# Patient Record
Sex: Male | Born: 1955 | Race: Black or African American | Hispanic: No | Marital: Married | Smoking: Former smoker
Health system: Southern US, Community
[De-identification: ages and names within clinical notes are randomized; demographics above are authoritative.]

## PROBLEM LIST (undated history)

## (undated) DIAGNOSIS — K2270 Barrett's esophagus without dysplasia: Secondary | ICD-10-CM

## (undated) DIAGNOSIS — E559 Vitamin D deficiency, unspecified: Secondary | ICD-10-CM

## (undated) DIAGNOSIS — H269 Unspecified cataract: Secondary | ICD-10-CM

## (undated) DIAGNOSIS — F419 Anxiety disorder, unspecified: Secondary | ICD-10-CM

## (undated) DIAGNOSIS — R7303 Prediabetes: Secondary | ICD-10-CM

## (undated) DIAGNOSIS — T7840XA Allergy, unspecified, initial encounter: Secondary | ICD-10-CM

## (undated) DIAGNOSIS — I10 Essential (primary) hypertension: Secondary | ICD-10-CM

## (undated) DIAGNOSIS — Z8719 Personal history of other diseases of the digestive system: Secondary | ICD-10-CM

## (undated) DIAGNOSIS — J45909 Unspecified asthma, uncomplicated: Secondary | ICD-10-CM

## (undated) DIAGNOSIS — M109 Gout, unspecified: Secondary | ICD-10-CM

## (undated) DIAGNOSIS — E785 Hyperlipidemia, unspecified: Secondary | ICD-10-CM

## (undated) DIAGNOSIS — K219 Gastro-esophageal reflux disease without esophagitis: Secondary | ICD-10-CM

## (undated) DIAGNOSIS — G4730 Sleep apnea, unspecified: Secondary | ICD-10-CM

## (undated) HISTORY — DX: Unspecified asthma, uncomplicated: J45.909

## (undated) HISTORY — DX: Allergy, unspecified, initial encounter: T78.40XA

## (undated) HISTORY — PX: OTHER SURGICAL HISTORY: SHX169

## (undated) HISTORY — DX: Gastro-esophageal reflux disease without esophagitis: K21.9

## (undated) HISTORY — DX: Prediabetes: R73.03

## (undated) HISTORY — DX: Vitamin D deficiency, unspecified: E55.9

## (undated) HISTORY — DX: Unspecified cataract: H26.9

## (undated) HISTORY — PX: UPPER GASTROINTESTINAL ENDOSCOPY: SHX188

## (undated) HISTORY — DX: Hyperlipidemia, unspecified: E78.5

## (undated) HISTORY — DX: Gout, unspecified: M10.9

## (undated) HISTORY — DX: Essential (primary) hypertension: I10

## (undated) HISTORY — DX: Barrett's esophagus without dysplasia: K22.70

## (undated) HISTORY — DX: Sleep apnea, unspecified: G47.30

## (undated) HISTORY — PX: COLONOSCOPY: SHX174

---

## 1978-09-01 HISTORY — PX: HEMORRHOID SURGERY: SHX153

## 1998-02-22 ENCOUNTER — Ambulatory Visit (HOSPITAL_COMMUNITY): Admission: RE | Admit: 1998-02-22 | Discharge: 1998-02-22 | Payer: Self-pay | Admitting: Internal Medicine

## 1999-04-02 ENCOUNTER — Ambulatory Visit (HOSPITAL_COMMUNITY): Admission: RE | Admit: 1999-04-02 | Discharge: 1999-04-02 | Payer: Self-pay | Admitting: Internal Medicine

## 1999-04-02 ENCOUNTER — Encounter: Payer: Self-pay | Admitting: Internal Medicine

## 1999-06-06 ENCOUNTER — Ambulatory Visit (HOSPITAL_COMMUNITY): Admission: RE | Admit: 1999-06-06 | Discharge: 1999-06-06 | Payer: Self-pay | Admitting: Gastroenterology

## 2000-04-07 ENCOUNTER — Encounter: Payer: Self-pay | Admitting: Internal Medicine

## 2000-04-07 ENCOUNTER — Ambulatory Visit (HOSPITAL_COMMUNITY): Admission: RE | Admit: 2000-04-07 | Discharge: 2000-04-07 | Payer: Self-pay | Admitting: Internal Medicine

## 2002-04-18 ENCOUNTER — Ambulatory Visit (HOSPITAL_COMMUNITY): Admission: RE | Admit: 2002-04-18 | Discharge: 2002-04-18 | Payer: Self-pay | Admitting: Internal Medicine

## 2002-04-18 ENCOUNTER — Encounter: Payer: Self-pay | Admitting: Internal Medicine

## 2004-04-19 ENCOUNTER — Ambulatory Visit: Admission: RE | Admit: 2004-04-19 | Discharge: 2004-04-19 | Payer: Self-pay | Admitting: Internal Medicine

## 2005-04-25 ENCOUNTER — Ambulatory Visit (HOSPITAL_COMMUNITY): Admission: RE | Admit: 2005-04-25 | Discharge: 2005-04-25 | Payer: Self-pay | Admitting: Internal Medicine

## 2005-11-28 ENCOUNTER — Ambulatory Visit (HOSPITAL_BASED_OUTPATIENT_CLINIC_OR_DEPARTMENT_OTHER): Admission: RE | Admit: 2005-11-28 | Discharge: 2005-11-28 | Payer: Self-pay | Admitting: Urology

## 2005-11-28 ENCOUNTER — Encounter (INDEPENDENT_AMBULATORY_CARE_PROVIDER_SITE_OTHER): Payer: Self-pay | Admitting: Specialist

## 2005-12-16 ENCOUNTER — Ambulatory Visit: Payer: Self-pay | Admitting: Gastroenterology

## 2005-12-30 ENCOUNTER — Ambulatory Visit: Payer: Self-pay | Admitting: Gastroenterology

## 2006-01-09 ENCOUNTER — Encounter (INDEPENDENT_AMBULATORY_CARE_PROVIDER_SITE_OTHER): Payer: Self-pay | Admitting: Specialist

## 2006-01-09 ENCOUNTER — Ambulatory Visit: Payer: Self-pay | Admitting: Gastroenterology

## 2006-05-20 ENCOUNTER — Ambulatory Visit (HOSPITAL_COMMUNITY): Admission: RE | Admit: 2006-05-20 | Discharge: 2006-05-20 | Payer: Self-pay | Admitting: Internal Medicine

## 2007-09-01 ENCOUNTER — Ambulatory Visit (HOSPITAL_COMMUNITY): Admission: RE | Admit: 2007-09-01 | Discharge: 2007-09-01 | Payer: Self-pay | Admitting: Internal Medicine

## 2008-05-31 ENCOUNTER — Ambulatory Visit (HOSPITAL_COMMUNITY): Admission: RE | Admit: 2008-05-31 | Discharge: 2008-05-31 | Payer: Self-pay | Admitting: Internal Medicine

## 2009-01-16 ENCOUNTER — Encounter (INDEPENDENT_AMBULATORY_CARE_PROVIDER_SITE_OTHER): Payer: Self-pay | Admitting: *Deleted

## 2009-06-06 ENCOUNTER — Ambulatory Visit (HOSPITAL_COMMUNITY): Admission: RE | Admit: 2009-06-06 | Discharge: 2009-06-06 | Payer: Self-pay | Admitting: Internal Medicine

## 2009-06-18 ENCOUNTER — Ambulatory Visit: Payer: Self-pay | Admitting: Gastroenterology

## 2009-07-03 ENCOUNTER — Ambulatory Visit: Payer: Self-pay | Admitting: Gastroenterology

## 2009-07-03 ENCOUNTER — Encounter: Payer: Self-pay | Admitting: Gastroenterology

## 2009-07-04 ENCOUNTER — Encounter: Payer: Self-pay | Admitting: Gastroenterology

## 2009-07-06 ENCOUNTER — Ambulatory Visit: Payer: Self-pay | Admitting: Gastroenterology

## 2009-07-10 ENCOUNTER — Ambulatory Visit: Payer: Self-pay | Admitting: Gastroenterology

## 2009-07-10 ENCOUNTER — Encounter: Payer: Self-pay | Admitting: Gastroenterology

## 2009-07-11 ENCOUNTER — Encounter: Payer: Self-pay | Admitting: Gastroenterology

## 2010-06-20 ENCOUNTER — Ambulatory Visit (HOSPITAL_COMMUNITY): Admission: RE | Admit: 2010-06-20 | Discharge: 2010-06-20 | Payer: Self-pay | Admitting: Internal Medicine

## 2010-09-06 ENCOUNTER — Ambulatory Visit (HOSPITAL_COMMUNITY)
Admission: RE | Admit: 2010-09-06 | Discharge: 2010-09-06 | Payer: Self-pay | Source: Home / Self Care | Attending: Internal Medicine | Admitting: Internal Medicine

## 2011-01-17 NOTE — Op Note (Signed)
NAME:  Christopher [name], Christopher [name]               ACCOUNT NO.:  [id number]   MEDICAL RECORD NO.:  [id number]          PATIENT TYPE:  AMB   LOCATION:  [facility]                         FACILITY:  [facility]   PHYSICIAN:  Sigmund I. Patsi Sears, M.D.DATE OF BIRTH:  1956/08/10   DATE OF PROCEDURE:  11/28/2005  DATE OF DISCHARGE:                                 OPERATIVE REPORT   PREOPERATIVE DIAGNOSIS:  Elective sterilization.   POSTOPERATIVE DIAGNOSIS:  Elective sterilization.   OPERATION:  Vasectomy.   SURGEON:  Sigmund I. Patsi Sears, M.D.   ANESTHESIA:  General LMA.   PREPARATION:  After appropriate preanesthesia, the patient was brought to  the operating room, placed on the operating room in dorsal supine position  where general LMA anesthesia was introduced.  He remained in this position  where pubis was prepped with Betadine solutio and draped in usual fashion.   PROCEDURE:  Identification of the vas was accomplished bilaterally.  The  patient had a thickened scrotum with bilateral hydroceles, obscuring the  vas.  However, the vas was isolated high in the scrotum, and a 1/2-cm  incision was made bilaterally, subcutaneous tissue dissected, the vas  isolated bilaterally, doubly clamped and a portion removed using the  electrosurgical unit.  Each end was then ligated with 3-0 Vicryl suture.  The wound was closed in two layers with 3-0 Vicryl suture and 4-0 Vicryl  suture.  Dermabond was placed on the skin.  The patient tolerated procedure  well.  He was given pain medication. He was awakened in good condition,  taken recovery room and given pain medication.      Sigmund I. Patsi Sears, M.D.  Electronically Signed     SIT/MEDQ  D:  11/28/2005  T:  12/01/2005  Job:  [id number]

## 2011-07-23 ENCOUNTER — Ambulatory Visit (AMBULATORY_SURGERY_CENTER): Payer: 59 | Admitting: *Deleted

## 2011-07-23 VITALS — Ht 70.0 in | Wt 310.0 lb

## 2011-07-23 DIAGNOSIS — K2270 Barrett's esophagus without dysplasia: Secondary | ICD-10-CM

## 2011-07-28 ENCOUNTER — Encounter: Payer: Self-pay | Admitting: Gastroenterology

## 2011-08-08 ENCOUNTER — Ambulatory Visit (AMBULATORY_SURGERY_CENTER): Payer: 59 | Admitting: Gastroenterology

## 2011-08-08 ENCOUNTER — Encounter: Payer: Self-pay | Admitting: Gastroenterology

## 2011-08-08 VITALS — BP 120/81 | HR 78 | Temp 97.9°F | Resp 18 | Ht 70.0 in | Wt 310.0 lb

## 2011-08-08 DIAGNOSIS — K2270 Barrett's esophagus without dysplasia: Secondary | ICD-10-CM

## 2011-08-08 MED ORDER — SODIUM CHLORIDE 0.9 % IV SOLN: 500.0000 mL | INTRAVENOUS | Status: DC

## 2011-08-08 NOTE — Progress Notes (Signed)
Patient did not experience any of the following events: a burn prior to discharge; a fall within the facility; wrong site/side/patient/procedure/implant event; or a hospital transfer or hospital admission upon discharge from the facility. (G8907) Patient did not have preoperative order for IV antibiotic SSI prophylaxis. (G8918)  

## 2011-08-08 NOTE — Op Note (Signed)
[facility] [address] [place], [place]  [address]  ENDOSCOPY PROCEDURE REPORT  PATIENT:  Rachid, Parham  MR#:  [id number] BIRTHDATE:  12/11/1955, 55 yrs. old  GENDER:  male  ENDOSCOPIST:  Barbette Hair. Arlyce Dice, MD Referred by:  Lucky Cowboy, M.D.  PROCEDURE DATE:  08/08/2011 PROCEDURE:  EGD with biopsy, 43239 ASA CLASS:  Class I INDICATIONS:  h/o Barrett's Esophagus  MEDICATIONS:   These medications were titrated to patient response per physician's verbal order, Fentanyl 100 mcg IV, Versed 10 mg IV, glycopyrrolate (Robinal) 0.2 mg IV, 0.6cc simethancone 0.6 cc PO TOPICAL ANESTHETIC:  Cetacaine Spray  DESCRIPTION OF PROCEDURE:   After the risks and benefits of the procedure were explained, informed consent was obtained.  The LB GIF-H180 [id number] endoscope was introduced through the mouth and advanced to the third portion of the duodenum.  The instrument was slowly withdrawn as the mucosa was fully examined. <<PROCEDUREIMAGES>>  Barrett's esophagus was found at the gastroesophageal junction. Established Barrett's extending 2cm proximal to GE junction. Multiple biopsies were taken (see image7, image6, and image8). Duodenitis was found in the bulb of the duodenum. Bxs taken (see image4).  Mild gastritis was found in the antrum (see image3). There was a 3cm sliding hiatal herniaOtherwise the examination was normal (see image1 and image5).    Retroflexed views revealed no abnormalities.    The scope was then withdrawn from the patient and the procedure completed.  COMPLICATIONS:  None  ENDOSCOPIC IMPRESSION: 1) Barrett's esophagus at the gastroesophageal junction 2) Duodenitis in the bulb of duodenum 3) Mild gastritis in the antrum 4) Otherwise normal examination RECOMMENDATIONS: 1) continue PPI 2) Dr. Arlyce Dice will contact you after he has reviewed the biopsy report with followup recommendations  ______________________________ Barbette Hair. Arlyce Dice,  MD  CC:  n. eSIGNED:   Barbette Hair. Kaplan at 08/08/2011 04:21 PM  Jeralene Huff, [id number]

## 2011-08-08 NOTE — Patient Instructions (Signed)
Discharge instructions given with verbal understanding. Handouts on gastritis and a soft diet given. Resume previous medications.

## 2011-08-11 ENCOUNTER — Telehealth: Payer: Self-pay

## 2011-08-11 NOTE — Telephone Encounter (Signed)
Telephone number has been disconnected

## 2012-06-29 ENCOUNTER — Other Ambulatory Visit (HOSPITAL_COMMUNITY): Payer: Self-pay | Admitting: Internal Medicine

## 2012-06-29 ENCOUNTER — Ambulatory Visit (HOSPITAL_COMMUNITY)
Admission: RE | Admit: 2012-06-29 | Discharge: 2012-06-29 | Disposition: A | Discharge status: Home / Self Care | Payer: 59 | Source: Ambulatory Visit | Attending: Internal Medicine | Admitting: Internal Medicine

## 2012-06-29 DIAGNOSIS — F17200 Nicotine dependence, unspecified, uncomplicated: Secondary | ICD-10-CM | POA: Insufficient documentation

## 2012-06-29 DIAGNOSIS — I10 Essential (primary) hypertension: Secondary | ICD-10-CM

## 2012-08-16 ENCOUNTER — Encounter (HOSPITAL_COMMUNITY): Payer: Self-pay | Admitting: *Deleted

## 2012-08-16 ENCOUNTER — Emergency Department (HOSPITAL_COMMUNITY): Payer: 59

## 2012-08-16 ENCOUNTER — Observation Stay (HOSPITAL_COMMUNITY)
Admission: EM | Admit: 2012-08-16 | Discharge: 2012-08-17 | Disposition: A | Discharge status: Home / Self Care | Payer: 59 | Attending: Cardiovascular Disease | Admitting: Cardiovascular Disease

## 2012-08-16 DIAGNOSIS — Z87891 Personal history of nicotine dependence: Secondary | ICD-10-CM

## 2012-08-16 DIAGNOSIS — E785 Hyperlipidemia, unspecified: Secondary | ICD-10-CM | POA: Insufficient documentation

## 2012-08-16 DIAGNOSIS — I10 Essential (primary) hypertension: Secondary | ICD-10-CM | POA: Insufficient documentation

## 2012-08-16 DIAGNOSIS — R079 Chest pain, unspecified: Secondary | ICD-10-CM

## 2012-08-16 DIAGNOSIS — M7989 Other specified soft tissue disorders: Secondary | ICD-10-CM | POA: Insufficient documentation

## 2012-08-16 DIAGNOSIS — R072 Precordial pain: Secondary | ICD-10-CM

## 2012-08-16 DIAGNOSIS — E782 Mixed hyperlipidemia: Secondary | ICD-10-CM

## 2012-08-16 DIAGNOSIS — K219 Gastro-esophageal reflux disease without esophagitis: Secondary | ICD-10-CM

## 2012-08-16 DIAGNOSIS — R0789 Other chest pain: Principal | ICD-10-CM | POA: Insufficient documentation

## 2012-08-16 DIAGNOSIS — F17200 Nicotine dependence, unspecified, uncomplicated: Secondary | ICD-10-CM | POA: Insufficient documentation

## 2012-08-16 HISTORY — DX: Personal history of other diseases of the digestive system: Z87.19

## 2012-08-16 LAB — TROPONIN I: Troponin I: <0.3 ng/mL (ref <0.30)

## 2012-08-16 LAB — COMPREHENSIVE METABOLIC PANEL
ALT: 30 U/L (ref 0–53)
AST: 24 U/L (ref 0–37)
Albumin: 4.4 g/dL (ref 3.5–5.2)
Calcium: 9.9 mg/dL (ref 8.4–10.5)
GFR calc Af Amer: 89 mL/min — ABNORMAL LOW (ref >90)
Sodium: 138 mEq/L (ref 135–145)
Total Protein: 7.7 g/dL (ref 6.0–8.3)

## 2012-08-16 LAB — CBC WITH DIFFERENTIAL/PLATELET
Basophils Absolute: 0 10*3/uL (ref 0.0–0.1)
Basophils Relative: 0 % (ref 0–1)
Eosinophils Absolute: 0.1 10*3/uL (ref 0.0–0.7)
Eosinophils Relative: 2 % (ref 0–5)
MCH: 31.8 pg (ref 26.0–34.0)
MCV: 89.6 fL (ref 78.0–100.0)
Neutrophils Relative %: 51 % (ref 43–77)
Platelets: 133 10*3/uL — ABNORMAL LOW (ref 150–400)
RDW: 12.8 % (ref 11.5–15.5)

## 2012-08-16 MED ORDER — HEPARIN (PORCINE) IN NACL 100-0.45 UNIT/ML-% IJ SOLN
1800.0000 [IU]/h | INTRAMUSCULAR | Status: DC
  Administered 2012-08-16: 1000 [IU]/h via INTRAVENOUS
  Filled 2012-08-16 (×3): qty 250

## 2012-08-16 MED ORDER — IRBESARTAN 150 MG PO TABS
150.0000 mg | ORAL_TABLET | Freq: Every day | ORAL | Status: DC
  Filled 2012-08-16: qty 1

## 2012-08-16 MED ORDER — VITAMIN D (ERGOCALCIFEROL) 1.25 MG (50000 UNIT) PO CAPS
50000.0000 [IU] | ORAL_CAPSULE | Freq: Every day | ORAL | Status: DC
  Filled 2012-08-16: qty 1

## 2012-08-16 MED ORDER — ASPIRIN 81 MG PO CHEW: 81.0000 mg | CHEWABLE_TABLET | Freq: Every day | ORAL | Status: DC

## 2012-08-16 MED ORDER — HYDROCHLOROTHIAZIDE 25 MG PO TABS
25.0000 mg | ORAL_TABLET | Freq: Every day | ORAL | Status: DC
  Filled 2012-08-16: qty 1

## 2012-08-16 MED ORDER — VALSARTAN-HYDROCHLOROTHIAZIDE 160-25 MG PO TABS: 1.0000 | ORAL_TABLET | Freq: Every day | ORAL | Status: DC

## 2012-08-16 MED ORDER — ONDANSETRON HCL 4 MG/2ML IJ SOLN: 4.0000 mg | Freq: Four times a day (QID) | INTRAMUSCULAR | Status: DC | PRN

## 2012-08-16 MED ORDER — VITAMIN C 500 MG PO TABS
500.0000 mg | ORAL_TABLET | Freq: Three times a day (TID) | ORAL | Status: DC
  Filled 2012-08-16 (×4): qty 1

## 2012-08-16 MED ORDER — ASPIRIN EC 81 MG PO TBEC
81.0000 mg | DELAYED_RELEASE_TABLET | Freq: Every day | ORAL | Status: DC
  Filled 2012-08-16: qty 1

## 2012-08-16 MED ORDER — FUROSEMIDE 20 MG PO TABS
10.0000 mg | ORAL_TABLET | Freq: Two times a day (BID) | ORAL | Status: DC | PRN
  Filled 2012-08-16: qty 0.5

## 2012-08-16 MED ORDER — METOPROLOL TARTRATE 25 MG PO TABS
25.0000 mg | ORAL_TABLET | Freq: Two times a day (BID) | ORAL | Status: DC
  Administered 2012-08-16: 25 mg via ORAL
  Filled 2012-08-16 (×3): qty 1

## 2012-08-16 MED ORDER — ASPIRIN 300 MG RE SUPP
300.0000 mg | RECTAL | Status: DC
  Filled 2012-08-16: qty 1

## 2012-08-16 MED ORDER — ASPIRIN 81 MG PO CHEW
324.0000 mg | CHEWABLE_TABLET | Freq: Once | ORAL | Status: AC
  Administered 2012-08-16: 324 mg via ORAL
  Filled 2012-08-16: qty 4

## 2012-08-16 MED ORDER — ATORVASTATIN CALCIUM 80 MG PO TABS
80.0000 mg | ORAL_TABLET | Freq: Every day | ORAL | Status: DC
  Filled 2012-08-16: qty 1

## 2012-08-16 MED ORDER — ACETAMINOPHEN 325 MG PO TABS: 650.0000 mg | ORAL_TABLET | ORAL | Status: DC | PRN

## 2012-08-16 MED ORDER — ALLOPURINOL 100 MG PO TABS
100.0000 mg | ORAL_TABLET | Freq: Every day | ORAL | Status: DC
  Filled 2012-08-16 (×2): qty 1

## 2012-08-16 MED ORDER — PANTOPRAZOLE SODIUM 40 MG PO TBEC: 40.0000 mg | DELAYED_RELEASE_TABLET | Freq: Every day | ORAL | Status: DC

## 2012-08-16 MED ORDER — MAGNESIUM OXIDE 400 (241.3 MG) MG PO TABS
400.0000 mg | ORAL_TABLET | Freq: Every day | ORAL | Status: DC
Start: 2012-08-17 — End: 2012-08-17
  Filled 2012-08-16: qty 1

## 2012-08-16 MED ORDER — NITROGLYCERIN 2 % TD OINT
0.5000 [in_us] | TOPICAL_OINTMENT | Freq: Once | TRANSDERMAL | Status: AC
  Administered 2012-08-16: 0.5 [in_us] via TOPICAL
  Filled 2012-08-16: qty 1

## 2012-08-16 MED ORDER — NITROGLYCERIN 0.4 MG SL SUBL: 0.4000 mg | SUBLINGUAL_TABLET | SUBLINGUAL | Status: DC | PRN

## 2012-08-16 MED ORDER — ASPIRIN 81 MG PO TABS: 81.0000 mg | ORAL_TABLET | Freq: Every day | ORAL | Status: DC

## 2012-08-16 MED ORDER — HEPARIN BOLUS VIA INFUSION
4000.0000 [IU] | Freq: Once | INTRAVENOUS | Status: AC
  Administered 2012-08-16: 4000 [IU] via INTRAVENOUS
  Filled 2012-08-16: qty 4000

## 2012-08-16 MED ORDER — ASPIRIN 81 MG PO CHEW: 324.0000 mg | CHEWABLE_TABLET | ORAL | Status: DC

## 2012-08-16 MED ORDER — SUCRALFATE 1 G PO TABS
1.0000 g | ORAL_TABLET | Freq: Four times a day (QID) | ORAL | Status: DC
  Filled 2012-08-16 (×6): qty 1

## 2012-08-16 MED ORDER — BUPROPION HCL ER (XL) 150 MG PO TB24
150.0000 mg | ORAL_TABLET | Freq: Every day | ORAL | Status: DC
  Filled 2012-08-16: qty 1

## 2012-08-16 NOTE — H&P (Signed)
ADMISSION HISTORY AND PHYSICAL   Date: 08/16/2012               Patient Name:  Christopher [name] MRN: [id number]  DOB: Jun 28, 1956 Age / Sex: 56 y.o., male        PCP: MCKEOWN,WILLIAM DAVID Primary Cardiologist: New to [place]         History of Present Illness: Patient is a 56 y.o. male with a PMHx of HTN, GERD, HTN, who was admitted to [facility] on 08/16/2012 for evaluation of  Chest pressure / heaviness  Pain / heaviness / pressure started last night at home.   Lasted several hours.  Awoke at 3 AM still present, then the pain waxed and waned.  He went to work, climbed some stairs, and the pain worsened.  No changed with eating or drinking.  Possibly worse with deep breath.    Works in Patent examiner ( Development worker, community at [employer])   He's had intermittent chest pain off and on through the day. He had chest tightness and pressure approximately 45 minutes before I arrived. His EKG remains normal.   Medications: Outpatient medications: No current facility-administered medications for this encounter.   Current Outpatient Prescriptions  Medication Sig Dispense Refill  . Ascorbic Acid (VITAMIN C PO) Take 1 tablet by mouth 3 (three) times daily.        Marland Kitchen aspirin 81 MG tablet Take 81 mg by mouth daily.        . BuPROPion HCl (WELLBUTRIN PO) Take 1 tablet by mouth daily.        . Cholecalciferol (VITAMIN D PO) Take by mouth. Takes 1 po daily, six days a week        . Cyanocobalamin (VITAMIN B 12 PO) Take 1 tablet by mouth daily.        Marland Kitchen esomeprazole (NEXIUM) 40 MG capsule Take 40 mg by mouth 2 (two) times daily.        . fish oil-omega-3 fatty acids 1000 MG capsule Take 2 g by mouth daily.        . FUROSEMIDE PO Take 1 tablet by mouth as needed.       Marland Kitchen MAGNESIUM PO Take 1 tablet by mouth daily.      . Multiple Vitamin (MULTIVITAMIN PO) Take 1 tablet by mouth daily.        . rosuvastatin (CRESTOR) 20 MG tablet Take 20 mg by mouth daily.        . sucralfate (CARAFATE) 1 G  tablet Take 1 g by mouth 4 (four) times daily.        . Valsartan (DIOVAN PO) Take 1 tablet by mouth daily.         Diovan / HCTZ 160 / 25 mg a day.   No Known Allergies   Past Medical History  Diagnosis Date  . Allergy   . GERD (gastroesophageal reflux disease)   . Hyperlipidemia   . Hypertension     Past Surgical History  Procedure Date  . Upper gastrointestinal endoscopy   . Colonoscopy     Family History  Problem Relation Age of Onset  . Esophageal cancer Neg Hx   . Rectal cancer Neg Hx   . Stomach cancer Neg Hx   . Colon cancer Maternal Uncle     Social History: Smokes 3 cigarettes a day. Drinks alcohol rarely    Review of Systems: Constitutional:  denies fever, chills, diaphoresis, appetite change and fatigue.  HEENT: denies photophobia, eye pain,  redness, hearing loss, ear pain, congestion, sore throat, rhinorrhea, sneezing, neck pain, neck stiffness and tinnitus.  Respiratory: reports occasional wheezing.  Cardiovascular: admits to chest pain,   Gastrointestinal: denies nausea, vomiting, abdominal pain, diarrhea, constipation, blood in stool.  Genitourinary: denies dysuria, urgency, frequency, hematuria, flank pain and difficulty urinating.  Musculoskeletal: denies  myalgias, back pain, joint swelling, arthralgias and gait problem.   Skin: denies pallor, rash and wound.  Neurological: denies dizziness, seizures, syncope, weakness, light-headedness, numbness and headaches.   Hematological: denies adenopathy, easy bruising, personal or family bleeding history.  Psychiatric/ Behavioral: denies suicidal ideation, mood changes, confusion, nervousness, sleep disturbance and agitation.    Physical Exam: BP 111/77  Pulse 95  Temp 98.5 F (36.9 C) (Oral)  Resp 16  SpO2 99%  General: Vital signs reviewed and noted. Well-developed, well-nourished, in no acute distress; alert, appropriate and cooperative throughout examination.  Head: Normocephalic, atraumatic,  sclera anicteric, mucus membranes are moist  Neck: Supple. Negative for carotid bruits. JVD not elevated.  Lungs:  Clear bilaterally to auscultation without wheezes, rales, or rhonchi. Breathing is normal  Heart: RRR with S1 S2. No murmurs, rubs, or gallops   Abdomen:  Soft, non-tender, non-distended with normoactive bowel sounds. No hepatomegaly. No rebound/guarding. No obvious abdominal masses  MSK: Strength and the appear normal for age.  Extremities: No clubbing or cyanosis. 1+  edema.  Distal pedal pulses are 2+ and equal bilaterally.  Neurologic: Alert and oriented X 3. Moves all extremities spontaneously  Psych:  Responds to questions appropriately with a normal affect.    Lab results: Basic Metabolic Panel:  Lab 08/16/12 1478  NA 138  K 3.6  CL 99  CO2 26  GLUCOSE 102*  BUN 17  CREATININE 1.07  CALCIUM 9.9  MG --  PHOS --    Liver Function Tests:  Lab 08/16/12 1046  AST 24  ALT 30  ALKPHOS 58  BILITOT 0.6  PROT 7.7  ALBUMIN 4.4   No results found for this basename: LIPASE:3,AMYLASE:3 in the last 168 hours  CBC:  Lab 08/16/12 1046  WBC 7.8  NEUTROABS 4.0  HGB 14.1  HCT 39.7  MCV 89.6  PLT 133*    Cardiac Enzymes: No results found for this basename: CKTOTAL:5,CKMB:5,CKMBINDEX:5,TROPONINI:5 in the last 168 hours  BNP: No components found with this basename: POCBNP:3  CBG: No results found for this basename: GLUCAP:5 in the last 168 hours  Coagulation Studies: No results found for this basename: LABPROT:3,INR:3 in the last 72 hours   Other results:  EKG  :  NSR, no ST or T wave changes.   Imaging: Dg Chest Port 1 View  08/16/2012  *RADIOLOGY REPORT*  Clinical Data: Chest pressure since earlier today.  PORTABLE CHEST - 1 VIEW  Comparison: 06/29/2012  Findings: The lungs are clear and fully expanded.  No effusions or pneumothoraces.  The heart and mediastinal structures are normal. The bony thorax is intact.  IMPRESSION: No active disease.    Original Report Authenticated By: Sander Radon, M.D.       Assessment & Plan:  1. Chest tightness / Unstable angina:  The patient presents with intermittent episodes of chest tightness.  His EKG remains normal his troponin levels are normal.  Our best course of action at this point would be to admit him to the hospital and collect cardiac enzymes. We'll anticipate doing a stress Myoview study tomorrow.  He has a long history of cigarette smoking. He has  slowed down his smoking some  but still smokes a few cigarettes a day.  Has a history of hypertension and hyperlipidemia. Will start IV heparin  2. Hypertension:  his blood pressure is fairly well controlled. We'll continue with his same blood pressure medications. We may need to add a low-dose metoprolol.  3. Hyperlipidemia:    we will check fasting lipids tomorrow.  DVT PPX -    Alvia Grove., MD, [facility] 08/16/2012, 4:38 PM

## 2012-08-16 NOTE — ED Notes (Signed)
To ED for eval of crushing CP. States this pain started last pm. Pain is described as intermittent. Nothing worse or better. No vomiting, only midsternal pressure.

## 2012-08-16 NOTE — Progress Notes (Signed)
ANTICOAGULATION CONSULT NOTE - Initial Consult  Pharmacy Consult for Heparin Indication: chest pain/ACS  No Known Allergies  Patient Measurements: Height: 6' (182.9 cm) IBW/kg (Calculated) : 77.6  TBW ~ 140.6 KG Heparin Dosing Weight: = 97 KG  Vital Signs: Temp: 98.4 F (36.9 C) (12/16 1841) Temp src: Oral (12/16 1841) BP: 115/82 mmHg (12/16 1841) Pulse Rate: 85  (12/16 1841)  Labs:  Basename 08/16/12 1046  HGB 14.1  HCT 39.7  PLT 133*  APTT --  LABPROT --  INR --  HEPARINUNFRC --  CREATININE 1.07  CKTOTAL --  CKMB --  TROPONINI --   Medical History: Past Medical History  Diagnosis Date  . Allergy   . GERD (gastroesophageal reflux disease)   . Hyperlipidemia   . Hypertension    Medications:  Scheduled:    . allopurinol  100 mg Oral Daily  . [COMPLETED] aspirin  324 mg Oral Once  . aspirin  324 mg Oral NOW   Or  . aspirin  300 mg Rectal NOW  . aspirin  81 mg Oral Daily  . aspirin EC  81 mg Oral Daily  . atorvastatin  80 mg Oral q1800  . buPROPion  150 mg Oral Daily  . hydrochlorothiazide  25 mg Oral Daily  . irbesartan  150 mg Oral Daily  . magnesium oxide  400 mg Oral Daily  . metoprolol tartrate  25 mg Oral BID  . pantoprazole  40 mg Oral Daily  . sucralfate  1 g Oral QID  . vitamin C  500 mg Oral TID  . Vitamin D (Ergocalciferol)  50,000 Units Oral Daily    Assessment: 56yo male admitted with complaints of chest pressure/heaviness.  We have been asked to initiate IV Heparin for unstable angina/chest tightness.  His labs are unremarkable except the his platelets are just below desired goal range of 150 or higher at 133.  He is presently without any noted bleeding complications nor does he have a history significant for bleeding either.  Goal of Therapy:  Heparin level 0.3-0.7 units/ml Monitor platelets by anticoagulation protocol: Yes   Plan:   Will begin IV heparin at 4000 units IV bolus x 1 and start a drip at 1000 units/hr.  Will obtain  a 6 hour level and adjust dosing as needed.    Monitor for bleeding complications.  Nadara Mustard, PharmD., MS Clinical Pharmacist Pager:  [phone] Thank you for allowing pharmacy to be part of this patients care team.  08/16/2012,7:05 PM

## 2012-08-16 NOTE — ED Provider Notes (Signed)
History     CSN: [id number]  Arrival date & time 08/16/12  1002   First MD Initiated Contact with Patient 08/16/12 1006      Chief Complaint  Patient presents with  . Chest Pain    (Consider location/radiation/quality/duration/timing/severity/associated sxs/prior treatment) HPI Pt with central chest pressure that woke him form sleep at 0300 and eased off after several min. Pt went to work and was climbing stairs when he began to have central chest pressure that does not radiate. No SOB, N/V. Since pt has been lying on bed, pressure has eased. No prev cardiac history. No Fam Hx. +smoking and hx of HTN. No recent travel or surgeries.  Past Medical History  Diagnosis Date  . Allergy   . GERD (gastroesophageal reflux disease)   . Hyperlipidemia   . Hypertension     Past Surgical History  Procedure Date  . Upper gastrointestinal endoscopy   . Colonoscopy     Family History  Problem Relation Age of Onset  . Esophageal cancer Neg Hx   . Rectal cancer Neg Hx   . Stomach cancer Neg Hx   . Colon cancer Maternal Uncle     History  Substance Use Topics  . Smoking status: Former Games developer  . Smokeless tobacco: Not on file  . Alcohol Use: No      Review of Systems  Constitutional: Negative for fever and chills.  Respiratory: Negative for shortness of breath and wheezing.   Cardiovascular: Positive for chest pain and leg swelling. Negative for palpitations.  Gastrointestinal: Negative for nausea, vomiting and abdominal pain.  Musculoskeletal: Negative for back pain.  Skin: Negative for rash and wound.  Neurological: Negative for dizziness, weakness, light-headedness and numbness.    Allergies  Review of patient's allergies indicates no known allergies.  Home Medications   No current outpatient prescriptions on file.  BP 118/81  Pulse 71  Temp 98.1 F (36.7 C) (Oral)  Resp 18  Ht 6' (1.829 m)  Wt 301 lb 11.2 oz (136.85 kg)  BMI 40.92 kg/m2  SpO2 92%  Physical  Exam  Nursing note and vitals reviewed. Constitutional: He is oriented to person, place, and time. He appears well-developed and well-nourished. No distress.  HENT:  Head: Normocephalic and atraumatic.  Mouth/Throat: Oropharynx is clear and moist.  Eyes: EOM are normal. Pupils are equal, round, and reactive to light.  Neck: Normal range of motion. Neck supple.  Cardiovascular: Normal rate and regular rhythm.   Pulmonary/Chest: Effort normal and breath sounds normal. No respiratory distress. He has no wheezes. He has no rales. He exhibits no tenderness.  Abdominal: Soft. Bowel sounds are normal. He exhibits no distension and no mass. There is no tenderness. There is no rebound and no guarding.  Musculoskeletal: Normal range of motion. He exhibits edema (Mild bl lower ext swelling). He exhibits no tenderness.  Neurological: He is alert and oriented to person, place, and time.       Moves all ext without deficit  Skin: Skin is warm and dry. No rash noted. No erythema.  Psychiatric: He has a normal mood and affect. His behavior is normal.    ED Course  Procedures (including critical care time)  Labs Reviewed  CBC WITH DIFFERENTIAL - Abnormal; Notable for the following:    Platelets 133 (*)     All other components within normal limits  COMPREHENSIVE METABOLIC PANEL - Abnormal; Notable for the following:    Glucose, Bld 102 (*)     GFR calc  non Af Amer 77 (*)     GFR calc Af Amer 89 (*)     All other components within normal limits  HEPARIN LEVEL (UNFRACTIONATED) - Abnormal; Notable for the following:    Heparin Unfractionated <0.10 (*)     All other components within normal limits  POCT I-STAT TROPONIN I  TROPONIN I  TROPONIN I  TSH  HEPARIN LEVEL (UNFRACTIONATED)  CBC  TROPONIN I  LIPID PANEL   Dg Chest Port 1 View  08/16/2012  *RADIOLOGY REPORT*  Clinical Data: Chest pressure since earlier today.  PORTABLE CHEST - 1 VIEW  Comparison: 06/29/2012  Findings: The lungs are clear  and fully expanded.  No effusions or pneumothoraces.  The heart and mediastinal structures are normal. The bony thorax is intact.  IMPRESSION: No active disease.   Original Report Authenticated By: Sander Radon, M.D.      1. Chest heaviness      Date: 08/16/2012  Rate: 80  Rhythm: normal sinus rhythm  QRS Axis: normal  Intervals: normal  ST/T Wave abnormalities: normal  Conduction Disutrbances:none  Narrative Interpretation:   Old EKG Reviewed: none available    MDM   Cardiology will see and disposition       Loren Racer, MD 08/17/12 [phone]

## 2012-08-17 ENCOUNTER — Encounter (HOSPITAL_COMMUNITY): Payer: Self-pay | Admitting: General Practice

## 2012-08-17 DIAGNOSIS — E782 Mixed hyperlipidemia: Secondary | ICD-10-CM

## 2012-08-17 DIAGNOSIS — I10 Essential (primary) hypertension: Secondary | ICD-10-CM

## 2012-08-17 DIAGNOSIS — K219 Gastro-esophageal reflux disease without esophagitis: Secondary | ICD-10-CM

## 2012-08-17 DIAGNOSIS — Z87891 Personal history of nicotine dependence: Secondary | ICD-10-CM

## 2012-08-17 LAB — CBC
MCV: 89.2 fL (ref 78.0–100.0)
Platelets: 122 10*3/uL — ABNORMAL LOW (ref 150–400)
RBC: 4.44 MIL/uL (ref 4.22–5.81)
WBC: 8.6 10*3/uL (ref 4.0–10.5)

## 2012-08-17 LAB — TROPONIN I
Troponin I: <0.3 ng/mL (ref <0.30)
Troponin I: <0.3 ng/mL (ref <0.30)

## 2012-08-17 LAB — LIPID PANEL
HDL: 40 mg/dL (ref >39)
LDL Cholesterol: 132 mg/dL — ABNORMAL HIGH (ref 0–99)
Triglycerides: 90 mg/dL (ref <150)

## 2012-08-17 LAB — TSH: TSH: 2.415 u[IU]/mL (ref 0.350–4.500)

## 2012-08-17 LAB — HEPARIN LEVEL (UNFRACTIONATED)
Heparin Unfractionated: <0.1 IU/mL — ABNORMAL LOW (ref 0.30–0.70)
Heparin Unfractionated: <0.1 IU/mL — ABNORMAL LOW (ref 0.30–0.70)

## 2012-08-17 MED ORDER — HEPARIN BOLUS VIA INFUSION
3000.0000 [IU] | Freq: Once | INTRAVENOUS | Status: AC
  Administered 2012-08-17: 3000 [IU] via INTRAVENOUS
  Filled 2012-08-17: qty 3000

## 2012-08-17 MED ORDER — METOPROLOL TARTRATE 25 MG PO TABS: 25.0000 mg | ORAL_TABLET | Freq: Two times a day (BID) | ORAL | Status: DC

## 2012-08-17 MED ORDER — NITROGLYCERIN 0.4 MG SL SUBL: 0.4000 mg | SUBLINGUAL_TABLET | SUBLINGUAL | Status: DC | PRN

## 2012-08-17 NOTE — Progress Notes (Signed)
Patient Name: Christopher [name] Date of Encounter: 08/17/2012     Principal Problem:  *Substernal chest pain Active Problems:  Essential hypertension  Hyperlipidemia  Tobacco abuse  GERD (gastroesophageal reflux disease)    SUBJECTIVE: Feels well. No chest pain, SOB, n/v, diaphoresis, lightheadedness or palpitations.   OBJECTIVE  Filed Vitals:   08/16/12 1755 08/16/12 1841 08/16/12 2014 08/17/12 0500  BP: 121/81 115/82 115/74 118/81  Pulse: 81 85 88 71  Temp: 97.9 F (36.6 C) 98.4 F (36.9 C) 98 F (36.7 C) 98.1 F (36.7 C)  TempSrc: Oral Oral Oral   Resp: 15 20  18   Height:  6' (1.829 m)    Weight:    136.85 kg (301 lb 11.2 oz)  SpO2: 97% 93% 94% 92%    Intake/Output Summary (Last 24 hours) at 08/17/12 0933 Last data filed at 08/16/12 1254  Gross per 24 hour  Intake      0 ml  Output   1550 ml  Net  -1550 ml   Weight change:   PHYSICAL EXAM  General: Obese, well developed, in no acute distress. Head: Normocephalic, atraumatic, sclera non-icteric, no xanthomas, nares are without discharge.  Neck: Supple without bruits or JVD. Lungs:  Resp regular and unlabored, CTA. Heart: RRR no s3, s4, or murmurs. Abdomen: Soft, non-tender, non-distended, BS + x 4.  Msk:  Strength and tone appears normal for age. Extremities: No clubbing, cyanosis or edema. DP/PT/Radials 2+ and equal bilaterally. Neuro: Alert and oriented X 3. Moves all extremities spontaneously. Psych: Normal affect.  LABS:  Recent Labs  [facility] 08/16/12 1046   WBC 7.8   HGB 14.1   HCT 39.7   MCV 89.6   PLT 133*    Lab 08/16/12 1046  NA 138  K 3.6  CL 99  CO2 26  BUN 17  CREATININE 1.07  CALCIUM 9.9  PROT 7.7  BILITOT 0.6  ALKPHOS 58  ALT 30  AST 24  AMYLASE --  LIPASE --  GLUCOSE 102*   Recent Labs  Basename 08/17/12 0052 08/16/12 1906   CKTOTAL -- --   CKMB -- --   CKMBINDEX -- --   TROPONINI <0.30 <0.30    Basename 08/16/12 1905  TSH 2.415  T4TOTAL --  T3FREE --    THYROIDAB --    Radiology/Studies:  Dg Chest Port 1 View  08/16/2012  *RADIOLOGY REPORT*  Clinical Data: Chest pressure since earlier today.  PORTABLE CHEST - 1 VIEW  Comparison: 06/29/2012  Findings: The lungs are clear and fully expanded.  No effusions or pneumothoraces.  The heart and mediastinal structures are normal. The bony thorax is intact.  IMPRESSION: No active disease.   Original Report Authenticated By: Sander Radon, M.D.     Current Medications:     . allopurinol  100 mg Oral Daily  . aspirin  324 mg Oral NOW   Or  . aspirin  300 mg Rectal NOW  . aspirin  81 mg Oral Daily  . aspirin EC  81 mg Oral Daily  . atorvastatin  80 mg Oral q1800  . buPROPion  150 mg Oral Daily  . hydrochlorothiazide  25 mg Oral Daily  . irbesartan  150 mg Oral Daily  . magnesium oxide  400 mg Oral Daily  . metoprolol tartrate  25 mg Oral BID  . pantoprazole  40 mg Oral Daily  . sucralfate  1 g Oral QID  . vitamin C  500 mg Oral TID  . Vitamin  D (Ergocalciferol)  50,000 Units Oral Daily    ASSESSMENT AND PLAN:  56yo AA male with HTN, HLD, tobacco abuse and obesity observed overnight for intermittent, exertional chest heaviness the day of admission. No prior episodes of chest pain. No cardiac history.   1. Substernal chest pain 2. Hypertension 3. Hyperlipidemia 4. Obesity 5. Ongoing tobacco abuse 6. GERD  He has effectively ruled out overnight and is asymptomatic. Will discontinue heparin. Plan was for Myoview today, however given obesity, would have to be two-day study. He refuses to stay for two days. Will plan two-day outpatient Myoview. He understands to call the office or promptly return to the ED should his chest discomfort return. Continue low-dose ASA, ARB, BB, statin, NTG SL PRN. Continue other outpatient antihypertensives and PPI. Stressed smoking cessation. Continue buproprion. Follow-up with PCP for this and GERD. Will schedule Myoview and follow-up in the office.  Discharge today.   Signed, R. Hurman Horn, PA-C 08/17/2012, 9:33 AM  Attending Note:   The patient was seen and examined.  Agree with assessment and plan as noted above.  I have made arrangements for him to go to our office tomorrow to have a 2 day study.  We will give him NTG and ASA.  Instructed him about when to use NTG.   He is to call if he has any further CP.   Home today after walking several laps around the floor.  Will see him in several weeks.  He may see Lawson Fiscal in the meanwhile.  Dx.  Chest tightness - enzymes and ECG are normal.  Could be GERD, may be CAD  .  HTN GERD   Alvia Grove., MD, [facility] 08/17/2012, 10:45 AM

## 2012-08-17 NOTE — Progress Notes (Signed)
ANTICOAGULATION CONSULT NOTE   Pharmacy Consult for Heparin Indication: chest pain/ACS  No Known Allergies  Patient Measurements: Height: 6' (182.9 cm) IBW/kg (Calculated) : 77.6  TBW ~ 140.6 KG Heparin Dosing Weight: = 110 KG  Vital Signs: Temp: 98 F (36.7 C) (12/16 2014) Temp src: Oral (12/16 2014) BP: 115/74 mmHg (12/16 2014) Pulse Rate: 88  (12/16 2014)  Labs:  Basename 08/17/12 0118 08/17/12 0052 08/16/12 1906 08/16/12 1046  HGB -- -- -- 14.1  HCT -- -- -- 39.7  PLT -- -- -- 133*  APTT -- -- -- --  LABPROT -- -- -- --  INR -- -- -- --  HEPARINUNFRC <0.10* -- -- --  CREATININE -- -- -- 1.07  CKTOTAL -- -- -- --  CKMB -- -- -- --  TROPONINI -- <0.30 <0.30 --   Assessment: 56 yo male with chest pain for Heparin  Goal of Therapy:  Heparin level 0.3-0.7 units/ml Monitor platelets by anticoagulation protocol: Yes   Plan:  Heparin 3000 units IV bolus, then increase heparin 1800 units/hr Check heparin level in 6 hours.  Geannie Risen, PharmD, BCPS  08/17/2012,3:12 AM

## 2012-08-17 NOTE — Care Management Note (Signed)
    Page 1 of 1   08/17/2012     10:37:28 AM   CARE MANAGEMENT NOTE 08/17/2012  Patient:  Christopher [name], Christopher [name]   Account Number:  [id number]  Date Initiated:  08/17/2012  Documentation initiated by:  Donn Pierini  Subjective/Objective Assessment:   Pt admitted with chest pain     Action/Plan:   PTA pt lived at home- independent- plan to d/c today with outpt f/u-   Anticipated DC Date:  08/17/2012   Anticipated DC Plan:  HOME/SELF CARE      DC Planning Services  CM consult      Choice offered to / List presented to:             Status of service:  Completed, signed off Medicare Important Message given?   (If response is "NO", the following Medicare IM given date fields will be blank) Date Medicare IM given:   Date Additional Medicare IM given:    Discharge Disposition:  HOME/SELF CARE  Per UR Regulation:  Reviewed for med. necessity/level of care/duration of stay  If discussed at Long Length of Stay Meetings, dates discussed:    Comments:

## 2012-08-17 NOTE — Discharge Summary (Signed)
Discharge Summary   Patient ID: Christopher [name],  MRN: [id number], DOB/AGE: [birth year] 56 y.o.  Admit date: 08/16/2012 Discharge date: 08/17/2012  Primary Physician: Nadean Corwin, MD Primary Cardiologist: New to cardiology - seen in consultation by Delane Ginger, MD  Discharge Diagnoses Principal Problem:  *Substernal chest pain  - typical and atypical features, multiple cardiac risk factors  - ruled out overnight (TnI within normal limits x 3), EKG without ischemic changes  - plan outpatient Lexiscan Myoview on 12/18 and 12/19 (two-day study) for further ischemic evaluation  - discharged on NTG SL PRN and BB, to continue low-dose ASA Active Problems:  Essential hypertension  Hyperlipidemia  Tobacco abuse  GERD (gastroesophageal reflux disease)  Allergies No Known Allergies  Diagnostic Studies/Procedures  PORTABLE CHEST - 1 VIEW  Comparison: 06/29/2012  Findings: The lungs are clear and fully expanded. No effusions or  pneumothoraces. The heart and mediastinal structures are normal.  The bony thorax is intact.  IMPRESSION:  No active disease.  History of Present Illness/Hospital Course   Christopher [name] is a 56yo male who was observed overnight and [place] [facility] on 08/16/12 with the above problem list. He has no prior cardiac history, history of HTN, GERD, HLD and ongoing tobacco abuse. He was in his USOH until the night prior to presenting to the [facility] ED when he experiencing constant chest pressure lasting for several hours. He was able to fall asleep, but the pain recurred and woke him up around 3:00 AM. The pain was intermittent throughout the morning, and was aggravated by climbing stairs. No relation to meals. Some worsening on deep inspiration. He thus presented to the ED where an EKG and POC TnI revealed no evidence of ischemia. CXR as above revealed no acute cardiopulmonary process. The description of his chest pain contained both typical and atypical  features, however given his multiple cardiac risk factors, he was observed overnight for formal rule-out and further evaluation with nuclear stress testing the following morning. He remained asymptomatic overnight. Two subsequent troponins returned WNL. Given his size and to produce an adequate study, a two-day Lexiscan Myoview was required. The patient refused to stay for this length of time despite medical advice. This was discussed with Dr. Elease Hashimoto. The decision was made to ambulate in the halls to assess for chest pain development. He performed this well and without incident, and he will be discharged. Outpatient stress testing has been arranged as noted below. He will continue a low-dose ASA and BB, NTG SL PRN will be prescribed. He was strongly advised to call the office or present to the ED immediately if his chest pain worsened. He understood and agreed. Tobacco cessation was stressed. This information, including stress testing instructions, has been clearly outlined in the discharge AVS.   Discharge Vitals:  Blood pressure 118/81, pulse 71, temperature 98.1 F (36.7 C), temperature source Oral, resp. rate 18, height 6' (1.829 m), weight 136.85 kg (301 lb 11.2 oz), SpO2 92.00%.   Labs: Recent Labs  Basename 08/17/12 0946 08/16/12 1046   WBC 8.6 7.8   HGB 14.0 14.1   HCT 39.6 39.7   MCV 89.2 89.6   PLT 122* 133*    Lab 08/16/12 1046  NA 138  K 3.6  CL 99  CO2 26  BUN 17  CREATININE 1.07  CALCIUM 9.9  PROT 7.7  BILITOT 0.6  ALKPHOS 58  ALT 30  AST 24  AMYLASE --  LIPASE --  GLUCOSE 102*   Recent  Labs  [facility] 08/17/12 0052 08/16/12 1906   CKTOTAL -- --   CKMB -- --   CKMBINDEX -- --   TROPONINI <0.30 <0.30    Basename 08/16/12 1905  TSH 2.415  T4TOTAL --  T3FREE --  THYROIDAB --    Disposition:  Discharge Orders    Future Appointments: Provider: Department: Dept Phone: Center:   08/18/2012 12:30 PM [facility]  SITE 3 NUCLEAR MED [phone] None   09/13/2012 8:45 AM Vesta Mixer, MD [facility] Main Office [place]) [phone] [id number]     Future Orders Please Complete By Expires   Diet - low sodium heart healthy      Increase activity slowly      Discharge instructions      Comments:   PLEASE TAKE ALL NEW MEDICATIONS AS PRESCRIBED.   PLEASE ATTEND ALL SCHEDULED FOLLOW-UP PROCEDURES AND APPOINTMENTS.   YOU HAVE AN OUTPATIENT STRESS TEST ON 08/18/12 AND 08/19/12 TO EVALUATE FOR CHEST PAIN CAUSED BY LACK OF BLOOD FLOW TO YOUR HEART. SEE INSTRUCTIONS BELOW:  * No food or drink after midnight in preparation for your stress test on 08/18/12 and 08/19/12.  * No caffeine on 08/18/12 or 08/19/12 in preparation for your stress test.  * Don't take your metoprolol or nitroglycerin the morning of the stress test (08/18/12 and 08/19/12).     Follow-up Information    Follow up with [facility]. On 08/18/2012. (At 12:30 PM for stress testing.)    Contact information:   [address] [place] [address]       Follow up with Elyn Aquas., MD. On 09/13/2012. (At 8:45 AM for follow-up. )    Contact information:   [address] [place] [address] [phone]        Discharge Medications:    Medication List     As of 08/17/2012 11:18 AM    START taking these medications         metoprolol tartrate 25 MG tablet   Commonly known as: LOPRESSOR   Take 1 tablet (25 mg total) by mouth 2 (two) times daily.      nitroGLYCERIN 0.4 MG SL tablet   Commonly known as: NITROSTAT   Place 1 tablet (0.4 mg total) under the tongue every 5 (five) minutes x 3 doses as needed for chest pain.      CONTINUE taking these medications         allopurinol 100 MG tablet   Commonly known as: ZYLOPRIM      aspirin 81 MG tablet      buPROPion 150 MG 24 hr tablet   Commonly known as: WELLBUTRIN XL      esomeprazole 40 MG capsule   Commonly known as: NEXIUM        fish oil-omega-3 fatty acids 1000 MG capsule      furosemide 20 MG tablet   Commonly known as: LASIX      magnesium oxide 400 MG tablet   Commonly known as: MAG-OX      MULTIVITAMIN PO      rosuvastatin 20 MG tablet   Commonly known as: CRESTOR      sucralfate 1 G tablet   Commonly known as: CARAFATE      valsartan-hydrochlorothiazide 160-25 MG per tablet   Commonly known as: DIOVAN-HCT      VITAMIN B 12 PO      vitamin C 500 MG tablet   Commonly known  as: ASCORBIC ACID      Vitamin D (Ergocalciferol) 50000 UNITS Caps   Commonly known as: DRISDOL          Where to get your medications    These are the prescriptions that you need to pick up.   You may get these medications from any pharmacy.         metoprolol tartrate 25 MG tablet   nitroGLYCERIN 0.4 MG SL tablet           Outstanding Labs/Studies: Lexiscan Myoview 12/18 & 12/19 Duration of Discharge Encounter: Greater than 30 minutes including physician time.  Signed, R. Hurman Horn, PA-C 08/17/2012, 11:18 AM   Attending note:  See my note from earlier today.  Kristeen Miss, MD

## 2012-08-17 NOTE — Progress Notes (Signed)
Utilization review completed.  

## 2012-08-17 NOTE — Progress Notes (Signed)
No CP with ambulation. Pt asymptomatic. Emelda Brothers RN

## 2012-08-18 ENCOUNTER — Ambulatory Visit (HOSPITAL_COMMUNITY): Payer: 59 | Attending: Cardiology | Admitting: Radiology

## 2012-08-18 VITALS — BP 109/82 | Ht 72.0 in | Wt 300.0 lb

## 2012-08-18 DIAGNOSIS — R0789 Other chest pain: Secondary | ICD-10-CM | POA: Insufficient documentation

## 2012-08-18 DIAGNOSIS — I10 Essential (primary) hypertension: Secondary | ICD-10-CM | POA: Insufficient documentation

## 2012-08-18 DIAGNOSIS — R079 Chest pain, unspecified: Secondary | ICD-10-CM

## 2012-08-18 DIAGNOSIS — E785 Hyperlipidemia, unspecified: Secondary | ICD-10-CM

## 2012-08-18 DIAGNOSIS — F17200 Nicotine dependence, unspecified, uncomplicated: Secondary | ICD-10-CM | POA: Insufficient documentation

## 2012-08-18 MED ORDER — TECHNETIUM TC 99M SESTAMIBI GENERIC - CARDIOLITE
30.0000 | Freq: Once | INTRAVENOUS | Status: AC | PRN
  Administered 2012-08-18: 30 via INTRAVENOUS

## 2012-08-18 NOTE — Progress Notes (Signed)
[facility] SITE 3 NUCLEAR MED [address] [place], [place] [address] [phone]    Cardiology Nuclear Med Study  [name] is a 56 y.o. male     MRN : [id number]     DOB: 1956-07-18  Procedure Date: 08/18/2012  Nuclear Med Background Indication for Stress Test:  Evaluation for Ischemia, Post Hospital and ER with chest pressure (-) enzymes History:  No Hx of CAD Cardiac Risk Factors: Hypertension, Lipids and Smoker  Symptoms:  Chest Pressure   Nuclear Pre-Procedure Caffeine/Decaff Intake:  None NPO After: 7:00pm   Lungs:  clear O2 Sat: 94% on room air. IV 0.9% NS with Angio Cath:  20g  IV Site: R Antecubital  IV Started by:  Bonnita Levan, RN  Chest Size (in):  52/54 Cup Size: n/a  Height: 6' (1.829 m)  Weight:  300 lb (136.079 kg)  BMI:  Body mass index is 40.69 kg/(m^2). Tech Comments:  No Lasix today, no taking the Lopressor    Nuclear Med Study 1 or 2 day study: 2 day  Stress Test Type:  Stress  Reading MD: Olga Millers, MD  Order Authorizing Provider:  P.Nahser MD  Resting Radionuclide: Technetium 33m Sestamibi  Resting Radionuclide Dose: 33.0 mCi on 08/19/12   Stress Radionuclide:  Technetium 60m Sestamibi  Stress Radionuclide Dose: 33.0 mCi on 08/18/12           Stress Protocol Rest HR: 73 Stress HR: 148  Rest BP: 109/82 Stress BP: 171/87  Exercise Time (min): 8:45 METS: 10.40   Predicted Max HR: 166 bpm % Max HR: 89.16 bpm Rate Pressure Product: 46962    Dose of Adenosine (mg):  n/a Dose of Lexiscan: n/a mg  Dose of Atropine (mg): n/a Dose of Dobutamine: n/a mcg/kg/min (at max HR)  Stress Test Technologist: Milana Na, EMT-P  Nuclear Technologist:  Domenic Polite, CNMT     Rest Procedure:  Myocardial perfusion imaging was performed at rest 45 minutes following the intravenous administration of Technetium 37m Sestamibi. Rest ECG: NSR - Normal EKG  Stress Procedure:  The patient exercised on the treadmill utilizing the Bruce  Protocol for 8:45 minutes. The patient stopped due to fatigue and denied any chest pain and had rare pvcs.  Technetium 14m Sestamibi was injected at peak exercise and myocardial perfusion imaging was performed after a brief delay. Stress ECG: No significant change from baseline ECG  QPS Raw Data Images:  Normal; no motion artifact; normal heart/lung ratio. Stress Images:  Normal homogeneous uptake in all areas of the myocardium. Rest Images:  Normal homogeneous uptake in all areas of the myocardium. Subtraction (SDS):  No evidence of ischemia. Transient Ischemic Dilatation (Normal <1.22):  0.90 Lung/Heart Ratio (Normal <0.45):  0.22  Quantitative Gated Spect Images QGS EDV:  131 ml QGS ESV:  53 ml  Impression Exercise Capacity:  Good exercise capacity. BP Response:  Normal blood pressure response. Clinical Symptoms:  No significant symptoms noted. ECG Impression:  No significant ST segment change suggestive of ischemia. Comparison with Prior Nuclear Study: No images to compare  Overall Impression:  Normal stress nuclear study.  LV Ejection Fraction: 60%.  LV Wall Motion:  NL LV Function; NL Wall Motion   .Christopher [name]

## 2012-08-19 ENCOUNTER — Ambulatory Visit (HOSPITAL_COMMUNITY): Payer: 59 | Attending: Cardiology | Admitting: Radiology

## 2012-08-19 DIAGNOSIS — R0989 Other specified symptoms and signs involving the circulatory and respiratory systems: Secondary | ICD-10-CM

## 2012-08-19 MED ORDER — TECHNETIUM TC 99M SESTAMIBI GENERIC - CARDIOLITE
33.0000 | Freq: Once | INTRAVENOUS | Status: AC | PRN
  Administered 2012-08-19: 33 via INTRAVENOUS

## 2012-08-23 NOTE — Addendum Note (Signed)
Addended by: Milinda Antis on: 08/23/2012 10:06 AM   Modules accepted: Orders

## 2012-08-23 NOTE — Addendum Note (Signed)
Addended by: Milinda Antis on: 08/23/2012 10:07 AM   Modules accepted: Orders

## 2012-09-13 ENCOUNTER — Encounter: Payer: Self-pay | Admitting: Cardiovascular Disease

## 2012-09-13 ENCOUNTER — Ambulatory Visit (INDEPENDENT_AMBULATORY_CARE_PROVIDER_SITE_OTHER): Payer: 59 | Admitting: Cardiovascular Disease

## 2012-09-13 VITALS — BP 124/100 | HR 79 | Ht 72.0 in | Wt 314.0 lb

## 2012-09-13 DIAGNOSIS — I10 Essential (primary) hypertension: Secondary | ICD-10-CM

## 2012-09-13 DIAGNOSIS — R072 Precordial pain: Secondary | ICD-10-CM

## 2012-09-13 NOTE — Assessment & Plan Note (Signed)
Mr. Christopher [name] is doing well. We'll continue with his same medications. He is Myoview study was normal and revealed no evidence of ischemia. He has normal left ventricular systolic function. I don't think that his chest pains are due to ischemic heart disease.  We will not need to make a followup appointment with him that I would be happy to see him in the future if needed. He'll followup with his general medical Dr.

## 2012-09-13 NOTE — Patient Instructions (Addendum)
Your physician recommends that you continue on your current medications as directed. Please refer to the Current Medication list given to you today.  Your physician recommends that you schedule a follow-up appointment in: as needed basis 

## 2012-09-13 NOTE — Progress Notes (Signed)
Herma Carson Date of Birth  Oct 28, 1955       [facility]    [place] [address]  [address] [place], [place]  [address]   [place], [place]  [address] [phone]     [phone]   Fax  [phone]    Fax [phone]  Problem List: 1. Chest pain- normal myoview 12/13 2. Hypertension 3.  Hyperlipidemia  History of Present Illness:  Mr. Korff is a 57 year old gentleman who we met in the hospital last month. He was admitted with chest pain. He ruled out for myocardial infarction. He had a stress Myoview study which was normal.  Overall Impression: Normal stress nuclear study.  LV Ejection Fraction: 60%. LV Wall Motion: NL LV Function; NL Wall Motion  He's not had any further episodes of chest pain. He does not have any problems doing his normal activities. He has not started his exercise program yet.  His blood pressure is a little elevated today. He ate a little bit of extra salt as we can and he has not taken his blood pressure medicines this morning.  Current Outpatient Prescriptions on File Prior to Visit  Medication Sig Dispense Refill  . allopurinol (ZYLOPRIM) 100 MG tablet Take 100 mg by mouth daily.      Marland Kitchen aspirin 81 MG tablet Take 81 mg by mouth daily.        Marland Kitchen buPROPion (WELLBUTRIN XL) 150 MG 24 hr tablet Take 150 mg by mouth daily.      . Cyanocobalamin (VITAMIN B 12 PO) Take 1 tablet by mouth daily.        Marland Kitchen esomeprazole (NEXIUM) 40 MG capsule Take 40 mg by mouth 2 (two) times daily.        . FENOFIBRATE PO Take by mouth daily.      . fish oil-omega-3 fatty acids 1000 MG capsule Take 2 g by mouth daily.        . furosemide (LASIX) 20 MG tablet Take 10 mg by mouth 2 (two) times daily as needed. For water retension      . magnesium oxide (MAG-OX) 400 MG tablet Take 400 mg by mouth daily.      . Multiple Vitamin (MULTIVITAMIN PO) Take 1 tablet by mouth daily.        . nitroGLYCERIN (NITROSTAT) 0.4 MG SL tablet Place  1 tablet (0.4 mg total) under the tongue every 5 (five) minutes x 3 doses as needed for chest pain.  25 tablet  3  . rosuvastatin (CRESTOR) 20 MG tablet Take 20 mg by mouth daily.        . sucralfate (CARAFATE) 1 G tablet Take 1 g by mouth 4 (four) times daily.        . valsartan-hydrochlorothiazide (DIOVAN-HCT) 160-25 MG per tablet Take 1 tablet by mouth daily.      . vitamin C (ASCORBIC ACID) 500 MG tablet Take 500 mg by mouth 3 (three) times daily.      . Vitamin D, Ergocalciferol, (DRISDOL) 50000 UNITS CAPS Take 50,000 Units by mouth daily. Only takes Monday-saturdays      . metoprolol tartrate (LOPRESSOR) 25 MG tablet Take 1 tablet (25 mg total) by mouth 2 (two) times daily.  60 tablet  3    No Known Allergies  Past Medical History  Diagnosis Date  . Allergy   . GERD (gastroesophageal reflux disease)   . Hyperlipidemia   . Hypertension   . H/O hiatal  hernia     Past Surgical History  Procedure Date  . Upper gastrointestinal endoscopy   . Colonoscopy     History  Smoking status  . Current Some Day Smoker -- 6 years  . Types: Cigarettes  Smokeless tobacco  . Never Used    History  Alcohol Use  . Yes    Comment: occassional    Family History  Problem Relation Age of Onset  . Esophageal cancer Neg Hx   . Rectal cancer Neg Hx   . Stomach cancer Neg Hx   . Colon cancer Maternal Uncle     Reviw of Systems:  Reviewed in the HPI.  All other systems are negative.  Physical Exam: Blood pressure 124/100, pulse 79, height 6' (1.829 m), weight 314 lb (142.429 kg), SpO2 95.00%. General: Well developed, well nourished, in no acute distress.  Head: Normocephalic, atraumatic, sclera non-icteric, mucus membranes are moist,   Neck: Supple. Carotids are 2 + without bruits. No JVD   Lungs: Clear   Heart: RR, normal S1, S2  Abdomen: Soft, non-tender, non-distended with normal bowel sounds.  Msk:  Strength and tone are normal   Extremities: No clubbing or cyanosis. No  edema.  Distal pedal pulses are 2+ and equal    Neuro: CN II - XII intact.  Alert and oriented X 3.   Psych:  Normal   ECG:   Assessment / Plan:

## 2012-09-13 NOTE — Assessment & Plan Note (Signed)
His blood pressure is a little elevated today. He ate some extra salty stuff this weekend and he has not taken his blood pressure medicines yet today. I've encouraged him to work on a good diet and exercise program.

## 2013-07-05 ENCOUNTER — Telehealth: Payer: Self-pay | Admitting: *Deleted

## 2013-07-05 NOTE — Telephone Encounter (Signed)
Lm dr Oneta Rack can not rf phentermine early  Controlled rx

## 2013-08-01 ENCOUNTER — Encounter: Payer: Self-pay | Admitting: Physician Assistant

## 2013-08-01 ENCOUNTER — Ambulatory Visit (INDEPENDENT_AMBULATORY_CARE_PROVIDER_SITE_OTHER): Payer: 59 | Admitting: Physician Assistant

## 2013-08-01 VITALS — BP 128/72 | HR 80 | Temp 98.1°F | Resp 16 | Ht 71.0 in | Wt 304.0 lb

## 2013-08-01 DIAGNOSIS — N453 Epididymo-orchitis: Secondary | ICD-10-CM

## 2013-08-01 DIAGNOSIS — N508 Other specified disorders of male genital organs: Secondary | ICD-10-CM

## 2013-08-01 DIAGNOSIS — T148XXA Other injury of unspecified body region, initial encounter: Secondary | ICD-10-CM

## 2013-08-01 DIAGNOSIS — J209 Acute bronchitis, unspecified: Secondary | ICD-10-CM

## 2013-08-01 DIAGNOSIS — N5089 Other specified disorders of the male genital organs: Secondary | ICD-10-CM

## 2013-08-01 DIAGNOSIS — N451 Epididymitis: Secondary | ICD-10-CM

## 2013-08-01 DIAGNOSIS — Z23 Encounter for immunization: Secondary | ICD-10-CM

## 2013-08-01 MED ORDER — LEVOFLOXACIN 500 MG PO TABS: 500.0000 mg | ORAL_TABLET | Freq: Every day | ORAL | Status: AC

## 2013-08-01 MED ORDER — HYDROCODONE-ACETAMINOPHEN 5-325 MG PO TABS: 1.0000 | ORAL_TABLET | Freq: Four times a day (QID) | ORAL | Status: DC | PRN

## 2013-08-01 NOTE — Progress Notes (Signed)
Subjective:    Patient ID: Christopher [name], male    DOB: 07-Oct-1955, 57 y.o.   MRN: [id number]  HPI Congestion for one week not getting any better. Has tried Alk plus and mucinex with mild relief. Complains sinus congestion, chills, productive cough green sputum, denies SOB, fever, CP. 02 96-97%  Patient stepped on nail through his shoe outside yesterday. Patient states it bleed some, denies pain, red, warmth. Had last tetanus 2007.   Patient states that for last week, last Sunday night, when he woke up on Monday morning bilateral testicles were sore. Tuesday AM, left testicle was swollen. When the swelling went down he states he feels a knot the size of a marble on back lower left testicles. Denies fever, chills, trouble urination, ab pain.   Current Outpatient Prescriptions on File Prior to Visit  Medication Sig Dispense Refill  . allopurinol (ZYLOPRIM) 100 MG tablet Take 100 mg by mouth daily.      Marland Kitchen aspirin 81 MG tablet Take 81 mg by mouth daily.        Marland Kitchen buPROPion (WELLBUTRIN XL) 150 MG 24 hr tablet Take 150 mg by mouth daily.      . Cyanocobalamin (VITAMIN B 12 PO) Take 1 tablet by mouth daily.        Marland Kitchen esomeprazole (NEXIUM) 40 MG capsule Take 40 mg by mouth 2 (two) times daily.        . FENOFIBRATE PO Take by mouth daily.      . fish oil-omega-3 fatty acids 1000 MG capsule Take 2 g by mouth daily.        . furosemide (LASIX) 20 MG tablet Take 10 mg by mouth 2 (two) times daily as needed. For water retension      . magnesium oxide (MAG-OX) 400 MG tablet Take 400 mg by mouth daily.      . metoprolol tartrate (LOPRESSOR) 25 MG tablet Take 1 tablet (25 mg total) by mouth 2 (two) times daily.  60 tablet  3  . Multiple Vitamin (MULTIVITAMIN PO) Take 1 tablet by mouth daily.        . nitroGLYCERIN (NITROSTAT) 0.4 MG SL tablet Place 1 tablet (0.4 mg total) under the tongue every 5 (five) minutes x 3 doses as needed for chest pain.  25 tablet  3  . rosuvastatin (CRESTOR) 20 MG tablet Take 20  mg by mouth daily.        . sucralfate (CARAFATE) 1 G tablet Take 1 g by mouth 4 (four) times daily.        . valsartan-hydrochlorothiazide (DIOVAN-HCT) 160-25 MG per tablet Take 1 tablet by mouth daily.      . vitamin C (ASCORBIC ACID) 500 MG tablet Take 500 mg by mouth 3 (three) times daily.      . Vitamin D, Ergocalciferol, (DRISDOL) 50000 UNITS CAPS Take 50,000 Units by mouth daily. Only takes Monday-saturdays       No current facility-administered medications on file prior to visit.   Past Medical History  Diagnosis Date  . Allergy   . GERD (gastroesophageal reflux disease)   . Hyperlipidemia   . Hypertension   . H/O hiatal hernia     Review of Systems  Constitutional: Positive for chills. Negative for fever and fatigue.  HENT: Positive for congestion, rhinorrhea, sinus pressure and sneezing. Negative for facial swelling and sore throat.   Eyes: Negative.   Respiratory: Positive for cough and wheezing. Negative for chest tightness and shortness of breath.  Cardiovascular: Negative.   Gastrointestinal: Negative.   Genitourinary: Positive for testicular pain. Negative for dysuria, urgency, frequency, hematuria, flank pain, decreased urine volume and penile swelling.  Musculoskeletal: Negative.   Neurological: Negative.        Objective:   Physical Exam  Constitutional: He appears well-developed and well-nourished.  HENT:  Head: Normocephalic and atraumatic.  Nose: Right sinus exhibits maxillary sinus tenderness. Right sinus exhibits no frontal sinus tenderness. Left sinus exhibits maxillary sinus tenderness. Left sinus exhibits no frontal sinus tenderness.  Mouth/Throat: Uvula is midline, oropharynx is clear and moist and mucous membranes are normal.  Eyes: Conjunctivae are normal. Pupils are equal, round, and reactive to light.  Neck: Normal range of motion. Neck supple.  Cardiovascular: Normal rate, regular rhythm, normal heart sounds and intact distal pulses.    Pulmonary/Chest: Effort normal. No respiratory distress. He has wheezes (expiratory). He has no rales. He exhibits no tenderness.  Abdominal: Soft. Bowel sounds are normal.  Genitourinary: Penis normal. Left testis shows mass (lower 4x65mm nodule), swelling and tenderness. Left testis is descended.  Lymphadenopathy:    He has no cervical adenopathy.      Assessment & Plan:  1. Acute bronchitis - levofloxacin (LEVAQUIN) 500 MG tablet; Take 1 tablet (500 mg total) by mouth daily.  Dispense: 10 tablet; Refill: 0 - HYDROcodone-acetaminophen (NORCO) 5-325 MG per tablet; Take 1 tablet by mouth every 6 (six) hours as needed (cough).  Dispense: 30 tablet; Refill: 0 If not better get CXR  2. Epididymitis - levofloxacin (LEVAQUIN) 500 MG tablet; Take 1 tablet (500 mg total) by mouth daily.  Dispense: 10 tablet; Refill: 0  3. Mass of testicle - US Scrotum; Future  4. Puncture wound - Tdap vaccine greater than or equal to 7yo IM - levofloxacin (LEVAQUIN) 500 MG tablet; Take 1 tablet (500 mg total) by mouth daily.  Dispense: 10 tablet; Refill: 0

## 2013-08-01 NOTE — Patient Instructions (Signed)
Epididymitis  Epididymitis is a swelling (inflammation) of the epididymis. The epididymis is a cord-like structure along the back part of the testicle. Epididymitis is usually, but not always, caused by infection. This is usually a sudden problem beginning with chills, fever and pain behind the scrotum and in the testicle. There may be swelling and redness of the testicle.  DIAGNOSIS   Physical examination will reveal a tender, swollen epididymis. Sometimes, cultures are obtained from the urine or from prostate secretions to help find out if there is an infection or if the cause is a different problem. Sometimes, blood work is performed to see if your white blood cell count is elevated and if a germ (bacterial) or viral infection is present. Using this knowledge, an appropriate medicine which kills germs (antibiotic) can be chosen by your caregiver. A viral infection causing epididymitis will most often go away (resolve) without treatment.  HOME CARE INSTRUCTIONS   · Hot sitz baths for 20 minutes, 4 times per day, may help relieve pain.  · Only take over-the-counter or prescription medicines for pain, discomfort or fever as directed by your caregiver.  · Take all medicines, including antibiotics, as directed. Take the antibiotics for the full prescribed length of time even if you are feeling better.  · It is very important to keep all follow-up appointments.  SEEK IMMEDIATE MEDICAL CARE IF:   · You have a fever.  · You have pain not relieved with medicines.  · You have any worsening of your problems.  · Your pain seems to come and go.  · You develop pain, redness, and swelling in the scrotum and surrounding areas.  MAKE SURE YOU:   · Understand these instructions.  · Will watch your condition.  · Will get help right away if you are not doing well or get worse.  Document Released: 08/15/2000 Document Revised: 11/10/2011 Document Reviewed: 07/05/2009  [employer]® Patient Information ©2014 [employer].

## 2013-08-02 ENCOUNTER — Other Ambulatory Visit: Payer: 59

## 2013-08-09 ENCOUNTER — Other Ambulatory Visit: Payer: Self-pay | Admitting: Internal Medicine

## 2013-08-10 ENCOUNTER — Other Ambulatory Visit: Payer: Self-pay | Admitting: Internal Medicine

## 2013-08-10 DIAGNOSIS — F341 Dysthymic disorder: Secondary | ICD-10-CM

## 2013-08-10 DIAGNOSIS — J301 Allergic rhinitis due to pollen: Secondary | ICD-10-CM

## 2013-08-10 DIAGNOSIS — I10 Essential (primary) hypertension: Secondary | ICD-10-CM

## 2013-08-10 DIAGNOSIS — K2100 Gastro-esophageal reflux disease with esophagitis, without bleeding: Secondary | ICD-10-CM

## 2013-08-10 DIAGNOSIS — M109 Gout, unspecified: Secondary | ICD-10-CM

## 2013-08-10 MED ORDER — VALSARTAN-HYDROCHLOROTHIAZIDE 160-25 MG PO TABS: 1.0000 | ORAL_TABLET | Freq: Every day | ORAL | Status: DC

## 2013-08-10 MED ORDER — ESOMEPRAZOLE MAGNESIUM 40 MG PO CPDR: 40.0000 mg | DELAYED_RELEASE_CAPSULE | Freq: Two times a day (BID) | ORAL | Status: DC

## 2013-08-10 MED ORDER — MONTELUKAST SODIUM 10 MG PO TABS: 10.0000 mg | ORAL_TABLET | Freq: Every day | ORAL | Status: DC

## 2013-08-10 MED ORDER — BUPROPION HCL ER (XL) 300 MG PO TB24: 300.0000 mg | ORAL_TABLET | ORAL | Status: DC

## 2013-08-10 MED ORDER — ALLOPURINOL 100 MG PO TABS: 100.0000 mg | ORAL_TABLET | Freq: Every day | ORAL | Status: DC

## 2013-08-31 ENCOUNTER — Other Ambulatory Visit: Payer: Self-pay | Admitting: Internal Medicine

## 2013-08-31 DIAGNOSIS — N529 Male erectile dysfunction, unspecified: Secondary | ICD-10-CM

## 2013-08-31 MED ORDER — TESTOSTERONE 50 MG/5GM (1%) TD GEL: TRANSDERMAL | Status: DC

## 2013-09-27 ENCOUNTER — Ambulatory Visit: Payer: Self-pay | Admitting: Physician Assistant

## 2013-10-01 DIAGNOSIS — M1000 Idiopathic gout, unspecified site: Secondary | ICD-10-CM | POA: Insufficient documentation

## 2013-10-01 DIAGNOSIS — R7303 Prediabetes: Secondary | ICD-10-CM | POA: Insufficient documentation

## 2013-10-01 DIAGNOSIS — E559 Vitamin D deficiency, unspecified: Secondary | ICD-10-CM | POA: Insufficient documentation

## 2013-10-01 DIAGNOSIS — M109 Gout, unspecified: Secondary | ICD-10-CM | POA: Insufficient documentation

## 2013-10-10 ENCOUNTER — Encounter: Payer: Self-pay | Admitting: Physician Assistant

## 2013-10-10 ENCOUNTER — Ambulatory Visit (INDEPENDENT_AMBULATORY_CARE_PROVIDER_SITE_OTHER): Payer: 59 | Admitting: Physician Assistant

## 2013-10-10 VITALS — BP 120/70 | HR 72 | Temp 98.6°F | Resp 16 | Ht 71.0 in | Wt 305.0 lb

## 2013-10-10 DIAGNOSIS — Z79899 Other long term (current) drug therapy: Secondary | ICD-10-CM

## 2013-10-10 DIAGNOSIS — R7303 Prediabetes: Secondary | ICD-10-CM

## 2013-10-10 DIAGNOSIS — E785 Hyperlipidemia, unspecified: Secondary | ICD-10-CM

## 2013-10-10 DIAGNOSIS — E559 Vitamin D deficiency, unspecified: Secondary | ICD-10-CM

## 2013-10-10 DIAGNOSIS — R7309 Other abnormal glucose: Secondary | ICD-10-CM

## 2013-10-10 DIAGNOSIS — I10 Essential (primary) hypertension: Secondary | ICD-10-CM

## 2013-10-10 LAB — CBC WITH DIFFERENTIAL/PLATELET
Basophils Absolute: 0 10*3/uL (ref 0.0–0.1)
Basophils Relative: 0 % (ref 0–1)
EOS ABS: 0.2 10*3/uL (ref 0.0–0.7)
Eosinophils Relative: 2 % (ref 0–5)
HCT: 43.9 % (ref 39.0–52.0)
HEMOGLOBIN: 15.3 g/dL (ref 13.0–17.0)
LYMPHS ABS: 3.4 10*3/uL (ref 0.7–4.0)
LYMPHS PCT: 40 % (ref 12–46)
MCH: 31 pg (ref 26.0–34.0)
MCHC: 34.9 g/dL (ref 30.0–36.0)
MCV: 89 fL (ref 78.0–100.0)
MONOS PCT: 15 % — AB (ref 3–12)
Monocytes Absolute: 1.3 10*3/uL — ABNORMAL HIGH (ref 0.1–1.0)
NEUTROS ABS: 3.7 10*3/uL (ref 1.7–7.7)
NEUTROS PCT: 43 % (ref 43–77)
PLATELETS: 160 10*3/uL (ref 150–400)
RBC: 4.93 MIL/uL (ref 4.22–5.81)
RDW: 13.6 % (ref 11.5–15.5)
WBC: 8.5 10*3/uL (ref 4.0–10.5)

## 2013-10-10 MED ORDER — PHENTERMINE HCL 37.5 MG PO TABS: 37.5000 mg | ORAL_TABLET | Freq: Every day | ORAL | Status: DC

## 2013-10-10 NOTE — Progress Notes (Signed)
HPI 58 y.o. male  presents for 3 month follow up with hypertension, hyperlipidemia, prediabetes and vitamin D. His blood pressure has been controlled at home, today their BP is BP: 120/70 mmHg He denies chest pain, shortness of breath, dizziness.  His cholesterol is diet controlled. In addition they are on Crestor and denies myalgias. His cholesterol is controlled. The cholesterol last visit was: LDL 81, HDL 38.  He has been working on diet and exercise for prediabetes, doing treadmill and weight lifting 3 days and denies blurry vision, polydipsia, polyphagia and polyuria. Last A1C in the office was: 6.1 (5.4)  Patient is on Vitamin D supplement.  He has been on phentermine since Oct and no AE's- he has lost 30 lbs.  Current Medications:  Current Outpatient Prescriptions on File Prior to Visit  Medication Sig Dispense Refill  . 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 allopurinol (ZYLOPRIM) 100 MG tablet Take 1 tablet (100 mg total) by mouth daily. Prevent Gout  90 tablet  99  . aspirin 81 MG tablet Take 81 mg by mouth daily.        Marland Kitchen buPROPion (WELLBUTRIN XL) 300 MG 24 hr tablet Take 1 tablet (300 mg total) by mouth every morning.  90 tablet  99  . Cyanocobalamin (VITAMIN B 12 PO) Take 1 tablet by mouth daily.        Marland Kitchen esomeprazole (NEXIUM) 40 MG capsule Take 1 capsule (40 mg total) by mouth 2 (two) times daily.  180 capsule  99  . FENOFIBRATE PO Take by mouth daily.      . fish oil-omega-3 fatty acids 1000 MG capsule Take 2 g by mouth daily.        . furosemide (LASIX) 20 MG tablet Take 10 mg by mouth 2 (two) times daily as needed. For water retension      . HYDROcodone-acetaminophen (NORCO) 5-325 MG per tablet Take 1 tablet by mouth every 6 (six) hours as needed (cough).  30 tablet  0  . magnesium oxide (MAG-OX) 400 MG tablet Take 400 mg by mouth daily.      . montelukast (SINGULAIR) 10 MG tablet Take 1 tablet (10 mg total) by mouth at bedtime.   90 tablet  99  . Multiple Vitamin (MULTIVITAMIN PO) Take 1 tablet by mouth daily.        . rosuvastatin (CRESTOR) 20 MG tablet Take 20 mg by mouth daily.        . sucralfate (CARAFATE) 1 G tablet Take 1 g by mouth 4 (four) times daily.        Marland Kitchen testosterone (ANDROGEL) 50 MG/5GM GEL Apply 2 tubes daily  180 Tube  3  . valsartan-hydrochlorothiazide (DIOVAN-HCT) 160-25 MG per tablet Take 1 tablet by mouth daily. For BP  90 tablet  99  . vitamin C (ASCORBIC ACID) 500 MG tablet Take 500 mg by mouth 3 (three) times daily.      . Vitamin D, Ergocalciferol, (DRISDOL) 50000 UNITS CAPS Take 50,000 Units by mouth daily. Only takes Monday-saturdays       No current facility-administered medications on file prior to visit.   Medical History:  Past Medical History  Diagnosis Date  . Allergy   . GERD (gastroesophageal reflux disease)   . Hyperlipidemia   . Hypertension   . H/O hiatal hernia   . Prediabetes   . Gout   . Vitamin D deficiency  Allergies:  Allergies  Allergen Reactions  . Hyzaar [Losartan Potassium-Hctz]     ROS Constitutional: Denies fever, chills, headaches, insomnia, fatigue, night sweats Eyes: Denies redness, blurred vision, diplopia, discharge, itchy, watery eyes.  ENT: Denies congestion, post nasal drip, sore throat, earache, dental pain, Tinnitus, Vertigo, Sinus pain, snoring.  Cardio: Denies chest pain, palpitations, irregular heartbeat, dyspnea, diaphoresis, orthopnea, PND, claudication, edema Respiratory: denies cough, shortness of breath, wheezing.  Gastrointestinal: Denies dysphagia, heartburn, AB pain/ cramps, N/V, diarrhea, constipation, hematemesis, melena, hematochezia,  hemorrhoids Genitourinary: Denies dysuria, frequency, urgency, nocturia, hesitancy, discharge, hematuria, flank pain Musculoskeletal: Denies myalgia, stiffness, pain, swelling and strain/sprain. Skin: Denies pruritis, rash, changing in skin lesion Neuro: Denies Weakness, tremor, incoordination,  spasms, pain Psychiatric: Denies confusion, memory loss, sensory loss Endocrine: Denies change in weight, skin, hair change, nocturia Diabetic Polys, Denies visual blurring, hyper /hypo glycemic episodes, and paresthesia, Heme/Lymph: Denies Excessive bleeding, bruising, enlarged lymph nodes  Family history- Review and unchanged Social history- Review and unchanged Physical Exam: Filed Vitals:   10/10/13 1658  BP: 120/70  Pulse: 72  Temp: 98.6 F (37 C)  Resp: 16   Filed Weights   10/10/13 1658  Weight: 305 lb (138.347 kg)   General Appearance: Well nourished, in no apparent distress. Eyes: PERRLA, EOMs, conjunctiva no swelling or erythema Sinuses: No Frontal/maxillary tenderness ENT/Mouth: Ext aud canals clear, TMs without erythema, bulging. No erythema, swelling, or exudate on post pharynx.  Tonsils not swollen or erythematous. Hearing normal.  Neck: Supple, thyroid normal.  Respiratory: Respiratory effort normal, BS equal bilaterally without rales, rhonchi, wheezing or stridor.  Cardio: RRR with no MRGs. Brisk peripheral pulses with 2+ edema bilateral Abdomen: Soft, + BS.  Non tender, no guarding, rebound, hernias, masses. Lymphatics: Non tender without lymphadenopathy.  Musculoskeletal: Full ROM, 5/5 strength, normal gait.  Skin: Warm, dry without rashes, lesions, ecchymosis.  Neuro: Cranial nerves intact. Normal muscle tone, no cerebellar symptoms. Sensation intact.  Psych: Awake and oriented X 3, normal affect, Insight and Judgment appropriate.   Assessment and Plan:  Hypertension: Continue medication, monitor blood pressure at home.  Continue DASH diet. Cholesterol: Continue diet and exercise. Check cholesterol.  Pre-diabetes-Continue diet and exercise. Check A1C Vitamin D Def- check level and continue medications.  Obesity- doing well on phentermine, no AE's.   Continue diet and meds as discussed. Further disposition pending results of labs.  Vicie Mutters 5:04  PM

## 2013-10-10 NOTE — Patient Instructions (Addendum)
Bad carbs also include fruit juice, alcohol, and sweet tea. These are empty calories that do not signal to your brain that you are full.   Please remember the good carbs are still carbs which convert into sugar. So please measure them out no more than 1/2-1 cup of rice, oatmeal, pasta, and beans.  Veggies are however free foods! Pile them on.   I like lean protein at every meal such as chicken, [place], pork chops, cottage cheese, etc. Just do not fry these meats and please center your meal around vegetable, the meats should be a side dish.   No all fruit is created equal. Please see the list below, the fruit at the bottom is higher in sugars than the fruit at the top   Phentermine  While taking the medication we will ask that you come into the office once a month to monitor your weight, blood pressure, and heart rate. In addition we can help answer your questions about diet, exercise, and help you every step of the way with your weight loss journey. Sometime it is helpful if you bring in a food diary or use an app on your phone such as myfitnesspal to record your calorie intake, especially in the beginning.   You can start out on 1/3 to 1/2 a pill in the morning and if you are tolerating it well you can increase to one pill daily.   What is this medicine? PHENTERMINE (FEN ter meen) decreases your appetite. This medicine is intended to be used in addition to a healthy reduced calorie diet and exercise. The best results are achieved this way. This medicine is only indicated for short-term use. Eventually your weight loss may level out and the medication will no longer be needed.   How should I use this medicine? Take this medicine by mouth. Follow the directions on the prescription label. The tablets should stay in the bottle until immediately before you take your dose. Take your doses at regular intervals. Do not take your medicine more often than directed.  Overdosage: If you think you have  taken too much of this medicine contact a poison control center or emergency room at once. NOTE: This medicine is only for you. Do not share this medicine with others.  What if I miss a dose? If you miss a dose, take it as soon as you can. If it is almost time for your next dose, take only that dose. Do not take double or extra doses. Do not increase or in any way change your dose without consulting your doctor.  What should I watch for while using this medicine? Notify your physician immediately if you become short of breath while doing your normal activities. Do not take this medicine within 6 hours of bedtime. It can keep you from getting to sleep. Avoid drinks that contain caffeine and try to stick to a regular bedtime every night. Do not stand or sit up quickly, especially if you are an older patient. This reduces the risk of dizzy or fainting spells. Avoid alcoholic drinks.  What side effects may I notice from receiving this medicine? Side effects that you should report to your doctor or health care professional as soon as possible: -chest pain, palpitations -depression or severe changes in mood -increased blood pressure -irritability -nervousness or restlessness -severe dizziness -shortness of breath -problems urinating -unusual swelling of the legs -vomiting  Side effects that usually do not require medical attention (report to your doctor or health  care professional if they continue or are bothersome): -blurred vision or other eye problems -changes in sexual ability or desire -constipation or diarrhea -difficulty sleeping -dry mouth or unpleasant taste -headache -nausea This list may not describe all possible side effects. Call your doctor for medical advice about side effects. You may report side effects to FDA at 1-800-FDA-1088.  [employer]- great for steaming veggies frozen or fresh 64 oz water a day

## 2013-10-11 LAB — LIPID PANEL
CHOL/HDL RATIO: 4 ratio
CHOLESTEROL: 137 mg/dL (ref 0–200)
HDL: 34 mg/dL — ABNORMAL LOW (ref >39)
LDL CALC: 85 mg/dL (ref 0–99)
Triglycerides: 89 mg/dL (ref <150)
VLDL: 18 mg/dL (ref 0–40)

## 2013-10-11 LAB — BASIC METABOLIC PANEL WITH GFR
BUN: 14 mg/dL (ref 6–23)
CALCIUM: 9.6 mg/dL (ref 8.4–10.5)
CHLORIDE: 101 meq/L (ref 96–112)
CO2: 29 meq/L (ref 19–32)
Creat: 1.41 mg/dL — ABNORMAL HIGH (ref 0.50–1.35)
GFR, Est African American: 64 mL/min
GFR, Est Non African American: 56 mL/min — ABNORMAL LOW
GLUCOSE: 92 mg/dL (ref 70–99)
POTASSIUM: 4.4 meq/L (ref 3.5–5.3)
SODIUM: 140 meq/L (ref 135–145)

## 2013-10-11 LAB — HEMOGLOBIN A1C
HEMOGLOBIN A1C: 5.7 % — AB (ref <5.7)
Mean Plasma Glucose: 117 mg/dL — ABNORMAL HIGH (ref <117)

## 2013-10-11 LAB — HEPATIC FUNCTION PANEL
ALT: 22 U/L (ref 0–53)
AST: 20 U/L (ref 0–37)
Albumin: 4.3 g/dL (ref 3.5–5.2)
Alkaline Phosphatase: 49 U/L (ref 39–117)
BILIRUBIN DIRECT: 0.2 mg/dL (ref 0.0–0.3)
BILIRUBIN INDIRECT: 0.4 mg/dL (ref 0.2–1.2)
BILIRUBIN TOTAL: 0.6 mg/dL (ref 0.2–1.2)
Total Protein: 6.8 g/dL (ref 6.0–8.3)

## 2013-10-11 LAB — VITAMIN D 25 HYDROXY (VIT D DEFICIENCY, FRACTURES): Vit D, 25-Hydroxy: 88 ng/mL (ref 30–89)

## 2013-10-11 LAB — INSULIN, FASTING: Insulin fasting, serum: 54 u[IU]/mL — ABNORMAL HIGH (ref 3–28)

## 2013-10-11 LAB — MAGNESIUM: MAGNESIUM: 1.7 mg/dL (ref 1.5–2.5)

## 2013-10-11 LAB — TSH: TSH: 1.597 u[IU]/mL (ref 0.350–4.500)

## 2013-10-28 ENCOUNTER — Other Ambulatory Visit: Payer: Self-pay | Admitting: *Deleted

## 2013-10-28 ENCOUNTER — Other Ambulatory Visit: Payer: Self-pay | Admitting: Physician Assistant

## 2013-10-28 MED ORDER — POTASSIUM CHLORIDE ER 10 MEQ PO TBCR: 10.0000 meq | EXTENDED_RELEASE_TABLET | Freq: Three times a day (TID) | ORAL | Status: DC

## 2013-11-08 ENCOUNTER — Ambulatory Visit: Payer: Self-pay | Admitting: Physician Assistant

## 2013-11-16 ENCOUNTER — Ambulatory Visit (INDEPENDENT_AMBULATORY_CARE_PROVIDER_SITE_OTHER): Payer: 59 | Admitting: Physician Assistant

## 2013-11-16 ENCOUNTER — Encounter: Payer: Self-pay | Admitting: Physician Assistant

## 2013-11-16 VITALS — BP 128/80 | HR 88 | Temp 97.9°F | Resp 16 | Wt 292.0 lb

## 2013-11-16 DIAGNOSIS — E785 Hyperlipidemia, unspecified: Secondary | ICD-10-CM

## 2013-11-16 DIAGNOSIS — R7303 Prediabetes: Secondary | ICD-10-CM

## 2013-11-16 DIAGNOSIS — R7309 Other abnormal glucose: Secondary | ICD-10-CM

## 2013-11-16 DIAGNOSIS — I10 Essential (primary) hypertension: Secondary | ICD-10-CM

## 2013-11-16 DIAGNOSIS — M109 Gout, unspecified: Secondary | ICD-10-CM

## 2013-11-16 DIAGNOSIS — E559 Vitamin D deficiency, unspecified: Secondary | ICD-10-CM

## 2013-11-16 MED ORDER — PHENTERMINE HCL 37.5 MG PO TABS: 37.5000 mg | ORAL_TABLET | Freq: Every day | ORAL | Status: DC

## 2013-11-16 NOTE — Patient Instructions (Signed)
Phentermine  While taking the medication we will ask that you come into the office once a month to monitor your weight, blood pressure, and heart rate. In addition we can help answer your questions about diet, exercise, and help you every step of the way with your weight loss journey. Sometime it is helpful if you bring in a food diary or use an app on your phone such as myfitnesspal to record your calorie intake, especially in the beginning.   You can start out on 1/3 to 1/2 a pill in the morning and if you are tolerating it well you can increase to one pill daily.   What is this medicine? PHENTERMINE (FEN ter meen) decreases your appetite. This medicine is intended to be used in addition to a healthy reduced calorie diet and exercise. The best results are achieved this way. This medicine is only indicated for short-term use. Eventually your weight loss may level out and the medication will no longer be needed.   How should I use this medicine? Take this medicine by mouth. Follow the directions on the prescription label. The tablets should stay in the bottle until immediately before you take your dose. Take your doses at regular intervals. Do not take your medicine more often than directed.  Overdosage: If you think you have taken too much of this medicine contact a poison control center or emergency room at once. NOTE: This medicine is only for you. Do not share this medicine with others.  What if I miss a dose? If you miss a dose, take it as soon as you can. If it is almost time for your next dose, take only that dose. Do not take double or extra doses. Do not increase or in any way change your dose without consulting your doctor.  What should I watch for while using this medicine? Notify your physician immediately if you become short of breath while doing your normal activities. Do not take this medicine within 6 hours of bedtime. It can keep you from getting to sleep. Avoid drinks that contain  caffeine and try to stick to a regular bedtime every night. Do not stand or sit up quickly, especially if you are an older patient. This reduces the risk of dizzy or fainting spells. Avoid alcoholic drinks.  What side effects may I notice from receiving this medicine? Side effects that you should report to your doctor or health care professional as soon as possible: -chest pain, palpitations -depression or severe changes in mood -increased blood pressure -irritability -nervousness or restlessness -severe dizziness -shortness of breath -problems urinating -unusual swelling of the legs -vomiting  Side effects that usually do not require medical attention (report to your doctor or health care professional if they continue or are bothersome): -blurred vision or other eye problems -changes in sexual ability or desire -constipation or diarrhea -difficulty sleeping -dry mouth or unpleasant taste -headache -nausea This list may not describe all possible side effects. Call your doctor for medical advice about side effects. You may report side effects to FDA at 1-800-FDA-1088.   

## 2013-11-16 NOTE — Progress Notes (Signed)
58 y.o.male presents for a follow up after being on phentermine for weight loss for 3 months. Patient states they have better variety, improved meal pattern, less frequent dining out, fewer sweetened foods & beverages, increased physical activity and better food choices. While on the phentermine they have lost 13  lbs since last visit. They deny palpitations, anxiety, trouble sleeping, elevated BP.   Wt Readings from Last 3 Encounters:  11/16/13 292 lb (132.45 kg)  10/10/13 305 lb (138.347 kg)  08/01/13 304 lb (137.893 kg)    Typical breakfast:Oatmeal and egg in the morning Typical lunch: Salad with chicken Typical dinner:  Water 8-9 oz of 16+ 150 oz Medications: Current Outpatient Prescriptions on File Prior to Visit  Medication Sig Dispense Refill  . 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 allopurinol (ZYLOPRIM) 100 MG tablet Take 1 tablet (100 mg total) by mouth daily. Prevent Gout  90 tablet  99  . aspirin 81 MG tablet Take 81 mg by mouth daily.        . Cyanocobalamin (VITAMIN B 12 PO) Take 1 tablet by mouth daily.        Marland Kitchen esomeprazole (NEXIUM) 40 MG capsule Take 1 capsule (40 mg total) by mouth 2 (two) times daily.  180 capsule  99  . FENOFIBRATE PO Take by mouth daily.      . fish oil-omega-3 fatty acids 1000 MG capsule Take 2 g by mouth daily.        . furosemide (LASIX) 20 MG tablet Take 10 mg by mouth 2 (two) times daily as needed. For water retension      . HYDROcodone-acetaminophen (NORCO) 5-325 MG per tablet Take 1 tablet by mouth every 6 (six) hours as needed (cough).  30 tablet  0  . magnesium oxide (MAG-OX) 400 MG tablet Take 400 mg by mouth daily.      . montelukast (SINGULAIR) 10 MG tablet Take 1 tablet (10 mg total) by mouth at bedtime.  90 tablet  99  . Multiple Vitamin (MULTIVITAMIN PO) Take 1 tablet by mouth daily.        . phentermine (ADIPEX-P) 37.5 MG tablet Take 1 tablet (37.5 mg total) by mouth daily before  breakfast.  30 tablet  0  . potassium chloride (K-DUR) 10 MEQ tablet Take 1 tablet (10 mEq total) by mouth 3 (three) times daily.  270 tablet  3  . rosuvastatin (CRESTOR) 20 MG tablet Take 20 mg by mouth daily.        . sucralfate (CARAFATE) 1 G tablet Take 1 g by mouth 4 (four) times daily.        Marland Kitchen testosterone (ANDROGEL) 50 MG/5GM GEL Apply 2 tubes daily  180 Tube  3  . valsartan-hydrochlorothiazide (DIOVAN-HCT) 160-25 MG per tablet Take 1 tablet by mouth daily. For BP  90 tablet  99  . vitamin C (ASCORBIC ACID) 500 MG tablet Take 500 mg by mouth 3 (three) times daily.      . Vitamin D, Ergocalciferol, (DRISDOL) 50000 UNITS CAPS Take 50,000 Units by mouth daily. Only takes Monday-saturdays       No current facility-administered medications on file prior to visit.    ROS: All negative except for above  Physical exam: Filed Vitals:   11/16/13 1638  BP: 128/80  Pulse: 88  Temp: 97.9 F (36.6 C)  Resp: 16   General appearance: alert and moderately obese Lungs: clear to  auscultation bilaterally Chest wall: no tenderness Heart: regular rate and rhythm, S1, S2 normal, no murmur, click, rub or gallop Abdomen: soft, non-tender; bowel sounds normal; no masses,  no organomegaly Extremities: extremities normal, atraumatic, no cyanosis or edema Skin: Skin color, texture, turgor normal. No rashes or lesions Neurologic: Alert and oriented X 3, normal strength and tone. Normal symmetric reflexes. Normal coordination and gait   Assessment: Obesity with co morbid conditions.   Plan: General weight loss/lifestyle modification strategies discussed (elicit support from others; identify saboteurs; non-food rewards, etc). Behavioral treatment: O[employer] Anonymous and stress management. Diet interventions: diet diary indefinitely and qualitative changes (increase low-fat,  high-fiber foods). Informal exercise measures discussed, e.g. taking stairs instead of elevator. Regular aerobic exercise  program discussed. Medication: phentermine. Follow up in: 1 month  and as needed.

## 2013-12-06 ENCOUNTER — Other Ambulatory Visit: Payer: Self-pay | Admitting: Physician Assistant

## 2013-12-06 MED ORDER — PHENTERMINE HCL 37.5 MG PO TABS: 37.5000 mg | ORAL_TABLET | Freq: Every day | ORAL | Status: DC

## 2013-12-22 ENCOUNTER — Ambulatory Visit: Payer: Self-pay | Admitting: Internal Medicine

## 2014-01-12 ENCOUNTER — Encounter: Payer: Self-pay | Admitting: Internal Medicine

## 2014-01-12 ENCOUNTER — Ambulatory Visit (INDEPENDENT_AMBULATORY_CARE_PROVIDER_SITE_OTHER): Payer: 59 | Admitting: Internal Medicine

## 2014-01-12 VITALS — BP 118/84 | HR 108 | Temp 98.4°F | Resp 18 | Ht 71.75 in | Wt 305.0 lb

## 2014-01-12 DIAGNOSIS — N183 Chronic kidney disease, stage 3 (moderate): Secondary | ICD-10-CM

## 2014-01-12 DIAGNOSIS — N1831 Chronic kidney disease, stage 3a: Secondary | ICD-10-CM

## 2014-01-12 DIAGNOSIS — R7309 Other abnormal glucose: Secondary | ICD-10-CM

## 2014-01-12 DIAGNOSIS — R7303 Prediabetes: Secondary | ICD-10-CM

## 2014-01-12 DIAGNOSIS — I10 Essential (primary) hypertension: Secondary | ICD-10-CM

## 2014-01-12 DIAGNOSIS — Z79899 Other long term (current) drug therapy: Secondary | ICD-10-CM | POA: Insufficient documentation

## 2014-01-12 DIAGNOSIS — E785 Hyperlipidemia, unspecified: Secondary | ICD-10-CM

## 2014-01-12 DIAGNOSIS — E291 Testicular hypofunction: Secondary | ICD-10-CM

## 2014-01-12 DIAGNOSIS — Z6841 Body Mass Index (BMI) 40.0 and over, adult: Secondary | ICD-10-CM | POA: Insufficient documentation

## 2014-01-12 DIAGNOSIS — E559 Vitamin D deficiency, unspecified: Secondary | ICD-10-CM

## 2014-01-12 LAB — CBC WITH DIFFERENTIAL/PLATELET
Basophils Absolute: 0 10*3/uL (ref 0.0–0.1)
Basophils Relative: 0 % (ref 0–1)
Eosinophils Absolute: 0.2 10*3/uL (ref 0.0–0.7)
Eosinophils Relative: 3 % (ref 0–5)
HEMATOCRIT: 43.2 % (ref 39.0–52.0)
HEMOGLOBIN: 15.4 g/dL (ref 13.0–17.0)
LYMPHS ABS: 2.7 10*3/uL (ref 0.7–4.0)
Lymphocytes Relative: 38 % (ref 12–46)
MCH: 31.2 pg (ref 26.0–34.0)
MCHC: 35.6 g/dL (ref 30.0–36.0)
MCV: 87.4 fL (ref 78.0–100.0)
MONOS PCT: 15 % — AB (ref 3–12)
Monocytes Absolute: 1.1 10*3/uL — ABNORMAL HIGH (ref 0.1–1.0)
NEUTROS ABS: 3.2 10*3/uL (ref 1.7–7.7)
NEUTROS PCT: 44 % (ref 43–77)
Platelets: 155 10*3/uL (ref 150–400)
RBC: 4.94 MIL/uL (ref 4.22–5.81)
RDW: 13.8 % (ref 11.5–15.5)
WBC: 7.2 10*3/uL (ref 4.0–10.5)

## 2014-01-12 NOTE — Patient Instructions (Signed)

## 2014-01-12 NOTE — Progress Notes (Signed)
Patient ID: Christopher [name], male   DOB: 1955/12/23, 58 y.o.   MRN: [id number]    This very nice 58 y.o. DBM presents for 3 month follow up with Hypertension, Hyperlipidemia, Pre-Diabetes and Vitamin D Deficiency.    HTN predates since 2005. BP has been controlled at home. Today's BP: 118/84 mmHg. Patient denies any cardiac type chest pain, palpitations, dyspnea/orthopnea/PND, dizziness, claudication, or dependent edema.   Hyperlipidemia is controlled with diet & meds. Last lipids at goal in Feb 2015 as below. Patient denies myalgias or other med SE's.  Lab Results  Component Value Date   CHOL 137 10/10/2013   HDL 34* 10/10/2013   LDLCALC 85 10/10/2013   TRIG 89 10/10/2013   CHOLHDL 4.0 10/10/2013    Also, the patient has Morbid Obesity (BMI 42.7)and consequent  PreDiabetes/insulin resistance A1c 5.8% in 2011 and 6.1% in 2012  and last  A1c was  5.7% in Feb 2015. Patient denies any symptoms of reactive hypoglycemia, diabetic polys, paresthesias or visual blurring.   Further, Patient has history of Vitamin D Deficiency and last vitamin D was 40 in Feb 2015. Patient supplements vitamin D without any suspected side-effects.    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 Prevent Gout     aspirin 81 MG tablet  Take 81 mg by mouth daily.     esomeprazole 40 MG capsule  Commonly known as:  NEXIUM  Take 1 capsule (40 mg total) by mouth 2 (two) times daily.     fenofibrate micronized 134 MG capsule  Commonly known as:  LOFIBRA  Take 134 mg by mouth daily before breakfast.     fish oil-omega-3 fatty acids 1000 MG capsule  Take 2 g by mouth daily.     furosemide 20 MG tablet  Commonly known as:  LASIX  Take 10 mg by mouth 2 (two) times daily as needed. For water retension     magnesium oxide 400 MG tablet  Commonly  known as:  MAG-OX  Take 400 mg by mouth daily.     montelukast 10 MG tablet  Commonly known as:  SINGULAIR  Take 1 tablet (10 mg total) by mouth at bedtime.     MULTIVITAMIN PO  Take 1 tablet by mouth daily.     phentermine 37.5 MG tablet  Commonly known as:  ADIPEX-P  Take 1 tablet (37.5 mg total) by mouth daily before breakfast.     potassium chloride 10 MEQ tablet  Commonly known as:  K-DUR  Take 1 tablet (10 mEq total) by mouth 3 (three) times daily.     rosuvastatin 20 MG tablet  Commonly known as:  CRESTOR  Take 20 mg by mouth daily.     sucralfate 1 G tablet  Commonly known as:  CARAFATE  Take 1 g by mouth 4 (four) times daily.     testosterone 50 MG/5GM (1%) Gel  Commonly known as:  ANDROGEL  Apply 2 tubes daily     valsartan-hydrochlorothiazide 160-25 MG per tablet  Commonly known as:  DIOVAN-HCT  Take 1 tablet by mouth daily. For BP     VITAMIN B 12 PO  Take 1 tablet by mouth daily.     vitamin C 500 MG tablet  Commonly known as:  ASCORBIC ACID  Take 500 mg by mouth 3 (three) times daily.     Vitamin D (Ergocalciferol) 50000 UNITS Caps capsule  Commonly known as:  DRISDOL  Take 50,000 Units by mouth daily. Only takes Monday-saturdays        Allergies  Allergen Reactions  . Hyzaar [Losartan Potassium-Hctz]    PMHx:   Past Medical History  Diagnosis Date  . Allergy   . GERD (gastroesophageal reflux disease)   . Hyperlipidemia   . Hypertension   . H/O hiatal hernia   . Prediabetes   . Gout   . Vitamin D deficiency    FHx:    Reviewed / unchanged  SHx:    Reviewed / unchanged   Systems Review: Constitutional: Denies fever, chills, wt changes, headaches, insomnia, fatigue, night sweats, change in appetite. Eyes: Denies redness, blurred vision, diplopia, discharge, itchy, watery eyes.  ENT: Denies discharge, congestion, post nasal drip, epistaxis, sore throat, earache, hearing loss, dental pain, tinnitus, vertigo, sinus pain, snoring.  CV:  Denies chest pain, palpitations, irregular heartbeat, syncope, dyspnea, diaphoresis, orthopnea, PND, claudication or edema. Respiratory: denies cough, dyspnea, DOE, pleurisy, hoarseness, laryngitis, wheezing.  Gastrointestinal: Denies dysphagia, odynophagia, heartburn, reflux, water brash, abdominal pain or cramps, nausea, vomiting, bloating, diarrhea, constipation, hematemesis, melena, hematochezia  or hemorrhoids. Genitourinary: Denies dysuria, frequency, urgency, nocturia, hesitancy, discharge, hematuria or flank pain. Musculoskeletal: Denies arthralgias, myalgias, stiffness, jt. swelling, pain, limping or strain/sprain.  Skin: Denies pruritus, rash, hives, warts, acne, eczema or change in skin lesion(s). Neuro: No weakness, tremor, incoordination, spasms, paresthesia or pain. Psychiatric: Denies confusion, memory loss or sensory loss. Endo: Denies change in weight, skin or hair change.  Heme/Lymph: No excessive bleeding, bruising or enlarged lymph nodes.  Exam:  BP 118/84  Pulse 108  Temp 98.4 F   Resp 18  Ht 5' 11.75"   Wt 305 lb   BMI 41.67 kg/m2  Appears well nourished - in no distress. Eyes: PERRLA, EOMs, conjunctiva no swelling or erythema. Sinuses: No frontal/maxillary tenderness ENT/Mouth: EAC's clear, TM's nl w/o erythema, bulging. Nares clear w/o erythema, swelling, exudates. Oropharynx clear without erythema or exudates. Oral hygiene is good. Tongue normal, non obstructing. Hearing intact.  Neck: Supple. Thyroid nl. Car 2+/2+ without bruits, nodes or JVD. Chest: Respirations nl with BS clear & equal w/o rales, rhonchi, wheezing or stridor.  Cor: Heart sounds normal w/ regular rate and rhythm without sig. murmurs, gallops, clicks, or rubs. Peripheral pulses normal and equal  without edema.  Abdomen: Soft & bowel sounds normal. Non-tender w/o guarding, rebound, hernias, masses, or organomegaly.  Lymphatics: Unremarkable.  Musculoskeletal: Full ROM all peripheral  extremities, joint stability, 5/5 strength, and normal gait.  Skin: Warm, dry without exposed rashes, lesions or ecchymosis apparent.  Neuro: Cranial nerves intact, reflexes equal bilaterally. Sensory-motor testing grossly intact. Tendon reflexes grossly intact.  Pysch: Alert & oriented x 3. Insight and judgement nl & appropriate. No ideations.  Assessment and Plan:  1. Hypertension - Continue monitor blood pressure at home. Continue diet/meds same.  2. Hyperlipidemia - Continue diet/meds, exercise,& lifestyle modifications. Continue monitor periodic cholesterol/liver & renal functions   3. Pre-diabetes/Insulin Resistance - Continue diet, exercise, lifestyle modifications. Monitor appropriate labs.  4. Vitamin D Deficiency - Continue supplementation.  5. Morbid Obesity (BMI 41.7)   Recommended regular exercise, BP monitoring, weight control, and discussed med and SE's. Recommended labs to assess and monitor clinical status. Further disposition pending results of labs.  Long discussion today re: the  importance of aggressive weight loss and how to accomplish that with  Dr Dimas Chyle book "The End Of Dieting".

## 2014-01-13 ENCOUNTER — Other Ambulatory Visit: Payer: Self-pay | Admitting: Physician Assistant

## 2014-01-13 LAB — BASIC METABOLIC PANEL WITH GFR
BUN: 14 mg/dL (ref 6–23)
CO2: 24 meq/L (ref 19–32)
Calcium: 9.8 mg/dL (ref 8.4–10.5)
Chloride: 101 mEq/L (ref 96–112)
Creat: 1.34 mg/dL (ref 0.50–1.35)
GFR, Est African American: 68 mL/min
GFR, Est Non African American: 59 mL/min — ABNORMAL LOW
GLUCOSE: 86 mg/dL (ref 70–99)
POTASSIUM: 3.9 meq/L (ref 3.5–5.3)
SODIUM: 138 meq/L (ref 135–145)

## 2014-01-13 LAB — VITAMIN D 25 HYDROXY (VIT D DEFICIENCY, FRACTURES): VIT D 25 HYDROXY: 71 ng/mL (ref 30–89)

## 2014-01-13 LAB — HEPATIC FUNCTION PANEL
ALBUMIN: 4.5 g/dL (ref 3.5–5.2)
ALT: 28 U/L (ref 0–53)
AST: 23 U/L (ref 0–37)
Alkaline Phosphatase: 53 U/L (ref 39–117)
Bilirubin, Direct: 0.2 mg/dL (ref 0.0–0.3)
Indirect Bilirubin: 0.3 mg/dL (ref 0.2–1.2)
TOTAL PROTEIN: 6.9 g/dL (ref 6.0–8.3)
Total Bilirubin: 0.5 mg/dL (ref 0.2–1.2)

## 2014-01-13 LAB — TSH: TSH: 1.893 u[IU]/mL (ref 0.350–4.500)

## 2014-01-13 LAB — INSULIN, FASTING: Insulin fasting, serum: 249 u[IU]/mL — ABNORMAL HIGH (ref 3–28)

## 2014-01-13 LAB — LIPID PANEL
CHOLESTEROL: 137 mg/dL (ref 0–200)
HDL: 33 mg/dL — AB (ref >39)
LDL Cholesterol: 74 mg/dL (ref 0–99)
Total CHOL/HDL Ratio: 4.2 Ratio
Triglycerides: 151 mg/dL — ABNORMAL HIGH (ref <150)
VLDL: 30 mg/dL (ref 0–40)

## 2014-01-13 LAB — TESTOSTERONE: TESTOSTERONE: 1197 ng/dL — AB (ref 300–890)

## 2014-01-13 LAB — MAGNESIUM: Magnesium: 1.8 mg/dL (ref 1.5–2.5)

## 2014-01-13 LAB — HEMOGLOBIN A1C
Hgb A1c MFr Bld: 5.8 % — ABNORMAL HIGH (ref <5.7)
MEAN PLASMA GLUCOSE: 120 mg/dL — AB (ref <117)

## 2014-01-27 ENCOUNTER — Telehealth: Payer: Self-pay

## 2014-01-27 NOTE — Telephone Encounter (Signed)
Received paper note from front office staff, patient states having very bad leg cramps at night, states from knees to hips, it was bad enough to keep him out of work, per Vicie Mutters, [place] , I returned his call at [phone] lmom at this number and advised him per Vicie Mutters, [place] to stop the Crestor and to schedule a office visit, he can take Tylenol or Aleve for pain.

## 2014-02-21 ENCOUNTER — Encounter: Payer: Self-pay | Admitting: Physician Assistant

## 2014-02-21 ENCOUNTER — Ambulatory Visit (INDEPENDENT_AMBULATORY_CARE_PROVIDER_SITE_OTHER): Payer: 59 | Admitting: Physician Assistant

## 2014-02-21 VITALS — BP 122/88 | HR 80 | Temp 97.9°F | Resp 16 | Ht 71.0 in | Wt 316.0 lb

## 2014-02-21 DIAGNOSIS — Z79899 Other long term (current) drug therapy: Secondary | ICD-10-CM

## 2014-02-21 DIAGNOSIS — N3000 Acute cystitis without hematuria: Secondary | ICD-10-CM

## 2014-02-21 DIAGNOSIS — IMO0001 Reserved for inherently not codable concepts without codable children: Secondary | ICD-10-CM

## 2014-02-21 DIAGNOSIS — D509 Iron deficiency anemia, unspecified: Secondary | ICD-10-CM

## 2014-02-21 LAB — CBC WITH DIFFERENTIAL/PLATELET
Basophils Absolute: 0 10*3/uL (ref 0.0–0.1)
Basophils Relative: 0 % (ref 0–1)
EOS ABS: 0.2 10*3/uL (ref 0.0–0.7)
Eosinophils Relative: 2 % (ref 0–5)
HCT: 43.7 % (ref 39.0–52.0)
Hemoglobin: 15.5 g/dL (ref 13.0–17.0)
Lymphocytes Relative: 37 % (ref 12–46)
Lymphs Abs: 3.1 10*3/uL (ref 0.7–4.0)
MCH: 30.5 pg (ref 26.0–34.0)
MCHC: 35.5 g/dL (ref 30.0–36.0)
MCV: 85.9 fL (ref 78.0–100.0)
Monocytes Absolute: 1 10*3/uL (ref 0.1–1.0)
Monocytes Relative: 12 % (ref 3–12)
NEUTROS ABS: 4.2 10*3/uL (ref 1.7–7.7)
NEUTROS PCT: 49 % (ref 43–77)
PLATELETS: 144 10*3/uL — AB (ref 150–400)
RBC: 5.09 MIL/uL (ref 4.22–5.81)
RDW: 14.4 % (ref 11.5–15.5)
WBC: 8.5 10*3/uL (ref 4.0–10.5)

## 2014-02-21 LAB — URIC ACID: Uric Acid, Serum: 5.1 mg/dL (ref 4.0–7.8)

## 2014-02-21 LAB — HEPATIC FUNCTION PANEL
ALT: 27 U/L (ref 0–53)
AST: 29 U/L (ref 0–37)
Albumin: 4.4 g/dL (ref 3.5–5.2)
Alkaline Phosphatase: 51 U/L (ref 39–117)
BILIRUBIN DIRECT: 0.2 mg/dL (ref 0.0–0.3)
BILIRUBIN TOTAL: 0.8 mg/dL (ref 0.2–1.2)
Indirect Bilirubin: 0.6 mg/dL (ref 0.2–1.2)
Total Protein: 7 g/dL (ref 6.0–8.3)

## 2014-02-21 LAB — BASIC METABOLIC PANEL WITH GFR
BUN: 13 mg/dL (ref 6–23)
CALCIUM: 9.5 mg/dL (ref 8.4–10.5)
CO2: 28 meq/L (ref 19–32)
Chloride: 100 mEq/L (ref 96–112)
Creat: 1.24 mg/dL (ref 0.50–1.35)
GFR, Est African American: 75 mL/min
GFR, Est Non African American: 65 mL/min
Glucose, Bld: 90 mg/dL (ref 70–99)
POTASSIUM: 3.9 meq/L (ref 3.5–5.3)
SODIUM: 137 meq/L (ref 135–145)

## 2014-02-21 LAB — MAGNESIUM: MAGNESIUM: 1.8 mg/dL (ref 1.5–2.5)

## 2014-02-21 LAB — SEDIMENTATION RATE: Sed Rate: 1 mm/hr (ref 0–16)

## 2014-02-21 LAB — IRON AND TIBC
%SAT: 30 % (ref 20–55)
Iron: 108 ug/dL (ref 42–165)
TIBC: 360 ug/dL (ref 215–435)
UIBC: 252 ug/dL (ref 125–400)

## 2014-02-21 LAB — CK: CK TOTAL: 518 U/L — AB (ref 7–232)

## 2014-02-21 NOTE — Patient Instructions (Signed)

## 2014-02-21 NOTE — Progress Notes (Signed)
Subjective:    Patient ID: Christopher [name], male    DOB: 06-Oct-1955, 58 y.o.   MRN: [id number]  HPI 58 y.o. AA male with HTN, CKD, chol presents with intermittent leg and arm cramping. He has been having night time leg cramps for years but they have increased over the last month and in addition he is now having arm cramping for the past week. He is on HCTZ 25 with his valsartan and states that occ he will take a lasix for leg swelling but avoids it because it makes the cramping worse. He was taken off his crestor 2 weeks ago but continues on Fenofibrate. He denies weakness, headache, joint pain. He has had diarrhea intermittently, no blood.   Lab Results  Component Value Date   CREATININE 1.34 01/12/2014   BUN 14 01/12/2014   NA 138 01/12/2014   K 3.9 01/12/2014   CL 101 01/12/2014   CO2 24 01/12/2014     Current Outpatient Prescriptions on File Prior to Visit  Medication Sig Dispense Refill  . 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 allopurinol (ZYLOPRIM) 100 MG tablet Take 1 tablet (100 mg total) by mouth daily. Prevent Gout  90 tablet  99  . aspirin 81 MG tablet Take 81 mg by mouth daily.        . Cyanocobalamin (VITAMIN B 12 PO) Take 1 tablet by mouth daily.        Marland Kitchen esomeprazole (NEXIUM) 40 MG capsule Take 1 capsule (40 mg total) by mouth 2 (two) times daily.  180 capsule  99  . fenofibrate micronized (LOFIBRA) 134 MG capsule Take 134 mg by mouth daily before breakfast.      . fish oil-omega-3 fatty acids 1000 MG capsule Take 2 g by mouth daily.        . furosemide (LASIX) 20 MG tablet Take 10 mg by mouth 2 (two) times daily as needed. For water retension      . magnesium oxide (MAG-OX) 400 MG tablet Take 400 mg by mouth daily.      . montelukast (SINGULAIR) 10 MG tablet Take 1 tablet (10 mg total) by mouth at bedtime.  90 tablet  99  . Multiple Vitamin (MULTIVITAMIN PO) Take 1 tablet by mouth daily.        . phentermine (ADIPEX-P) 37.5  MG tablet TAKE 1 TABLET BY MOUTH DAILY BEFORE BREAKFAST  30 tablet  1  . potassium chloride (K-DUR) 10 MEQ tablet Take 1 tablet (10 mEq total) by mouth 3 (three) times daily.  270 tablet  3  . sucralfate (CARAFATE) 1 G tablet Take 1 g by mouth 4 (four) times daily.        Marland Kitchen testosterone (ANDROGEL) 50 MG/5GM GEL Apply 2 tubes daily  180 Tube  3  . valsartan-hydrochlorothiazide (DIOVAN-HCT) 160-25 MG per tablet Take 1 tablet by mouth daily. For BP  90 tablet  99  . vitamin C (ASCORBIC ACID) 500 MG tablet Take 500 mg by mouth 3 (three) times daily.      . Vitamin D, Ergocalciferol, (DRISDOL) 50000 UNITS CAPS Take 50,000 Units by mouth daily. Only takes Monday-saturdays       No current facility-administered medications on file prior to visit.   Past Medical History  Diagnosis Date  . Allergy   . GERD (gastroesophageal reflux disease)   . Hyperlipidemia   . Hypertension   . H/O hiatal  hernia   . Prediabetes   . Gout   . Vitamin D deficiency      Review of Systems  Constitutional: Positive for diaphoresis (from working outside). Negative for fever, chills, activity change, appetite change and fatigue.  HENT: Negative.   Respiratory: Negative.   Cardiovascular: Positive for leg swelling. Negative for chest pain and palpitations.  Gastrointestinal: Positive for diarrhea. Negative for nausea, vomiting, abdominal pain, constipation, blood in stool, abdominal distention and rectal pain.  Genitourinary: Negative.   Musculoskeletal: Positive for myalgias. Negative for arthralgias, back pain, gait problem, joint swelling, neck pain and neck stiffness.  Skin: Negative.   Neurological: Negative.        Objective:   Physical Exam  Constitutional: He is oriented to person, place, and time. He appears well-developed and well-nourished.  HENT:  Head: Normocephalic and atraumatic.  Eyes: Conjunctivae and EOM are normal. Pupils are equal, round, and reactive to light.  Neck: Normal range of  motion. Neck supple.  Cardiovascular: Normal rate, regular rhythm and normal heart sounds.   Pulmonary/Chest: Effort normal and breath sounds normal. He has no wheezes.  Abdominal: Soft. Bowel sounds are normal. There is no tenderness.  Musculoskeletal: Normal range of motion. He exhibits edema (bilateral legs, 1-2 +).  Lymphadenopathy:    He has no cervical adenopathy.  Neurological: He is alert and oriented to person, place, and time. He has normal reflexes. Abnormal reflex: difficult to get lower extremity DTRs.  Skin: Skin is warm and dry.      Assessment & Plan:  Myalgias- likely from HCTZ, sweating, and intermittent diarrhea, will get CBC, BMP, Mag, CPK, Sed rate, uric acid. He will cut the valsartan HCTZ in half, and continue weight loss.

## 2014-02-22 LAB — URINALYSIS, ROUTINE W REFLEX MICROSCOPIC
Bilirubin Urine: NEGATIVE
GLUCOSE, UA: NEGATIVE mg/dL
Hgb urine dipstick: NEGATIVE
Ketones, ur: NEGATIVE mg/dL
LEUKOCYTES UA: NEGATIVE
Nitrite: NEGATIVE
Protein, ur: NEGATIVE mg/dL
Specific Gravity, Urine: 1.013 (ref 1.005–1.030)
Urobilinogen, UA: 0.2 mg/dL (ref 0.0–1.0)
pH: 7 (ref 5.0–8.0)

## 2014-02-22 LAB — URINE CULTURE
Colony Count: NO GROWTH
Organism ID, Bacteria: NO GROWTH

## 2014-02-27 ENCOUNTER — Ambulatory Visit (INDEPENDENT_AMBULATORY_CARE_PROVIDER_SITE_OTHER): Payer: 59 | Admitting: Internal Medicine

## 2014-02-27 ENCOUNTER — Encounter: Payer: Self-pay | Admitting: Internal Medicine

## 2014-02-27 VITALS — BP 134/88 | HR 76 | Temp 97.9°F | Resp 16 | Ht 71.75 in | Wt 315.0 lb

## 2014-02-27 DIAGNOSIS — I10 Essential (primary) hypertension: Secondary | ICD-10-CM

## 2014-02-27 DIAGNOSIS — Q828 Other specified congenital malformations of skin: Secondary | ICD-10-CM

## 2014-02-27 NOTE — Progress Notes (Signed)
Subjective:    Patient ID: Christopher [name], male    DOB: [birth year], 58 y.o.   MRN: [id number]  HPI  Patient presents today for F/U of HTN and also for evaluation of multiple irritated Bilateral Skin tags of each axillae . He states the tags are very pruritic and especially become more irritated in the summertime with perspiration. Regarding his BP, he denies any recent CV Sx's. Medication Sig  . 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 allopurinol (ZYLOPRIM) 100 MG tablet Take 1 tablet (100 mg total) by mouth daily. Prevent Gout  . aspirin 81 MG tablet Take 81 mg by mouth daily.    . Cyanocobalamin (VITAMIN B 12 PO) Take 1 tablet by mouth daily.    Marland Kitchen esomeprazole (NEXIUM) 40 MG capsule Take 1 capsule (40 mg total) by mouth 2 (two) times daily.  . fenofibrate micronized (LOFIBRA) 134 MG capsule Take 134 mg by mouth daily before breakfast.  . fish oil-omega-3 fatty acids 1000 MG capsule Take 2 g by mouth daily.    . furosemide (LASIX) 20 MG tablet Take 10 mg by mouth 2 (two) times daily as needed. For water retension  . magnesium oxide (MAG-OX) 400 MG tablet Take 400 mg by mouth daily.  . montelukast (SINGULAIR) 10 MG tablet Take 1 tablet (10 mg total) by mouth at bedtime.  . Multiple Vitamin (MULTIVITAMIN PO) Take 1 tablet by mouth daily.    . phentermine (ADIPEX-P) 37.5 MG tablet TAKE 1 TABLET BY MOUTH DAILY BEFORE BREAKFAST  . potassium chloride (K-DUR) 10 MEQ tablet Take 1 tablet (10 mEq total) by mouth 3 (three) times daily.  . sucralfate (CARAFATE) 1 G tablet Take 1 g by mouth 4 (four) times daily.    Marland Kitchen testosterone (ANDROGEL) 50 MG/5GM GEL Apply 2 tubes daily  . valsartan-hydrochlorothiazide (DIOVAN-HCT) 160-25 MG per tablet Take 1 tablet by mouth daily. For BP  . vitamin C (ASCORBIC ACID) 500 MG tablet Take 500 mg by mouth 3 (three) times daily.  . Vitamin D, Ergocalciferol, (DRISDOL) 50000 UNITS CAPS Take 50,000 Units by mouth daily. Only takes  Monday-saturdays   Allergies  Allergen Reactions  . Hyzaar [Losartan Potassium-Hctz]    Past Medical History  Diagnosis Date  . Allergy   . GERD (gastroesophageal reflux disease)   . Hyperlipidemia   . Hypertension   . H/O hiatal hernia   . Prediabetes   . Gout   . Vitamin D deficiency    Review of Systems In addition to the HPI above,  No Fever-chills,  No Headache, No changes with Vision or hearing,  No problems swallowing food or Liquids,  No Chest pain or productive Cough or Shortness of Breath,  No Abdominal pain, No Nausea or Vomitting, Bowel movements are regular,  No Blood in stool or Urine,  No dysuria,  No new skin rashes or bruises,  No new joints pains-aches,  No new weakness, tingling, numbness in any extremity,  No recent weight loss,  No polyuria, polydypsia or polyphagia,  No significant Mental Stressors.  A full 10 point Review of Systems was done, except as stated above, all other Review of Systems were negative  Objective:   Physical Exam BP 134/88  P 76  T 97.9 F   Resp 16  Ht 5' 11.75"   Wt 315 lb   BMI 43.04 kg/m2  HEENT - Eac's patent. TM's Nl. EOM's full. PERRLA. NasoOroPharynx clear. Neck - supple.  Nl Thyroid. Carotids 2+ & No bruits, nodes, JVD Chest - Clear equal BS w/o Rales, rhonchi, wheezes. Cor - Nl HS. RRR w/o sig MGR. PP 1(+). No edema. Abd - No palpable organomegaly, masses or tenderness. BS nl. MS- FROM w/o deformities. Muscle power, tone and bulk Nl. Gait Nl. Neuro - No obvious Cr N abnormalities. Sensory, motor and Cerebellar functions appear Nl w/o focal abnormalities. Psyche - Mental status normal & appropriate.  No delusions, ideations or obvious mood abnormalities. Skin - multiple irritated skin tags that range in size from 3-4 to 8-10 mm in long axis.  Assessment & Plan:   1. Hypertension- labile  - encouraged frequent BP monitoring. - Long discussion today re: importance of weight loss.  2. Skin tags, Multiple -  irritated - with informed consent and after aseptic prep w/Isopropyl alcohol &  local anesthesia w/Marcaine 0.5% w/epi - the skin tags were sharply excised and wounds hyfrecated for hemostasis. #13 tags were excised from the Lt axilla & #1 from the Lt nipple & #11 from the Rt axillae.  - Patient was instructed in wound care.

## 2014-03-07 ENCOUNTER — Other Ambulatory Visit: Payer: Self-pay | Admitting: Physician Assistant

## 2014-03-07 ENCOUNTER — Other Ambulatory Visit: Payer: Self-pay | Admitting: Internal Medicine

## 2014-03-07 DIAGNOSIS — N529 Male erectile dysfunction, unspecified: Secondary | ICD-10-CM

## 2014-03-07 DIAGNOSIS — E291 Testicular hypofunction: Secondary | ICD-10-CM

## 2014-03-07 MED ORDER — TESTOSTERONE 50 MG/5GM (1%) TD GEL: TRANSDERMAL | Status: DC

## 2014-04-07 ENCOUNTER — Other Ambulatory Visit: Payer: Self-pay | Admitting: Physician Assistant

## 2014-04-14 ENCOUNTER — Ambulatory Visit: Payer: Self-pay | Admitting: Emergency Medicine

## 2014-04-14 ENCOUNTER — Encounter: Payer: Self-pay | Admitting: Internal Medicine

## 2014-04-14 NOTE — Progress Notes (Signed)
Patient ID: Christopher [name], male   DOB: 05-Sep-1955, 58 y.o.   MRN: [id number]  [facility]

## 2014-04-18 ENCOUNTER — Ambulatory Visit (INDEPENDENT_AMBULATORY_CARE_PROVIDER_SITE_OTHER): Payer: 59 | Admitting: Internal Medicine

## 2014-04-18 ENCOUNTER — Encounter: Payer: Self-pay | Admitting: Internal Medicine

## 2014-04-18 VITALS — BP 116/82 | HR 88 | Temp 97.7°F | Resp 18 | Ht 71.75 in | Wt 304.4 lb

## 2014-04-18 DIAGNOSIS — E785 Hyperlipidemia, unspecified: Secondary | ICD-10-CM

## 2014-04-18 DIAGNOSIS — E559 Vitamin D deficiency, unspecified: Secondary | ICD-10-CM

## 2014-04-18 DIAGNOSIS — R7309 Other abnormal glucose: Secondary | ICD-10-CM

## 2014-04-18 DIAGNOSIS — Z79899 Other long term (current) drug therapy: Secondary | ICD-10-CM

## 2014-04-18 DIAGNOSIS — E349 Endocrine disorder, unspecified: Secondary | ICD-10-CM | POA: Insufficient documentation

## 2014-04-18 DIAGNOSIS — I10 Essential (primary) hypertension: Secondary | ICD-10-CM

## 2014-04-18 DIAGNOSIS — E291 Testicular hypofunction: Secondary | ICD-10-CM

## 2014-04-18 DIAGNOSIS — R7303 Prediabetes: Secondary | ICD-10-CM

## 2014-04-18 LAB — CBC WITH DIFFERENTIAL/PLATELET
BASOS ABS: 0 10*3/uL (ref 0.0–0.1)
BASOS PCT: 0 % (ref 0–1)
Eosinophils Absolute: 0.1 10*3/uL (ref 0.0–0.7)
Eosinophils Relative: 2 % (ref 0–5)
HCT: 39.3 % (ref 39.0–52.0)
Hemoglobin: 14.5 g/dL (ref 13.0–17.0)
LYMPHS PCT: 39 % (ref 12–46)
Lymphs Abs: 2.9 10*3/uL (ref 0.7–4.0)
MCH: 32.4 pg (ref 26.0–34.0)
MCHC: 36.9 g/dL — ABNORMAL HIGH (ref 30.0–36.0)
MCV: 87.9 fL (ref 78.0–100.0)
Monocytes Absolute: 0.9 10*3/uL (ref 0.1–1.0)
Monocytes Relative: 12 % (ref 3–12)
NEUTROS ABS: 3.5 10*3/uL (ref 1.7–7.7)
Neutrophils Relative %: 47 % (ref 43–77)
PLATELETS: 144 10*3/uL — AB (ref 150–400)
RBC: 4.47 MIL/uL (ref 4.22–5.81)
RDW: 14.6 % (ref 11.5–15.5)
WBC: 7.4 10*3/uL (ref 4.0–10.5)

## 2014-04-18 LAB — HEMOGLOBIN A1C
Hgb A1c MFr Bld: 6 % — ABNORMAL HIGH (ref <5.7)
Mean Plasma Glucose: 126 mg/dL — ABNORMAL HIGH (ref <117)

## 2014-04-18 MED ORDER — CYCLOBENZAPRINE HCL 10 MG PO TABS: ORAL_TABLET | ORAL | Status: DC

## 2014-04-18 MED ORDER — TESTOSTERONE CYPIONATE 200 MG/ML IM SOLN
INTRAMUSCULAR | Status: DC
Start: 2014-04-18 — End: 2014-10-30

## 2014-04-18 MED ORDER — SILDENAFIL CITRATE 20 MG PO TABS: ORAL_TABLET | ORAL | Status: DC

## 2014-04-18 NOTE — Patient Instructions (Signed)

## 2014-04-18 NOTE — Progress Notes (Signed)
Patient ID: Christopher [name], male   DOB: May 27, 1956, 58 y.o.   MRN: [id number]   This very nice 58 y.o.DBM presents for 3 month follow up with Hypertension, Hyperlipidemia, Morbid Obesity,  Pre-Diabetes, Testosterone  and Vitamin D Deficiency.    Patient is treated for HTN & BP has been controlled at home. Today's BP: 116/82 mmHg. Patient denies any cardiac type chest pain, palpitations, dyspnea/orthopnea/PND, dizziness, claudication, or dependent edema.   Hyperlipidemia is controlled with diet & meds. Patient denies myalgias or other med SE's. Last Lipids were : Cholesterol 137; HDL  33*; LDL  74; Triglycerides 151 on  01/12/2014.   Also, the patient has history of Morbid Obesity (BMI 41.6) and consequent  PreDiabetes and patient denies any symptoms of reactive hypoglycemia, diabetic polys, paresthesias or visual blurring.  Last A1c was 5.8% on  01/12/2014.    Further, Patient has history of Vitamin D Deficiency and patient supplements vitamin D without any suspected side-effects. 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 Prevent Gout     ALPRAZolam 1 MG tablet  Commonly known as:  XANAX     aspirin 81 MG tablet  Take 81 mg by mouth daily.     cyclobenzaprine 10 MG tablet  Commonly known as:  FLEXERIL  Take 1/2 to 1 tab 3 x day for muscle spasms.     esomeprazole 40 MG capsule  Commonly known as:  NEXIUM  Take 1 capsule (40 mg total) by mouth 2 (two) times daily.     fenofibrate micronized 134 MG capsule  Commonly known as:  LOFIBRA  Take 134 mg by mouth daily before breakfast.     fish oil-omega-3 fatty acids 1000 MG capsule  Take 2 g by mouth daily.     furosemide 20 MG tablet  Commonly known as:  LASIX  Take 10 mg by mouth 2 (two) times daily as needed. For water  retension     magnesium oxide 400 MG tablet  Commonly known as:  MAG-OX  Take 400 mg by mouth daily.     montelukast 10 MG tablet  Commonly known as:  SINGULAIR  Take 1 tablet (10 mg total) by mouth at bedtime.     MULTIVITAMIN PO  Take 1 tablet by mouth daily.     phentermine 37.5 MG tablet  Commonly known as:  ADIPEX-P  TAKE 1 TABLET BY MOUTH EVERY DAY BEFORE BREAKFAST     potassium chloride 10 MEQ tablet  Commonly known as:  K-DUR  Take 1 tablet (10 mEq total) by mouth 3 (three) times daily.     sildenafil 20 MG tablet  Commonly known as:  REVATIO  Take 1 to 5 tablets daily as needed for XXXX     sucralfate 1 G tablet  Commonly known as:  CARAFATE  Take 1 g by mouth 4 (four) times daily.     testosterone cypionate 200 MG/ML injection  Commonly known as:  DEPO-TESTOSTERONE  1 & 1/2 cc intra muscular every 2 to 3 weeks as directed     valsartan-hydrochlorothiazide 160-25 MG per tablet  Commonly known as:  DIOVAN-HCT  Take 1 tablet by mouth daily. For BP     VITAMIN B 12  PO  Take 1 tablet by mouth daily.     vitamin C 500 MG tablet  Commonly known as:  ASCORBIC ACID  Take 500 mg by mouth 3 (three) times daily.     Vitamin D (Ergocalciferol) 50000 UNITS Caps capsule  Commonly known as:  DRISDOL  Take 50,000 Units by mouth daily. Only takes Monday-saturdays     Allergies  Allergen Reactions  . Hyzaar [Losartan Potassium-Hctz]    PMHx:   Past Medical History  Diagnosis Date  . Allergy   . GERD (gastroesophageal reflux disease)   . Hyperlipidemia   . Hypertension   . H/O hiatal hernia   . Prediabetes   . Gout   . Vitamin D deficiency    FHx:    Reviewed / unchanged SHx:    Reviewed / unchanged  Systems Review:  Constitutional: Denies fever, chills, wt changes, headaches, insomnia, fatigue, night sweats, change in appetite. Eyes: Denies redness, blurred vision, diplopia, discharge, itchy, watery eyes.  ENT: Denies discharge, congestion, post nasal  drip, epistaxis, sore throat, earache, hearing loss, dental pain, tinnitus, vertigo, sinus pain, snoring.  CV: Denies chest pain, palpitations, irregular heartbeat, syncope, dyspnea, diaphoresis, orthopnea, PND, claudication or edema. Respiratory: denies cough, dyspnea, DOE, pleurisy, hoarseness, laryngitis, wheezing.  Gastrointestinal: Denies dysphagia, odynophagia, heartburn, reflux, water brash, abdominal pain or cramps, nausea, vomiting, bloating, diarrhea, constipation, hematemesis, melena, hematochezia  or hemorrhoids. Genitourinary: Denies dysuria, frequency, urgency, nocturia, hesitancy, discharge, hematuria or flank pain. Musculoskeletal: Denies arthralgias, myalgias, stiffness, jt. swelling, pain, limping or strain/sprain.  Skin: Denies pruritus, rash, hives, warts, acne, eczema or change in skin lesion(s). Neuro: No weakness, tremor, incoordination, spasms, paresthesia or pain. Psychiatric: Denies confusion, memory loss or sensory loss. Endo: Denies change in weight, skin or hair change.  Heme/Lymph: No excessive bleeding, bruising or enlarged lymph nodes.  Exam:  BP 116/82  Pulse 88  Temp(Src) 97.7 F (36.5 C) (Temporal)  Resp 18  Ht 5' 11.75" (1.822 m)  Wt 304 lb 6.4 oz (138.075 kg)  BMI 41.59 kg/m2  Appears well nourished and in no distress. Eyes: PERRLA, EOMs, conjunctiva no swelling or erythema. Sinuses: No frontal/maxillary tenderness ENT/Mouth: EAC's clear, TM's nl w/o erythema, bulging. Nares clear w/o erythema, swelling, exudates. Oropharynx clear without erythema or exudates. Oral hygiene is good. Tongue normal, non obstructing. Hearing intact.  Neck: Supple. Thyroid nl. Car 2+/2+ without bruits, nodes or JVD. Chest: Respirations nl with BS clear & equal w/o rales, rhonchi, wheezing or stridor.  Cor: Heart sounds normal w/ regular rate and rhythm without sig. murmurs, gallops, clicks, or rubs. Peripheral pulses normal and equal  without edema.  Abdomen: Soft & bowel  sounds normal. Non-tender w/o guarding, rebound, hernias, masses, or organomegaly.  Lymphatics: Unremarkable.  Musculoskeletal: Full ROM all peripheral extremities, joint stability, 5/5 strength, and normal gait.  Skin: Warm, dry without exposed rashes, lesions or ecchymosis apparent.  Neuro: Cranial nerves intact, reflexes equal bilaterally. Sensory-motor testing grossly intact. Tendon reflexes grossly intact.  Pysch: Alert & oriented x 3. Insight and judgement nl & appropriate. No ideations.  Assessment and Plan:  1. Hypertension - Continue monitor blood pressure at home. Continue diet/meds same.  2. Hyperlipidemia - Continue diet/meds, exercise,& lifestyle modifications. Continue monitor periodic cholesterol/liver & renal functions   3. Pre-Diabetes - Continue diet, exercise, lifestyle modifications. Monitor appropriate labs.  4. Vitamin D Deficiency - Continue supplementation.  5. Testosterone Deficiency  6. Morbid Obesity  Recommended regular exercise, BP monitoring, weight control, and discussed med and SE's.  Recommended labs to assess and monitor clinical status. Further disposition pending results of labs.

## 2014-04-19 LAB — HEPATIC FUNCTION PANEL
ALK PHOS: 48 U/L (ref 39–117)
ALT: 24 U/L (ref 0–53)
AST: 21 U/L (ref 0–37)
Albumin: 4.5 g/dL (ref 3.5–5.2)
BILIRUBIN INDIRECT: 0.6 mg/dL (ref 0.2–1.2)
BILIRUBIN TOTAL: 0.8 mg/dL (ref 0.2–1.2)
Bilirubin, Direct: 0.2 mg/dL (ref 0.0–0.3)
Total Protein: 7.3 g/dL (ref 6.0–8.3)

## 2014-04-19 LAB — BASIC METABOLIC PANEL WITH GFR
BUN: 14 mg/dL (ref 6–23)
CHLORIDE: 102 meq/L (ref 96–112)
CO2: 26 mEq/L (ref 19–32)
Calcium: 9.9 mg/dL (ref 8.4–10.5)
Creat: 1.25 mg/dL (ref 0.50–1.35)
GFR, EST AFRICAN AMERICAN: 74 mL/min
GFR, Est Non African American: 64 mL/min
Glucose, Bld: 102 mg/dL — ABNORMAL HIGH (ref 70–99)
POTASSIUM: 4.2 meq/L (ref 3.5–5.3)
Sodium: 138 mEq/L (ref 135–145)

## 2014-04-19 LAB — LIPID PANEL
CHOLESTEROL: 148 mg/dL (ref 0–200)
HDL: 40 mg/dL (ref >39)
LDL CALC: 94 mg/dL (ref 0–99)
Total CHOL/HDL Ratio: 3.7 Ratio
Triglycerides: 72 mg/dL (ref <150)
VLDL: 14 mg/dL (ref 0–40)

## 2014-04-19 LAB — INSULIN, FASTING: Insulin fasting, serum: 18 u[IU]/mL (ref 3–28)

## 2014-04-19 LAB — VITAMIN D 25 HYDROXY (VIT D DEFICIENCY, FRACTURES): Vit D, 25-Hydroxy: 86 ng/mL (ref 30–89)

## 2014-04-19 LAB — TSH: TSH: 2.37 u[IU]/mL (ref 0.350–4.500)

## 2014-04-19 LAB — TESTOSTERONE: Testosterone: 947 ng/dL — ABNORMAL HIGH (ref 300–890)

## 2014-04-19 LAB — MAGNESIUM: Magnesium: 1.9 mg/dL (ref 1.5–2.5)

## 2014-06-02 ENCOUNTER — Encounter: Payer: Self-pay | Admitting: Gastroenterology

## 2014-06-28 ENCOUNTER — Encounter: Payer: Self-pay | Admitting: Internal Medicine

## 2014-06-28 ENCOUNTER — Ambulatory Visit (HOSPITAL_COMMUNITY)
Admission: RE | Admit: 2014-06-28 | Discharge: 2014-06-28 | Disposition: A | Discharge status: Home / Self Care | Payer: 59 | Source: Ambulatory Visit | Attending: Internal Medicine | Admitting: Internal Medicine

## 2014-06-28 ENCOUNTER — Ambulatory Visit (INDEPENDENT_AMBULATORY_CARE_PROVIDER_SITE_OTHER): Payer: 59 | Admitting: Internal Medicine

## 2014-06-28 ENCOUNTER — Encounter: Payer: Self-pay | Admitting: Gastroenterology

## 2014-06-28 VITALS — BP 136/86 | HR 88 | Temp 97.9°F | Resp 16 | Ht 72.25 in | Wt 312.6 lb

## 2014-06-28 DIAGNOSIS — I10 Essential (primary) hypertension: Secondary | ICD-10-CM | POA: Insufficient documentation

## 2014-06-28 DIAGNOSIS — R7303 Prediabetes: Secondary | ICD-10-CM

## 2014-06-28 DIAGNOSIS — Z1212 Encounter for screening for malignant neoplasm of rectum: Secondary | ICD-10-CM

## 2014-06-28 DIAGNOSIS — R945 Abnormal results of liver function studies: Secondary | ICD-10-CM

## 2014-06-28 DIAGNOSIS — E559 Vitamin D deficiency, unspecified: Secondary | ICD-10-CM

## 2014-06-28 DIAGNOSIS — R6889 Other general symptoms and signs: Secondary | ICD-10-CM

## 2014-06-28 DIAGNOSIS — Z87891 Personal history of nicotine dependence: Secondary | ICD-10-CM | POA: Insufficient documentation

## 2014-06-28 DIAGNOSIS — M1000 Idiopathic gout, unspecified site: Secondary | ICD-10-CM

## 2014-06-28 DIAGNOSIS — R059 Cough, unspecified: Secondary | ICD-10-CM

## 2014-06-28 DIAGNOSIS — R05 Cough: Secondary | ICD-10-CM

## 2014-06-28 DIAGNOSIS — E349 Endocrine disorder, unspecified: Secondary | ICD-10-CM

## 2014-06-28 DIAGNOSIS — Z111 Encounter for screening for respiratory tuberculosis: Secondary | ICD-10-CM

## 2014-06-28 DIAGNOSIS — E785 Hyperlipidemia, unspecified: Secondary | ICD-10-CM

## 2014-06-28 DIAGNOSIS — Z23 Encounter for immunization: Secondary | ICD-10-CM

## 2014-06-28 DIAGNOSIS — Z125 Encounter for screening for malignant neoplasm of prostate: Secondary | ICD-10-CM

## 2014-06-28 DIAGNOSIS — Z0001 Encounter for general adult medical examination with abnormal findings: Secondary | ICD-10-CM

## 2014-06-28 DIAGNOSIS — Z113 Encounter for screening for infections with a predominantly sexual mode of transmission: Secondary | ICD-10-CM

## 2014-06-28 DIAGNOSIS — R7989 Other specified abnormal findings of blood chemistry: Secondary | ICD-10-CM

## 2014-06-28 LAB — CBC WITH DIFFERENTIAL/PLATELET
Basophils Absolute: 0 10*3/uL (ref 0.0–0.1)
Basophils Relative: 0 % (ref 0–1)
EOS ABS: 0.1 10*3/uL (ref 0.0–0.7)
EOS PCT: 2 % (ref 0–5)
HEMATOCRIT: 41.2 % (ref 39.0–52.0)
HEMOGLOBIN: 14.7 g/dL (ref 13.0–17.0)
LYMPHS ABS: 2.8 10*3/uL (ref 0.7–4.0)
LYMPHS PCT: 42 % (ref 12–46)
MCH: 31.5 pg (ref 26.0–34.0)
MCHC: 35.7 g/dL (ref 30.0–36.0)
MCV: 88.2 fL (ref 78.0–100.0)
MONOS PCT: 14 % — AB (ref 3–12)
Monocytes Absolute: 0.9 10*3/uL (ref 0.1–1.0)
Neutro Abs: 2.8 10*3/uL (ref 1.7–7.7)
Neutrophils Relative %: 42 % — ABNORMAL LOW (ref 43–77)
PLATELETS: 136 10*3/uL — AB (ref 150–400)
RBC: 4.67 MIL/uL (ref 4.22–5.81)
RDW: 13.5 % (ref 11.5–15.5)
WBC: 6.7 10*3/uL (ref 4.0–10.5)

## 2014-06-28 NOTE — Progress Notes (Signed)
Patient ID: Christopher [name], male   DOB: 12-Jun-1956, 58 y.o.   MRN: [id number]  Annual Screening Comprehensive Examination  This very nice 58 y.o.DBM presents for complete physical.  Patient has been followed for HTN, Morbid Obesity,  Prediabetes, Hyperlipidemia, and Vitamin D Deficiency.   HTN predates since 2005. Patient's BP has been controlled at home.Today's BP: 136/86 mmHg. Patient denies any cardiac symptoms as chest pain, palpitations, shortness of breath, dizziness or ankle swelling.   Patient's hyperlipidemia is controlled with diet and medications. Patient denies myalgias or other medication SE's. Last lipids were at goal - Total Chol 148; HDL 40; LDL 94; Trig 72 on  04/18/2014.    Patient has Morbid Obesity (BMI 42.11)  And consequent  prediabetes since July 2012 w/A1c6.1% and patient denies reactive hypoglycemic symptoms, visual blurring, diabetic polys or paresthesias. Last A1c was 6.0% on 04/18/2014.    Finally, patient has history of Vitamin D Deficiency of 17 in 2008 and last vitamin D was 86 on 04/18/2014.    Medication Sig  . 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 allopurinol (ZYLOPRIM) 100 MG tablet Take 1 tablet (100 mg total) by mouth daily. Prevent Gout  . ALPRAZolam (XANAX) 1 MG tablet   . aspirin 81 MG tablet Take 81 mg by mouth daily.    . Cyanocobalamin (VITAMIN B 12 PO) Take 1 tablet by mouth daily.    . cyclobenzaprine (FLEXERIL) 10 MG tablet Take 1/2 to 1 tab 3 x day for muscle spasms.  Marland Kitchen esomeprazole (NEXIUM) 40 MG capsule Take 1 capsule (40 mg total) by mouth 2 (two) times daily.  . fenofibrate micronized (LOFIBRA) 134 MG capsule Take 134 mg by mouth daily before breakfast.  . fish oil-omega-3 fatty acids 1000 MG capsule Take 2 g by mouth daily.    . furosemide (LASIX) 20 MG tablet Take 10 mg by mouth 2 (two) times daily as needed. For water retension  . magnesium oxide (MAG-OX) 400 MG tablet Take 400 mg by mouth daily.  .  montelukast (SINGULAIR) 10 MG tablet Take 1 tablet (10 mg total) by mouth at bedtime.  . Multiple Vitamin (MULTIVITAMIN PO) Take 1 tablet by mouth daily.    . phentermine (ADIPEX-P) 37.5 MG tablet TAKE 1 TABLET BY MOUTH EVERY DAY BEFORE BREAKFAST  . potassium chloride (K-DUR) 10 MEQ tablet Take 1 tablet (10 mEq total) by mouth 3 (three) times daily.  . sildenafil (REVATIO) 20 MG tablet Take 1 to 5 tablets daily as needed for XXXX  . sucralfate (CARAFATE) 1 G tablet Take 1 g by mouth 4 (four) times daily.    Marland Kitchen testosterone cypionate (DEPO-TESTOSTERONE) 200 MG/ML injection 1 & 1/2 cc intra muscular every 2 to 3 weeks as directed  . valsartan-hydrochlorothiazide (DIOVAN-HCT) 160-25 MG per tablet Take 1 tablet by mouth daily. For BP  . vitamin C (ASCORBIC ACID) 500 MG tablet Take 500 mg by mouth 3 (three) times daily.  . Vitamin D, Ergocalciferol, (DRISDOL) 50000 UNITS CAPS Take 50,000 Units by mouth daily. Only takes Monday-saturdays   Allergies  Allergen Reactions  . Hyzaar [Losartan Potassium-Hctz]    Past Medical History  Diagnosis Date  . Allergy   . GERD (gastroesophageal reflux disease)   . Hyperlipidemia   . Hypertension   . H/O hiatal hernia   . Prediabetes   . Gout   . Vitamin D deficiency    Health Maintenance  Topic Date Due  . Colonoscopy  11/08/2007  . Influenza Vaccine  04/01/2014  . Tetanus/tdap  08/02/2023   Immunization History  Administered Date(s) Administered  . Influenza Split 06/28/2014  . Influenza-Unspecified 09/01/2012  . PPD Test 06/28/2014  . Pneumococcal-Unspecified 09/01/1998  . Tdap 08/01/2013    Past Surgical History  Procedure Laterality Date  . Upper gastrointestinal endoscopy    . Colonoscopy     Family History  Problem Relation Age of Onset  . Esophageal cancer Neg Hx   . Rectal cancer Neg Hx   . Stomach cancer Neg Hx   . Colon cancer Maternal Uncle   . Arthritis Mother   . Cancer Mother     lung  . Heart disease Mother   .  Stroke Brother    History   Social History  . Marital Status: Single    Spouse Name: N/A    Number of Children: N/A  . Years of Education: N/A   Occupational History  . 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   Social History Main Topics  . Smoking status: Former Smoker -- 6 years    Types: Cigarettes    Quit date: 02/07/2013  . Smokeless tobacco: Never Used  . Alcohol Use: Yes     Comment: occassional  . Drug Use: No  . Sexual Activity: Active    ROS Constitutional: Denies fever, chills, weight loss/gain, headaches, insomnia, fatigue, night sweats or change in appetite. Eyes: Denies redness, blurred vision, diplopia, discharge, itchy or watery eyes.  ENT: Denies discharge, congestion, post nasal drip, epistaxis, sore throat, earache, hearing loss, dental pain, Tinnitus, Vertigo, Sinus pain or snoring.  Cardio: Denies chest pain, palpitations, irregular heartbeat, syncope, dyspnea, diaphoresis, orthopnea, PND, claudication or edema Respiratory: denies cough, dyspnea, DOE, pleurisy, hoarseness, laryngitis or wheezing.  Gastrointestinal: Denies dysphagia, heartburn, reflux, water brash, pain, cramps, nausea, vomiting, bloating, diarrhea, constipation, hematemesis, melena, hematochezia, jaundice or hemorrhoids Genitourinary: Denies dysuria, frequency, urgency, nocturia, hesitancy, discharge, hematuria or flank pain Musculoskeletal: Denies arthralgia, myalgia, stiffness, Jt. Swelling, pain, limp or strain/sprain. Denies Falls. Skin: Denies puritis, rash, hives, warts, acne, eczema or change in skin lesion Neuro: No weakness, tremor, incoordination, spasms, paresthesia or pain Psychiatric: Denies confusion, memory loss or sensory loss. Denies Depression. Endocrine: Denies change in weight, skin, hair change, nocturia, and paresthesia, diabetic polys, visual blurring or hyper / hypo glycemic episodes.  Heme/Lymph: No excessive bleeding, bruising or enlarged lymph  nodes.  Physical Exam  BP 136/86  Pulse 88  Temp(Src) 97.9 F (36.6 C) (Temporal)  Resp 16  Ht 6' 0.25" (1.835 m)  Wt 312 lb 9.6 oz (141.794 kg)  BMI 42.11 kg/m2  General Appearance: Over nourished, in no apparent distress. Eyes: PERRLA, EOMs, conjunctiva no swelling or erythema, normal fundi and vessels. Sinuses: No frontal/maxillary tenderness ENT/Mouth: EACs patent / TMs  nl. Nares clear without erythema, swelling, mucoid exudates. Oral hygiene is good. No erythema, swelling, or exudate. Tongue normal, non-obstructing. Tonsils not swollen or erythematous. Hearing normal.  Neck: Supple, thyroid normal. No bruits, nodes or JVD. Respiratory: Respiratory effort normal.  BS equal and clear bilateral without rales, rhonci, wheezing or stridor. Cardio: Heart sounds are normal with regular rate and rhythm and no murmurs, rubs or gallops. Peripheral pulses are normal and equal bilaterally without edema. No aortic or femoral bruits. Chest: symmetric with normal excursions and percussion.  Abdomen: Flat, soft, with bowl sounds. Nontender, no guarding, rebound, hernias, masses, or organomegaly.  Lymphatics: Non tender without lymphadenopathy.  Genitourinary: No hernias.Testes nl. DRE -  prostate nl for age - smooth & firm w/o nodules. Musculoskeletal: Full ROM all peripheral extremities, joint stability, 5/5 strength, and normal gait. Skin: Warm and dry without rashes, lesions, cyanosis, clubbing or  ecchymosis.  Neuro: Cranial nerves intact, reflexes equal bilaterally. Normal muscle tone, no cerebellar symptoms. Sensation intact.  Pysch: Awake and oriented X 3with normal affect, insight and judgment appropriate.   Assessment and Plan  1. Annual Screening Examination 2. Hypertension  3. Hyperlipidemia 4. Pre Diabetes 5. Vitamin D Deficiency 6. Testosterone Deficiency 7. Gout 8. Morbid Obesity (BMI 42.11)   Continue prudent diet as discussed, weight control, BP monitoring, regular  exercise, and medications as discussed.  Discussed med effects and SE's. Routine screening labs and tests as requested with regular follow-up as recommended.

## 2014-06-28 NOTE — Patient Instructions (Signed)
Recommend the book "The END of DIETING" by Dr Baker Janus   and the book "The END of DIABETES " by Dr Excell Seltzer  At [email or web address] - get book & Audio CD's      Being diabetic has a  300% increased risk for heart attack, stroke, cancer, and alzheimer- type vascular dementia. It is very important that you work harder with diet by avoiding all foods that are white except chicken & fish. Avoid white rice (brown & wild rice is OK), white potatoes (sweetpotatoes in moderation is OK), White bread or wheat bread or anything made out of white flour like bagels, donuts, rolls, buns, biscuits, cakes, pastries, cookies, pizza crust, and pasta (made from white flour & egg whites) - vegetarian pasta or spinach or wheat pasta is OK. Multigrain breads like Arnold's or Pepperidge Farm, or multigrain sandwich thins or flatbreads.  Diet, exercise and weight loss can reverse and cure diabetes in the early stages.  Diet, exercise and weight loss is very important in the control and prevention of complications of diabetes which affects every system in your body, ie. Brain - dementia/stroke, eyes - glaucoma/blindness, heart - heart attack/heart failure, kidneys - dialysis, stomach - gastric paralysis, intestines - malabsorption, nerves - severe painful neuritis, circulation - gangrene & loss of a leg(s), and finally cancer and Alzheimers.    I recommend avoid fried & greasy foods,  sweets/candy, white rice (brown or wild rice or Quinoa is OK), white potatoes (sweet potatoes are OK) - anything made from white flour - bagels, doughnuts, rolls, buns, biscuits,white and wheat breads, pizza crust and traditional pasta made of white flour & egg white(vegetarian pasta or spinach or wheat pasta is OK).  Multi-grain bread is OK - like multi-grain flat bread or sandwich thins. Avoid alcohol in excess. Exercise is also important.    Eat all the vegetables you want - avoid meat, especially red meat and dairy - especially cheese.  Cheese  is the most concentrated form of trans-fats which is the worst thing to clog up our arteries. Veggie cheese is OK which can be found in the fresh produce section at [employer] or [employer] or [employer]   Preventive Care for Adults  A healthy lifestyle and preventive care can promote health and wellness. Preventive health guidelines for men include the following key practices:  A routine yearly physical is a good way to check with your health care provider about your health and preventative screening. It is a chance to share any concerns and updates on your health and to receive a thorough exam.  Visit your dentist for a routine exam and preventative care every 6 months. Brush your teeth twice a day and floss once a day. Good oral hygiene prevents tooth decay and gum disease.  The frequency of eye exams is based on your age, health, family medical history, use of contact lenses, and other factors. Follow your health care provider's recommendations for frequency of eye exams.  Eat a healthy diet. Foods such as vegetables, fruits, whole grains, low-fat dairy products, and lean protein foods contain the nutrients you need without too many calories. Decrease your intake of foods high in solid fats, added sugars, and salt. Eat the right amount of calories for you.Get information about a proper diet from your health care provider, if necessary.  Regular physical exercise is one of the most important things you can do for your health. Most adults should get at least 150 minutes of moderate-intensity exercise (any  increases your heart rate and causes you to sweat) each week. In addition, most adults need muscle-strengthening exercises on 2 or more days a week.  Maintain a healthy weight. The body mass index (BMI) is a screening tool to identify possible weight problems. It provides an estimate of body fat based on height and weight. Your health care provider can find your BMI and can help you  achieve or maintain a healthy weight.For adults 20 years and older:  A BMI below 18.5 is considered underweight.  A BMI of 18.5 to 24.9 is normal.  A BMI of 25 to 29.9 is considered overweight.  A BMI of 30 and above is considered obese.  Maintain normal blood lipids and cholesterol levels by exercising and minimizing your intake of saturated fat. Eat a balanced diet with plenty of fruit and vegetables. Blood tests for lipids and cholesterol should begin at age 20 and be repeated every 5 years. If your lipid or cholesterol levels are high, you are over 50, or you are at high risk for heart disease, you may need your cholesterol levels checked more frequently.Ongoing high lipid and cholesterol levels should be treated with medicines if diet and exercise are not working.  If you smoke, find out from your health care provider how to quit. If you do not use tobacco, do not start.  Lung cancer screening is recommended for adults aged 55-80 years who are at high risk for developing lung cancer because of a history of smoking. A yearly low-dose CT scan of the lungs is recommended for people who have at least a 30-pack-year history of smoking and are a current smoker or have quit within the past 15 years. A pack year of smoking is smoking an average of 1 pack of cigarettes a day for 1 year (for example: 1 pack a day for 30 years or 2 packs a day for 15 years). Yearly screening should continue until the smoker has stopped smoking for at least 15 years. Yearly screening should be stopped for people who develop a health problem that would prevent them from having lung cancer treatment.  If you choose to drink alcohol, do not have more than 2 drinks per day. One drink is considered to be 12 ounces (355 mL) of beer, 5 ounces (148 mL) of wine, or 1.5 ounces (44 mL) of liquor.  Avoid use of street drugs. Do not share needles with anyone. Ask for help if you need support or instructions about stopping the use of  drugs.  High blood pressure causes heart disease and increases the risk of stroke. Your blood pressure should be checked at least every 1-2 years. Ongoing high blood pressure should be treated with medicines, if weight loss and exercise are not effective.  If you are 45-79 years old, ask your health care provider if you should take aspirin to prevent heart disease.  Diabetes screening involves taking a blood sample to check your fasting blood sugar level. This should be done once every 3 years, after age 45, if you are within normal weight and without risk factors for diabetes. Testing should be considered at a younger age or be carried out more frequently if you are overweight and have at least 1 risk factor for diabetes.  Colorectal cancer can be detected and often prevented. Most routine colorectal cancer screening begins at the age of 50 and continues through age 75. However, your health care provider may recommend screening at an earlier age if you have risk   have risk factors for colon cancer. On a yearly basis, your health care provider may provide home test kits to check for hidden blood in the stool. Use of a small camera at the end of a tube to directly examine the colon (sigmoidoscopy or colonoscopy) can detect the earliest forms of colorectal cancer. Talk to your health care provider about this at age 48, when routine screening begins. Direct exam of the colon should be repeated every 5-10 years through age 42, unless early forms of precancerous polyps or small growths are found.   Talk with your health care provider about prostate cancer screening.  Testicular cancer screening isrecommended for adult males. Screening includes self-exam, a health care provider exam, and other screening tests. Consult with your health care provider about any symptoms you have or any concerns you have about testicular cancer.  Use sunscreen. Apply sunscreen liberally and repeatedly throughout the day. You should  seek shade when your shadow is shorter than you. Protect yourself by wearing long sleeves, pants, a wide-brimmed hat, and sunglasses year round, whenever you are outdoors.  Once a month, do a whole-body skin exam, using a mirror to look at the skin on your back. Tell your health care provider about new moles, moles that have irregular borders, moles that are larger than a pencil eraser, or moles that have changed in shape or color.  Stay current with required vaccines (immunizations).  Influenza vaccine. All adults should be immunized every year.  Tetanus, diphtheria, and acellular pertussis (Td, Tdap) vaccine. An adult who has not previously received Tdap or who does not know his vaccine status should receive 1 dose of Tdap. This initial dose should be followed by tetanus and diphtheria toxoids (Td) booster doses every 10 years. Adults with an unknown or incomplete history of completing a 3-dose immunization series with Td-containing vaccines should begin or complete a primary immunization series including a Tdap dose. Adults should receive a Td booster every 10 years.    Zoster vaccine. One dose is recommended for adults aged 85 years or older unless certain conditions are present.    PREVNAR  - Pneumococcal 13-valent conjugate (PCV13) vaccine. When indicated, a person who is uncertain of his immunization history and has no record of immunization should receive the PCV13 vaccine. An adult aged 15 years or older who has certain medical conditions and has not been previously immunized should receive 1 dose of PCV13 vaccine. This PCV13 should be followed with a dose of pneumococcal polysaccharide (PPSV23) vaccine. The PPSV23 vaccine dose should be obtained at least 8 weeks after the dose of PCV13 vaccine. An adult aged 42 years or older who has certain medical conditions and previously received 1 or more doses of PPSV23 vaccine should receive 1 dose of PCV13. The PCV13 vaccine dose should be obtained  1 or more years after the last PPSV23 vaccine dose.    PNEUMOVAX - Pneumococcal polysaccharide (PPSV23) vaccine. When PCV13 is also indicated, PCV13 should be obtained first. All adults aged 61 years and older should be immunized. An adult younger than age 45 years who has certain medical conditions should be immunized. Any person who resides in a nursing home or long-term care facility should be immunized. An adult smoker should be immunized. People with an immunocompromised condition and certain other conditions should receive both PCV13 and PPSV23 vaccines. People with human immunodeficiency virus (HIV) infection should be immunized as soon as possible after diagnosis. Immunization during chemotherapy or radiation therapy should be avoided. Routine  use of PPSV23 vaccine is not recommended for American Indians, [place] Natives, or people younger than 65 years unless there are medical conditions that require PPSV23 vaccine. When indicated, people who have unknown immunization and have no record of immunization should receive PPSV23 vaccine. One-time revaccination 5 years after the first dose of PPSV23 is recommended for people aged 19-64 years who have chronic kidney failure, nephrotic syndrome, asplenia, or immunocompromised conditions. People who received 1-2 doses of PPSV23 before age 75 years should receive another dose of PPSV23 vaccine at age 81 years or later if at least 5 years have passed since the previous dose. Doses of PPSV23 are not needed for people immunized with PPSV23 at or after age 40 years.    Hepatitis A vaccine. Adults who wish to be protected from this disease, have certain high-risk conditions, work with hepatitis A-infected animals, work in hepatitis A research labs, or travel to or work in countries with a high rate of hepatitis A should be immunized. Adults who were previously unvaccinated and who anticipate close contact with an international adoptee during the first 60 days after  arrival in the [place] from a country with a high rate of hepatitis A should be immunized.    Hepatitis B vaccine. Adults should be immunized if they wish to be protected from this disease, have certain high-risk conditions, may be exposed to blood or other infectious body fluids, are household contacts or sex partners of hepatitis B positive people, are clients or workers in certain care facilities, or travel to or work in countries with a high rate of hepatitis B.   Preventive Service / Frequency   Ages 19 to 39  Blood pressure check.  Lipid and cholesterol check.  Lung cancer screening. / Every year if you are aged 14-80 years and have a 30-pack-year history of smoking and currently smoke or have quit within the past 15 years. Yearly screening is stopped once you have quit smoking for at least 15 years or develop a health problem that would prevent you from having lung cancer treatment.  Fecal occult blood test (FOBT) of stool. / Every year beginning at age 106 and continuing until age 38. You may not have to do this test if you get a colonoscopy every 10 years.  Flexible sigmoidoscopy** or colonoscopy.** / Every 5 years for a flexible sigmoidoscopy or every 10 years for a colonoscopy beginning at age 74 and continuing until age 60.

## 2014-06-29 LAB — PSA: PSA: 0.73 ng/mL (ref <=4.00)

## 2014-06-29 LAB — TSH: TSH: 2.146 u[IU]/mL (ref 0.350–4.500)

## 2014-06-29 LAB — MAGNESIUM: Magnesium: 1.8 mg/dL (ref 1.5–2.5)

## 2014-06-29 LAB — BASIC METABOLIC PANEL WITH GFR
BUN: 18 mg/dL (ref 6–23)
CO2: 26 meq/L (ref 19–32)
CREATININE: 1.33 mg/dL (ref 0.50–1.35)
Calcium: 9.5 mg/dL (ref 8.4–10.5)
Chloride: 101 mEq/L (ref 96–112)
GFR, Est African American: 69 mL/min
GFR, Est Non African American: 59 mL/min — ABNORMAL LOW
GLUCOSE: 110 mg/dL — AB (ref 70–99)
Potassium: 4.3 mEq/L (ref 3.5–5.3)
Sodium: 137 mEq/L (ref 135–145)

## 2014-06-29 LAB — HEMOGLOBIN A1C
HEMOGLOBIN A1C: 5.7 % — AB (ref <5.7)
Mean Plasma Glucose: 117 mg/dL — ABNORMAL HIGH (ref <117)

## 2014-06-29 LAB — URINALYSIS, MICROSCOPIC ONLY
Bacteria, UA: NONE SEEN
Casts: NONE SEEN
Crystals: NONE SEEN
Squamous Epithelial / LPF: NONE SEEN

## 2014-06-29 LAB — HEPATIC FUNCTION PANEL
ALT: 25 U/L (ref 0–53)
AST: 24 U/L (ref 0–37)
Albumin: 4.5 g/dL (ref 3.5–5.2)
Alkaline Phosphatase: 53 U/L (ref 39–117)
BILIRUBIN DIRECT: 0.1 mg/dL (ref 0.0–0.3)
Indirect Bilirubin: 0.4 mg/dL (ref 0.2–1.2)
Total Bilirubin: 0.5 mg/dL (ref 0.2–1.2)
Total Protein: 7 g/dL (ref 6.0–8.3)

## 2014-06-29 LAB — LIPID PANEL
CHOL/HDL RATIO: 4.4 ratio
Cholesterol: 158 mg/dL (ref 0–200)
HDL: 36 mg/dL — AB (ref >39)
LDL CALC: 88 mg/dL (ref 0–99)
Triglycerides: 169 mg/dL — ABNORMAL HIGH (ref <150)
VLDL: 34 mg/dL (ref 0–40)

## 2014-06-29 LAB — VITAMIN B12: Vitamin B-12: 528 pg/mL (ref 211–911)

## 2014-06-29 LAB — VITAMIN D 25 HYDROXY (VIT D DEFICIENCY, FRACTURES): Vit D, 25-Hydroxy: 71 ng/mL (ref 30–89)

## 2014-06-29 LAB — IRON AND TIBC
%SAT: 20 % (ref 20–55)
Iron: 69 ug/dL (ref 42–165)
TIBC: 338 ug/dL (ref 215–435)
UIBC: 269 ug/dL (ref 125–400)

## 2014-06-29 LAB — URIC ACID: Uric Acid, Serum: 6.5 mg/dL (ref 4.0–7.8)

## 2014-06-29 LAB — MICROALBUMIN / CREATININE URINE RATIO
CREATININE, URINE: 203.2 mg/dL
MICROALB UR: 0.4 mg/dL (ref <2.0)
MICROALB/CREAT RATIO: 2 mg/g (ref 0.0–30.0)

## 2014-06-29 LAB — TESTOSTERONE: Testosterone: 782 ng/dL (ref 300–890)

## 2014-06-29 LAB — INSULIN, FASTING: Insulin fasting, serum: 36.4 u[IU]/mL — ABNORMAL HIGH (ref 2.0–19.6)

## 2014-07-23 ENCOUNTER — Other Ambulatory Visit: Payer: Self-pay | Admitting: Physician Assistant

## 2014-08-07 ENCOUNTER — Ambulatory Visit (AMBULATORY_SURGERY_CENTER): Payer: Self-pay | Admitting: *Deleted

## 2014-08-07 VITALS — Ht 72.0 in | Wt 311.6 lb

## 2014-08-07 DIAGNOSIS — K2270 Barrett's esophagus without dysplasia: Secondary | ICD-10-CM

## 2014-08-07 NOTE — Progress Notes (Signed)
Pt has no egg or soy allergy. ewm No home 02 or cpap use. ewm Pt does take phentermine. Pt's last dose was 08-05-14 per pt. He is aware he must remain off this for his egd due to sedation. ewm No blood thinners. ewm Pt declined emmi video. ewm

## 2014-08-16 ENCOUNTER — Encounter: Payer: Self-pay | Admitting: Gastroenterology

## 2014-08-16 ENCOUNTER — Ambulatory Visit (AMBULATORY_SURGERY_CENTER): Payer: 59 | Admitting: Gastroenterology

## 2014-08-16 VITALS — BP 119/91 | HR 92 | Temp 98.6°F | Resp 19 | Ht 72.0 in | Wt 311.0 lb

## 2014-08-16 DIAGNOSIS — K2270 Barrett's esophagus without dysplasia: Secondary | ICD-10-CM

## 2014-08-16 MED ORDER — SODIUM CHLORIDE 0.9 % IV SOLN: 500.0000 mL | INTRAVENOUS | Status: DC

## 2014-08-16 NOTE — Progress Notes (Signed)
Called to room to assist during endoscopic procedure.  Patient ID and intended procedure confirmed with present staff. Received instructions for my participation in the procedure from the performing physician.  

## 2014-08-16 NOTE — Progress Notes (Signed)
A/ox3 pleased with MAC, report to Celia RN 

## 2014-08-16 NOTE — Patient Instructions (Signed)
Discharge instructions given. Biopsies taken. Resume previous medications. YOU HAD AN ENDOSCOPIC PROCEDURE TODAY AT THE [facility]: Refer to the procedure report that was given to you for any specific questions about what was found during the examination.  If the procedure report does not answer your questions, please call your gastroenterologist to clarify.  If you requested that your care partner not be given the details of your procedure findings, then the procedure report has been included in a sealed envelope for you to review at your convenience later.  YOU SHOULD EXPECT: Some feelings of bloating in the abdomen. Passage of more gas than usual.  Walking can help get rid of the air that was put into your GI tract during the procedure and reduce the bloating. If you had a lower endoscopy (such as a colonoscopy or flexible sigmoidoscopy) you may notice spotting of blood in your stool or on the toilet paper. If you underwent a bowel prep for your procedure, then you may not have a normal bowel movement for a few days.  DIET: Your first meal following the procedure should be a light meal and then it is ok to progress to your normal diet.  A half-sandwich or bowl of soup is an example of a good first meal.  Heavy or fried foods are harder to digest and may make you feel nauseous or bloated.  Likewise meals heavy in dairy and vegetables can cause extra gas to form and this can also increase the bloating.  Drink plenty of fluids but you should avoid alcoholic beverages for 24 hours.  ACTIVITY: Your care partner should take you home directly after the procedure.  You should plan to take it easy, moving slowly for the rest of the day.  You can resume normal activity the day after the procedure however you should NOT DRIVE or use heavy machinery for 24 hours (because of the sedation medicines used during the test).    SYMPTOMS TO REPORT IMMEDIATELY: A gastroenterologist can be reached at any  hour.  During normal business hours, 8:30 AM to 5:00 PM Monday through Friday, call [phone].  After hours and on weekends, please call the GI answering service at [phone] who will take a message and have the physician on call contact you.   Following upper endoscopy (EGD)  Vomiting of blood or coffee ground material  New chest pain or pain under the shoulder blades  Painful or persistently difficult swallowing  New shortness of breath  Fever of 100F or higher  Black, tarry-looking stools  FOLLOW UP: If any biopsies were taken you will be contacted by phone or by letter within the next 1-3 weeks.  Call your gastroenterologist if you have not heard about the biopsies in 3 weeks.  Our staff will call the home number listed on your records the next business day following your procedure to check on you and address any questions or concerns that you may have at that time regarding the information given to you following your procedure. This is a courtesy call and so if there is no answer at the home number and we have not heard from you through the emergency physician on call, we will assume that you have returned to your regular daily activities without incident.  SIGNATURES/CONFIDENTIALITY: You and/or your care partner have signed paperwork which will be entered into your electronic medical record.  These signatures attest to the fact that that the information above on your After Visit Summary has   been reviewed and is understood.  Full responsibility of the confidentiality of this discharge information lies with you and/or your care-partner.

## 2014-08-16 NOTE — Op Note (Signed)
[place]  [address] [place], [address]   ENDOSCOPY PROCEDURE REPORT  PATIENT: Christopher [name], Christopher [name]  MR#: [id number] BIRTHDATE: 07-11-1956 , 58  yrs. old GENDER: male ENDOSCOPIST: Inda Castle, MD REFERRED BY:  Unk Pinto, M.D. PROCEDURE DATE:  08/16/2014 PROCEDURE:  EGD w/ biopsy ASA CLASS:     Class II INDICATIONS:  history of Barrett's esophagus. MEDICATIONS: Monitored anesthesia care and Propofol 200 mg IV TOPICAL ANESTHETIC:  DESCRIPTION OF PROCEDURE: After the risks benefits and alternatives of the procedure were thoroughly explained, informed consent was obtained.  The LB [id number] endoscope was introduced through the mouth and advanced to the second portion of the duodenum , Without limitations.  The instrument was slowly withdrawn as the mucosa was fully examined.    ESOPHAGUS: There was evidence of Barrett's esophagus without dysplasia found in the distal esophagus.  The length of circumferential Barrett's was 2cm ([place] C3). C-line was at 36 cm. There is several islands of gastric appearing mucosa proximal to the GE junction. There was no nodular mucosa noted in the Barrett's segment.  Multiple biopsies were performed.   Except for the findings listed the EGD was otherwise normal.  Retroflexed views revealed no abnormalities.     The scope was then withdrawn from the patient and the procedure completed.  COMPLICATIONS: There were no immediate complications.  ENDOSCOPIC IMPRESSION: 1.   There was Barrett's esophagus w/o dysplasia found in the distal esophagus; multiple biopsies were performed 2.   EGD was otherwise normal  RECOMMENDATIONS: Await pathology results  REPEAT EXAM:  eSigned:  Inda Castle, MD 08/16/2014 2:54 PM    CC:

## 2014-08-17 ENCOUNTER — Telehealth: Payer: Self-pay | Admitting: *Deleted

## 2014-08-17 NOTE — Telephone Encounter (Signed)
  Follow up Call-  Call back number 08/16/2014  Post procedure Call Back phone  # [phone]  Permission to leave phone message Yes    No Answer and answering machine did not pick up

## 2014-08-27 ENCOUNTER — Encounter: Payer: Self-pay | Admitting: Gastroenterology

## 2014-08-29 ENCOUNTER — Ambulatory Visit: Payer: Self-pay | Admitting: Physician Assistant

## 2014-09-13 ENCOUNTER — Telehealth: Payer: Self-pay | Admitting: Gastroenterology

## 2014-09-13 NOTE — Telephone Encounter (Signed)
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 Please advise.

## 2014-09-14 NOTE — Telephone Encounter (Signed)
Yes but this is of no particular significance

## 2014-09-14 NOTE — Telephone Encounter (Signed)
Patient advised. He asks about surgical repair. He states he has what he considers to be significant reflux. Offered an appointment to discuss these concerns with Dr Deatra Ina. Declines at this time. Discussed briefly the recommendations for supportive treatment with GERD and taking of the medications.

## 2014-09-28 ENCOUNTER — Encounter: Payer: Self-pay | Admitting: Physician Assistant

## 2014-09-28 ENCOUNTER — Ambulatory Visit (INDEPENDENT_AMBULATORY_CARE_PROVIDER_SITE_OTHER): Payer: 59 | Admitting: Physician Assistant

## 2014-09-28 VITALS — BP 138/88 | HR 96 | Temp 97.7°F | Resp 16 | Ht 72.25 in | Wt 311.0 lb

## 2014-09-28 DIAGNOSIS — I10 Essential (primary) hypertension: Secondary | ICD-10-CM

## 2014-09-28 DIAGNOSIS — N183 Chronic kidney disease, stage 3 (moderate): Secondary | ICD-10-CM

## 2014-09-28 DIAGNOSIS — Z79899 Other long term (current) drug therapy: Secondary | ICD-10-CM

## 2014-09-28 DIAGNOSIS — R7303 Prediabetes: Secondary | ICD-10-CM

## 2014-09-28 DIAGNOSIS — E559 Vitamin D deficiency, unspecified: Secondary | ICD-10-CM

## 2014-09-28 DIAGNOSIS — R7309 Other abnormal glucose: Secondary | ICD-10-CM

## 2014-09-28 DIAGNOSIS — Z87891 Personal history of nicotine dependence: Secondary | ICD-10-CM

## 2014-09-28 DIAGNOSIS — N1831 Chronic kidney disease, stage 3a: Secondary | ICD-10-CM

## 2014-09-28 DIAGNOSIS — E785 Hyperlipidemia, unspecified: Secondary | ICD-10-CM

## 2014-09-28 DIAGNOSIS — E349 Endocrine disorder, unspecified: Secondary | ICD-10-CM

## 2014-09-28 MED ORDER — FENOFIBRATE 160 MG PO TABS: 160.0000 mg | ORAL_TABLET | Freq: Every day | ORAL | Status: DC

## 2014-09-28 MED ORDER — PHENTERMINE HCL 37.5 MG PO TABS: 37.5000 mg | ORAL_TABLET | Freq: Every day | ORAL | Status: DC

## 2014-09-28 NOTE — Patient Instructions (Signed)
Before you even begin to attack a weight-loss plan, it pays to remember this: You are not fat. You have fat. Losing weight isn't about blame or shame; it's simply another achievement to accomplish. Dieting is like any other skill-you have to buckle down and work at it. As long as you act in a smart, reasonable way, you'll ultimately get where you want to be. Here are some weight loss pearls for you.  1. It's Not a Diet. It's a Lifestyle Thinking of a diet as something you're on and suffering through only for the short term doesn't work. To shed weight and keep it off, you need to make permanent changes to the way you eat. It's OK to indulge occasionally, of course, but if you cut calories temporarily and then revert to your old way of eating, you'll gain back the weight quicker than you can say yo-yo. Use it to lose it. Research shows that one of the best predictors of long-term weight loss is how many pounds you drop in the first month. For that reason, nutritionists often suggest being stricter for the first two weeks of your new eating strategy to build momentum. Cut out added sugar and alcohol and avoid unrefined carbs. After that, figure out how you can reincorporate them in a way that's healthy and maintainable.  2. There's a Right Way to Exercise Working out burns calories and fat and boosts your metabolism by building muscle. But those trying to lose weight are notorious for overestimating the number of calories they burn and underestimating the amount they take in. Unfortunately, your system is biologically programmed to hold on to extra pounds and that means when you start exercising, your body senses the deficit and ramps up its hunger signals. If you're not diligent, you'll eat everything you burn and then some. Use it to lose it. Cardio gets all the exercise glory, but strength and interval training are the real heroes. They help you build lean muscle, which in turn increases your metabolism and  calorie-burning ability 3. Don't Overreact to Mild Hunger Some people have a hard time losing weight because of hunger anxiety. To them, being hungry is bad-something to be avoided at all costs-so they carry snacks with them and eat when they don't need to. Others eat because they're stressed out or bored. While you never want to get to the point of being ravenous (that's when bingeing is likely to happen), a hunger pang, a craving, or the fact that it's 3:00 p.m. should not send you racing for the vending machine or obsessing about the energy bar in your purse. Ideally, you should put off eating until your stomach is growling and it's difficult to concentrate.  Use it to lose it. When you feel the urge to eat, use the HALT method. Ask yourself, Am I really hungry? Or am I angry or anxious, lonely or bored, or tired? If you're still not certain, try the apple test. If you're truly hungry, an apple should seem delicious; if it doesn't, something else is going on. Or you can try drinking water and making yourself busy, if you are still hungry try a healthy snack.  4. Not All Calories Are Created Equal The mechanics of weight loss are pretty simple: Take in fewer calories than you use for energy. But the kind of food you eat makes all the difference. Processed food that's high in saturated fat and refined starch or sugar can cause inflammation that disrupts the hormone signals that tell  your brain you're full. The result: You eat a lot more.  Use it to lose it. Clean up your diet. Swap in whole, unprocessed foods, including vegetables, lean protein, and healthy fats that will fill you up and give you the biggest nutritional bang for your calorie buck. In a few weeks, as your brain starts receiving regular hunger and fullness signals once again, you'll notice that you feel less hungry overall and naturally start cutting back on the amount you eat.  5. Protein, Produce, and Plant-Based Fats Are Your Weight-Loss  [place] Here's why eating the three Ps regularly will help you drop pounds. Protein fills you up. You need it to build lean muscle, which keeps your metabolism humming so that you can torch more fat. People in a weight-loss program who ate double the recommended daily allowance for protein (about 110 grams for a 150-pound woman) lost 70 percent of their weight from fat, while people who ate the RDA lost only about 40 percent, one study found. Produce is packed with filling fiber. "It's very difficult to consume too many calories if you're eating a lot of vegetables. Example: Three cups of broccoli is a lot of food, yet only 93 calories. (Fruit is another story. It can be easy to overeat and can contain a lot of calories from sugar, so be sure to monitor your intake.) Plant-based fats like olive oil and those in avocados and nuts are healthy and extra satiating.  Use it to lose it. Aim to incorporate each of the three Ps into every meal and snack. People who eat protein throughout the day are able to keep weight off, according to a study in the American Journal of Clinical Nutrition. In addition to meat, poultry and seafood, good sources are beans, lentils, eggs, tofu, and yogurt. As for fat, keep portion sizes in check by measuring out salad dressing, oil, and nut butters (shoot for one to two tablespoons). Finally, eat veggies or a little fruit at every meal. People who did that consumed 308 fewer calories but didn't feel any hungrier than when they didn't eat more produce.  7. How You Eat Is As Important As What You Eat In order for your brain to register that you're full, you need to focus on what you're eating. Sit down whenever you eat, preferably at a table. Turn off the TV or computer, put down your phone, and look at your food. Smell it. Chew slowly, and don't put another bite on your fork until you swallow. When women ate lunch this attentively, they consumed 30 percent less when snacking later than  those who listened to an audiobook at lunchtime, according to a study in the British Journal of Nutrition. 8. Weighing Yourself Really Works The scale provides the best evidence about whether your efforts are paying off. Seeing the numbers tick up or down or stagnate is motivation to keep going-or to rethink your approach. A 2015 study at [employer] found that daily weigh-ins helped people lose more weight, keep it off, and maintain that loss, even after two years. Use it to lose it. Step on the scale at the same time every day for the best results. If your weight shoots up several pounds from one weigh-in to the next, don't freak out. Eating a lot of salt the night before or having your period is the likely culprit. The number should return to normal in a day or two. It's a steady climb that you need to do something about.   9. Too Much Stress and Too Little Sleep Are Your Enemies When you're tired and frazzled, your body cranks up the production of cortisol, the stress hormone that can cause carb cravings. Not getting enough sleep also boosts your levels of ghrelin, a hormone associated with hunger, while suppressing leptin, a hormone that signals fullness and satiety. People on a diet who slept only five and a half hours a night for two weeks lost 55 percent less fat and were hungrier than those who slept eight and a half hours, according to a study in the [place]. Use it to lose it. Prioritize sleep, aiming for seven hours or more a night, which research shows helps lower stress. And make sure you're getting quality zzz's. If a snoring spouse or a fidgety cat wakes you up frequently throughout the night, you may end up getting the equivalent of just four hours of sleep, according to a study from [facility]. Keep pets out of the bedroom, and use a white-noise app to drown out snoring. 10. You Will Hit a plateau-And You Can Bust Through It As you slim down, your  body releases much less leptin, the fullness hormone.  If you're not strength training, start right now. Building muscle can raise your metabolism to help you overcome a plateau. To keep your body challenged and burning calories, incorporate new moves and more intense intervals into your workouts or add another sweat session to your weekly routine. Alternatively, cut an extra 100 calories or so a day from your diet. Now that you've lost weight, your body simply doesn't need as much fuel.   We want weight loss that will last so you should lose 1-2 pounds a week.  THAT IS IT! Please pick THREE things a month to change. Once it is a habit check off the item. Then pick another three items off the list to become habits.  If you are already doing a habit on the list GREAT!  Cross that item off! o Don't drink your calories. Ie, alcohol, soda, fruit juice, and sweet tea.  o Drink more water. Drink a glass when you feel hungry or before each meal.  o Eat breakfast - Complex carb and protein (likeDannon light and fit yogurt, oatmeal, fruit, eggs, [place] bacon). o Measure your cereal.  Eat no more than one cup a day. (ie [place]) o Eat an apple a day. o Add a vegetable a day. o Try a new vegetable a month. o Use Pam! Stop using oil or butter to cook. o Don't finish your plate or use smaller plates. o Share your dessert. o Eat sugar free Jello for dessert or frozen grapes. o Don't eat 2-3 hours before bed. o Switch to whole wheat bread, pasta, and brown rice. o Make healthier choices when you eat out. No fries! o Pick baked chicken, NOT fried. o Don't forget to SLOW DOWN when you eat. It is not going anywhere.  o Take the stairs. o Park far away in the parking lot o [employer] (or weights) for 10 minutes while watching TV. o Walk at work for 10 minutes during break. o Walk outside 1 time a week with your friend, kids, dog, or significant other. o Start a walking group at [place] the mall as  much as you can tolerate.  o Keep a food diary. o Weigh yourself daily. o Walk for 15 minutes 3 days per week. o Cook at home more often and  eat out less.  If life happens and you go back to old habits, it is okay.  Just start over. You can do it!   If you experience chest pain, get short of breath, or tired during the exercise, please stop immediately and inform your doctor.      Bad carbs also include fruit juice, alcohol, and sweet tea. These are empty calories that do not signal to your brain that you are full.   Please remember the good carbs are still carbs which convert into sugar. So please measure them out no more than 1/2-1 cup of rice, oatmeal, pasta, and beans  Veggies are however free foods! Pile them on.   Not all fruit is created equal. Please see the list below, the fruit at the bottom is higher in sugars than the fruit at the top. Please avoid all dried fruits.

## 2014-09-28 NOTE — Progress Notes (Signed)
Assessment and Plan:  Hypertension: Continue medication, monitor blood pressure at home. Continue DASH diet.  Reminder to go to the ER if any CP, SOB, nausea, dizziness, severe HA, changes vision/speech, left arm numbness and tingling, and jaw pain. Cholesterol: Continue diet and exercise. Check cholesterol.  Pre-diabetes-Continue diet and exercise. Check A1C Vitamin D Def- check level and continue medications.  Obesity with co morbidities- long discussion about weight loss, diet, and exercise, will start the patient on phentermine- hand out given and AE's discussed, will do close follow up.  Hypogonadism- continue replacement therapy, check testosterone levels as needed.    Continue diet and meds as discussed. Further disposition pending results of labs.  HPI 59 y.o. male  presents for 3 month follow up with hypertension, hyperlipidemia, prediabetes and vitamin D.  His blood pressure has been controlled at home, today their BP is BP: 138/88 mmHg  He does workout at gym. He denies chest pain, shortness of breath, dizziness.   He is on cholesterol medication, fenofibrate but his insurance stopped paying for it. His cholesterol is at goal. The cholesterol last visit was:   Lab Results  Component Value Date   CHOL 158 06/28/2014   HDL 36* 06/28/2014   LDLCALC 88 06/28/2014   TRIG 169* 06/28/2014   CHOLHDL 4.4 06/28/2014   He has been working on diet and exercise for prediabetes, and denies paresthesia of the feet, polydipsia, polyuria and visual disturbances. Last A1C in the office was:  Lab Results  Component Value Date   HGBA1C 5.7* 06/28/2014  Patient is on Vitamin D supplement.   Lab Results  Component Value Date   VD25OH 47 06/28/2014  He is has GERD, barrett's and is on nexium, follows with Dr. Deatra Ina.  He has a history of testosterone deficiency and is on testosterone replacement, he is on the gels riight now until he runs out but then will try the shots. He states that the  testosterone helps with his energy, libido, muscle mass. Lab Results  Component Value Date   TESTOSTERONE 782 06/28/2014  BMI is Body mass index is 41.89 kg/(m^2)., he is working on diet and exercise, he has been on phentermine but stopped when he had the EGD and has not restarted. He would like to restart it.  Wt Readings from Last 3 Encounters:  09/28/14 311 lb (141.069 kg)  08/16/14 311 lb (141.069 kg)  08/07/14 311 lb 9.6 oz (141.341 kg)    Current Medications:  Current Outpatient Prescriptions on File Prior to Visit  Medication Sig Dispense Refill  . 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 allopurinol (ZYLOPRIM) 100 MG tablet Take 1 tablet (100 mg total) by mouth daily. Prevent Gout 90 tablet 99  . ALPRAZolam (XANAX) 1 MG tablet Take by mouth 3 (three) times daily as needed. Take 1/2 -1 tab tid    . aspirin 81 MG tablet Take 81 mg by mouth daily.      Marland Kitchen buPROPion (WELLBUTRIN XL) 300 MG 24 hr tablet     . Cyanocobalamin (VITAMIN B 12 PO) Take 1 tablet by mouth daily.      . cyclobenzaprine (FLEXERIL) 10 MG tablet Take 1/2 to 1 tab 3 x day for muscle spasms. 90 tablet 99  . esomeprazole (NEXIUM) 40 MG capsule Take 1 capsule (40 mg total) by mouth 2 (two) times daily. 180 capsule 99  . fenofibrate micronized (LOFIBRA) 134 MG capsule Take 134 mg  by mouth daily before breakfast.    . fish oil-omega-3 fatty acids 1000 MG capsule Take 2 g by mouth daily.      . furosemide (LASIX) 20 MG tablet Take 10 mg by mouth 2 (two) times daily as needed. For water retension    . magnesium oxide (MAG-OX) 400 MG tablet Take 400 mg by mouth 2 (two) times daily.     . montelukast (SINGULAIR) 10 MG tablet Take 1 tablet (10 mg total) by mouth at bedtime. 90 tablet 99  . Multiple Vitamin (MULTIVITAMIN PO) Take 1 tablet by mouth daily.      . phentermine (ADIPEX-P) 37.5 MG tablet TAKE 1 TABLET BY MOUTH EVERY DAY 30 tablet 5  . potassium chloride (K-DUR) 10 MEQ  tablet Take 1 tablet (10 mEq total) by mouth 3 (three) times daily. 270 tablet 3  . sucralfate (CARAFATE) 1 G tablet Take 1 g by mouth 4 (four) times daily.      Marland Kitchen testosterone cypionate (DEPO-TESTOSTERONE) 200 MG/ML injection 1 & 1/2 cc intra muscular every 2 to 3 weeks as directed 10 mL 5  . valsartan-hydrochlorothiazide (DIOVAN-HCT) 160-25 MG per tablet Take 1 tablet by mouth daily. For BP 90 tablet 99  . vitamin C (ASCORBIC ACID) 500 MG tablet Take 500 mg by mouth 3 (three) times daily.    . Vitamin D, Ergocalciferol, (DRISDOL) 50000 UNITS CAPS Take 50,000 Units by mouth daily. Only takes Monday-saturdays     No current facility-administered medications on file prior to visit.   Medical History:  Past Medical History  Diagnosis Date  . Allergy   . GERD (gastroesophageal reflux disease)   . Hyperlipidemia   . Hypertension   . H/O hiatal hernia   . Prediabetes   . Gout   . Vitamin D deficiency   . Barrett's esophagus    Allergies:  Allergies  Allergen Reactions  . Hyzaar [Losartan Potassium-Hctz]     Pt doesn't like this medicine for his Bp- it didn't work- no allergy to this med    Review of Systems:  Review of Systems  Constitutional: Negative.   HENT: Negative.   Eyes: Negative.   Respiratory: Negative.   Cardiovascular: Positive for leg swelling (wearing compression stockings). Negative for chest pain, palpitations, orthopnea, claudication and PND.  Gastrointestinal: Negative.   Genitourinary: Negative.   Musculoskeletal: Negative.   Skin: Negative.   Neurological: Negative.   Endo/Heme/Allergies: Negative.   Psychiatric/Behavioral: Negative.    Family history- Review and unchanged Social history- Review and unchanged Physical Exam: BP 138/88 mmHg  Pulse 96  Temp(Src) 97.7 F (36.5 C)  Resp 16  Ht 6' 0.25" (1.835 m)  Wt 311 lb (141.069 kg)  BMI 41.89 kg/m2 Wt Readings from Last 3 Encounters:  09/28/14 311 lb (141.069 kg)  08/16/14 311 lb (141.069 kg)   08/07/14 311 lb 9.6 oz (141.341 kg)   General Appearance: Well nourished, in no apparent distress. Eyes: PERRLA, EOMs, conjunctiva no swelling or erythema Sinuses: No Frontal/maxillary tenderness ENT/Mouth: Ext aud canals clear, TMs without erythema, bulging. No erythema, swelling, or exudate on post pharynx.  Tonsils not swollen or erythematous. Hearing normal.  Neck: Supple, thyroid normal.  Respiratory: Respiratory effort normal, BS equal bilaterally without rales, rhonchi, wheezing or stridor.  Cardio: RRR with no MRGs. Brisk peripheral pulses with 1-2+  edema.  Abdomen: Soft, + BS, obese  Non tender, no guarding, rebound, hernias, masses. Lymphatics: Non tender without lymphadenopathy.  Musculoskeletal: Full ROM, 5/5 strength, normal gait.  Skin:  Warm, dry without rashes, lesions, ecchymosis.  Neuro: Cranial nerves intact. Normal muscle tone, no cerebellar symptoms. Sensation intact.  Psych: Awake and oriented X 3, normal affect, Insight and Judgment appropriate.    Vicie Mutters, PA-C 5:01 PM [facility]

## 2014-09-29 LAB — CBC WITH DIFFERENTIAL/PLATELET
Basophils Absolute: 0 10*3/uL (ref 0.0–0.1)
Basophils Relative: 0 % (ref 0–1)
EOS PCT: 3 % (ref 0–5)
Eosinophils Absolute: 0.3 10*3/uL (ref 0.0–0.7)
HEMATOCRIT: 43.8 % (ref 39.0–52.0)
Hemoglobin: 15.1 g/dL (ref 13.0–17.0)
Lymphocytes Relative: 39 % (ref 12–46)
Lymphs Abs: 3.3 10*3/uL (ref 0.7–4.0)
MCH: 30.9 pg (ref 26.0–34.0)
MCHC: 34.5 g/dL (ref 30.0–36.0)
MCV: 89.6 fL (ref 78.0–100.0)
MONO ABS: 1.1 10*3/uL — AB (ref 0.1–1.0)
MPV: 10.7 fL (ref 8.6–12.4)
Monocytes Relative: 13 % — ABNORMAL HIGH (ref 3–12)
NEUTROS ABS: 3.8 10*3/uL (ref 1.7–7.7)
Neutrophils Relative %: 45 % (ref 43–77)
PLATELETS: 147 10*3/uL — AB (ref 150–400)
RBC: 4.89 MIL/uL (ref 4.22–5.81)
RDW: 14.4 % (ref 11.5–15.5)
WBC: 8.4 10*3/uL (ref 4.0–10.5)

## 2014-09-29 LAB — HEPATIC FUNCTION PANEL
ALK PHOS: 59 U/L (ref 39–117)
ALT: 33 U/L (ref 0–53)
AST: 24 U/L (ref 0–37)
Albumin: 4.4 g/dL (ref 3.5–5.2)
Bilirubin, Direct: 0.2 mg/dL (ref 0.0–0.3)
Indirect Bilirubin: 0.6 mg/dL (ref 0.2–1.2)
Total Bilirubin: 0.8 mg/dL (ref 0.2–1.2)
Total Protein: 6.9 g/dL (ref 6.0–8.3)

## 2014-09-29 LAB — MAGNESIUM: Magnesium: 1.5 mg/dL (ref 1.5–2.5)

## 2014-09-29 LAB — BASIC METABOLIC PANEL WITH GFR
BUN: 9 mg/dL (ref 6–23)
CHLORIDE: 101 meq/L (ref 96–112)
CO2: 28 mEq/L (ref 19–32)
Calcium: 9.5 mg/dL (ref 8.4–10.5)
Creat: 1.09 mg/dL (ref 0.50–1.35)
GFR, Est African American: 87 mL/min
GFR, Est Non African American: 75 mL/min
Glucose, Bld: 91 mg/dL (ref 70–99)
Potassium: 3.9 mEq/L (ref 3.5–5.3)
Sodium: 138 mEq/L (ref 135–145)

## 2014-09-29 LAB — LIPID PANEL
CHOL/HDL RATIO: 4.2 ratio
CHOLESTEROL: 174 mg/dL (ref 0–200)
HDL: 41 mg/dL (ref >39)
LDL CALC: 112 mg/dL — AB (ref 0–99)
TRIGLYCERIDES: 106 mg/dL (ref <150)
VLDL: 21 mg/dL (ref 0–40)

## 2014-09-29 LAB — HEMOGLOBIN A1C
HEMOGLOBIN A1C: 5.9 % — AB (ref <5.7)
Mean Plasma Glucose: 123 mg/dL — ABNORMAL HIGH (ref <117)

## 2014-09-29 LAB — VITAMIN D 25 HYDROXY (VIT D DEFICIENCY, FRACTURES): VIT D 25 HYDROXY: 51 ng/mL (ref 30–100)

## 2014-09-29 LAB — TSH: TSH: 2.437 u[IU]/mL (ref 0.350–4.500)

## 2014-10-19 ENCOUNTER — Other Ambulatory Visit: Payer: Self-pay | Admitting: Internal Medicine

## 2014-10-26 ENCOUNTER — Other Ambulatory Visit: Payer: Self-pay | Admitting: Physician Assistant

## 2014-10-26 MED ORDER — PHENTERMINE HCL 37.5 MG PO TABS: 37.5000 mg | ORAL_TABLET | Freq: Every day | ORAL | Status: DC

## 2014-10-27 ENCOUNTER — Encounter: Payer: Self-pay | Admitting: Physician Assistant

## 2014-10-30 MED ORDER — TESTOSTERONE CYPIONATE 200 MG/ML IM SOLN: INTRAMUSCULAR | Status: DC

## 2014-11-02 ENCOUNTER — Other Ambulatory Visit: Payer: Self-pay | Admitting: Internal Medicine

## 2014-11-02 ENCOUNTER — Other Ambulatory Visit: Payer: Self-pay

## 2014-11-02 DIAGNOSIS — E349 Endocrine disorder, unspecified: Secondary | ICD-10-CM

## 2014-11-02 MED ORDER — TESTOSTERONE CYPIONATE 200 MG/ML IM SOLN: INTRAMUSCULAR | Status: DC

## 2014-11-02 NOTE — Telephone Encounter (Signed)
[facility] needed clarification on testosterone injection, re-wrote sig as 2 mls IM every 2 weeks as was not accepted as 1-2 mls IM every 2-3 weeks

## 2014-12-29 ENCOUNTER — Encounter: Payer: Self-pay | Admitting: Internal Medicine

## 2014-12-29 ENCOUNTER — Ambulatory Visit: Payer: 59 | Admitting: Internal Medicine

## 2014-12-29 NOTE — Progress Notes (Signed)
Patient ID: Christopher [name], male   DOB: 1955/12/27, 59 y.o.   MRN: [id number]   R  E  S  C  H  E  D  U  L  E  D

## 2015-01-02 ENCOUNTER — Encounter: Payer: Self-pay | Admitting: Physician Assistant

## 2015-01-02 ENCOUNTER — Ambulatory Visit (INDEPENDENT_AMBULATORY_CARE_PROVIDER_SITE_OTHER): Payer: 59 | Admitting: Physician Assistant

## 2015-01-02 VITALS — BP 152/102 | HR 80 | Temp 97.3°F | Resp 16 | Ht 71.75 in | Wt 294.0 lb

## 2015-01-02 DIAGNOSIS — R7309 Other abnormal glucose: Secondary | ICD-10-CM

## 2015-01-02 DIAGNOSIS — Z79899 Other long term (current) drug therapy: Secondary | ICD-10-CM

## 2015-01-02 DIAGNOSIS — R7303 Prediabetes: Secondary | ICD-10-CM

## 2015-01-02 DIAGNOSIS — M791 Myalgia, unspecified site: Secondary | ICD-10-CM

## 2015-01-02 DIAGNOSIS — E785 Hyperlipidemia, unspecified: Secondary | ICD-10-CM

## 2015-01-02 DIAGNOSIS — N183 Chronic kidney disease, stage 3 (moderate): Secondary | ICD-10-CM

## 2015-01-02 DIAGNOSIS — N1831 Chronic kidney disease, stage 3a: Secondary | ICD-10-CM

## 2015-01-02 DIAGNOSIS — E559 Vitamin D deficiency, unspecified: Secondary | ICD-10-CM

## 2015-01-02 DIAGNOSIS — N529 Male erectile dysfunction, unspecified: Secondary | ICD-10-CM

## 2015-01-02 DIAGNOSIS — E291 Testicular hypofunction: Secondary | ICD-10-CM

## 2015-01-02 DIAGNOSIS — E349 Endocrine disorder, unspecified: Secondary | ICD-10-CM

## 2015-01-02 DIAGNOSIS — I10 Essential (primary) hypertension: Secondary | ICD-10-CM

## 2015-01-02 LAB — CBC WITH DIFFERENTIAL/PLATELET
BASOS PCT: 0 % (ref 0–1)
Basophils Absolute: 0 10*3/uL (ref 0.0–0.1)
EOS ABS: 0.2 10*3/uL (ref 0.0–0.7)
Eosinophils Relative: 4 % (ref 0–5)
HEMATOCRIT: 42.3 % (ref 39.0–52.0)
HEMOGLOBIN: 14.5 g/dL (ref 13.0–17.0)
LYMPHS ABS: 2.1 10*3/uL (ref 0.7–4.0)
LYMPHS PCT: 36 % (ref 12–46)
MCH: 30.4 pg (ref 26.0–34.0)
MCHC: 34.3 g/dL (ref 30.0–36.0)
MCV: 88.7 fL (ref 78.0–100.0)
MPV: 10.2 fL (ref 8.6–12.4)
Monocytes Absolute: 0.6 10*3/uL (ref 0.1–1.0)
Monocytes Relative: 11 % (ref 3–12)
NEUTROS PCT: 49 % (ref 43–77)
Neutro Abs: 2.9 10*3/uL (ref 1.7–7.7)
Platelets: 154 10*3/uL (ref 150–400)
RBC: 4.77 MIL/uL (ref 4.22–5.81)
RDW: 14.1 % (ref 11.5–15.5)
WBC: 5.9 10*3/uL (ref 4.0–10.5)

## 2015-01-02 MED ORDER — PHENTERMINE HCL 37.5 MG PO TABS: 37.5000 mg | ORAL_TABLET | Freq: Every day | ORAL | Status: DC

## 2015-01-02 MED ORDER — FUROSEMIDE 20 MG PO TABS: 20.0000 mg | ORAL_TABLET | Freq: Every day | ORAL | Status: DC

## 2015-01-02 MED ORDER — TESTOSTERONE 50 MG/5GM (1%) TD GEL: TRANSDERMAL | Status: DC

## 2015-01-02 NOTE — Progress Notes (Signed)
Assessment and Plan:  1. Hypertension -Continue medication, monitor blood pressure at home, rechecked in the office and it was better. Continue DASH diet.  Reminder to go to the ER if any CP, SOB, nausea, dizziness, severe HA, changes vision/speech, left arm numbness and tingling and jaw pain.  2. Cholesterol -Continue diet and exercise. Check cholesterol.   3. Prediabetes  -Continue diet and exercise. Check A1C  4. Vitamin D Def - check level and continue medications.   5. Obesity with co morbidities-  long discussion about weight loss, diet, and exercise  6. Hypogonadism- Will switch him back to gel's  continue replacement therapy, check testosterone levels as needed.   7. Myalgias-  take NSAIDS PRN, increase fluids, will check CPK, TSH, ESR, magnesium, potassium, denies recent tick exposure    Continue diet and meds as discussed. Further disposition pending results of labs. Over 30 minutes of exam, counseling, chart review, and critical decision making was performed  HPI 59 y.o. male  presents for 3 month follow up on hypertension, cholesterol, prediabetes, and vitamin D deficiency.   His blood pressure has been controlled at home but he stopped his lasix due to leg cramps and his BP is elevated, it did help the leg cramps, his Diovan (116m) does have HCTZ in it, he is on potassium pills 3 times a day, today their BP is BP: (!) 152/102 mmHg . Recheck is 136/70. On xanax for sleep.  He was on the wellbutrin to help quit smoking, he is off of this and has not smoked in 2 years.   He does workout. He denies chest pain, shortness of breath, dizziness.  He is on cholesterol medication, fenofibrate and denies myalgias. His cholesterol is at goal. The cholesterol last visit was:   Lab Results  Component Value Date   CHOL 174 09/28/2014   HDL 41 09/28/2014   LDLCALC 112* 09/28/2014   TRIG 106 09/28/2014   CHOLHDL 4.2 09/28/2014    He has been working on diet and exercise for  prediabetes, and denies paresthesia of the feet, polydipsia, polyuria and visual disturbances. Last A1C in the office was:  Lab Results  Component Value Date   HGBA1C 5.9* 09/28/2014   Patient is on Vitamin D supplement.   Lab Results  Component Value Date   VD25OH 53901/28/2016   He has a history of testosterone deficiency and is on testosterone replacement, he was going to do the shot but would prefer to stick with the gels. He states that the testosterone helps with his energy, libido, muscle mass. Lab Results  Component Value Date   TESTOSTERONE 782 06/28/2014  BMI is Body mass index is 40.17 kg/(m^2)., he is working on diet and exercise, he is on phentermine. Wt Readings from Last 3 Encounters:  01/02/15 294 lb (133.358 kg)  09/28/14 311 lb (141.069 kg)  08/16/14 311 lb (141.069 kg)     Current Medications:  Current Outpatient Prescriptions on File Prior to Visit  Medication Sig Dispense Refill  . 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 allopurinol (ZYLOPRIM) 100 MG tablet Take 1 tablet (100 mg total) by mouth daily. Prevent Gout 90 tablet 99  . ALPRAZolam (XANAX) 1 MG tablet Take by mouth 3 (three) times daily as needed. Take 1/2 -1 tab tid    . aspirin 81 MG tablet Take 81 mg by mouth daily.      . Cyanocobalamin (VITAMIN B 12  PO) Take 1 tablet by mouth daily.      . cyclobenzaprine (FLEXERIL) 10 MG tablet Take 1/2 to 1 tab 3 x day for muscle spasms. 90 tablet 99  . esomeprazole (NEXIUM) 40 MG capsule Take 1 capsule (40 mg total) by mouth 2 (two) times daily. 180 capsule 99  . fenofibrate 160 MG tablet Take 1 tablet (160 mg total) by mouth daily. 90 tablet 1  . fish oil-omega-3 fatty acids 1000 MG capsule Take 2 g by mouth daily.      . furosemide (LASIX) 20 MG tablet Take 10 mg by mouth 2 (two) times daily as needed. For water retension    . magnesium oxide (MAG-OX) 400 MG tablet Take 400 mg by mouth 2 (two) times daily.     .  montelukast (SINGULAIR) 10 MG tablet Take 1 tablet (10 mg total) by mouth at bedtime. 90 tablet 99  . Multiple Vitamin (MULTIVITAMIN PO) Take 1 tablet by mouth daily.      . phentermine (ADIPEX-P) 37.5 MG tablet Take 1 tablet (37.5 mg total) by mouth daily. 30 tablet 2  . potassium chloride (K-DUR) 10 MEQ tablet Take 1 tablet (10 mEq total) by mouth 3 (three) times daily. 270 tablet 3  . sucralfate (CARAFATE) 1 G tablet Take 1 g by mouth 4 (four) times daily.      . valsartan-hydrochlorothiazide (DIOVAN-HCT) 160-25 MG per tablet Take 1 tablet by mouth  daily for blood pressure 90 tablet 1  . vitamin C (ASCORBIC ACID) 500 MG tablet Take 500 mg by mouth 3 (three) times daily.    . Vitamin D, Ergocalciferol, (DRISDOL) 50000 UNITS CAPS Take 50,000 Units by mouth daily. Only takes Monday-saturdays     No current facility-administered medications on file prior to visit.   Medical History:  Past Medical History  Diagnosis Date  . Allergy   . GERD (gastroesophageal reflux disease)   . Hyperlipidemia   . Hypertension   . H/O hiatal hernia   . Prediabetes   . Gout   . Vitamin D deficiency   . Barrett's esophagus    Allergies:  Allergies  Allergen Reactions  . Hyzaar [Losartan Potassium-Hctz]     Pt doesn't like this medicine for his Bp- it didn't work- no allergy to this med     Review of Systems:  Review of Systems  Constitutional: Negative.   HENT: Negative.   Respiratory: Negative.   Cardiovascular: Positive for leg swelling. Negative for chest pain, palpitations, orthopnea, claudication and PND.  Gastrointestinal: Negative.   Genitourinary: Negative.   Musculoskeletal: Positive for myalgias (bilateral legs with rest). Negative for back pain, joint pain, falls and neck pain.  Skin: Negative.   Neurological: Negative.   Psychiatric/Behavioral: Negative.     Family history- Review and unchanged Social history- Review and unchanged Physical Exam: BP 152/102 mmHg  Pulse 80   Temp(Src) 97.3 F (36.3 C)  Resp 16  Ht 5' 11.75" (1.822 m)  Wt 294 lb (133.358 kg)  BMI 40.17 kg/m2 Wt Readings from Last 3 Encounters:  01/02/15 294 lb (133.358 kg)  09/28/14 311 lb (141.069 kg)  08/16/14 311 lb (141.069 kg)   General Appearance: Well nourished, in no apparent distress. Eyes: PERRLA, EOMs, conjunctiva no swelling or erythema Sinuses: No Frontal/maxillary tenderness ENT/Mouth: Ext aud canals clear, TMs without erythema, bulging. No erythema, swelling, or exudate on post pharynx.  Tonsils not swollen or erythematous. Hearing normal.  Neck: Supple, thyroid normal.  Respiratory: Respiratory effort normal, BS equal  bilaterally without rales, rhonchi, wheezing or stridor.  Cardio: RRR with no MRGs. Brisk peripheral pulses with 2+  Edema, in compression stockings.  Abdomen: Soft, + BS, obese  Non tender, no guarding, rebound, hernias, masses. Lymphatics: Non tender without lymphadenopathy.  Musculoskeletal: Full ROM, 5/5 strength, normal gait.  Skin: Warm, dry without rashes, lesions, ecchymosis.  Neuro: Cranial nerves intact. Normal muscle tone, no cerebellar symptoms.  Psych: Awake and oriented X 3, normal affect, Insight and Judgment appropriate.    Vicie Mutters, PA-C 12:29 PM [facility]

## 2015-01-02 NOTE — Patient Instructions (Addendum)
Muscle aches Stop the statin for 1 week and then start back after we get the lab back. We are getting a lab that will look for muscle break down with the statin.  We will check your potassium, magnesium to see if these are low which can cause muscle aches.  Please make sure you are taking 64 oz of water a day as long as you do now have a heart condition.  -Please take Tylenol or Aleve for pain. -You can take tylenol (562m) or tylenol arthritis (6534m. The max you can take of tylenol a day is 300090maily, this is a max of 6 pills a day of the regular tyelnol (500m55mr a max of 4 a day of the tylenol arthritis (650mg6m long as no other medications you are taking contain tylenol.  -Aleve/Naproxen Sodium- can take 1-2 in the morning and 1 at night. Max 3 a day. Take with food to avoid ulcers. Does affect your kidney function so use sparingly.   If you have had tick exposure, we can check for lyme disease or [place] spotted fever.   Muscle Pain Muscle pain (myalgia) may be caused by many things, including:  Overuse or muscle strain, especially if you are not in shape. This is the most common cause of muscle pain.  Injury.  Bruises.  Viruses, such as the flu.  Infectious diseases.  Fibromyalgia, which is a chronic condition that causes muscle tenderness, fatigue, and headache.  Autoimmune diseases, including lupus.  Certain drugs, including ACE inhibitors and statins. Muscle pain may be mild or severe. In most cases, the pain lasts only a short time and goes away without treatment. To diagnose the cause of your muscle pain, your health care provider will take your medical history. This means he or she will ask you when your muscle pain began and what has been happening. If you have not had muscle pain for very long, your health care provider may want to wait before doing much testing. If your muscle pain has lasted a long time, your health care provider may want to run tests right  away. If your health care provider thinks your muscle pain may be caused by illness, you may need to have additional tests to rule out certain conditions.  Treatment for muscle pain depends on the cause. Home care is often enough to relieve muscle pain. Your health care provider may also prescribe anti-inflammatory medicine. HOME CARE INSTRUCTIONS Watch your condition for any changes. The following actions may help to lessen any discomfort you are feeling:  Only take over-the-counter or prescription medicines as directed by your health care provider.  Apply ice to the sore muscle:  Put ice in a plastic bag.  Place a towel between your skin and the bag.  Leave the ice on for 15-20 minutes, 3-4 times a day.  You may alternate applying hot and cold packs to the muscle as directed by your health care provider.  If overuse is causing your muscle pain, slow down your activities until the pain goes away.  Remember that it is normal to feel some muscle pain after starting a workout program. Muscles that have not been used often will be sore at first.  Do regular, gentle exercises if you are not usually active.  Warm up before exercising to lower your risk of muscle pain.  Do not continue working out if the pain is very bad. Bad pain could mean you have injured a muscle. SEEK [place]  IF:  Your muscle pain gets worse, and medicines do not help.  You have muscle pain that lasts longer than 3 days.  You have a rash or fever along with muscle pain.  You have muscle pain after a tick bite.  You have muscle pain while working out, even though you are in good physical condition.  You have redness, soreness, or swelling along with muscle pain.  You have muscle pain after starting a new medicine or changing the dose of a medicine. SEEK IMMEDIATE MEDICAL CARE IF:  You have trouble breathing.  You have trouble swallowing.  You have muscle pain along with a stiff neck, fever, and  vomiting.  You have severe muscle weakness or cannot move part of your body. MAKE SURE YOU:   Understand these instructions.  Will watch your condition.  Will get help right away if you are not doing well or get worse. Document Released: 07/10/2006 Document Revised: 08/23/2013 Document Reviewed: 06/14/2013 [facility] Patient Information 2015 [place], [place]. This information is not intended to replace advice given to you by your health care provider. Make sure you discuss any questions you have with your health care provider.   Before you even begin to attack a weight-loss plan, it pays to remember this: You are not fat. You have fat. Losing weight isn't about blame or shame; it's simply another achievement to accomplish. Dieting is like any other skill-you have to buckle down and work at it. As long as you act in a smart, reasonable way, you'll ultimately get where you want to be. Here are some weight loss pearls for you.  1. It's Not a Diet. It's a Lifestyle Thinking of a diet as something you're on and suffering through only for the short term doesn't work. To shed weight and keep it off, you need to make permanent changes to the way you eat. It's OK to indulge occasionally, of course, but if you cut calories temporarily and then revert to your old way of eating, you'll gain back the weight quicker than you can say yo-yo. Use it to lose it. Research shows that one of the best predictors of long-term weight loss is how many pounds you drop in the first month. For that reason, nutritionists often suggest being stricter for the first two weeks of your new eating strategy to build momentum. Cut out added sugar and alcohol and avoid unrefined carbs. After that, figure out how you can reincorporate them in a way that's healthy and maintainable.  2. There's a Right Way to Exercise Working out burns calories and fat and boosts your metabolism by building muscle. But those trying to lose weight are  notorious for overestimating the number of calories they burn and underestimating the amount they take in. Unfortunately, your system is biologically programmed to hold on to extra pounds and that means when you start exercising, your body senses the deficit and ramps up its hunger signals. If you're not diligent, you'll eat everything you burn and then some. Use it to lose it. Cardio gets all the exercise glory, but strength and interval training are the real heroes. They help you build lean muscle, which in turn increases your metabolism and calorie-burning ability 3. Don't Overreact to Mild Hunger Some people have a hard time losing weight because of hunger anxiety. To them, being hungry is bad-something to be avoided at all costs-so they carry snacks with them and eat when they don't need to. Others eat because they're stressed out or  bored. While you never want to get to the point of being ravenous (that's when bingeing is likely to happen), a hunger pang, a craving, or the fact that it's 3:00 p.m. should not send you racing for the vending machine or obsessing about the energy bar in your purse. Ideally, you should put off eating until your stomach is growling and it's difficult to concentrate.  Use it to lose it. When you feel the urge to eat, use the HALT method. Ask yourself, Am I really hungry? Or am I angry or anxious, lonely or bored, or tired? If you're still not certain, try the apple test. If you're truly hungry, an apple should seem delicious; if it doesn't, something else is going on. Or you can try drinking water and making yourself busy, if you are still hungry try a healthy snack.  4. Not All Calories Are Created Equal The mechanics of weight loss are pretty simple: Take in fewer calories than you use for energy. But the kind of food you eat makes all the difference. Processed food that's high in saturated fat and refined starch or sugar can cause inflammation that disrupts the hormone  signals that tell your brain you're full. The result: You eat a lot more.  Use it to lose it. Clean up your diet. Swap in whole, unprocessed foods, including vegetables, lean protein, and healthy fats that will fill you up and give you the biggest nutritional bang for your calorie buck. In a few weeks, as your brain starts receiving regular hunger and fullness signals once again, you'll notice that you feel less hungry overall and naturally start cutting back on the amount you eat.  5. Protein, Produce, and Plant-Based Fats Are Your Weight-Loss Trinity Here's why eating the three Ps regularly will help you drop pounds. Protein fills you up. You need it to build lean muscle, which keeps your metabolism humming so that you can torch more fat. People in a weight-loss program who ate double the recommended daily allowance for protein (about 110 grams for a 150-pound woman) lost 70 percent of their weight from fat, while people who ate the RDA lost only about 40 percent, one study found. Produce is packed with filling fiber. "It's very difficult to consume too many calories if you're eating a lot of vegetables. Example: Three cups of broccoli is a lot of food, yet only 93 calories. (Fruit is another story. It can be easy to overeat and can contain a lot of calories from sugar, so be sure to monitor your intake.) Plant-based fats like olive oil and those in avocados and nuts are healthy and extra satiating.  Use it to lose it. Aim to incorporate each of the three Ps into every meal and snack. People who eat protein throughout the day are able to keep weight off, according to a study in the [place] of Clinical Nutrition. In addition to meat, poultry and seafood, good sources are beans, lentils, eggs, tofu, and yogurt. As for fat, keep portion sizes in check by measuring out salad dressing, oil, and nut butters (shoot for one to two tablespoons). Finally, eat veggies or a little fruit at every meal. People  who did that consumed 308 fewer calories but didn't feel any hungrier than when they didn't eat more produce.  7. How You Eat Is As Important As What You Eat In order for your brain to register that you're full, you need to focus on what you're eating. Sit down whenever you  eat, preferably at a table. Turn off the TV or computer, put down your phone, and look at your food. Smell it. Chew slowly, and don't put another bite on your fork until you swallow. When women ate lunch this attentively, they consumed 30 percent less when snacking later than those who listened to an audiobook at lunchtime, according to a study in the [place] of Nutrition. 8. Weighing Yourself Really Works The scale provides the best evidence about whether your efforts are paying off. Seeing the numbers tick up or down or stagnate is motivation to keep going-or to rethink your approach. A 2015 study at [facility] found that daily weigh-ins helped people lose more weight, keep it off, and maintain that loss, even after two years. Use it to lose it. Step on the scale at the same time every day for the best results. If your weight shoots up several pounds from one weigh-in to the next, don't freak out. Eating a lot of salt the night before or having your period is the likely culprit. The number should return to normal in a day or two. It's a steady climb that you need to do something about. 9. Too Much Stress and Too Little Sleep Are Your Enemies When you're tired and frazzled, your body cranks up the production of cortisol, the stress hormone that can cause carb cravings. Not getting enough sleep also boosts your levels of ghrelin, a hormone associated with hunger, while suppressing leptin, a hormone that signals fullness and satiety. People on a diet who slept only five and a half hours a night for two weeks lost 55 percent less fat and were hungrier than those who slept eight and a half hours, according to a study in the  [place]. Use it to lose it. Prioritize sleep, aiming for seven hours or more a night, which research shows helps lower stress. And make sure you're getting quality zzz's. If a snoring spouse or a fidgety cat wakes you up frequently throughout the night, you may end up getting the equivalent of just four hours of sleep, according to a study from [facility]. Keep pets out of the bedroom, and use a white-noise app to drown out snoring. 10. You Will Hit a plateau-And You Can Bust Through It As you slim down, your body releases much less leptin, the fullness hormone.  If you're not strength training, start right now. Building muscle can raise your metabolism to help you overcome a plateau. To keep your body challenged and burning calories, incorporate new moves and more intense intervals into your workouts or add another sweat session to your weekly routine. Alternatively, cut an extra 100 calories or so a day from your diet. Now that you've lost weight, your body simply doesn't need as much fuel.   .Ways to cut 100 calories  1. Eat your eggs with hot sauce OR salsa instead of cheese.  Eggs are great for breakfast, but many people consider eggs and cheese to be BFFs. Instead of cheese-1 oz. of cheddar has 114 calories-top your eggs with hot sauce, which contains no calories and helps with satiety and metabolism. Salsa is also a great option!!  2. Top your toast, waffles or pancakes with mashed berries instead of jelly or syrup. Half a cup of berries-fresh, frozen or thawed-has about 40 calories, compared with 2 tbsp. of maple syrup or jelly, which both have about 100 calories. The berries will also give you a good punch of  fiber, which helps keep you full and satisfied and won't spike blood sugar quickly like the jelly or syrup. 3. Swap the non-fat latte for black coffee with a splash of half-and-half. Contrary to its name, that non-fat latte has 130 calories and a  startling 19g of carbohydrates per 16 oz. serving. Replacing that 'light' drinkable dessert with a black coffee with a splash of half-and-half saves you more than 100 calories per 16 oz. serving. 4. Sprinkle salads with freeze-dried raspberries instead of dried cranberries. If you want a sweet addition to your nutritious salad, stay away from dried cranberries. They have a whopping 130 calories per  cup and 30g carbohydrates. Instead, sprinkle freeze-dried raspberries guilt-free and save more than 100 calories per  cup serving, adding 3g of belly-filling fiber. 5. Go for mustard in place of mayo on your sandwich. Mustard can add really nice flavor to any sandwich, and there are tons of varieties, from spicy to honey. A serving of mayo is 95 calories, versus 10 calories in a serving of mustard. 6. Choose a DIY salad dressing instead of the store-bought kind. Mix Dijon or whole grain mustard with low-fat Kefir or red wine vinegar and garlic. 7. Use hummus as a spread instead of a dip. Use hummus as a spread on a high-fiber cracker or tortilla with a sandwich and save on calories without sacrificing taste. 8. Pick just one salad "accessory." Salad isn't automatically a calorie winner. It's easy to over-accessorize with toppings. Instead of topping your salad with nuts, avocado and cranberries (all three will clock in at 313 calories), just pick one. The next day, choose a different accessory, which will also keep your salad interesting. You don't wear all your jewelry every day, right? 9. Ditch the white pasta in favor of spaghetti squash. One cup of cooked spaghetti squash has about 40 calories, compared with traditional spaghetti, which comes with more than 200. Spaghetti squash is also nutrient-dense. It's a good source of fiber and Vitamins A and C, and it can be eaten just like you would eat pasta-with a great tomato sauce and [place] meatballs or with pesto, tofu and spinach, for example. 10. Dress  up your chili, soups and stews with non-fat [place] yogurt instead of sour cream. Just a 'dollop' of sour cream can set you back 115 calories and a whopping 12g of fat-seven of which are of the artery-clogging variety. Added bonus: [place] yogurt is packed with muscle-building protein, calcium and B Vitamins. 11. Mash cauliflower instead of mashed potatoes. One cup of traditional mashed potatoes-in all their creamy goodness-has more than 200 calories, compared to mashed cauliflower, which you can typically eat for less than 100 calories per 1 cup serving. Cauliflower is a great source of the antioxidant indole-3-carbinol (I3C), which may help reduce the risk of some cancers, like breast cancer. 12. Ditch the ice cream sundae in favor of a [place] yogurt parfait. Instead of a cup of ice cream or fro-yo for dessert, try 1 cup of nonfat Greek yogurt topped with fresh berries and a sprinkle of cacao nibs. Both toppings are packed with antioxidants, which can help reduce cellular inflammation and oxidative damage. And the comparison is a no-brainer: One cup of ice cream has about 275 calories; one cup of frozen yogurt has about 230; and a cup of Greek yogurt has just 130, plus twice the protein, so you're less likely to return to the freezer for a second helping. 13. Put olive oil in a spray container instead  of using it directly from the bottle. Each tablespoon of olive oil is 120 calories and 15g of fat. Use a mister instead of pouring it straight into the pan or onto a salad. This allows for portion control and will save you more than 100 calories. 14. When baking, substitute canned pumpkin for butter or oil. Canned pumpkin-not pumpkin pie mix-is loaded with Vitamin A, which is important for skin and eye health, as well as immunity. And the comparisons are pretty crazy:  cup of canned pumpkin has about 40 calories, compared to butter or oil, which has more than 800 calories. Yes, 800 calories. Applesauce and  mashed banana can also serve as good substitutions for butter or oil, usually in a 1:1 ratio. 15. Top casseroles with high-fiber cereal instead of breadcrumbs. Breadcrumbs are typically made with white bread, while breakfast cereals contain 5-9g of fiber per serving. Not only will you save more than 150 calories per  cup serving, the swap will also keep you more full and you'll get a metabolism boost from the added fiber. 16. Snack on pistachios instead of macadamia nuts. Believe it or not, you get the same amount of calories from 35 pistachios (100 calories) as you would from only five macadamia nuts. 17. Chow down on kale chips rather than potato chips. This is my favorite 'don't knock it 'till you try it' swap. Kale chips are so easy to make at home, and you can spice them up with a little grated parmesan or chili powder. Plus, they're a mere fraction of the calories of potato chips, but with the same crunch factor we crave so often. 18. Add seltzer and some fruit slices to your cocktail instead of soda or fruit juice. One cup of soda or fruit juice can pack on as much as 140 calories. Instead, use seltzer and fruit slices. The fruit provides valuable phytochemicals, such as flavonoids and anthocyanins, which help to combat cancer and stave off the aging process.  Monitor your blood pressure at home, if it is above 140/90 consistently call the office so we can adjust your medications. Go to the ER if any CP, SOB, nausea, dizziness, severe HA, changes vision/speech  DASH Eating Plan DASH stands for "Dietary Approaches to Stop Hypertension." The DASH eating plan is a healthy eating plan that has been shown to reduce high blood pressure (hypertension). Additional health benefits may include reducing the risk of type 2 diabetes mellitus, heart disease, and stroke. The DASH eating plan may also help with weight loss. WHAT DO I NEED TO KNOW ABOUT THE DASH EATING PLAN? For the DASH eating plan, you will  follow these general guidelines:  Choose foods with a percent daily value for sodium of less than 5% (as listed on the food label).  Use salt-free seasonings or herbs instead of table salt or sea salt.  Check with your health care provider or pharmacist before using salt substitutes.  Eat lower-sodium products, often labeled as "lower sodium" or "no salt added."  Eat fresh foods.  Eat more vegetables, fruits, and low-fat dairy products.  Choose whole grains. Look for the word "whole" as the first word in the ingredient list.  Choose fish and skinless chicken or [place] more often than red meat. Limit fish, poultry, and meat to 6 oz (170 g) each day.  Limit sweets, desserts, sugars, and sugary drinks.  Choose heart-healthy fats.  Limit cheese to 1 oz (28 g) per day.  Eat more home-cooked food and less restaurant,  buffet, and fast food.  Limit fried foods.  Cook foods using methods other than frying.  Limit canned vegetables. If you do use them, rinse them well to decrease the sodium.  When eating at a restaurant, ask that your food be prepared with less salt, or no salt if possible. WHAT FOODS CAN I EAT? Seek help from a dietitian for individual calorie needs. Grains Whole grain or whole wheat bread. Brown rice. Whole grain or whole wheat pasta. Quinoa, bulgur, and whole grain cereals. Low-sodium cereals. Corn or whole wheat flour tortillas. Whole grain cornbread. Whole grain crackers. Low-sodium crackers. Vegetables Fresh or frozen vegetables (raw, steamed, roasted, or grilled). Low-sodium or reduced-sodium tomato and vegetable juices. Low-sodium or reduced-sodium tomato sauce and paste. Low-sodium or reduced-sodium canned vegetables.  Fruits All fresh, canned (in natural juice), or frozen fruits. Meat and Other Protein Products Ground beef (85% or leaner), grass-fed beef, or beef trimmed of fat. Skinless chicken or [place]. Ground chicken or [place]. Pork trimmed of fat. All  fish and seafood. Eggs. Dried beans, peas, or lentils. Unsalted nuts and seeds. Unsalted canned beans. Dairy Low-fat dairy products, such as skim or 1% milk, 2% or reduced-fat cheeses, low-fat ricotta or cottage cheese, or plain low-fat yogurt. Low-sodium or reduced-sodium cheeses. Fats and Oils Tub margarines without trans fats. Light or reduced-fat mayonnaise and salad dressings (reduced sodium). Avocado. Safflower, olive, or canola oils. Natural peanut or almond butter. Other Unsalted popcorn and pretzels. The items listed above may not be a complete list of recommended foods or beverages. Contact your dietitian for more options. WHAT FOODS ARE NOT RECOMMENDED? Grains White bread. White pasta. White rice. Refined cornbread. Bagels and croissants. Crackers that contain trans fat. Vegetables Creamed or fried vegetables. Vegetables in a cheese sauce. Regular canned vegetables. Regular canned tomato sauce and paste. Regular tomato and vegetable juices. Fruits Dried fruits. Canned fruit in light or heavy syrup. Fruit juice. Meat and Other Protein Products Fatty cuts of meat. Ribs, chicken wings, bacon, sausage, bologna, salami, chitterlings, fatback, hot dogs, bratwurst, and packaged luncheon meats. Salted nuts and seeds. Canned beans with salt. Dairy Whole or 2% milk, cream, half-and-half, and cream cheese. Whole-fat or sweetened yogurt. Full-fat cheeses or blue cheese. Nondairy creamers and whipped toppings. Processed cheese, cheese spreads, or cheese curds. Condiments Onion and garlic salt, seasoned salt, table salt, and sea salt. Canned and packaged gravies. Worcestershire sauce. Tartar sauce. Barbecue sauce. Teriyaki sauce. Soy sauce, including reduced sodium. Steak sauce. Fish sauce. Oyster sauce. Cocktail sauce. Horseradish. Ketchup and mustard. Meat flavorings and tenderizers. Bouillon cubes. Hot sauce. Tabasco sauce. Marinades. Taco seasonings. Relishes. Fats and Oils Butter, stick  margarine, lard, shortening, ghee, and bacon fat. Coconut, palm kernel, or palm oils. Regular salad dressings. Other Pickles and olives. Salted popcorn and pretzels. The items listed above may not be a complete list of foods and beverages to avoid. Contact your dietitian for more information. WHERE CAN I FIND MORE INFORMATION? National Heart, Lung, and Blood Institute: travelstabloid.com Document Released: 08/07/2011 Document Revised: 01/02/2014 Document Reviewed: 06/22/2013 [facility] Patient Information 2015 [place], [place]. This information is not intended to replace advice given to you by your health care provider. Make sure you discuss any questions you have with your health care provider.  Lateral Epicondylitis (Tennis Elbow) with Rehab Lateral epicondylitis involves inflammation and pain around the outer portion of the elbow. The pain is caused by inflammation of the tendons in the forearm that bring back (extend) the wrist. Lateral epicondylitis is also called  tennis elbow, because it is very common in tennis players. However, it may occur in any individual who extends the wrist repetitively. If lateral epicondylitis is left untreated, it may become a chronic problem. SYMPTOMS   Pain, tenderness, and inflammation on the outer (lateral) side of the elbow.  Pain or weakness with gripping activities.  Pain that increases with wrist-twisting motions (playing tennis, using a screwdriver, opening a door or a jar).  Pain with lifting objects, including a coffee cup. CAUSES  Lateral epicondylitis is caused by inflammation of the tendons that extend the wrist. Causes of injury may include:  Repetitive stress and strain on the muscles and tendons that extend the wrist.  Sudden change in activity level or intensity.  Incorrect grip in racquet sports.  Incorrect grip size of racquet (often too large).  Incorrect hitting position or technique (usually  backhand, leading with the elbow).  Using a racket that is too heavy. RISK INCREASES WITH:  Sports or occupations that require repetitive and/or strenuous forearm and wrist movements (tennis, squash, racquetball, carpentry).  Poor wrist and forearm strength and flexibility.  Failure to warm up properly before activity.  Resuming activity before healing, rehabilitation, and conditioning are complete. PREVENTION   Warm up and stretch properly before activity.  Maintain physical fitness:  Strength, flexibility, and endurance.  Cardiovascular fitness.  Wear and use properly fitted equipment.  Learn and use proper technique and have a coach correct improper technique.  Wear a tennis elbow (counterforce) brace. PROGNOSIS  The course of this condition depends on the degree of the injury. If treated properly, acute cases (symptoms lasting less than 4 weeks) are often resolved in 2 to 6 weeks. Chronic (longer lasting cases) often resolve in 3 to 6 months but may require physical therapy. RELATED COMPLICATIONS   Frequently recurring symptoms, resulting in a chronic problem. Properly treating the problem the first time decreases frequency of recurrence.  Chronic inflammation, scarring tendon degeneration, and partial tendon tear, requiring surgery.  Delayed healing or resolution of symptoms. TREATMENT  Treatment first involves the use of ice and medicine to reduce pain and inflammation. Strengthening and stretching exercises may help reduce discomfort if performed regularly. These exercises may be performed at home if the condition is an acute injury. Chronic cases may require a referral to a physical therapist for evaluation and treatment. Your caregiver may advise a corticosteroid injection to help reduce inflammation. Rarely, surgery is needed. MEDICATION  If pain medicine is needed, nonsteroidal anti-inflammatory medicines (aspirin and ibuprofen), or other minor pain relievers  (acetaminophen), are often advised.  Do not take pain medicine for 7 days before surgery.  Prescription pain relievers may be given, if your caregiver thinks they are needed. Use only as directed and only as much as you need.  Corticosteroid injections may be recommended. These injections should be reserved only for the most severe cases, because they can only be given a certain number of times. HEAT AND COLD  Cold treatment (icing) should be applied for 10 to 15 minutes every 2 to 3 hours for inflammation and pain, and immediately after activity that aggravates your symptoms. Use ice packs or an ice massage.  Heat treatment may be used before performing stretching and strengthening activities prescribed by your caregiver, physical therapist, or athletic trainer. Use a heat pack or a warm water soak. SEEK MEDICAL CARE IF: Symptoms get worse or do not improve in 2 weeks, despite treatment. EXERCISES  RANGE OF MOTION (ROM) AND STRETCHING EXERCISES - Epicondylitis,  Lateral (Tennis Elbow) These exercises may help you when beginning to rehabilitate your injury. Your symptoms may go away with or without further involvement from your physician, physical therapist, or athletic trainer. While completing these exercises, remember:   Restoring tissue flexibility helps normal motion to return to the joints. This allows healthier, less painful movement and activity.  An effective stretch should be held for at least 30 seconds.  A stretch should never be painful. You should only feel a gentle lengthening or release in the stretched tissue. RANGE OF MOTION - Wrist Flexion, Active-Assisted  Extend your right / left elbow with your fingers pointing down.*  Gently pull the back of your hand towards you, until you feel a gentle stretch on the top of your forearm.  Hold this position for __________ seconds. Repeat __________ times. Complete this exercise __________ times per day.  *If directed by your  physician, physical therapist or athletic trainer, complete this stretch with your elbow bent, rather than extended. RANGE OF MOTION - Wrist Extension, Active-Assisted  Extend your right / left elbow and turn your palm upwards.*  Gently pull your palm and fingertips back, so your wrist extends and your fingers point more toward the ground.  You should feel a gentle stretch on the inside of your forearm.  Hold this position for __________ seconds. Repeat __________ times. Complete this exercise __________ times per day. *If directed by your physician, physical therapist or athletic trainer, complete this stretch with your elbow bent, rather than extended. STRETCH - Wrist Flexion  Place the back of your right / left hand on a tabletop, leaving your elbow slightly bent. Your fingers should point away from your body.  Gently press the back of your hand down onto the table by straightening your elbow. You should feel a stretch on the top of your forearm.  Hold this position for __________ seconds. Repeat __________ times. Complete this stretch __________ times per day.  STRETCH - Wrist Extension   Place your right / left fingertips on a tabletop, leaving your elbow slightly bent. Your fingers should point backwards.  Gently press your fingers and palm down onto the table by straightening your elbow. You should feel a stretch on the inside of your forearm.  Hold this position for __________ seconds. Repeat __________ times. Complete this stretch __________ times per day.  STRENGTHENING EXERCISES - Epicondylitis, Lateral (Tennis Elbow) These exercises may help you when beginning to rehabilitate your injury. They may resolve your symptoms with or without further involvement from your physician, physical therapist, or athletic trainer. While completing these exercises, remember:   Muscles can gain both the endurance and the strength needed for everyday activities through controlled  exercises.  Complete these exercises as instructed by your physician, physical therapist or athletic trainer. Increase the resistance and repetitions only as guided.  You may experience muscle soreness or fatigue, but the pain or discomfort you are trying to eliminate should never worsen during these exercises. If this pain does get worse, stop and make sure you are following the directions exactly. If the pain is still present after adjustments, discontinue the exercise until you can discuss the trouble with your caregiver. STRENGTH - Wrist Flexors  Sit with your right / left forearm palm-up and fully supported on a table or countertop. Your elbow should be resting below the height of your shoulder. Allow your wrist to extend over the edge of the surface.  Loosely holding a __________ weight, or a piece of rubber exercise  band or tubing, slowly curl your hand up toward your forearm.  Hold this position for __________ seconds. Slowly lower the wrist back to the starting position in a controlled manner. Repeat __________ times. Complete this exercise __________ times per day.  STRENGTH - Wrist Extensors  Sit with your right / left forearm palm-down and fully supported on a table or countertop. Your elbow should be resting below the height of your shoulder. Allow your wrist to extend over the edge of the surface.  Loosely holding a __________ weight, or a piece of rubber exercise band or tubing, slowly curl your hand up toward your forearm.  Hold this position for __________ seconds. Slowly lower the wrist back to the starting position in a controlled manner. Repeat __________ times. Complete this exercise __________ times per day.  STRENGTH - Ulnar Deviators  Stand with a ____________________ weight in your right / left hand, or sit while holding a rubber exercise band or tubing, with your healthy arm supported on a table or countertop.  Move your wrist, so that your pinkie travels toward  your forearm and your thumb moves away from your forearm.  Hold this position for __________ seconds and then slowly lower the wrist back to the starting position. Repeat __________ times. Complete this exercise __________ times per day STRENGTH - Radial Deviators  Stand with a ____________________ weight in your right / left hand, or sit while holding a rubber exercise band or tubing, with your injured arm supported on a table or countertop.  Raise your hand upward in front of you or pull up on the rubber tubing.  Hold this position for __________ seconds and then slowly lower the wrist back to the starting position. Repeat __________ times. Complete this exercise __________ times per day. STRENGTH - Forearm Supinators   Sit with your right / left forearm supported on a table, keeping your elbow below shoulder height. Rest your hand over the edge, palm down.  Gently grip a hammer or a soup ladle.  Without moving your elbow, slowly turn your palm and hand upward to a "thumbs-up" position.  Hold this position for __________ seconds. Slowly return to the starting position. Repeat __________ times. Complete this exercise __________ times per day.  STRENGTH - Forearm Pronators   Sit with your right / left forearm supported on a table, keeping your elbow below shoulder height. Rest your hand over the edge, palm up.  Gently grip a hammer or a soup ladle.  Without moving your elbow, slowly turn your palm and hand upward to a "thumbs-up" position.  Hold this position for __________ seconds. Slowly return to the starting position. Repeat __________ times. Complete this exercise __________ times per day.  STRENGTH - Grip  Grasp a tennis ball, a dense sponge, or a large, rolled sock in your hand.  Squeeze as hard as you can, without increasing any pain.  Hold this position for __________ seconds. Release your grip slowly. Repeat __________ times. Complete this exercise __________ times  per day.  STRENGTH - Elbow Extensors, Isometric  Stand or sit upright, on a firm surface. Place your right / left arm so that your palm faces your stomach, and it is at the height of your waist.  Place your opposite hand on the underside of your forearm. Gently push up as your right / left arm resists. Push as hard as you can with both arms, without causing any pain or movement at your right / left elbow. Hold this stationary position for  __________ seconds. Gradually release the tension in both arms. Allow your muscles to relax completely before repeating. Document Released: 08/18/2005 Document Revised: 01/02/2014 Document Reviewed: 11/30/2008 [facility] Patient Information 2015 [place], [place]. This information is not intended to replace advice given to you by your health care provider. Make sure you discuss any questions you have with your health care provider.

## 2015-01-03 LAB — BASIC METABOLIC PANEL WITH GFR
BUN: 11 mg/dL (ref 6–23)
CO2: 28 mEq/L (ref 19–32)
CREATININE: 1.05 mg/dL (ref 0.50–1.35)
Calcium: 9 mg/dL (ref 8.4–10.5)
Chloride: 102 mEq/L (ref 96–112)
GFR, Est Non African American: 78 mL/min
GLUCOSE: 123 mg/dL — AB (ref 70–99)
Potassium: 4.1 mEq/L (ref 3.5–5.3)
SODIUM: 138 meq/L (ref 135–145)

## 2015-01-03 LAB — HEPATIC FUNCTION PANEL
ALT: 25 U/L (ref 0–53)
AST: 23 U/L (ref 0–37)
Albumin: 4.2 g/dL (ref 3.5–5.2)
Alkaline Phosphatase: 51 U/L (ref 39–117)
Bilirubin, Direct: 0.2 mg/dL (ref 0.0–0.3)
Indirect Bilirubin: 0.5 mg/dL (ref 0.2–1.2)
TOTAL PROTEIN: 6.9 g/dL (ref 6.0–8.3)
Total Bilirubin: 0.7 mg/dL (ref 0.2–1.2)

## 2015-01-03 LAB — LIPID PANEL
Cholesterol: 164 mg/dL (ref 0–200)
HDL: 35 mg/dL — ABNORMAL LOW (ref >=40)
LDL CALC: 100 mg/dL — AB (ref 0–99)
Total CHOL/HDL Ratio: 4.7 Ratio
Triglycerides: 146 mg/dL (ref <150)
VLDL: 29 mg/dL (ref 0–40)

## 2015-01-03 LAB — MAGNESIUM: Magnesium: 1.7 mg/dL (ref 1.5–2.5)

## 2015-01-03 LAB — CK: Total CK: 242 U/L — ABNORMAL HIGH (ref 7–232)

## 2015-01-03 LAB — HEMOGLOBIN A1C
Hgb A1c MFr Bld: 5.8 % — ABNORMAL HIGH (ref <5.7)
MEAN PLASMA GLUCOSE: 120 mg/dL — AB (ref <117)

## 2015-01-03 LAB — INSULIN, FASTING: Insulin fasting, serum: 117.7 u[IU]/mL — ABNORMAL HIGH (ref 2.0–19.6)

## 2015-01-03 LAB — TSH: TSH: 1.538 u[IU]/mL (ref 0.350–4.500)

## 2015-01-03 LAB — TESTOSTERONE: TESTOSTERONE: 175 ng/dL — AB (ref 300–890)

## 2015-01-03 LAB — VITAMIN D 25 HYDROXY (VIT D DEFICIENCY, FRACTURES): VIT D 25 HYDROXY: 41 ng/mL (ref 30–100)

## 2015-01-04 ENCOUNTER — Other Ambulatory Visit: Payer: Self-pay

## 2015-01-04 MED ORDER — FUROSEMIDE 20 MG PO TABS: 20.0000 mg | ORAL_TABLET | Freq: Every day | ORAL | Status: DC

## 2015-01-05 ENCOUNTER — Other Ambulatory Visit: Payer: Self-pay

## 2015-01-05 DIAGNOSIS — E291 Testicular hypofunction: Secondary | ICD-10-CM

## 2015-01-05 DIAGNOSIS — N529 Male erectile dysfunction, unspecified: Secondary | ICD-10-CM

## 2015-01-05 MED ORDER — TESTOSTERONE 50 MG/5GM (1%) TD GEL: TRANSDERMAL | Status: DC

## 2015-01-05 NOTE — Telephone Encounter (Signed)
Had to re-order Rx in MD name, Dr Melford Aase

## 2015-02-06 ENCOUNTER — Other Ambulatory Visit: Payer: Self-pay | Admitting: *Deleted

## 2015-02-06 ENCOUNTER — Other Ambulatory Visit: Payer: Self-pay | Admitting: Internal Medicine

## 2015-02-06 ENCOUNTER — Other Ambulatory Visit: Payer: Self-pay | Admitting: Physician Assistant

## 2015-02-06 MED ORDER — ALPRAZOLAM 1 MG PO TABS: ORAL_TABLET | ORAL | Status: DC

## 2015-02-06 MED ORDER — TESTOSTERONE 50 MG/5GM (1%) TD GEL: 5.0000 g | Freq: Every day | TRANSDERMAL | Status: DC

## 2015-03-07 ENCOUNTER — Telehealth: Payer: Self-pay | Admitting: Gastroenterology

## 2015-03-07 NOTE — Telephone Encounter (Signed)
Spoke with the patient. He reports seeing blood with his bowel movements. It has happened twice in 1 month. He denies any pain. He does want to be evaluated for this. Appointment scheduled.

## 2015-03-08 ENCOUNTER — Encounter: Payer: Self-pay | Admitting: Nurse Practitioner

## 2015-03-08 ENCOUNTER — Ambulatory Visit (INDEPENDENT_AMBULATORY_CARE_PROVIDER_SITE_OTHER): Payer: 59 | Admitting: Nurse Practitioner

## 2015-03-08 VITALS — BP 124/90 | HR 80 | Ht 71.25 in | Wt 322.0 lb

## 2015-03-08 DIAGNOSIS — K648 Other hemorrhoids: Secondary | ICD-10-CM

## 2015-03-08 DIAGNOSIS — K625 Hemorrhage of anus and rectum: Secondary | ICD-10-CM | POA: Diagnosis not present

## 2015-03-08 MED ORDER — HYDROCORTISONE 2.5 % RE CREA: 1.0000 "application " | TOPICAL_CREAM | Freq: Every day | RECTAL | Status: DC

## 2015-03-08 NOTE — Patient Instructions (Signed)
We have sent medications to your pharmacy for you to pick up at your convenience.  Call our office in 10 days with condition update.

## 2015-03-08 NOTE — Progress Notes (Signed)
     History of Present Illness:    Patient is a 59 year old male known to Dr. Deatra Ina. He has a history of Barrett's esophagus, last surveillance EGD December 2015 . He had a screening colonoscopy November 2010 with findings of multiple left-sided polyps, exam otherwise normal to the cecum with a fair prep. Polyps were hyperplastic.   Patient is worked in today for evaluation of rectal bleeding. She had an episode of painless rectal bleeding about three weeks ago then another one yesterday. BMs normal in between and also had a normal BM today. Patient is now recalling that he has a history of hemorrhoids and they bled before after eating peanuts. He also ate peanuts around the time of these two recent episodes of bleeding. No constipation or other bowel changes. He otherwise feels fine.   Current Medications, Allergies, Past Medical History, Past Surgical History, Family History and Social History were reviewed in [employer] record.  Physical Exam: General: Pleasant, morbidly obese black male in no acute distress Head: Normocephalic and atraumatic Eyes:  sclerae anicteric, conjunctiva pink  Ears: Normal auditory acuity Lungs: Clear throughout to auscultation Heart: Regular rate and rhythm Abdomen: Soft, obese, non-tender. No obvious masses, normal bowel sounds Rectal: no external lesions. No masses felt. Internal hemorrhoids on anoscopy  Neurological: Alert oriented x 4, grossly nonfocal Psychological:  Alert and cooperative. Normal mood and affect  Assessment and Recommendations:   Pleasant 59 year old male with two episodes of painless rectal bleeding, both times after eating peanuts. He has internal hemorrhoids on exam which I suspect are the source of bleeding. Will treat with steroids suppositories QHS for 10 days then patient will call with a condition update. Further recommendations depending on clinical course.

## 2015-03-09 ENCOUNTER — Encounter: Payer: Self-pay | Admitting: Nurse Practitioner

## 2015-03-09 DIAGNOSIS — K648 Other hemorrhoids: Secondary | ICD-10-CM | POA: Insufficient documentation

## 2015-03-10 NOTE — Progress Notes (Signed)
Reviewed and agree with management. nIf no improvement or recurrence of sxs would consider band ligation. Sandy Salaam. Deatra Ina, M.D., [facility]

## 2015-04-01 ENCOUNTER — Other Ambulatory Visit: Payer: Self-pay | Admitting: Internal Medicine

## 2015-04-01 DIAGNOSIS — I10 Essential (primary) hypertension: Secondary | ICD-10-CM

## 2015-04-02 ENCOUNTER — Ambulatory Visit (INDEPENDENT_AMBULATORY_CARE_PROVIDER_SITE_OTHER): Payer: 59 | Admitting: Physician Assistant

## 2015-04-02 ENCOUNTER — Telehealth: Payer: Self-pay | Admitting: *Deleted

## 2015-04-02 ENCOUNTER — Other Ambulatory Visit: Payer: Self-pay | Admitting: Physician Assistant

## 2015-04-02 ENCOUNTER — Encounter: Payer: Self-pay | Admitting: Physician Assistant

## 2015-04-02 VITALS — BP 148/94 | HR 84 | Temp 97.3°F | Resp 16 | Ht 71.25 in

## 2015-04-02 DIAGNOSIS — M722 Plantar fascial fibromatosis: Secondary | ICD-10-CM

## 2015-04-02 MED ORDER — MELOXICAM 15 MG PO TABS: ORAL_TABLET | ORAL | Status: DC

## 2015-04-02 NOTE — Progress Notes (Signed)
Subjective:    Patient ID: Christopher [name], male    DOB: [birth year], 59 y.o.   MRN: [id number]  HPI 59 y.o. obese AAM with history of HTN, chol, preDM, smoking history Q in 2014, history of elevated CPK presents with right foot pain. He has had right heel pain x 2 weeks, worse in the AM with getting up. Denies numbness, tingling in that foot, denies warmth, swelling in that foot.   Did not take his BP medication today.   Blood pressure 148/94, pulse 84, temperature 97.3 F (36.3 C), resp. rate 16, height 5' 11.25" (1.81 m).  Current Outpatient Prescriptions on File Prior to Visit  Medication Sig Dispense Refill  . 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 allopurinol (ZYLOPRIM) 100 MG tablet Take 1 tablet (100 mg  total) by mouth daily to  prevent gout 90 tablet 1  . ALPRAZolam (XANAX) 1 MG tablet Take 1/2 -1 tab tid 270 tablet 1  . aspirin 81 MG tablet Take 81 mg by mouth daily.      Marland Kitchen buPROPion (WELLBUTRIN XL) 300 MG 24 hr tablet Take 1 tablet by mouth  every morning 90 tablet 1  . Cyanocobalamin (VITAMIN B 12 PO) Take 1 tablet by mouth daily.      . cyclobenzaprine (FLEXERIL) 10 MG tablet Take 1/2 to 1 tab 3 x day for muscle spasms. 90 tablet 99  . fenofibrate 160 MG tablet Take 1 tablet (160 mg total) by mouth daily. 90 tablet 1  . fish oil-omega-3 fatty acids 1000 MG capsule Take 2 g by mouth daily.      . furosemide (LASIX) 20 MG tablet Take 1 tablet by mouth  daily 90 tablet 1  . hydrocortisone (ANUSOL-HC) 2.5 % rectal cream Place 1 application rectally at bedtime. 30 g 1  . magnesium oxide (MAG-OX) 400 MG tablet Take 400 mg by mouth 2 (two) times daily.     . montelukast (SINGULAIR) 10 MG tablet Take 1 tablet by mouth at  bedtime 90 tablet 1  . Multiple Vitamin (MULTIVITAMIN PO) Take 1 tablet by mouth daily.      Marland Kitchen NEXIUM 40 MG capsule Take 1 capsule by mouth two times daily 180 capsule 1  . phentermine (ADIPEX-P) 37.5 MG tablet Take  1 tablet (37.5 mg total) by mouth daily. 30 tablet 2  . potassium chloride (K-DUR) 10 MEQ tablet Take 1 tablet by mouth 3  times a day 270 tablet 1  . sucralfate (CARAFATE) 1 G tablet Take 1 g by mouth 4 (four) times daily.      Marland Kitchen testosterone (TESTIM) 50 MG/5GM (1%) GEL Place 5 g onto the skin daily. 3 Package 3  . valsartan-hydrochlorothiazide (DIOVAN-HCT) 160-25 MG per tablet Take 1 tablet by mouth  daily for blood pressure 90 tablet 1  . vitamin C (ASCORBIC ACID) 500 MG tablet Take 500 mg by mouth 3 (three) times daily.    . Vitamin D, Ergocalciferol, (DRISDOL) 50000 UNITS CAPS Take 50,000 Units by mouth daily. Only takes Monday-saturdays     No current facility-administered medications on file prior to visit.   Past Medical History  Diagnosis Date  . Allergy   . GERD (gastroesophageal reflux disease)   . Hyperlipidemia   . Hypertension   . H/O hiatal hernia   . Prediabetes   . Gout   . Vitamin D deficiency   . Barrett's esophagus    Review  of Systems  Constitutional: Negative.  Negative for fever and chills.  Respiratory: Negative.   Cardiovascular: Positive for leg swelling (bilateral, unchanged). Negative for chest pain and palpitations.  Musculoskeletal: Positive for myalgias and gait problem (right heel pain). Negative for back pain, joint swelling, arthralgias, neck pain and neck stiffness.      Objective:   Physical Exam  Constitutional: No distress.  Pulmonary/Chest: Effort normal and breath sounds normal.  Abdominal: Soft. Bowel sounds are normal.  Musculoskeletal:  Good pulses, good sensation bilaterally. Right heel pain, worse with dorsiflexion, + bilateral leg edema without warmth, redness.       Assessment & Plan:  Plantar Faciitis-  Conservative treatment, night time orthotics, arch support, RICE, NSAID, stretches given If not better will do injection of dexamethasone in 4 weeks.

## 2015-04-02 NOTE — Telephone Encounter (Signed)
Needs work note stating that he cant stand for a long time.  Due to foot condition. Asking if you can write this & put up front for him to come by to get & call him when its ready.

## 2015-04-02 NOTE — Patient Instructions (Signed)
Plantar Fasciitis (Heel Spur Syndrome) with Rehab The plantar fascia is a fibrous, ligament-like, soft-tissue structure that spans the bottom of the foot. Plantar fasciitis is a condition that causes pain in the foot due to inflammation of the tissue. SYMPTOMS   Pain and tenderness on the underneath side of the foot.  Pain that worsens with standing or walking. CAUSES  Plantar fasciitis is caused by irritation and injury to the plantar fascia on the underneath side of the foot. Common mechanisms of injury include:  Direct trauma to bottom of the foot.  Damage to a small nerve that runs under the foot where the main fascia attaches to the heel bone.  Stress placed on the plantar fascia due to bone spurs. RISK INCREASES WITH:   Activities that place stress on the plantar fascia (running, jumping, pivoting, or cutting).  Poor strength and flexibility.  Improperly fitted shoes.  Tight calf muscles.  Flat feet.  Failure to warm-up properly before activity.  Obesity. PREVENTION  Warm up and stretch properly before activity.  Allow for adequate recovery between workouts.  Maintain physical fitness:  Strength, flexibility, and endurance.  Cardiovascular fitness.  Maintain a health body weight.  Avoid stress on the plantar fascia.  Wear properly fitted shoes, including arch supports for individuals who have flat feet. PROGNOSIS  If treated properly, then the symptoms of plantar fasciitis usually resolve without surgery. However, occasionally surgery is necessary. RELATED COMPLICATIONS   Recurrent symptoms that may result in a chronic condition.  Problems of the lower back that are caused by compensating for the injury, such as limping.  Pain or weakness of the foot during push-off following surgery.  Chronic inflammation, scarring, and partial or complete fascia tear, occurring more often from repeated injections. TREATMENT  Treatment initially involves the use of  ice and medication to help reduce pain and inflammation. The use of strengthening and stretching exercises may help reduce pain with activity, especially stretches of the Achilles tendon. These exercises may be performed at home or with a therapist. Your caregiver may recommend that you use heel cups of arch supports to help reduce stress on the plantar fascia. Occasionally, corticosteroid injections are given to reduce inflammation. If symptoms persist for greater than 6 months despite non-surgical (conservative), then surgery may be recommended.  MEDICATION   If pain medication is necessary, then nonsteroidal anti-inflammatory medications, such as aspirin and ibuprofen, or other minor pain relievers, such as acetaminophen, are often recommended.  Do not take pain medication within 7 days before surgery.  Prescription pain relievers may be given if deemed necessary by your caregiver. Use only as directed and only as much as you need.  Corticosteroid injections may be given by your caregiver. These injections should be reserved for the most serious cases, because they may only be given a certain number of times. HEAT AND COLD  Cold treatment (icing) relieves pain and reduces inflammation. Cold treatment should be applied for 10 to 15 minutes every 2 to 3 hours for inflammation and pain and immediately after any activity that aggravates your symptoms. Use ice packs or massage the area with a piece of ice (ice massage).  Heat treatment may be used prior to performing the stretching and strengthening activities prescribed by your caregiver, physical therapist, or athletic trainer. Use a heat pack or soak the injury in warm water. SEEK IMMEDIATE MEDICAL CARE IF:  Treatment seems to offer no benefit, or the condition worsens.  Any medications produce adverse side effects. EXERCISES RANGE   OF MOTION (ROM) AND STRETCHING EXERCISES - Plantar Fasciitis (Heel Spur Syndrome) These exercises may help you  when beginning to rehabilitate your injury. Your symptoms may resolve with or without further involvement from your physician, physical therapist or athletic trainer. While completing these exercises, remember:   Restoring tissue flexibility helps normal motion to return to the joints. This allows healthier, less painful movement and activity.  An effective stretch should be held for at least 30 seconds.  A stretch should never be painful. You should only feel a gentle lengthening or release in the stretched tissue. RANGE OF MOTION - Toe Extension, Flexion  Sit with your right / left leg crossed over your opposite knee.  Grasp your toes and gently pull them back toward the top of your foot. You should feel a stretch on the bottom of your toes and/or foot.  Hold this stretch for __________ seconds.  Now, gently pull your toes toward the bottom of your foot. You should feel a stretch on the top of your toes and or foot.  Hold this stretch for __________ seconds. Repeat __________ times. Complete this stretch __________ times per day.  RANGE OF MOTION - Ankle Dorsiflexion, Active Assisted  Remove shoes and sit on a chair that is preferably not on a carpeted surface.  Place right / left foot under knee. Extend your opposite leg for support.  Keeping your heel down, slide your right / left foot back toward the chair until you feel a stretch at your ankle or calf. If you do not feel a stretch, slide your bottom forward to the edge of the chair, while still keeping your heel down.  Hold this stretch for __________ seconds. Repeat __________ times. Complete this stretch __________ times per day.  STRETCH - Gastroc, Standing  Place hands on wall.  Extend right / left leg, keeping the front knee somewhat bent.  Slightly point your toes inward on your back foot.  Keeping your right / left heel on the floor and your knee straight, shift your weight toward the wall, not allowing your back to  arch.  You should feel a gentle stretch in the right / left calf. Hold this position for __________ seconds. Repeat __________ times. Complete this stretch __________ times per day. STRETCH - Soleus, Standing  Place hands on wall.  Extend right / left leg, keeping the other knee somewhat bent.  Slightly point your toes inward on your back foot.  Keep your right / left heel on the floor, bend your back knee, and slightly shift your weight over the back leg so that you feel a gentle stretch deep in your back calf.  Hold this position for __________ seconds. Repeat __________ times. Complete this stretch __________ times per day. STRETCH - Gastrocsoleus, Standing  Note: This exercise can place a lot of stress on your foot and ankle. Please complete this exercise only if specifically instructed by your caregiver.   Place the ball of your right / left foot on a step, keeping your other foot firmly on the same step.  Hold on to the wall or a rail for balance.  Slowly lift your other foot, allowing your body weight to press your heel down over the edge of the step.  You should feel a stretch in your right / left calf.  Hold this position for __________ seconds.  Repeat this exercise with a slight bend in your right / left knee. Repeat __________ times. Complete this stretch __________ times per day.    STRENGTHENING EXERCISES - Plantar Fasciitis (Heel Spur Syndrome)  These exercises may help you when beginning to rehabilitate your injury. They may resolve your symptoms with or without further involvement from your physician, physical therapist or athletic trainer. While completing these exercises, remember:   Muscles can gain both the endurance and the strength needed for everyday activities through controlled exercises.  Complete these exercises as instructed by your physician, physical therapist or athletic trainer. Progress the resistance and repetitions only as guided. STRENGTH -  Towel Curls  Sit in a chair positioned on a non-carpeted surface.  Place your foot on a towel, keeping your heel on the floor.  Pull the towel toward your heel by only curling your toes. Keep your heel on the floor.  If instructed by your physician, physical therapist or athletic trainer, add ____________________ at the end of the towel. Repeat __________ times. Complete this exercise __________ times per day. STRENGTH - Ankle Inversion  Secure one end of a rubber exercise band/tubing to a fixed object (table, pole). Loop the other end around your foot just before your toes.  Place your fists between your knees. This will focus your strengthening at your ankle.  Slowly, pull your big toe up and in, making sure the band/tubing is positioned to resist the entire motion.  Hold this position for __________ seconds.  Have your muscles resist the band/tubing as it slowly pulls your foot back to the starting position. Repeat __________ times. Complete this exercises __________ times per day.  Document Released: 08/18/2005 Document Revised: 11/10/2011 Document Reviewed: 11/30/2008 [employer] Patient Information 2015 [employer]. This information is not intended to replace advice given to you by your health care provider. Make sure you discuss any questions you have with your health care provider.  

## 2015-04-03 ENCOUNTER — Encounter: Payer: Self-pay | Admitting: Physician Assistant

## 2015-04-30 ENCOUNTER — Ambulatory Visit: Payer: Self-pay | Admitting: Physician Assistant

## 2015-05-08 ENCOUNTER — Ambulatory Visit: Payer: Self-pay | Admitting: Physician Assistant

## 2015-05-09 ENCOUNTER — Encounter: Payer: Self-pay | Admitting: Physician Assistant

## 2015-05-09 ENCOUNTER — Ambulatory Visit (INDEPENDENT_AMBULATORY_CARE_PROVIDER_SITE_OTHER): Payer: 59 | Admitting: Physician Assistant

## 2015-05-09 VITALS — BP 140/100 | HR 72 | Temp 97.0°F | Resp 18 | Ht 71.25 in

## 2015-05-09 DIAGNOSIS — N183 Chronic kidney disease, stage 3 (moderate): Secondary | ICD-10-CM | POA: Diagnosis not present

## 2015-05-09 DIAGNOSIS — M722 Plantar fascial fibromatosis: Secondary | ICD-10-CM | POA: Diagnosis not present

## 2015-05-09 DIAGNOSIS — N1831 Chronic kidney disease, stage 3a: Secondary | ICD-10-CM

## 2015-05-09 DIAGNOSIS — I10 Essential (primary) hypertension: Secondary | ICD-10-CM | POA: Diagnosis not present

## 2015-05-09 NOTE — Progress Notes (Signed)
Assessment and Plan: Elevated BP- suggest stopping phentermine and mobic, monitor at home, call if above 140/90, if still elevated will increase diovan to 320. Weight loss advised. Go to the ER if any CP, SOB, nausea, dizziness, severe HA, changes vision/speech  Plantar faciitis- better, no injection at this time.   Future Appointments Date Time Provider [place]  07/10/2015 10:00 AM Unk Pinto, MD [facility] None    HPI 59 y.o.male presents for BP recheck and plantar faciitis. He states his plantar faciitis is improving and he doe snot want an injection. His BP has been elevated for the last few visit, he states it has been good at home. He has been on mobic and sporadically taking the phentermine. He denies dizziness, HA, SOB, CP.   BP Readings from Last 5 Encounters:  05/09/15 140/100  04/02/15 148/94  03/08/15 124/90  01/02/15 152/102  09/28/14 138/88     Past Medical History  Diagnosis Date  . Allergy   . GERD (gastroesophageal reflux disease)   . Hyperlipidemia   . Hypertension   . H/O hiatal hernia   . Prediabetes   . Gout   . Vitamin D deficiency   . Barrett's esophagus      Allergies  Allergen Reactions  . Hyzaar [Losartan Potassium-Hctz]     Pt doesn't like this medicine for his Bp- it didn't work- no allergy to this med     Current Outpatient Prescriptions on File Prior to Visit  Medication Sig Dispense Refill  . 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 allopurinol (ZYLOPRIM) 100 MG tablet Take 1 tablet (100 mg  total) by mouth daily to  prevent gout 90 tablet 1  . ALPRAZolam (XANAX) 1 MG tablet Take 1/2 -1 tab tid 270 tablet 1  . aspirin 81 MG tablet Take 81 mg by mouth daily.      Marland Kitchen buPROPion (WELLBUTRIN XL) 300 MG 24 hr tablet Take 1 tablet by mouth  every morning 90 tablet 1  . Cyanocobalamin (VITAMIN B 12 PO) Take 1 tablet by mouth daily.      . cyclobenzaprine (FLEXERIL) 10 MG tablet Take 1/2  to 1 tab 3 x day for muscle spasms. 90 tablet 99  . fenofibrate 160 MG tablet Take 1 tablet (160 mg total) by mouth daily. 90 tablet 1  . fish oil-omega-3 fatty acids 1000 MG capsule Take 2 g by mouth daily.      . furosemide (LASIX) 20 MG tablet Take 1 tablet by mouth  daily 90 tablet 1  . magnesium oxide (MAG-OX) 400 MG tablet Take 400 mg by mouth 2 (two) times daily.     . meloxicam (MOBIC) 15 MG tablet Take one daily with food for 2 weeks, can take with tylenol, can not take with aleve, iburpofen, then as needed daily for pain 30 tablet 1  . montelukast (SINGULAIR) 10 MG tablet Take 1 tablet by mouth at  bedtime 90 tablet 1  . Multiple Vitamin (MULTIVITAMIN PO) Take 1 tablet by mouth daily.      Marland Kitchen NEXIUM 40 MG capsule Take 1 capsule by mouth two times daily 180 capsule 1  . phentermine (ADIPEX-P) 37.5 MG tablet Take 1/2 to 1 tablet daily for dieting and weight loss 30 tablet 2  . potassium chloride (K-DUR) 10 MEQ tablet Take 1 tablet by mouth 3  times a day 270 tablet 1  . sucralfate (CARAFATE) 1 G tablet Take  1 g by mouth 4 (four) times daily.      Marland Kitchen testosterone (TESTIM) 50 MG/5GM (1%) GEL Place 5 g onto the skin daily. 3 Package 3  . valsartan-hydrochlorothiazide (DIOVAN-HCT) 160-25 MG per tablet Take 1 tablet by mouth  daily for blood pressure 90 tablet 1  . vitamin C (ASCORBIC ACID) 500 MG tablet Take 500 mg by mouth 3 (three) times daily.    . Vitamin D, Ergocalciferol, (DRISDOL) 50000 UNITS CAPS Take 50,000 Units by mouth daily. Only takes Monday-saturdays     No current facility-administered medications on file prior to visit.    ROS: all negative except above.   Physical Exam: Filed Weights   BP 140/100 mmHg  Pulse 72  Temp(Src) 97 F (36.1 C)  Resp 18  Ht 5' 11.25" (1.81 m)  Wt  General Appearance: Well nourished, in no apparent distress. Eyes: PERRLA, EOMs, conjunctiva no swelling or erythema Sinuses: No Frontal/maxillary tenderness ENT/Mouth: Ext aud canals clear,  TMs without erythema, bulging. No erythema, swelling, or exudate on post pharynx.  Tonsils not swollen or erythematous. Hearing normal.  Neck: Supple, thyroid normal.  Respiratory: Respiratory effort normal, BS equal bilaterally without rales, rhonchi, wheezing or stridor.  Cardio: RRR with no MRGs. Brisk peripheral pulses without edema.  Abdomen: Soft, + BS.  Non tender, no guarding, rebound, hernias, masses. Lymphatics: Non tender without lymphadenopathy.  Musculoskeletal: Full ROM, 5/5 strength, normal gait.  Skin: Warm, dry without rashes, lesions, ecchymosis.  Neuro: Cranial nerves intact. Normal muscle tone, no cerebellar symptoms. Sensation intact.  Psych: Awake and oriented X 3, normal affect, Insight and Judgment appropriate.     Vicie Mutters, PA-C 4:56 PM [facility]

## 2015-05-09 NOTE — Patient Instructions (Signed)
Monitor your blood pressure at home, if it is above 140/90 consistently call the office so we can adjust your medications. Go to the ER if any CP, SOB, nausea, dizziness, severe HA, changes vision/speech  DASH Eating Plan DASH stands for "Dietary Approaches to Stop Hypertension." The DASH eating plan is a healthy eating plan that has been shown to reduce high blood pressure (hypertension). Additional health benefits may include reducing the risk of type 2 diabetes mellitus, heart disease, and stroke. The DASH eating plan may also help with weight loss. WHAT DO I NEED TO KNOW ABOUT THE DASH EATING PLAN? For the DASH eating plan, you will follow these general guidelines:  Choose foods with a percent daily value for sodium of less than 5% (as listed on the food label).  Use salt-free seasonings or herbs instead of table salt or sea salt.  Check with your health care provider or pharmacist before using salt substitutes.  Eat lower-sodium products, often labeled as "lower sodium" or "no salt added."  Eat fresh foods.  Eat more vegetables, fruits, and low-fat dairy products.  Choose whole grains. Look for the word "whole" as the first word in the ingredient list.  Choose fish and skinless chicken or [place] more often than red meat. Limit fish, poultry, and meat to 6 oz (170 g) each day.  Limit sweets, desserts, sugars, and sugary drinks.  Choose heart-healthy fats.  Limit cheese to 1 oz (28 g) per day.  Eat more home-cooked food and less restaurant, buffet, and fast food.  Limit fried foods.  Cook foods using methods other than frying.  Limit canned vegetables. If you do use them, rinse them well to decrease the sodium.  When eating at a restaurant, ask that your food be prepared with less salt, or no salt if possible. WHAT FOODS CAN I EAT? Seek help from a dietitian for individual calorie needs. Grains Whole grain or whole wheat bread. Brown rice. Whole grain or whole wheat pasta.  Quinoa, bulgur, and whole grain cereals. Low-sodium cereals. Corn or whole wheat flour tortillas. Whole grain cornbread. Whole grain crackers. Low-sodium crackers. Vegetables Fresh or frozen vegetables (raw, steamed, roasted, or grilled). Low-sodium or reduced-sodium tomato and vegetable juices. Low-sodium or reduced-sodium tomato sauce and paste. Low-sodium or reduced-sodium canned vegetables.  Fruits All fresh, canned (in natural juice), or frozen fruits. Meat and Other Protein Products Ground beef (85% or leaner), grass-fed beef, or beef trimmed of fat. Skinless chicken or [place]. Ground chicken or [place]. Pork trimmed of fat. All fish and seafood. Eggs. Dried beans, peas, or lentils. Unsalted nuts and seeds. Unsalted canned beans. Dairy Low-fat dairy products, such as skim or 1% milk, 2% or reduced-fat cheeses, low-fat ricotta or cottage cheese, or plain low-fat yogurt. Low-sodium or reduced-sodium cheeses. Fats and Oils Tub margarines without trans fats. Light or reduced-fat mayonnaise and salad dressings (reduced sodium). Avocado. Safflower, olive, or canola oils. Natural peanut or almond butter. Other Unsalted popcorn and pretzels. The items listed above may not be a complete list of recommended foods or beverages. Contact your dietitian for more options. WHAT FOODS ARE NOT RECOMMENDED? Grains White bread. White pasta. White rice. Refined cornbread. Bagels and croissants. Crackers that contain trans fat. Vegetables Creamed or fried vegetables. Vegetables in a cheese sauce. Regular canned vegetables. Regular canned tomato sauce and paste. Regular tomato and vegetable juices. Fruits Dried fruits. Canned fruit in light or heavy syrup. Fruit juice. Meat and Other Protein Products Fatty cuts of meat. Ribs, chicken wings,  bacon, sausage, bologna, salami, chitterlings, fatback, hot dogs, bratwurst, and packaged luncheon meats. Salted nuts and seeds. Canned beans with salt. Dairy Whole or 2%  milk, cream, half-and-half, and cream cheese. Whole-fat or sweetened yogurt. Full-fat cheeses or blue cheese. Nondairy creamers and whipped toppings. Processed cheese, cheese spreads, or cheese curds. Condiments Onion and garlic salt, seasoned salt, table salt, and sea salt. Canned and packaged gravies. Worcestershire sauce. Tartar sauce. Barbecue sauce. Teriyaki sauce. Soy sauce, including reduced sodium. Steak sauce. Fish sauce. Oyster sauce. Cocktail sauce. Horseradish. Ketchup and mustard. Meat flavorings and tenderizers. Bouillon cubes. Hot sauce. Tabasco sauce. Marinades. Taco seasonings. Relishes. Fats and Oils Butter, stick margarine, lard, shortening, ghee, and bacon fat. Coconut, palm kernel, or palm oils. Regular salad dressings. Other Pickles and olives. Salted popcorn and pretzels. The items listed above may not be a complete list of foods and beverages to avoid. Contact your dietitian for more information. WHERE CAN I FIND MORE INFORMATION? National Heart, Lung, and [employer] Institute: www.nhlbi.nih.gov/health/health-topics/topics/dash/ Document Released: 08/07/2011 Document Revised: 01/02/2014 Document Reviewed: 06/22/2013 [employer] Patient Information 2015 [employer]. This information is not intended to replace advice given to you by your health care provider. Make sure you discuss any questions you have with your health care provider.  

## 2015-06-28 ENCOUNTER — Encounter: Payer: Self-pay | Admitting: Physician Assistant

## 2015-06-28 ENCOUNTER — Ambulatory Visit (INDEPENDENT_AMBULATORY_CARE_PROVIDER_SITE_OTHER): Payer: 59 | Admitting: Physician Assistant

## 2015-06-28 VITALS — BP 160/100 | HR 100 | Temp 98.1°F | Resp 16 | Ht 71.25 in

## 2015-06-28 DIAGNOSIS — M6530 Trigger finger, unspecified finger: Secondary | ICD-10-CM | POA: Diagnosis not present

## 2015-06-28 DIAGNOSIS — Z23 Encounter for immunization: Secondary | ICD-10-CM

## 2015-06-28 DIAGNOSIS — J069 Acute upper respiratory infection, unspecified: Secondary | ICD-10-CM | POA: Diagnosis not present

## 2015-06-28 MED ORDER — PREDNISONE 20 MG PO TABS: ORAL_TABLET | ORAL | Status: DC

## 2015-06-28 MED ORDER — AZITHROMYCIN 250 MG PO TABS: ORAL_TABLET | ORAL | Status: AC

## 2015-06-28 MED ORDER — ALBUTEROL SULFATE HFA 108 (90 BASE) MCG/ACT IN AERS: 2.0000 | INHALATION_SPRAY | RESPIRATORY_TRACT | Status: DC | PRN

## 2015-06-28 MED ORDER — HYDROCODONE-ACETAMINOPHEN 5-325 MG PO TABS: 1.0000 | ORAL_TABLET | Freq: Four times a day (QID) | ORAL | Status: DC | PRN

## 2015-06-28 NOTE — Progress Notes (Signed)
Subjective:    Patient ID: Christopher [name], male    DOB: November 29, 1955, 59 y.o.   MRN: [id number]  HPI 59 y.o. obese, former smoking Q 2014 AAM with history of HTN, CKD, chol, preDM presents with cold symptoms x 2 days. Some chills no fever, cough with productive green mucus, with chest pain with coughing only.  Has been taking mucinex cold and flu, alk plus without relief.   He is right handed, has also had right thumb catching x 2-3 weeks. Has been wearing splint at home not helping, no wrist pain, no weakness, numbness/tingling.   There were no vitals taken for this visit.  Past Medical History  Diagnosis Date  . Allergy   . GERD (gastroesophageal reflux disease)   . Hyperlipidemia   . Hypertension   . H/O hiatal hernia   . Prediabetes   . Gout   . Vitamin D deficiency   . Barrett's esophagus    Current Outpatient Prescriptions on File Prior to Visit  Medication Sig Dispense Refill  . 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 allopurinol (ZYLOPRIM) 100 MG tablet Take 1 tablet (100 mg  total) by mouth daily to  prevent gout 90 tablet 1  . ALPRAZolam (XANAX) 1 MG tablet Take 1/2 -1 tab tid 270 tablet 1  . aspirin 81 MG tablet Take 81 mg by mouth daily.      Marland Kitchen buPROPion (WELLBUTRIN XL) 300 MG 24 hr tablet Take 1 tablet by mouth  every morning 90 tablet 1  . Cyanocobalamin (VITAMIN B 12 PO) Take 1 tablet by mouth daily.      . cyclobenzaprine (FLEXERIL) 10 MG tablet Take 1/2 to 1 tab 3 x day for muscle spasms. 90 tablet 99  . fenofibrate 160 MG tablet Take 1 tablet (160 mg total) by mouth daily. 90 tablet 1  . fish oil-omega-3 fatty acids 1000 MG capsule Take 2 g by mouth daily.      . furosemide (LASIX) 20 MG tablet Take 1 tablet by mouth  daily 90 tablet 1  . magnesium oxide (MAG-OX) 400 MG tablet Take 400 mg by mouth 2 (two) times daily.     . meloxicam (MOBIC) 15 MG tablet Take one daily with food for 2 weeks, can take with tylenol,  can not take with aleve, iburpofen, then as needed daily for pain 30 tablet 1  . montelukast (SINGULAIR) 10 MG tablet Take 1 tablet by mouth at  bedtime 90 tablet 1  . Multiple Vitamin (MULTIVITAMIN PO) Take 1 tablet by mouth daily.      Marland Kitchen NEXIUM 40 MG capsule Take 1 capsule by mouth two times daily 180 capsule 1  . phentermine (ADIPEX-P) 37.5 MG tablet Take 1/2 to 1 tablet daily for dieting and weight loss 30 tablet 2  . potassium chloride (K-DUR) 10 MEQ tablet Take 1 tablet by mouth 3  times a day 270 tablet 1  . sucralfate (CARAFATE) 1 G tablet Take 1 g by mouth 4 (four) times daily.      Marland Kitchen testosterone (TESTIM) 50 MG/5GM (1%) GEL Place 5 g onto the skin daily. 3 Package 3  . valsartan-hydrochlorothiazide (DIOVAN-HCT) 160-25 MG per tablet Take 1 tablet by mouth  daily for blood pressure 90 tablet 1  . vitamin C (ASCORBIC ACID) 500 MG tablet Take 500 mg by mouth 3 (three) times daily.    . Vitamin D, Ergocalciferol, (DRISDOL) 50000 UNITS  CAPS Take 50,000 Units by mouth daily. Only takes Monday-saturdays     No current facility-administered medications on file prior to visit.    Review of Systems  Constitutional: Negative for fever, chills and diaphoresis.  HENT: Positive for congestion, postnasal drip, rhinorrhea, sinus pressure and sore throat. Negative for ear pain, sneezing, trouble swallowing and voice change.   Eyes: Negative.   Respiratory: Positive for cough, shortness of breath and wheezing. Negative for chest tightness.   Cardiovascular: Negative.   Gastrointestinal: Negative.   Genitourinary: Negative.   Musculoskeletal: Positive for arthralgias (right thumb). Negative for myalgias, back pain, joint swelling, gait problem, neck pain and neck stiffness.  Neurological: Negative.  Negative for headaches.       Objective:   Physical Exam  Constitutional: He is oriented to person, place, and time. He appears well-developed and well-nourished.  HENT:  Head: Normocephalic and  atraumatic.  Right Ear: External ear normal.  Left Ear: External ear normal.  Nose: Nose normal.  Mouth/Throat: Oropharynx is clear and moist.  Eyes: Conjunctivae are normal. Pupils are equal, round, and reactive to light.  Neck: Normal range of motion. Neck supple.  Cardiovascular: Normal rate, regular rhythm and normal heart sounds.   No murmur heard. Pulmonary/Chest: Effort normal. No respiratory distress. He has wheezes. He has no rales. He exhibits no tenderness.  Abdominal: Soft. Bowel sounds are normal.  Musculoskeletal:  Right thumb with catching, no erythema, swelling, warmth. Good distal neurovascular exam.   Lymphadenopathy:    He has no cervical adenopathy.  Neurological: He is alert and oriented to person, place, and time.  Skin: Skin is warm and dry.       Assessment & Plan:  URI Likely viral at this time, Will hold the zpak and take if he is not getting better, predniosone, increase fluids, rest, cont allergy pill  Right hand trigger finger Prednisone will help this as well but will refer to ortho since right handed and right thumb  Hypertension Recheck 140/90, can be due to medications for cold, monitor at home, go to ER if any headache, vision changes, CP, SOB.

## 2015-06-28 NOTE — Patient Instructions (Signed)
I will give you a prescription for an antibiotic, but please only take it if you are not feeling better in 7-10 days.  Bronchitis is mostly caused by viruses and the antibiotic will do nothing.  PLEASE TRY TO DO OVER THE COUNTER TREATMENT AND/OR PREDNISONE FOR 5-7 DAYS AND IF YOU ARE NOT GETTING BETTER OR GETTING WORSE THEN YOU CAN START ON AN ANTIBIOTIC GIVEN.  Can take the prednisone AT NIGHT WITH DINNER, it take 8-12 hours to start working so it will NOT affect your sleeping if you take it at night with your food!! Take two pills the first night and 1 or two pill the second night and then 1 pill the other nights.    Rest and stay hydrated.  Make sure you drink plenty of fluids to make sure urine is clear when you urinate.  Water will help thin out mucous. - Take Mucinex DM- Maximum Strength over the counter to thin out and cough up the thick mucous.  Please follow directions on box. -Take Albuterol if prescribed.  Risk of antibiotic use: About 1 in 4 people who take antibiotics have side effects including stomach problems, dizziness, or rashes. Those problems clear up soon after stopping the drugs, but in rare cases antibiotics can cause severe allergic reaction. Over use of antibiotics also encourages the growth of bacteria that can't be controlled easily with drugs. That makes you more vunerable to antibiotic-resistant infections and undermines the benefits of antibiotics for others.   Waste of Money: Antibiotics often aren't very expensive, but any money spent on unnecessary drugs is money down the drain.   When are antibiotics needed? Only when symptoms last longer than a week.  Start to improve but then worsen again  Please call the office or message through My Chart if you have any questions.   Acute Bronchitis Bronchitis is when the airways that extend from the windpipe into the lungs get red, puffy, and painful (inflamed). Bronchitis often causes thick spit (mucus) to develop. This  leads to a cough. A cough is the most common symptom of bronchitis. In acute bronchitis, the condition usually begins suddenly and goes away over time (usually in 2 weeks). Smoking, allergies, and asthma can make bronchitis worse. Repeated episodes of bronchitis may cause more lung problems.  Most common cause of Bronchitis is viruses (rhinovirus, coronavirus, RSV).  Therefore, not requiring an antibiotic; as antibiotics only treat bacterial infections.  HOME CARE  Rest.  Drink enough fluids to keep your pee (urine) clear or pale yellow (unless you need to limit fluids as told by your doctor).  Only take over-the-counter or prescription medicines as told by your doctor.  Avoid smoking and secondhand smoke. These can make bronchitis worse. If you are a smoker, think about using nicotine gum or skin patches. Quitting smoking will help your lungs heal faster.  Reduce the chance of getting bronchitis again by:  Washing your hands often.  Avoiding people with cold symptoms.  Trying not to touch your hands to your mouth, nose, or eyes.  Follow up with your doctor as told. GET HELP IF: Your symptoms do not improve after 1 week of treatment. Symptoms include:  Cough.  Fever.  Coughing up thick spit.  Body aches.  Chest congestion.  Chills.  Shortness of breath.  Sore throat. GET HELP RIGHT AWAY IF:   You have an increased fever.  You have chills.  You have severe shortness of breath.  You have bloody thick spit (sputum).    You throw up (vomit) often.  You lose too much body fluid (dehydration).  You have a severe headache.  You faint. MAKE SURE YOU:   Understand these instructions.  Will watch your condition.  Will get help right away if you are not doing well or get worse. Document Released: 02/04/2008 Document Revised: 04/20/2013 Document Reviewed: 02/08/2013 [facility] Patient Information 2015 [place], [place]. This information is not intended to replace  advice given to you by your health care provider. Make sure you discuss any questions you have with your health care provider.  Trigger Finger Trigger finger (digital tendinitis and stenosing tenosynovitis) is a common disorder that causes an often painful catching of the fingers or thumb. It occurs as a clicking, snapping, or locking of a finger in the palm of the hand. This is caused by a problem with the tendons that flex or bend the fingers sliding smoothly through their sheaths. The condition may occur in any finger or a couple fingers at the same time.  The finger may lock with the finger curled or suddenly straighten out with a snap. This is more common in patients with rheumatoid arthritis and diabetes. Left untreated, the condition may get worse to the point where the finger becomes locked in flexion, like making a fist, or less commonly locked with the finger straightened out. CAUSES   Inflammation and scarring that lead to swelling around the tendon sheath.  Repeated or forceful movements.  Rheumatoid arthritis, an autoimmune disease that affects joints.  Gout.  Diabetes mellitus. SIGNS AND SYMPTOMS  Soreness and swelling of your finger.  A painful clicking or snapping as you bend and straighten your finger. DIAGNOSIS  Your health care provider will do a physical exam of your finger to diagnose trigger finger. TREATMENT   Splinting for 6-8 weeks may be helpful.  Nonsteroidal anti-inflammatory medicines (NSAIDs) can help to relieve the pain and inflammation.  Cortisone injections, along with splinting, may speed up recovery. Several injections may be required. Cortisone may give relief after one injection.  Surgery is another treatment that may be used if conservative treatments do not work. Surgery can be minor, without incisions (a cut does not have to be made), and can be done with a needle through the skin.  Other surgical choices involve an open procedure in which the  surgeon opens the hand through a small incision and cuts the pulley so the tendon can again slide smoothly. Your hand will still work fine. HOME CARE INSTRUCTIONS  Apply ice to the injured area, twice per day:  Put ice in a plastic bag.  Place a towel between your skin and the bag.  Leave the ice on for 20 minutes, 3-4 times a day.  Rest your hand often. MAKE SURE YOU:   Understand these instructions.  Will watch your condition.  Will get help right away if you are not doing well or get worse.   This information is not intended to replace advice given to you by your health care provider. Make sure you discuss any questions you have with your health care provider.   Document Released: 06/07/2004 Document Revised: 04/20/2013 Document Reviewed: 01/18/2013 [employer] Interactive Patient Education [employer].

## 2015-07-04 ENCOUNTER — Encounter: Payer: Self-pay | Admitting: Physician Assistant

## 2015-07-06 DIAGNOSIS — M65319 Trigger thumb, unspecified thumb: Secondary | ICD-10-CM | POA: Insufficient documentation

## 2015-07-10 ENCOUNTER — Encounter: Payer: Self-pay | Admitting: Internal Medicine

## 2015-07-10 ENCOUNTER — Ambulatory Visit (INDEPENDENT_AMBULATORY_CARE_PROVIDER_SITE_OTHER): Payer: 59 | Admitting: Internal Medicine

## 2015-07-10 VITALS — BP 142/88 | HR 98 | Temp 98.4°F | Resp 18 | Ht 71.5 in | Wt 324.0 lb

## 2015-07-10 DIAGNOSIS — E785 Hyperlipidemia, unspecified: Secondary | ICD-10-CM | POA: Diagnosis not present

## 2015-07-10 DIAGNOSIS — K219 Gastro-esophageal reflux disease without esophagitis: Secondary | ICD-10-CM

## 2015-07-10 DIAGNOSIS — Z125 Encounter for screening for malignant neoplasm of prostate: Secondary | ICD-10-CM

## 2015-07-10 DIAGNOSIS — M1000 Idiopathic gout, unspecified site: Secondary | ICD-10-CM

## 2015-07-10 DIAGNOSIS — R6889 Other general symptoms and signs: Secondary | ICD-10-CM | POA: Diagnosis not present

## 2015-07-10 DIAGNOSIS — Z0001 Encounter for general adult medical examination with abnormal findings: Secondary | ICD-10-CM | POA: Diagnosis not present

## 2015-07-10 DIAGNOSIS — I10 Essential (primary) hypertension: Secondary | ICD-10-CM

## 2015-07-10 DIAGNOSIS — R7303 Prediabetes: Secondary | ICD-10-CM | POA: Diagnosis not present

## 2015-07-10 DIAGNOSIS — Z1212 Encounter for screening for malignant neoplasm of rectum: Secondary | ICD-10-CM

## 2015-07-10 DIAGNOSIS — E349 Endocrine disorder, unspecified: Secondary | ICD-10-CM

## 2015-07-10 DIAGNOSIS — E559 Vitamin D deficiency, unspecified: Secondary | ICD-10-CM

## 2015-07-10 DIAGNOSIS — N183 Chronic kidney disease, stage 3 (moderate): Secondary | ICD-10-CM

## 2015-07-10 DIAGNOSIS — R5383 Other fatigue: Secondary | ICD-10-CM

## 2015-07-10 DIAGNOSIS — Z6841 Body Mass Index (BMI) 40.0 and over, adult: Secondary | ICD-10-CM

## 2015-07-10 DIAGNOSIS — N1831 Chronic kidney disease, stage 3a: Secondary | ICD-10-CM

## 2015-07-10 DIAGNOSIS — Z79899 Other long term (current) drug therapy: Secondary | ICD-10-CM

## 2015-07-10 DIAGNOSIS — E291 Testicular hypofunction: Secondary | ICD-10-CM | POA: Diagnosis not present

## 2015-07-10 LAB — CBC WITH DIFFERENTIAL/PLATELET
BASOS ABS: 0 10*3/uL (ref 0.0–0.1)
BASOS PCT: 0 % (ref 0–1)
EOS ABS: 0.2 10*3/uL (ref 0.0–0.7)
Eosinophils Relative: 2 % (ref 0–5)
HCT: 42.9 % (ref 39.0–52.0)
Hemoglobin: 14.6 g/dL (ref 13.0–17.0)
Lymphocytes Relative: 30 % (ref 12–46)
Lymphs Abs: 2.7 10*3/uL (ref 0.7–4.0)
MCH: 31 pg (ref 26.0–34.0)
MCHC: 34 g/dL (ref 30.0–36.0)
MCV: 91.1 fL (ref 78.0–100.0)
MPV: 9.9 fL (ref 8.6–12.4)
Monocytes Absolute: 1 10*3/uL (ref 0.1–1.0)
Monocytes Relative: 11 % (ref 3–12)
NEUTROS PCT: 57 % (ref 43–77)
Neutro Abs: 5.1 10*3/uL (ref 1.7–7.7)
PLATELETS: 169 10*3/uL (ref 150–400)
RBC: 4.71 MIL/uL (ref 4.22–5.81)
RDW: 13.8 % (ref 11.5–15.5)
WBC: 8.9 10*3/uL (ref 4.0–10.5)

## 2015-07-10 LAB — BASIC METABOLIC PANEL WITH GFR
BUN: 15 mg/dL (ref 7–25)
CHLORIDE: 100 mmol/L (ref 98–110)
CO2: 28 mmol/L (ref 20–31)
CREATININE: 0.97 mg/dL (ref 0.70–1.33)
Calcium: 9.9 mg/dL (ref 8.6–10.3)
GFR, Est Non African American: 86 mL/min (ref >=60)
Glucose, Bld: 124 mg/dL — ABNORMAL HIGH (ref 65–99)
POTASSIUM: 4.1 mmol/L (ref 3.5–5.3)
SODIUM: 138 mmol/L (ref 135–146)

## 2015-07-10 LAB — HEPATIC FUNCTION PANEL
ALBUMIN: 4.2 g/dL (ref 3.6–5.1)
ALK PHOS: 68 U/L (ref 40–115)
ALT: 25 U/L (ref 9–46)
AST: 22 U/L (ref 10–35)
BILIRUBIN TOTAL: 0.5 mg/dL (ref 0.2–1.2)
Bilirubin, Direct: 0.1 mg/dL (ref <=0.2)
Indirect Bilirubin: 0.4 mg/dL (ref 0.2–1.2)
TOTAL PROTEIN: 7 g/dL (ref 6.1–8.1)

## 2015-07-10 LAB — HEMOGLOBIN A1C
HEMOGLOBIN A1C: 6.1 % — AB (ref <5.7)
MEAN PLASMA GLUCOSE: 128 mg/dL — AB (ref <117)

## 2015-07-10 LAB — LIPID PANEL
Cholesterol: 156 mg/dL (ref 125–200)
HDL: 36 mg/dL — ABNORMAL LOW (ref >=40)
LDL CALC: 77 mg/dL (ref <130)
Total CHOL/HDL Ratio: 4.3 Ratio (ref <=5.0)
Triglycerides: 216 mg/dL — ABNORMAL HIGH (ref <150)
VLDL: 43 mg/dL — ABNORMAL HIGH (ref <30)

## 2015-07-10 LAB — IRON AND TIBC
%SAT: 25 % (ref 15–60)
IRON: 87 ug/dL (ref 50–180)
TIBC: 350 ug/dL (ref 250–425)
UIBC: 263 ug/dL (ref 125–400)

## 2015-07-10 LAB — MAGNESIUM: MAGNESIUM: 1.8 mg/dL (ref 1.5–2.5)

## 2015-07-10 LAB — URIC ACID: URIC ACID, SERUM: 8.5 mg/dL — AB (ref 4.0–7.8)

## 2015-07-10 NOTE — Patient Instructions (Signed)
Recommend Adult Low Dose Aspirin or   coated  Aspirin 81 mg daily   To reduce risk of Colon Cancer 20 %,   Skin Cancer 26 % ,   Melanoma 46%   and   Pancreatic cancer 60%   ++++++++++++++++++++++++++++++++++++++++++++++++++++++  Vitamin D goal   is between 70-100.   Please make sure that you are taking your Vitamin D as directed.   It is very important as a natural anti-inflammatory   helping hair, skin, and nails, as well as reducing stroke and heart attack risk.   It helps your bones and helps with mood.  It also decreases numerous cancer risks so please take it as directed.   Low Vit D is associated with a 200-300% higher risk for CANCER   and 200-300% higher risk for HEART   ATTACK  &  STROKE.   ......................................  It is also associated with higher death rate at younger ages,   autoimmune diseases like Rheumatoid arthritis, Lupus, Multiple Sclerosis.     Also many other serious conditions, like depression, Alzheimer's  Dementia, infertility, muscle aches, fatigue, fibromyalgia - just to name a few.  ++++++++++++++++++++++++++++++++++++++++++++++++  Recommend the book "The END of DIETING" by Dr Joel Fuhrman   & the book "The END of DIABETES " by Dr Joel Fuhrman  At [employer] - get book & Audio CD's     Being diabetic has a  300% increased risk for heart attack, stroke, cancer, and alzheimer- type vascular dementia. It is very important that you work harder with diet by avoiding all foods that are white. Avoid white rice (brown & wild rice is OK), white potatoes (sweetpotatoes in moderation is OK), White bread or wheat bread or anything made out of white flour like bagels, donuts, rolls, buns, biscuits, cakes, pastries, cookies, pizza crust, and pasta (made from white flour & egg whites) - vegetarian pasta or spinach or wheat pasta is OK. Multigrain breads like Arnold's or [employer], or multigrain sandwich thins or flatbreads.  Diet,  exercise and weight loss can reverse and cure diabetes in the early stages.  Diet, exercise and weight loss is very important in the control and prevention of complications of diabetes which affects every system in your body, ie. Brain - dementia/stroke, eyes - glaucoma/blindness, heart - heart attack/heart failure, kidneys - dialysis, stomach - gastric paralysis, intestines - malabsorption, nerves - severe painful neuritis, circulation - gangrene & loss of a leg(s), and finally cancer and Alzheimers.    I recommend avoid fried & greasy foods,  sweets/candy, white rice (brown or wild rice or Quinoa is OK), white potatoes (sweet potatoes are OK) - anything made from white flour - bagels, doughnuts, rolls, buns, biscuits,white and wheat breads, pizza crust and traditional pasta made of white flour & egg white(vegetarian pasta or spinach or wheat pasta is OK).  Multi-grain bread is OK - like multi-grain flat bread or sandwich thins. Avoid alcohol in excess. Exercise is also important.    Eat all the vegetables you want - avoid meat, especially red meat and dairy - especially cheese.  Cheese is the most concentrated form of trans-fats which is the worst thing to clog up our arteries. Veggie cheese is OK which can be found in the fresh produce section at [employer] or [employer] or [employer]  ++++++++++++++++++++++++++++++++++++++++++++++++++ DASH Eating Plan  DASH stands for "Dietary Approaches to Stop Hypertension."   The DASH eating plan is a healthy eating plan that has been shown to reduce high   blood pressure (hypertension). Additional health benefits [place] include reducing the risk of type 2 diabetes mellitus, heart disease, and stroke. The DASH eating plan Poet also help with weight loss.  WHAT DO I NEED TO KNOW ABOUT THE DASH EATING PLAN? For the DASH eating plan, you will follow these general guidelines:  Choose foods with a percent daily value for sodium of less than 5% (as listed on the food  label).  Use salt-free seasonings or herbs instead of table salt or sea salt.  Check with your health care provider or pharmacist before using salt substitutes.  Eat lower-sodium products, often labeled as "lower sodium" or "no salt added."  Eat fresh foods.  Eat more vegetables, fruits, and low-fat dairy products.    Choose whole grains. Look for the word "whole" as the first word in the ingredient list.  Choose fish   Limit sweets, desserts, sugars, and sugary drinks.  Choose heart-healthy fats.  Eat veggie cheese   Eat more home-cooked food and less restaurant, buffet, and fast food.  Limit fried foods.  Cook foods using methods other than frying.  Limit canned vegetables. If you do use them, rinse them well to decrease the sodium.  When eating at a restaurant, ask that your food be prepared with less salt, or no salt if possible.                      WHAT FOODS CAN I EAT?  Seek help from a dietitian for individual calorie needs. Grains Whole grain or whole wheat bread. Brown rice. Whole grain or whole wheat pasta. Quinoa, bulgur, and whole grain cereals. Low-sodium cereals. Corn or whole wheat flour tortillas. Whole grain cornbread. Whole grain crackers. Low-sodium crackers.  Vegetables Fresh or frozen vegetables (raw, steamed, roasted, or grilled). Low-sodium or reduced-sodium tomato and vegetable juices. Low-sodium or reduced-sodium tomato sauce and paste. Low-sodium or reduced-sodium canned vegetables.   Fruits All fresh, canned (in natural juice), or frozen fruits.  Meat and Other Protein Products  All fish and seafood.  Dried beans, peas, or lentils. Unsalted nuts and seeds. Unsalted canned beans. Dairy Low-fat dairy products, such as skim or 1% milk, 2% or reduced-fat cheeses, low-fat ricotta or cottage cheese, or plain low-fat yogurt. Low-sodium or reduced-sodium cheeses.  Fats and Oils Tub margarines without trans fats. Light or reduced-fat mayonnaise  and salad dressings (reduced sodium). Avocado. Safflower, olive, or canola oils. Natural peanut or almond butter.  Other Unsalted popcorn and pretzels. The items listed above [place] not be a complete list of recommended foods or beverages. Contact your dietitian for more options.  +++++++++++++++++++++++++++++++++++++++++++  WHAT FOODS ARE NOT RECOMMENDED?  Grains/ White flour or wheat flour  White bread. White pasta. White rice. Refined cornbread. Bagels and croissants. Crackers that contain trans fat.  Vegetables  Creamed or fried vegetables. Vegetables in a . Regular canned vegetables. Regular canned tomato sauce and paste. Regular tomato and vegetable juices.  Fruits Dried fruits. Canned fruit in light or heavy syrup. Fruit juice.  Meat and Other Protein Products Meat in general. Fatty cuts of meat. Ribs, chicken wings, bacon, sausage, bologna, salami, chitterlings, fatback, hot dogs, bratwurst, and packaged luncheon meats. Salted nuts and seeds. Canned beans with salt.  Dairy Whole or 2% milk, cream, half-and-half, and cream cheese. Whole-fat or sweetened yogurt. Full-fat cheeses or blue cheese. Nondairy creamers and whipped toppings. Processed cheese, cheese spreads, or cheese curds.  Condiments Onion and garlic salt, seasoned salt, table salt, and sea  salt. Canned and packaged gravies. Worcestershire sauce. Tartar sauce. Barbecue sauce. Teriyaki sauce. Soy sauce, including reduced sodium. Steak sauce. Fish sauce. Oyster sauce. Cocktail sauce. Horseradish. Ketchup and mustard. Meat flavorings and tenderizers. Bouillon cubes. Hot sauce. Tabasco sauce. Marinades. Taco seasonings. Relishes.  Fats and Oils Butter, stick margarine, lard, shortening, ghee, and bacon fat. Coconut, palm kernel, or palm oils. Regular salad dressings.  Pickles and olives. Salted popcorn and pretzels. The items listed above may not be a complete list of foods and beverages to avoid.   Preventive Care for  Adults  A healthy lifestyle and preventive care can promote health and wellness. Preventive health guidelines for men include the following key practices:  A routine yearly physical is a good way to check with your health care provider about your health and preventative screening. It is a chance to share any concerns and updates on your health and to receive a thorough exam.  Visit your dentist for a routine exam and preventative care every 6 months. Brush your teeth twice a day and floss once a day. Good oral hygiene prevents tooth decay and gum disease.  The frequency of eye exams is based on your age, health, family medical history, use of contact lenses, and other factors. Follow your health care provider's recommendations for frequency of eye exams.  Eat a healthy diet. Foods such as vegetables, fruits, whole grains, low-fat dairy products, and lean protein foods contain the nutrients you need without too many calories. Decrease your intake of foods high in solid fats, added sugars, and salt. Eat the right amount of calories for you.Get information about a proper diet from your health care provider, if necessary.  Regular physical exercise is one of the most important things you can do for your health. Most adults should get at least 150 minutes of moderate-intensity exercise (any activity that increases your heart rate and causes you to sweat) each week. In addition, most adults need muscle-strengthening exercises on 2 or more days a week.  Maintain a healthy weight. The body mass index (BMI) is a screening tool to identify possible weight problems. It provides an estimate of body fat based on height and weight. Your health care provider can find your BMI and can help you achieve or maintain a healthy weight.For adults 20 years and older:  A BMI below 18.5 is considered underweight.  A BMI of 18.5 to 24.9 is normal.  A BMI of 25 to 29.9 is considered overweight.  A BMI of 30 and above  is considered obese.  Maintain normal blood lipids and cholesterol levels by exercising and minimizing your intake of saturated fat. Eat a balanced diet with plenty of fruit and vegetables. Blood tests for lipids and cholesterol should begin at age 20 and be repeated every 5 years. If your lipid or cholesterol levels are high, you are over 50, or you are at high risk for heart disease, you may need your cholesterol levels checked more frequently.Ongoing high lipid and cholesterol levels should be treated with medicines if diet and exercise are not working.  If you smoke, find out from your health care provider how to quit. If you do not use tobacco, do not start.  Lung cancer screening is recommended for adults aged 55-80 years who are at high risk for developing lung cancer because of a history of smoking. A yearly low-dose CT scan of the lungs is recommended for people who have at least a 30-pack-year history of smoking and are a   current smoker or have quit within the past 15 years. A pack year of smoking is smoking an average of 1 pack of cigarettes a day for 1 year (for example: 1 pack a day for 30 years or 2 packs a day for 15 years). Yearly screening should continue until the smoker has stopped smoking for at least 15 years. Yearly screening should be stopped for people who develop a health problem that would prevent them from having lung cancer treatment.  If you choose to drink alcohol, do not have more than 2 drinks per day. One drink is considered to be 12 ounces (355 mL) of beer, 5 ounces (148 mL) of wine, or 1.5 ounces (44 mL) of liquor.  Avoid use of street drugs. Do not share needles with anyone. Ask for help if you need support or instructions about stopping the use of drugs.  High blood pressure causes heart disease and increases the risk of stroke. Your blood pressure should be checked at least every 1-2 years. Ongoing high blood pressure should be treated with medicines, if weight loss  and exercise are not effective.  If you are 21-75 years old, ask your health care provider if you should take aspirin to prevent heart disease.  Diabetes screening involves taking a blood sample to check your fasting blood sugar level. This should be done once every 3 years, after age 61, if you are within normal weight and without risk factors for diabetes. Testing should be considered at a younger age or be carried out more frequently if you are overweight and have at least 1 risk factor for diabetes.  Colorectal cancer can be detected and often prevented. Most routine colorectal cancer screening begins at the age of 3 and continues through age 41. However, your health care provider may recommend screening at an earlier age if you have risk factors for colon cancer. On a yearly basis, your health care provider may provide home test kits to check for hidden blood in the stool. Use of a small camera at the end of a tube to directly examine the colon (sigmoidoscopy or colonoscopy) can detect the earliest forms of colorectal cancer. Talk to your health care provider about this at age 44, when routine screening begins. Direct exam of the colon should be repeated every 5-10 years through age 74, unless early forms of precancerous polyps or small growths are found.   Talk with your health care provider about prostate cancer screening.  Testicular cancer screening isrecommended for adult males. Screening includes self-exam, a health care provider exam, and other screening tests. Consult with your health care provider about any symptoms you have or any concerns you have about testicular cancer.  Use sunscreen. Apply sunscreen liberally and repeatedly throughout the day. You should seek shade when your shadow is shorter than you. Protect yourself by wearing long sleeves, pants, a wide-brimmed hat, and sunglasses year round, whenever you are outdoors.  Once a month, do a whole-body skin exam, using a mirror to  look at the skin on your back. Tell your health care provider about new moles, moles that have irregular borders, moles that are larger than a pencil eraser, or moles that have changed in shape or color.  Stay current with required vaccines (immunizations).  Influenza vaccine. All adults should be immunized every year.  Tetanus, diphtheria, and acellular pertussis (Td, Tdap) vaccine. An adult who has not previously received Tdap or who does not know his vaccine status should receive 1 dose of  Tdap. This initial dose should be followed by tetanus and diphtheria toxoids (Td) booster doses every 10 years. Adults with an unknown or incomplete history of completing a 3-dose immunization series with Td-containing vaccines should begin or complete a primary immunization series including a Tdap dose. Adults should receive a Td booster every 10 years.  Varicella vaccine. An adult without evidence of immunity to varicella should receive 2 doses or a second dose if he has previously received 1 dose.  Human papillomavirus (HPV) vaccine. Males aged 76-21 years who have not received the vaccine previously should receive the 3-dose series. Males aged 22-26 years may be immunized. Immunization is recommended through the age of 61 years for any male who has sex with males and did not get any or all doses earlier. Immunization is recommended for any person with an immunocompromised condition through the age of 86 years if he did not get any or all doses earlier. During the 3-dose series, the second dose should be obtained 4-8 weeks after the first dose. The third dose should be obtained 24 weeks after the first dose and 16 weeks after the second dose.  Zoster vaccine. One dose is recommended for adults aged 55 years or older unless certain conditions are present.    PREVNAR  - Pneumococcal 13-valent conjugate (PCV13) vaccine. When indicated, a person who is uncertain of his immunization history and has no record of  immunization should receive the PCV13 vaccine. An adult aged 50 years or older who has certain medical conditions and has not been previously immunized should receive 1 dose of PCV13 vaccine. This PCV13 should be followed with a dose of pneumococcal polysaccharide (PPSV23) vaccine. The PPSV23 vaccine dose should be obtained at least 8 weeks after the dose of PCV13 vaccine. An adult aged 61 years or older who has certain medical conditions and previously received 1 or more doses of PPSV23 vaccine should receive 1 dose of PCV13. The PCV13 vaccine dose should be obtained 1 or more years after the last PPSV23 vaccine dose.    PNEUMOVAX - Pneumococcal polysaccharide (PPSV23) vaccine. When PCV13 is also indicated, PCV13 should be obtained first. All adults aged 55 years and older should be immunized. An adult younger than age 20 years who has certain medical conditions should be immunized. Any person who resides in a nursing home or long-term care facility should be immunized. An adult smoker should be immunized. People with an immunocompromised condition and certain other conditions should receive both PCV13 and PPSV23 vaccines. People with human immunodeficiency virus (HIV) infection should be immunized as soon as possible after diagnosis. Immunization during chemotherapy or radiation therapy should be avoided. Routine use of PPSV23 vaccine is not recommended for American Indians, [place] Natives, or people younger than 65 years unless there are medical conditions that require PPSV23 vaccine. When indicated, people who have unknown immunization and have no record of immunization should receive PPSV23 vaccine. One-time revaccination 5 years after the first dose of PPSV23 is recommended for people aged 19-64 years who have chronic kidney failure, nephrotic syndrome, asplenia, or immunocompromised conditions. People who received 1-2 doses of PPSV23 before age 34 years should receive another dose of PPSV23 vaccine at age  46 years or later if at least 5 years have passed since the previous dose. Doses of PPSV23 are not needed for people immunized with PPSV23 at or after age 91 years.    Hepatitis A vaccine. Adults who wish to be protected from this disease, have certain high-risk conditions,  work with hepatitis A-infected animals, work in hepatitis A research labs, or travel to or work in countries with a high rate of hepatitis A should be immunized. Adults who were previously unvaccinated and who anticipate close contact with an international adoptee during the first 60 days after arrival in the [place] from a country with a high rate of hepatitis A should be immunized.    Hepatitis B vaccine. Adults should be immunized if they wish to be protected from this disease, have certain high-risk conditions, may be exposed to blood or other infectious body fluids, are household contacts or sex partners of hepatitis B positive people, are clients or workers in certain care facilities, or travel to or work in countries with a high rate of hepatitis B.   Preventive Service / Frequency   Ages 54 to 13  Blood pressure check.  Lipid and cholesterol check  Lung cancer screening. / Every year if you are aged 25-80 years and have a 30-pack-year history of smoking and currently smoke or have quit within the past 15 years. Yearly screening is stopped once you have quit smoking for at least 15 years or develop a health problem that would prevent you from having lung cancer treatment.  Fecal occult blood test (FOBT) of stool. / Every year beginning at age 8 and continuing until age 18. You may not have to do this test if you get a colonoscopy every 10 years.  Flexible sigmoidoscopy** or colonoscopy.** / Every 5 years for a flexible sigmoidoscopy or every 10 years for a colonoscopy beginning at age 30 and continuing until age 10. Screening for abdominal aortic aneurysm (AAA)  by ultrasound is recommended for people who  have history of high blood pressure or who are current or former smokers.

## 2015-07-10 NOTE — Progress Notes (Signed)
Patient ID: Christopher [name], male   DOB: 05-Mar-1956, 59 y.o.   MRN: [id number]  Annual  Screening/Preventative Comprehensive Examination  This very nice 59 y.o. DBM presents for presents for a Wellness/Preventative Visit & comprehensive evaluation and management of multiple medical co-morbidities.  Patient has been followed for HTN, Prediabetes, Hyperlipidemia, Low Testosterone and Vitamin D Deficiency. Patient has gout which is in remission since on Allopurinol.    HTN predates since 2005. Patient's BP has been controlled at home.Today's BP is sl elevated at 142/88. Patient denies any cardiac symptoms as chest pain, palpitations, shortness of breath, dizziness or ankle swelling.   Patient's hyperlipidemia is controlled with diet and medications. Patient denies myalgias or other medication SE's. Last lipids were at goal with  Cholesterol 164; HDL 35*; LDL 100*; Triglycerides 146 on 01/02/2015.   Patient has has sdevere Morbid Obesity (BMI 44.56) and consequent prediabetes since 2010 with A1c 5.9% then 6.1% in 2012 and patient denies reactive hypoglycemic symptoms, visual blurring, diabetic polys or paresthesias. Last A1c was  5.8% on 01/02/2015.     Patient was found low T 228 in 2001 and has been on replacement therapy since. Finally, patient has history of Vitamin D Deficiency of "17" in 2008 and last vitamin D was still relatively low at 41 on 01/02/2015.   Medication Sig  . 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 Albuterol  HFA inhaler Inhale 2 puffs into the lungs every 4 (four) hours as needed for wheezing or shortness of breath.  . allopurinol  100 MG tablet Take 1 tablet (100 mg  total) by mouth daily to  prevent gout  . ALPRAZolam  1 MG tablet Take 1/2 -1 tab tid  . aspirin 81 MG tablet Take 81 mg by mouth daily.    Marland Kitchen buPROPion  XL 300 MG 24 hr tablet Take 1 tablet by mouth  every morning  . VITAMIN B 12 PO Take 1 tablet by mouth daily.    . cyclobenzaprine   10 MG tablet Take 1/2 to 1 tab 3 x day for muscle spasms.  . fenofibrate 160 MG tablet Take 1 tablet (160 mg total) by mouth daily.  . fish oil-omega-3  1000 MG  Take 2 g by mouth daily.    . furosemide (LASIX) 20 MG tablet Take 1 tablet by mouth  daily  .  [facility] 5-325  Take 1 tablet by mouth every 6 (six) hours as needed (Cough). Max: 4 tablets a day  . magnesium oxide (MAG-OX) 400 MG  Take 400 mg by mouth 2 (two) times daily.   . meloxicam (MOBIC) 15 MG tablet Take one daily with food   . montelukast (SINGULAIR) 10 MG tablet Take 1 tablet by mouth at  bedtime  . Multiple Vitamin Take 1 tablet by mouth daily.    Marland Kitchen NEXIUM 40 MG capsule Take 1 capsule by mouth two times daily  . potassium chloride (K-DUR) 10 MEQ tablet Take 1 tablet by mouth 3  times a day  . sucralfate (CARAFATE) 1 G tablet Take 1 g by mouth 4 (four) times daily.    . TESTIM 50 MG/5GM (1%) GEL Place 5 g onto the skin daily.  . valsartan-hctz (DIOVAN-HCT) 160-25  Take 1 tablet by mouth  daily for blood pressure  . vitamin C  500 MG tablet Take 500 mg by mouth 3 (three) times daily.  . Vitamin D 50,000 UNITS CAPS Take 50,000 Units by  mouth daily. Only takes Monday-saturdays  . phentermine (ADIPEX-P) 37.5 MG tablet Take 1/2 to 1 tablet daily for dieting and weight loss   Allergies  Allergen Reactions  . Hyzaar [Losartan Potassium-Hctz]     Pt doesn't like this medicine for his Bp- it didn't work- no allergy to this med   Past Medical History  Diagnosis Date  . Allergy   . GERD (gastroesophageal reflux disease)   . Hyperlipidemia   . Hypertension   . H/O hiatal hernia   . Prediabetes   . Gout   . Vitamin D deficiency   . Barrett's esophagus    Health Maintenance  Topic Date Due  . Hepatitis C Screening  04-04-1956  . HIV Screening  11/07/1972  . COLONOSCOPY  11/08/2007  . INFLUENZA VACCINE  04/01/2016  . TETANUS/TDAP  08/02/2023   Immunization History  Administered Date(s) Administered  . Influenza Split  06/28/2014, 06/28/2015  . Influenza-Unspecified 09/01/2012  . PPD Test 06/28/2014  . Pneumococcal-Unspecified 09/01/1998  . Tdap 08/01/2013   Past Surgical History  Procedure Laterality Date  . Colonoscopy    . Upper gastrointestinal endoscopy      last egd 12-7*2012  . Hemorrhoid surgery  1980   Family History  Problem Relation Age of Onset  . Esophageal cancer Neg Hx   . Rectal cancer Neg Hx   . Stomach cancer Neg Hx   . Colon cancer Maternal Uncle   . Arthritis Mother   . Lung cancer Mother   . Heart disease Mother   . Stroke Brother     Social History   Social History  . Marital Status: Single    Spouse Name: N/A  . Number of Children: 1  . Years of Education: N/A   Occupational History  . law enforcement    Social History Main Topics  . Smoking status: Former Smoker -- 6 years    Types: Cigarettes    Quit date: 02/07/2013  . Smokeless tobacco: Never Used  . Alcohol Use: 0.0 oz/week    0 Standard drinks or equivalent per week     Comment: occassional-been a year since had something to drink.  . Drug Use: No  . Sexual Activity:    Partners: Male   Other Topics Concern  . Not on file   Social History Narrative    ROS Constitutional: Denies fever, chills, weight loss/gain, headaches, insomnia,  night sweats or change in appetite. Does c/o fatigue. Eyes: Denies redness, blurred vision, diplopia, discharge, itchy or watery eyes.  ENT: Denies discharge, congestion, post nasal drip, epistaxis, sore throat, earache, hearing loss, dental pain, Tinnitus, Vertigo, Sinus pain or snoring.  Cardio: Denies chest pain, palpitations, irregular heartbeat, syncope, dyspnea, diaphoresis, orthopnea, PND, claudication or edema Respiratory: denies cough, dyspnea, DOE, pleurisy, hoarseness, laryngitis or wheezing.  Gastrointestinal: Denies dysphagia, heartburn, reflux, water brash, pain, cramps, nausea, vomiting, bloating, diarrhea, constipation, hematemesis, melena,  hematochezia, jaundice or hemorrhoids Genitourinary: Denies dysuria, frequency, urgency, nocturia, hesitancy, discharge, hematuria or flank pain Musculoskeletal: Denies arthralgia, myalgia, stiffness, Jt. Swelling, pain, limp or strain/sprain. Denies Falls. Skin: Denies puritis, rash, hives, warts, acne, eczema or change in skin lesion Neuro: No weakness, tremor, incoordination, spasms, paresthesia or pain Psychiatric: Denies confusion, memory loss or sensory loss. Denies Depression. Endocrine: Denies change in weight, skin, hair change, nocturia, and paresthesia, diabetic polys, visual blurring or hyper / hypo glycemic episodes.  Heme/Lymph: No excessive bleeding, bruising or enlarged lymph nodes.  Physical Exam  BP 142/88 mmHg  Pulse 98  Temp(Src) 98.4 F (36.9 C) (Temporal)  Resp 18  Ht 5' 11.5" (1.816 m)  Wt 324 lb (146.965 kg)  BMI 44.56 kg/m2  General Appearance: Over nourished with central obesity , in no apparent distress. Eyes: PERRLA, EOMs, conjunctiva no swelling or erythema, normal fundi and vessels. Sinuses: No frontal/maxillary tenderness ENT/Mouth: EACs patent / TMs  nl. Nares clear without erythema, swelling, mucoid exudates. Oral hygiene is good. No erythema, swelling, or exudate. Tongue normal, non-obstructing. Tonsils not swollen or erythematous. Hearing normal.  Neck: Supple, thyroid normal. No bruits, nodes or JVD. Respiratory: Respiratory effort normal.  BS equal and clear bilateral without rales, rhonci, wheezing or stridor. Cardio: Heart sounds are normal with regular rate and rhythm and no murmurs, rubs or gallops. Peripheral pulses are normal and equal bilaterally without edema. No aortic or femoral bruits. Chest: symmetric with normal excursions and percussion.  Abdomen: Flat, soft, with bowl sounds. Nontender, no guarding, rebound, hernias, masses, or organomegaly.  Lymphatics: Non tender without lymphadenopathy.  Genitourinary: No hernias.Testes nl. DRE -  prostate nl for age - smooth & firm w/o nodules. Musculoskeletal: Full ROM all peripheral extremities, joint stability, 5/5 strength, and normal gait. Skin: Warm and dry without rashes, lesions, cyanosis, clubbing or  ecchymosis.  Neuro: Cranial nerves intact, reflexes equal bilaterally. Normal muscle tone, no cerebellar symptoms. Sensation intact.  Pysch: Awake and oriented X 3 with normal affect, insight and judgment appropriate.   Assessment and Plan  1. Annual Preventative/Screening Exam   1. Encounter for general adult medical examination with abnormal findings  - Microalbumin / creatinine urine ratio - EKG 12-Lead - [place], RETROPERITNL ABD,  LTD - POC Hemoccult Bld/Stl  - Vitamin B12 - PSA - Iron and TIBC - Urinalysis, Routine w reflex microscopic  - CBC with Differential/Platelet - BASIC METABOLIC PANEL WITH GFR - Hepatic function panel - Magnesium - Lipid panel - TSH - Hemoglobin A1c - Insulin, random - Vit D  25 hydroxy  - Uric acid - Testosterone  2. Essential hypertension  - Microalbumin / creatinine urine ratio - EKG 12-Lead - [place], RETROPERITNL ABD,  LTD - TSH  3. Hyperlipidemia  - Lipid panel  4. Prediabetes  - Hemoglobin A1c - Insulin, random  5. Vitamin D deficiency  - Vit D  25 hydroxy   6. Idiopathic gout  - Uric acid  7. Morbid obesity ([place])   8. Stage 3 CKD (GFR 56 ml/min)   9. Testosterone deficiency  - Testosterone  10. Gastroesophageal reflux disease   11. Screening for rectal cancer  - POC Hemoccult Bld/Stl   12. Prostate cancer screening  - PSA  13. Other fatigue  - Vitamin B12 - Iron and TIBC - TSH  14. Medication management  - Urinalysis, Routine w reflex microscopic  - CBC with Differential/Platelet - BASIC METABOLIC PANEL WITH GFR - Hepatic function panel - Magnesium  15. BMI 44.56, adult ([place])   Continue prudent diet as discussed, weight control, BP monitoring, regular exercise, and medications as  discussed.  Discussed med effects and SE's. Routine screening labs and tests as requested with regular follow-up as recommended.

## 2015-07-11 ENCOUNTER — Other Ambulatory Visit: Payer: Self-pay | Admitting: Internal Medicine

## 2015-07-11 DIAGNOSIS — M1000 Idiopathic gout, unspecified site: Secondary | ICD-10-CM

## 2015-07-11 LAB — URINALYSIS, ROUTINE W REFLEX MICROSCOPIC
Bilirubin Urine: NEGATIVE
Glucose, UA: NEGATIVE
HGB URINE DIPSTICK: NEGATIVE
Ketones, ur: NEGATIVE
LEUKOCYTES UA: NEGATIVE
NITRITE: NEGATIVE
PROTEIN: NEGATIVE
Specific Gravity, Urine: 1.015 (ref 1.001–1.035)
pH: 7 (ref 5.0–8.0)

## 2015-07-11 LAB — TESTOSTERONE: Testosterone: 890 ng/dL (ref 300–890)

## 2015-07-11 LAB — VITAMIN B12: Vitamin B-12: 639 pg/mL (ref 211–911)

## 2015-07-11 LAB — MICROALBUMIN / CREATININE URINE RATIO
Creatinine, Urine: 97 mg/dL (ref 20–370)
MICROALB UR: 0.3 mg/dL
MICROALB/CREAT RATIO: 3 ug/mg{creat} (ref <30)

## 2015-07-11 LAB — VITAMIN D 25 HYDROXY (VIT D DEFICIENCY, FRACTURES): VIT D 25 HYDROXY: 44 ng/mL (ref 30–100)

## 2015-07-11 LAB — PSA: PSA: 0.64 ng/mL (ref <=4.00)

## 2015-07-11 LAB — TSH: TSH: 1.718 u[IU]/mL (ref 0.350–4.500)

## 2015-07-11 LAB — INSULIN, RANDOM: INSULIN: 83.8 u[IU]/mL — AB (ref 2.0–19.6)

## 2015-07-11 MED ORDER — ALLOPURINOL 300 MG PO TABS: ORAL_TABLET | ORAL | Status: DC

## 2015-07-18 ENCOUNTER — Other Ambulatory Visit: Payer: Self-pay | Admitting: Internal Medicine

## 2015-08-17 ENCOUNTER — Other Ambulatory Visit: Payer: Self-pay | Admitting: Internal Medicine

## 2015-08-17 ENCOUNTER — Other Ambulatory Visit: Payer: Self-pay | Admitting: Physician Assistant

## 2015-09-10 ENCOUNTER — Other Ambulatory Visit: Payer: Self-pay | Admitting: *Deleted

## 2015-09-10 DIAGNOSIS — I10 Essential (primary) hypertension: Secondary | ICD-10-CM

## 2015-09-10 MED ORDER — ALPRAZOLAM 1 MG PO TABS: ORAL_TABLET | ORAL | Status: DC

## 2015-09-10 MED ORDER — VITAMIN D (ERGOCALCIFEROL) 1.25 MG (50000 UNIT) PO CAPS: 50000.0000 [IU] | ORAL_CAPSULE | Freq: Every day | ORAL | Status: DC

## 2015-09-10 MED ORDER — TESTOSTERONE 50 MG/5GM (1%) TD GEL: 5.0000 g | Freq: Every day | TRANSDERMAL | Status: DC

## 2015-10-11 ENCOUNTER — Ambulatory Visit (INDEPENDENT_AMBULATORY_CARE_PROVIDER_SITE_OTHER): Payer: 59 | Admitting: Internal Medicine

## 2015-10-11 ENCOUNTER — Encounter: Payer: Self-pay | Admitting: Internal Medicine

## 2015-10-11 VITALS — BP 136/88 | HR 86 | Temp 98.2°F | Resp 18 | Ht 71.25 in | Wt 326.0 lb

## 2015-10-11 DIAGNOSIS — E559 Vitamin D deficiency, unspecified: Secondary | ICD-10-CM

## 2015-10-11 DIAGNOSIS — E785 Hyperlipidemia, unspecified: Secondary | ICD-10-CM

## 2015-10-11 DIAGNOSIS — I10 Essential (primary) hypertension: Secondary | ICD-10-CM

## 2015-10-11 DIAGNOSIS — J069 Acute upper respiratory infection, unspecified: Secondary | ICD-10-CM

## 2015-10-11 DIAGNOSIS — R7303 Prediabetes: Secondary | ICD-10-CM | POA: Diagnosis not present

## 2015-10-11 DIAGNOSIS — Z79899 Other long term (current) drug therapy: Secondary | ICD-10-CM

## 2015-10-11 MED ORDER — PREDNISONE 20 MG PO TABS: ORAL_TABLET | ORAL | Status: DC

## 2015-10-11 MED ORDER — NEXIUM 40 MG PO CPDR: 40.0000 mg | DELAYED_RELEASE_CAPSULE | Freq: Two times a day (BID) | ORAL | Status: DC

## 2015-10-11 MED ORDER — PROMETHAZINE-DM 6.25-15 MG/5ML PO SYRP: ORAL_SOLUTION | ORAL | Status: DC

## 2015-10-11 NOTE — Progress Notes (Signed)
Patient ID: Christopher [name], male   DOB: 1956/03/12, 60 y.o.   MRN: [id number]  Assessment and Plan:  Hypertension:  -Continue medication -monitor blood pressure at home. -Continue DASH diet -Reminder to go to the ER if any CP, SOB, nausea, dizziness, severe HA, changes vision/speech, left arm numbness and tingling and jaw pain.  Cholesterol - Continue diet and exercise   PreDiabetes without complications -Continue diet and exercise.   Vitamin D Def -continue medications.   Acute URI -prednisone -phenergan dm -mucinex -zyrtec  Continue diet and meds as discussed. Further disposition pending results of labs. Discussed med's effects and SE's.    HPI 60 y.o. male  presents for 3 month follow up with hypertension, hyperlipidemia, diabetes and vitamin D deficiency.   His blood pressure has been controlled at home, today their BP is BP: 136/88 mmHg.He does workout. He denies chest pain, shortness of breath, dizziness.  He reports that he has been doing treadmill 30 minutes per day.     He is not on cholesterol medication and denies myalgias. His cholesterol is at goal. The cholesterol was:  07/10/2015: Cholesterol 156; HDL 36*; LDL Cholesterol 77; Triglycerides 216*   He has been working on diet and exercise for prediabetes without complications, he is on bASA, he is not on ACE/ARB, and denies  foot ulcerations, hyperglycemia, hypoglycemia , increased appetite, nausea, paresthesia of the feet, polydipsia, polyuria, visual disturbances, vomiting and weight loss. Last A1C was: 07/10/2015: Hgb A1c MFr Bld 6.1*   Patient is on Vitamin D supplement. 07/10/2015: Vit D, 25-Hydroxy 44   Patient reports that he has been coughing x 2 weeks and having lots of post nasal drainage.  He has a wet cough with green nasal and sputum production.  He has been taking mucinex.  Not helping much.   Current Medications:  Current Outpatient Prescriptions on File Prior to Visit  Medication Sig Dispense Refill   . 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 allopurinol (ZYLOPRIM) 300 MG tablet Take 1 tablet (300 mg) daily to prevent gout 90 tablet 1  . ALPRAZolam (XANAX) 1 MG tablet Take 1/2 -1 tab tid 270 tablet 1  . aspirin 81 MG tablet Take 81 mg by mouth daily.      Marland Kitchen buPROPion (WELLBUTRIN XL) 300 MG 24 hr tablet Take 1 tablet by mouth  every morning 90 tablet 1  . Cyanocobalamin (VITAMIN B 12 PO) Take 1 tablet by mouth daily.      . cyclobenzaprine (FLEXERIL) 10 MG tablet TAKE ONE-HALF TO ONE TABLET BY MOUTH THREE TIMES DAILY FOR MUSCLE SPASMS 90 tablet 3  . fenofibrate 160 MG tablet Take 1 tablet by mouth  daily 90 tablet 1  . fish oil-omega-3 fatty acids 1000 MG capsule Take 2 g by mouth daily.      . furosemide (LASIX) 20 MG tablet Take 1 tablet by mouth  daily 90 tablet 1  . HYDROcodone-acetaminophen (NORCO) 5-325 MG tablet Take 1 tablet by mouth every 6 (six) hours as needed (Cough). Max: 4 tablets a day 30 tablet 0  . magnesium oxide (MAG-OX) 400 MG tablet Take 400 mg by mouth 2 (two) times daily.     . meloxicam (MOBIC) 15 MG tablet Take one daily with food for 2 weeks, can take with tylenol, can not take with aleve, iburpofen, then as needed daily for pain 30 tablet 1  . montelukast (SINGULAIR) 10 MG tablet Take  1 tablet by mouth at  bedtime 90 tablet 1  . Multiple Vitamin (MULTIVITAMIN PO) Take 1 tablet by mouth daily.      Marland Kitchen NEXIUM 40 MG capsule Take 1 capsule by mouth two times daily 180 capsule 1  . potassium chloride (K-DUR) 10 MEQ tablet Take 1 tablet by mouth 3  times a day 270 tablet 1  . sildenafil (REVATIO) 20 MG tablet TAKE 1 TO 5 TABS DAILY AS NEEDED 90 tablet 3  . sucralfate (CARAFATE) 1 G tablet Take 1 tablet by mouth 4  times daily before meals  and at bedtime 360 tablet 1  . testosterone (TESTIM) 50 MG/5GM (1%) GEL Place 5 g onto the skin daily. 90 Tube 1  . valsartan-hydrochlorothiazide (DIOVAN-HCT) 160-25 MG per tablet Take 1  tablet by mouth  daily for blood pressure 90 tablet 1  . vitamin C (ASCORBIC ACID) 500 MG tablet Take 500 mg by mouth 3 (three) times daily.    . Vitamin D, Ergocalciferol, (DRISDOL) 50000 units CAPS capsule Take 1 capsule (50,000 Units total) by mouth daily. 90 capsule 1   No current facility-administered medications on file prior to visit.   Medical History:  Past Medical History  Diagnosis Date  . Allergy   . GERD (gastroesophageal reflux disease)   . Hyperlipidemia   . Hypertension   . H/O hiatal hernia   . Prediabetes   . Gout   . Vitamin D deficiency   . Barrett's esophagus    Allergies:  Allergies  Allergen Reactions  . Hyzaar [Losartan Potassium-Hctz]     Pt doesn't like this medicine for his Bp- it didn't work- no allergy to this med     Review of Systems:  Review of Systems  Constitutional: Negative for fever, chills and malaise/fatigue.  HENT: Positive for congestion and sore throat. Negative for ear pain.   Eyes: Negative.   Respiratory: Positive for cough. Negative for shortness of breath and wheezing.   Cardiovascular: Negative for chest pain, palpitations and leg swelling.  Gastrointestinal: Positive for heartburn. Negative for abdominal pain, diarrhea, constipation, blood in stool and melena.  Genitourinary: Negative.   Skin: Negative.   Neurological: Negative for dizziness, loss of consciousness and headaches.  Psychiatric/Behavioral: Negative for depression. The patient has insomnia. The patient is not nervous/anxious.     Family history- Review and unchanged  Social history- Review and unchanged  Physical Exam: BP 136/88 mmHg  Pulse 86  Temp(Src) 98.2 F (36.8 C) (Temporal)  Resp 18  Ht 5' 11.25" (1.81 m)  Wt 326 lb (147.873 kg)  BMI 45.14 kg/m2 Wt Readings from Last 3 Encounters:  10/11/15 326 lb (147.873 kg)  07/10/15 324 lb (146.965 kg)  03/08/15 322 lb (146.058 kg)   General Appearance: Well nourished well developed, non-toxic  appearing, in no apparent distress. Eyes: PERRLA, EOMs, conjunctiva no swelling or erythema ENT/Mouth: Ear canals clear with no erythema, swelling, or discharge.  TMs normal bilaterally, oropharynx clear, moist, with no exudate.   Neck: Supple, thyroid normal, no JVD, no cervical adenopathy.  Respiratory: Respiratory effort normal, breath sounds clear A&P, no wheeze, rhonchi or rales noted.  No retractions, no accessory muscle usage Cardio: RRR with no MRGs. No noted edema.  Abdomen: Soft, + BS.  Non tender, no guarding, rebound, hernias, masses. Musculoskeletal: Full ROM, 5/5 strength, Normal gait Skin: Warm, dry without rashes, lesions, ecchymosis.  Neuro: Awake and oriented X 3, Cranial nerves intact. No cerebellar symptoms.  Psych: normal affect, Insight and Judgment  appropriate.    Starlyn Skeans, PA-C 11:20 AM [facility]

## 2015-11-09 ENCOUNTER — Other Ambulatory Visit: Payer: Self-pay | Admitting: Internal Medicine

## 2015-12-01 ENCOUNTER — Other Ambulatory Visit: Payer: Self-pay | Admitting: Internal Medicine

## 2016-01-11 ENCOUNTER — Ambulatory Visit (INDEPENDENT_AMBULATORY_CARE_PROVIDER_SITE_OTHER): Payer: 59 | Admitting: Internal Medicine

## 2016-01-11 ENCOUNTER — Encounter: Payer: Self-pay | Admitting: Internal Medicine

## 2016-01-11 VITALS — BP 124/82 | HR 76 | Temp 97.7°F | Resp 16 | Ht 71.5 in | Wt 332.4 lb

## 2016-01-11 DIAGNOSIS — Z79899 Other long term (current) drug therapy: Secondary | ICD-10-CM | POA: Diagnosis not present

## 2016-01-11 DIAGNOSIS — E559 Vitamin D deficiency, unspecified: Secondary | ICD-10-CM

## 2016-01-11 DIAGNOSIS — K219 Gastro-esophageal reflux disease without esophagitis: Secondary | ICD-10-CM | POA: Diagnosis not present

## 2016-01-11 DIAGNOSIS — I10 Essential (primary) hypertension: Secondary | ICD-10-CM

## 2016-01-11 DIAGNOSIS — R7303 Prediabetes: Secondary | ICD-10-CM

## 2016-01-11 DIAGNOSIS — M1000 Idiopathic gout, unspecified site: Secondary | ICD-10-CM | POA: Diagnosis not present

## 2016-01-11 DIAGNOSIS — E291 Testicular hypofunction: Secondary | ICD-10-CM | POA: Diagnosis not present

## 2016-01-11 DIAGNOSIS — E785 Hyperlipidemia, unspecified: Secondary | ICD-10-CM

## 2016-01-11 DIAGNOSIS — E349 Endocrine disorder, unspecified: Secondary | ICD-10-CM

## 2016-01-11 LAB — MAGNESIUM: MAGNESIUM: 1.8 mg/dL (ref 1.5–2.5)

## 2016-01-11 LAB — CBC WITH DIFFERENTIAL/PLATELET
Basophils Absolute: 0 cells/uL (ref 0–200)
Basophils Relative: 0 %
Eosinophils Absolute: 140 cells/uL (ref 15–500)
Eosinophils Relative: 2 %
HEMATOCRIT: 42.9 % (ref 38.5–50.0)
Hemoglobin: 15.1 g/dL (ref 13.2–17.1)
LYMPHS PCT: 35 %
Lymphs Abs: 2450 cells/uL (ref 850–3900)
MCH: 31.7 pg (ref 27.0–33.0)
MCHC: 35.2 g/dL (ref 32.0–36.0)
MCV: 89.9 fL (ref 80.0–100.0)
MONO ABS: 1050 {cells}/uL — AB (ref 200–950)
MONOS PCT: 15 %
MPV: 10.2 fL (ref 7.5–12.5)
NEUTROS PCT: 48 %
Neutro Abs: 3360 cells/uL (ref 1500–7800)
PLATELETS: 136 10*3/uL — AB (ref 140–400)
RBC: 4.77 MIL/uL (ref 4.20–5.80)
RDW: 14.1 % (ref 11.0–15.0)
WBC: 7 10*3/uL (ref 3.8–10.8)

## 2016-01-11 LAB — HEPATIC FUNCTION PANEL
ALBUMIN: 4.2 g/dL (ref 3.6–5.1)
ALK PHOS: 49 U/L (ref 40–115)
ALT: 25 U/L (ref 9–46)
AST: 23 U/L (ref 10–35)
BILIRUBIN INDIRECT: 0.4 mg/dL (ref 0.2–1.2)
BILIRUBIN TOTAL: 0.5 mg/dL (ref 0.2–1.2)
Bilirubin, Direct: 0.1 mg/dL (ref <=0.2)
TOTAL PROTEIN: 6.8 g/dL (ref 6.1–8.1)

## 2016-01-11 LAB — LIPID PANEL
CHOL/HDL RATIO: 4.4 ratio (ref <=5.0)
Cholesterol: 158 mg/dL (ref 125–200)
HDL: 36 mg/dL — ABNORMAL LOW (ref >=40)
LDL CALC: 91 mg/dL (ref <130)
Triglycerides: 153 mg/dL — ABNORMAL HIGH (ref <150)
VLDL: 31 mg/dL — ABNORMAL HIGH (ref <30)

## 2016-01-11 LAB — BASIC METABOLIC PANEL WITH GFR
BUN: 12 mg/dL (ref 7–25)
CALCIUM: 9.2 mg/dL (ref 8.6–10.3)
CO2: 25 mmol/L (ref 20–31)
Chloride: 103 mmol/L (ref 98–110)
Creat: 1 mg/dL (ref 0.70–1.33)
GFR, Est Non African American: 83 mL/min (ref >=60)
GLUCOSE: 103 mg/dL — AB (ref 65–99)
Potassium: 4.1 mmol/L (ref 3.5–5.3)
Sodium: 137 mmol/L (ref 135–146)

## 2016-01-11 LAB — TESTOSTERONE: TESTOSTERONE: 385 ng/dL (ref 250–827)

## 2016-01-11 LAB — TSH: TSH: 1.84 m[IU]/L (ref 0.40–4.50)

## 2016-01-11 LAB — URIC ACID: Uric Acid, Serum: 8.1 mg/dL — ABNORMAL HIGH (ref 4.0–7.8)

## 2016-01-11 NOTE — Patient Instructions (Signed)

## 2016-01-11 NOTE — Progress Notes (Signed)
Patient ID: Christopher [name], male   DOB: 1956-06-28, 60 y.o.   MRN: [id number]  [facility]                       Unk Pinto, M.D.        Uvaldo Bristle. Silverio Lay, P.A.-C       Starlyn Skeans, P.A.-C   [facility]                [address] [place], [place] [id number] Telephone [phone] Telefax [phone] _________________________________________________________________________     This very nice 60 y.o. DBM presents for 6 month follow up with Hypertension, Hyperlipidemia, Pre-Diabetes, Testosterone and Vitamin D Deficiency.      Patient is treated for HTN circa 2005& BP has been controlled at home. Today's BP: 124/82 mmHg. Patient has had no complaints of any cardiac type chest pain, palpitations, dyspnea/orthopnea/PND, dizziness, claudication, or dependent edema.     Hyperlipidemia is controlled with diet & meds. Patient denies myalgias or other med SE's. Last Lipids were at goal with Cholesterol 156; HDL 36*; LDL 77; but elevated Triglycerides 216 on 07/10/2015.     Also, the patient has history of  Morbid Obesity (BMI 45+) and consequent PreDiabetes since 2010 with A1c 5.9% and has had no symptoms of reactive hypoglycemia, diabetic polys, paresthesias or visual blurring.  Last A1c was 6.1% on 07/10/2015.      Patient has hx/o low Testosterone 228 in 2011 and has been on replacement by gel with improved stamina and sense of well being. Further, the patient also has history of Vitamin D Deficiency of "17" in 2008 and supplements vitamin D without any suspected side-effects. Last vitamin D was 44 on 07/10/2015.     Medication Sig  . 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 allopurinol  100 MG  Take 1 tablet by mouth  daily to prevent gout  . ALPRAZolam  1 MG  Take 1/2 -1 tab tid  . aspirin 81 MG Take 81 mg by mouth daily.    . WELLBUTRIN XL 300 MG Take 1 tablet by mouth  every morning   . VITAMIN B 12 tab Take 1 tablet by mouth daily.    . cyclobenzaprine  10 MG TAKE ONE-HALF TO ONE TABLET BY MOUTH THREE TIMES DAILY FOR MUSCLE SPASMS  . fenofibrate 160 MG  Take 1 tablet by mouth  daily  . fish oil-omega 1000 MG Take 2 g by mouth daily.    . furosemide  20 MG  Take 1 tablet by mouth  daily  . NORCO 5-325 MG  Take 1 tablet by mouth every 6 (six) hours as needed (Cough). Max: 4 tablets a day  . magnesium 400 MG  Take 400 mg by mouth 2 (two) times daily.   . montelukast  10 MG  Take 1 tablet by mouth at  bedtime  . Multiple Vitamin  Take 1 tablet by mouth daily.    Marland Kitchen NEXIUM 40 MG  Take 1 capsule (40 mg total) by mouth 2 (two) times daily before a meal.  . potassium chloride 10 MEQ Take 1 tablet by mouth 3  times a day  . sucralfate  1 G  Take 1 tablet by mouth 4  times daily before meals  and at bedtime  .  testosterone (TESTIM) (1%) GEL Place 5 g onto the skin daily.  . valsartan-hctz   160-25 MG  Take 1 tablet by mouth  daily for blood pressure  . vitamin C 500 MG  Take 500 mg by mouth 3 (three) times daily.  . Vitamin D 50,000 units  Take 1 capsule (50,000 Units total) by mouth daily.  Marland Kitchen allopurinol  300 MG  Take 1 tablet (300 mg) daily to prevent gout  . meloxicam  15 MG tablet Take one daily with food   . sildenafil  20 MG tablet TAKE 1 TO 5 TABS DAILY AS NEEDED   Allergies  Allergen Reactions  . Hyzaar [Losartan Potassium-Hctz]     Pt doesn't like this medicine for his Bp- it didn't work- no allergy to this med   PMHx:   Past Medical History  Diagnosis Date  . Allergy   . GERD (gastroesophageal reflux disease)   . Hyperlipidemia   . Hypertension   . H/O hiatal hernia   . Prediabetes   . Gout   . Vitamin D deficiency   . Barrett's esophagus    Immunization History  Administered Date(s) Administered  . Influenza Split 06/28/2014, 06/28/2015  . Influenza-Unspecified 09/01/2012  . PPD Test 06/28/2014  . Pneumococcal-Unspecified 09/01/1998  . Tdap  08/01/2013   Past Surgical History  Procedure Laterality Date  . Colonoscopy    . Upper gastrointestinal endoscopy      last egd 12-7*2012  . Hemorrhoid surgery  1980   FHx:    Reviewed / unchanged  SHx:    Reviewed / unchanged  Systems Review:  Constitutional: Denies fever, chills, wt changes, headaches, insomnia, fatigue, night sweats, change in appetite. Eyes: Denies redness, blurred vision, diplopia, discharge, itchy, watery eyes.  ENT: Denies discharge, congestion, post nasal drip, epistaxis, sore throat, earache, hearing loss, dental pain, tinnitus, vertigo, sinus pain, snoring.  CV: Denies chest pain, palpitations, irregular heartbeat, syncope, dyspnea, diaphoresis, orthopnea, PND, claudication or edema. Respiratory: denies cough, dyspnea, DOE, pleurisy, hoarseness, laryngitis, wheezing.  Gastrointestinal: Denies dysphagia, odynophagia, heartburn, reflux, water brash, abdominal pain or cramps, nausea, vomiting, bloating, diarrhea, constipation, hematemesis, melena, hematochezia  or hemorrhoids. Genitourinary: Denies dysuria, frequency, urgency, nocturia, hesitancy, discharge, hematuria or flank pain. Musculoskeletal: Denies arthralgias, myalgias, stiffness, jt. swelling, pain, limping or strain/sprain.  Skin: Denies pruritus, rash, hives, warts, acne, eczema or change in skin lesion(s). Neuro: No weakness, tremor, incoordination, spasms, paresthesia or pain. Psychiatric: Denies confusion, memory loss or sensory loss. Endo: Denies change in weight, skin or hair change.  Heme/Lymph: No excessive bleeding, bruising or enlarged lymph nodes.  Physical Exam  BP 124/82 mmHg  Pulse 76  Temp(Src) 97.7 F (36.5 C)  Resp 16  Ht 5' 11.5" (1.816 m)  Wt 332 lb 6.4 oz (150.776 kg)  BMI 45.72 kg/m2  Appears well nourished and in no distress. Eyes: PERRLA, EOMs, conjunctiva no swelling or erythema. Sinuses: No frontal/maxillary tenderness ENT/Mouth: EAC's clear, TM's nl w/o erythema,  bulging. Nares clear w/o erythema, swelling, exudates. Oropharynx clear without erythema or exudates. Oral hygiene is good. Tongue normal, non obstructing. Hearing intact.  Neck: Supple. Thyroid nl. Car 2+/2+ without bruits, nodes or JVD. Chest: Respirations nl with BS clear & equal w/o rales, rhonchi, wheezing or stridor.  Cor: Heart sounds normal w/ regular rate and rhythm without sig. murmurs, gallops, clicks, or rubs. Peripheral pulses normal and equal  without edema.  Abdomen: Soft & bowel sounds normal. Non-tender w/o guarding, rebound, hernias, masses, or organomegaly.  Lymphatics:  Unremarkable.  Musculoskeletal: Full ROM all peripheral extremities, joint stability, 5/5 strength, and normal gait.  Skin: Warm, dry without exposed rashes, lesions or ecchymosis apparent.  Neuro: Cranial nerves intact, reflexes equal bilaterally. Sensory-motor testing grossly intact. Tendon reflexes grossly intact.  Pysch: Alert & oriented x 3.  Insight and judgement nl & appropriate. No ideations.  Assessment and Plan:  1. Essential hypertension  - TSH  2. Hyperlipidemia  - Lipid panel - TSH  3. Prediabetes  - Hemoglobin A1c - Insulin, random  4. Vitamin D deficiency  - VITAMIN D 25 Hydroxy  5. Idiopathic gout, unspecified chronicity  - Uric acid  6. Testosterone deficiency  - Testosterone  7. Morbid obesity due to excess calories ([place])   8. Gastroesophageal reflux disease   9. Medication management  - CBC with Differential/Platelet - BASIC METABOLIC PANEL WITH GFR - Hepatic function panel - Magnesium   Recommended regular exercise, BP monitoring, weight control, and discussed med and SE's. Recommended labs to assess and monitor clinical status. Further disposition pending results of labs. Over 30 minutes of exam, counseling, chart review was performed

## 2016-01-12 LAB — VITAMIN D 25 HYDROXY (VIT D DEFICIENCY, FRACTURES): VIT D 25 HYDROXY: 76 ng/mL (ref 30–100)

## 2016-01-12 LAB — HEMOGLOBIN A1C
HEMOGLOBIN A1C: 6 % — AB (ref <5.7)
Mean Plasma Glucose: 126 mg/dL

## 2016-01-12 LAB — INSULIN, RANDOM: Insulin: 24 u[IU]/mL — ABNORMAL HIGH (ref 2.0–19.6)

## 2016-03-11 ENCOUNTER — Other Ambulatory Visit: Payer: Self-pay | Admitting: Physician Assistant

## 2016-03-11 ENCOUNTER — Other Ambulatory Visit: Payer: Self-pay | Admitting: Internal Medicine

## 2016-04-23 ENCOUNTER — Ambulatory Visit (INDEPENDENT_AMBULATORY_CARE_PROVIDER_SITE_OTHER): Payer: 59 | Admitting: Internal Medicine

## 2016-04-23 ENCOUNTER — Encounter: Payer: Self-pay | Admitting: Internal Medicine

## 2016-04-23 VITALS — BP 130/82 | HR 90 | Temp 98.0°F | Resp 18 | Ht 71.5 in

## 2016-04-23 DIAGNOSIS — R7303 Prediabetes: Secondary | ICD-10-CM

## 2016-04-23 DIAGNOSIS — E559 Vitamin D deficiency, unspecified: Secondary | ICD-10-CM

## 2016-04-23 DIAGNOSIS — N183 Chronic kidney disease, stage 3 (moderate): Secondary | ICD-10-CM

## 2016-04-23 DIAGNOSIS — Z79899 Other long term (current) drug therapy: Secondary | ICD-10-CM

## 2016-04-23 DIAGNOSIS — M25561 Pain in right knee: Secondary | ICD-10-CM

## 2016-04-23 DIAGNOSIS — I10 Essential (primary) hypertension: Secondary | ICD-10-CM

## 2016-04-23 DIAGNOSIS — E785 Hyperlipidemia, unspecified: Secondary | ICD-10-CM

## 2016-04-23 DIAGNOSIS — N1831 Chronic kidney disease, stage 3a: Secondary | ICD-10-CM

## 2016-04-23 LAB — BASIC METABOLIC PANEL WITH GFR
BUN: 13 mg/dL (ref 7–25)
CALCIUM: 9.3 mg/dL (ref 8.6–10.3)
CO2: 25 mmol/L (ref 20–31)
CREATININE: 0.99 mg/dL (ref 0.70–1.33)
Chloride: 103 mmol/L (ref 98–110)
GFR, Est Non African American: 84 mL/min (ref >=60)
GLUCOSE: 140 mg/dL — AB (ref 65–99)
Potassium: 3.5 mmol/L (ref 3.5–5.3)
Sodium: 137 mmol/L (ref 135–146)

## 2016-04-23 LAB — CBC WITH DIFFERENTIAL/PLATELET
Basophils Absolute: 0 cells/uL (ref 0–200)
Basophils Relative: 0 %
EOS ABS: 186 {cells}/uL (ref 15–500)
Eosinophils Relative: 3 %
HEMATOCRIT: 40.4 % (ref 38.5–50.0)
Hemoglobin: 13.7 g/dL (ref 13.2–17.1)
LYMPHS PCT: 34 %
Lymphs Abs: 2108 cells/uL (ref 850–3900)
MCH: 30.6 pg (ref 27.0–33.0)
MCHC: 33.9 g/dL (ref 32.0–36.0)
MCV: 90.4 fL (ref 80.0–100.0)
MONO ABS: 868 {cells}/uL (ref 200–950)
MONOS PCT: 14 %
MPV: 10.5 fL (ref 7.5–12.5)
NEUTROS PCT: 49 %
Neutro Abs: 3038 cells/uL (ref 1500–7800)
PLATELETS: 152 10*3/uL (ref 140–400)
RBC: 4.47 MIL/uL (ref 4.20–5.80)
RDW: 13.8 % (ref 11.0–15.0)
WBC: 6.2 10*3/uL (ref 3.8–10.8)

## 2016-04-23 LAB — LIPID PANEL
CHOLESTEROL: 169 mg/dL (ref 125–200)
HDL: 40 mg/dL (ref >=40)
LDL Cholesterol: 94 mg/dL (ref <130)
TRIGLYCERIDES: 174 mg/dL — AB (ref <150)
Total CHOL/HDL Ratio: 4.2 Ratio (ref <=5.0)
VLDL: 35 mg/dL — ABNORMAL HIGH (ref <30)

## 2016-04-23 LAB — HEPATIC FUNCTION PANEL
ALBUMIN: 4.1 g/dL (ref 3.6–5.1)
ALT: 20 U/L (ref 9–46)
AST: 17 U/L (ref 10–35)
Alkaline Phosphatase: 51 U/L (ref 40–115)
Bilirubin, Direct: 0.1 mg/dL (ref <=0.2)
Indirect Bilirubin: 0.4 mg/dL (ref 0.2–1.2)
TOTAL PROTEIN: 6.7 g/dL (ref 6.1–8.1)
Total Bilirubin: 0.5 mg/dL (ref 0.2–1.2)

## 2016-04-23 MED ORDER — CYCLOBENZAPRINE HCL 10 MG PO TABS: ORAL_TABLET | ORAL | 3 refills | Status: DC

## 2016-04-23 NOTE — Progress Notes (Signed)
Assessment and Plan:  Hypertension:  -Continue medication,  -monitor blood pressure at home.  -Continue DASH diet.   -Reminder to go to the ER if any CP, SOB, nausea, dizziness, severe HA, changes vision/speech, left arm numbness and tingling, and jaw pain.  Cholesterol: -Continue diet and exercise.  -Check cholesterol.   Pre-diabetes: -Continue diet and exercise.  -Check A1C  Vitamin D Def: -check level -continue medications.   Right knee pain -improving after arthroscopy -starts PT next week  Morbid obesity -cont diet -exercise when released from ortho/PT  Continue diet and meds as discussed. Further disposition pending results of labs.  HPI 60 y.o. male  presents for 3 month follow up with hypertension, hyperlipidemia, prediabetes and vitamin D.   His blood pressure has been controlled at home, today their BP is BP: 130/82.   He does not workout. He denies chest pain, shortness of breath, dizziness.  He he reports that he has been having issues with his knee pain and then recently had surgery.     He is on cholesterol medication and denies myalgias. His cholesterol is at goal. The cholesterol last visit was:   Lab Results  Component Value Date   CHOL 158 01/11/2016   HDL 36 (L) 01/11/2016   LDLCALC 91 01/11/2016   TRIG 153 (H) 01/11/2016   CHOLHDL 4.4 01/11/2016     He has been working on diet and exercise for prediabetes, and denies foot ulcerations, hyperglycemia, hypoglycemia , increased appetite, nausea, paresthesia of the feet, polydipsia, polyuria, visual disturbances and vomiting. Last A1C in the office was:  Lab Results  Component Value Date   HGBA1C 6.0 (H) 01/11/2016    Patient is on Vitamin D supplement.  Lab Results  Component Value Date   VD25OH 76 01/11/2016     Patient had arthroscopy on his knee last week.  He reports that it is feeling better.  He has no other complaints today.    Current Medications:  Current Outpatient Prescriptions on  File Prior to Visit  Medication Sig Dispense Refill  . 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 allopurinol (ZYLOPRIM) 100 MG tablet Take 1 tablet by mouth  daily to prevent gout 90 tablet 3  . ALPRAZolam (XANAX) 1 MG tablet Take 1/2 -1 tab tid 270 tablet 1  . aspirin 81 MG tablet Take 81 mg by mouth daily.      Marland Kitchen buPROPion (WELLBUTRIN XL) 300 MG 24 hr tablet Take 1 tablet by mouth  every morning 90 tablet 3  . Cyanocobalamin (VITAMIN B 12 PO) Take 1 tablet by mouth daily.      . fenofibrate 160 MG tablet Take 1 tablet by mouth  daily 90 tablet 1  . fish oil-omega-3 fatty acids 1000 MG capsule Take 2 g by mouth daily.      . furosemide (LASIX) 20 MG tablet Take 1 tablet by mouth  daily 90 tablet 1  . magnesium oxide (MAG-OX) 400 MG tablet Take 400 mg by mouth 2 (two) times daily.     . montelukast (SINGULAIR) 10 MG tablet Take 1 tablet by mouth at  bedtime 90 tablet 1  . Multiple Vitamin (MULTIVITAMIN PO) Take 1 tablet by mouth daily.      Marland Kitchen NEXIUM 40 MG capsule TAKE 1 CAPSULE BY MOUTH 2  TIMES DAILY BEFORE MEALS. 180 capsule 1  . potassium chloride (K-DUR) 10 MEQ tablet Take 1 tablet by mouth 3  times a day 270 tablet 1  . sucralfate (CARAFATE) 1 G tablet Take 1 tablet by mouth 4  times daily before meals  and at bedtime 360 tablet 1  . testosterone (TESTIM) 50 MG/5GM (1%) GEL Place 5 g onto the skin daily. 90 Tube 1  . valsartan-hydrochlorothiazide (DIOVAN-HCT) 160-25 MG tablet Take 1 tablet by mouth  daily for blood pressure 90 tablet 1  . vitamin C (ASCORBIC ACID) 500 MG tablet Take 500 mg by mouth 3 (three) times daily.    . Vitamin D, Ergocalciferol, (DRISDOL) 50000 units CAPS capsule Take 1 capsule (50,000 Units total) by mouth daily. 90 capsule 1   No current facility-administered medications on file prior to visit.     Medical History:  Past Medical History:  Diagnosis Date  . Allergy   . Barrett's esophagus   . GERD  (gastroesophageal reflux disease)   . Gout   . H/O hiatal hernia   . Hyperlipidemia   . Hypertension   . Prediabetes   . Vitamin D deficiency     Allergies:  Allergies  Allergen Reactions  . Hyzaar [Losartan Potassium-Hctz]     Pt doesn't like this medicine for his Bp- it didn't work- no allergy to this med     Review of Systems:  Review of Systems  Constitutional: Negative for chills, fever and malaise/fatigue.  HENT: Negative for congestion, ear pain and sore throat.   Eyes: Negative.   Respiratory: Negative for cough, shortness of breath and wheezing.   Cardiovascular: Negative for chest pain, palpitations and leg swelling.  Gastrointestinal: Negative for abdominal pain, blood in stool, constipation, diarrhea, heartburn and melena.  Genitourinary: Negative.   Skin: Negative.   Neurological: Negative for dizziness, sensory change, loss of consciousness and headaches.  Psychiatric/Behavioral: Negative for depression. The patient is not nervous/anxious and does not have insomnia.     Family history- Review and unchanged  Social history- Review and unchanged  Physical Exam: BP 130/82   Pulse 90   Temp 98 F (36.7 C) (Temporal)   Resp 18   Ht 5' 11.5" (1.816 m)  Wt Readings from Last 3 Encounters:  01/11/16 (!) 332 lb 6.4 oz (150.8 kg)  10/11/15 (!) 326 lb (147.9 kg)  07/10/15 (!) 324 lb (147 kg)    General Appearance: Well nourished well developed, in no apparent distress. Eyes: PERRLA, EOMs, conjunctiva no swelling or erythema ENT/Mouth: Ear canals normal without obstruction, swelling, erythma, discharge.  TMs normal bilaterally.  Oropharynx moist, clear, without exudate, or postoropharyngeal swelling. Neck: Supple, thyroid normal,no cervical adenopathy  Respiratory: Respiratory effort normal, Breath sounds clear A&P without rhonchi, wheeze, or rale.  No retractions, no accessory usage. Cardio: RRR with no MRGs. Brisk peripheral pulses without edema.  Abdomen:  Soft, + BS,  Non tender, no guarding, rebound, hernias, masses. Musculoskeletal: Full ROM, 5/5 strength, Normal gait Skin: Warm, dry without rashes, lesions, ecchymosis.  Neuro: Awake and oriented X 3, Cranial nerves intact. Normal muscle tone, no cerebellar symptoms. Psych: Normal affect, Insight and Judgment appropriate.    Starlyn Skeans, PA-C 9:15 AM [facility]

## 2016-04-24 LAB — HEMOGLOBIN A1C
HEMOGLOBIN A1C: 5.7 % — AB (ref <5.7)
Mean Plasma Glucose: 117 mg/dL

## 2016-05-27 ENCOUNTER — Encounter (HOSPITAL_COMMUNITY): Payer: Self-pay | Admitting: Emergency Medicine

## 2016-05-27 ENCOUNTER — Emergency Department (HOSPITAL_COMMUNITY): Payer: 59

## 2016-05-27 DIAGNOSIS — S022XXA Fracture of nasal bones, initial encounter for closed fracture: Secondary | ICD-10-CM | POA: Insufficient documentation

## 2016-05-27 DIAGNOSIS — Z87891 Personal history of nicotine dependence: Secondary | ICD-10-CM | POA: Insufficient documentation

## 2016-05-27 DIAGNOSIS — Y999 Unspecified external cause status: Secondary | ICD-10-CM | POA: Diagnosis not present

## 2016-05-27 DIAGNOSIS — I10 Essential (primary) hypertension: Secondary | ICD-10-CM | POA: Diagnosis not present

## 2016-05-27 DIAGNOSIS — Y92410 Unspecified street and highway as the place of occurrence of the external cause: Secondary | ICD-10-CM | POA: Diagnosis not present

## 2016-05-27 DIAGNOSIS — S0181XA Laceration without foreign body of other part of head, initial encounter: Secondary | ICD-10-CM | POA: Insufficient documentation

## 2016-05-27 DIAGNOSIS — Y939 Activity, unspecified: Secondary | ICD-10-CM | POA: Insufficient documentation

## 2016-05-27 DIAGNOSIS — Z79899 Other long term (current) drug therapy: Secondary | ICD-10-CM | POA: Insufficient documentation
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
## 2016-05-28 ENCOUNTER — Emergency Department (HOSPITAL_COMMUNITY)
Admission: EM | Admit: 2016-05-28 | Discharge: 2016-05-28 | Disposition: A | Discharge status: Home / Self Care | Payer: 59 | Attending: Emergency Medicine | Admitting: Emergency Medicine

## 2016-05-28 DIAGNOSIS — S022XXA Fracture of nasal bones, initial encounter for closed fracture: Secondary | ICD-10-CM | POA: Diagnosis not present

## 2016-05-28 DIAGNOSIS — T1490XA Injury, unspecified, initial encounter: Secondary | ICD-10-CM

## 2016-05-28 DIAGNOSIS — S0181XA Laceration without foreign body of other part of head, initial encounter: Secondary | ICD-10-CM

## 2016-05-28 MED ORDER — LIDOCAINE-EPINEPHRINE (PF) 2 %-1:200000 IJ SOLN
10.0000 mL | Freq: Once | INTRAMUSCULAR | Status: AC
  Administered 2016-05-28: 10 mL
  Filled 2016-05-28: qty 10

## 2016-05-28 MED ORDER — IBUPROFEN 600 MG PO TABS: 600.0000 mg | ORAL_TABLET | Freq: Four times a day (QID) | ORAL | 0 refills | Status: DC | PRN

## 2016-05-28 MED ORDER — ACETAMINOPHEN 500 MG PO TABS
1000.0000 mg | ORAL_TABLET | Freq: Once | ORAL | Status: AC
  Administered 2016-05-28: 1000 mg via ORAL
  Filled 2016-05-28: qty 2

## 2016-05-28 MED ORDER — HYDROCODONE-ACETAMINOPHEN 5-325 MG PO TABS: 1.0000 | ORAL_TABLET | ORAL | 0 refills | Status: DC | PRN

## 2016-05-28 MED ORDER — METHOCARBAMOL 500 MG PO TABS: 500.0000 mg | ORAL_TABLET | Freq: Two times a day (BID) | ORAL | 0 refills | Status: DC
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
## 2016-05-30 ENCOUNTER — Emergency Department (HOSPITAL_COMMUNITY)
Admission: EM | Admit: 2016-05-30 | Discharge: 2016-05-30 | Disposition: A | Discharge status: Home / Self Care | Payer: 59 | Attending: Emergency Medicine | Admitting: Emergency Medicine

## 2016-05-30 ENCOUNTER — Encounter (HOSPITAL_COMMUNITY): Payer: Self-pay | Admitting: Emergency Medicine

## 2016-05-30 DIAGNOSIS — N183 Chronic kidney disease, stage 3 (moderate): Secondary | ICD-10-CM | POA: Insufficient documentation

## 2016-05-30 DIAGNOSIS — Z87891 Personal history of nicotine dependence: Secondary | ICD-10-CM | POA: Diagnosis not present

## 2016-05-30 DIAGNOSIS — S022XXD Fracture of nasal bones, subsequent encounter for fracture with routine healing: Secondary | ICD-10-CM

## 2016-05-30 DIAGNOSIS — R040 Epistaxis: Secondary | ICD-10-CM | POA: Diagnosis present

## 2016-05-30 DIAGNOSIS — I129 Hypertensive chronic kidney disease with stage 1 through stage 4 chronic kidney disease, or unspecified chronic kidney disease: Secondary | ICD-10-CM | POA: Insufficient documentation

## 2016-05-30 MED ORDER — OXYMETAZOLINE HCL 0.05 % NA SOLN
2.0000 | Freq: Two times a day (BID) | NASAL | 0 refills | Status: DC
Start: 2016-05-30 — End: 2016-09-11
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
## 2016-05-30 NOTE — Discharge Instructions (Signed)
Please schedule an appointment with Dr. Marla Roe and take afrin nasal spray as directed.

## 2016-06-02 DIAGNOSIS — S022XXA Fracture of nasal bones, initial encounter for closed fracture: Secondary | ICD-10-CM

## 2016-06-02 HISTORY — DX: Fracture of nasal bones, initial encounter for closed fracture: S02.2XXA

## 2016-06-09 ENCOUNTER — Encounter (HOSPITAL_BASED_OUTPATIENT_CLINIC_OR_DEPARTMENT_OTHER): Payer: Self-pay | Admitting: *Deleted

## 2016-06-09 ENCOUNTER — Encounter (HOSPITAL_BASED_OUTPATIENT_CLINIC_OR_DEPARTMENT_OTHER)
Admission: RE | Admit: 2016-06-09 | Discharge: 2016-06-09 | Disposition: A | Discharge status: Home / Self Care | Payer: 59 | Source: Ambulatory Visit | Attending: Plastic Surgery | Admitting: Plastic Surgery

## 2016-06-09 DIAGNOSIS — S022XXA Fracture of nasal bones, initial encounter for closed fracture: Secondary | ICD-10-CM | POA: Diagnosis present

## 2016-06-09 DIAGNOSIS — E785 Hyperlipidemia, unspecified: Secondary | ICD-10-CM | POA: Diagnosis not present

## 2016-06-09 DIAGNOSIS — Z87891 Personal history of nicotine dependence: Secondary | ICD-10-CM | POA: Diagnosis not present

## 2016-06-09 DIAGNOSIS — I10 Essential (primary) hypertension: Secondary | ICD-10-CM | POA: Diagnosis not present

## 2016-06-09 DIAGNOSIS — R7303 Prediabetes: Secondary | ICD-10-CM | POA: Diagnosis not present

## 2016-06-09 DIAGNOSIS — K219 Gastro-esophageal reflux disease without esophagitis: Secondary | ICD-10-CM | POA: Diagnosis not present

## 2016-06-09 LAB — BASIC METABOLIC PANEL
Anion gap: 9 (ref 5–15)
BUN: 10 mg/dL (ref 6–20)
CHLORIDE: 101 mmol/L (ref 101–111)
CO2: 28 mmol/L (ref 22–32)
CREATININE: 1.1 mg/dL (ref 0.61–1.24)
Calcium: 9.9 mg/dL (ref 8.9–10.3)
Glucose, Bld: 105 mg/dL — ABNORMAL HIGH (ref 65–99)
POTASSIUM: 4.1 mmol/L (ref 3.5–5.1)
SODIUM: 138 mmol/L (ref 135–145)

## 2016-06-09 NOTE — Progress Notes (Signed)
Pt may come this am for Anesthesia consult due to BMI 44 and get BMET. If not will have to be done day of surgery.

## 2016-06-09 NOTE — Progress Notes (Signed)
Pt arrived for blood work, weight check, and anesthesia consult. Weight=339. Dr Barbarann Ehlers met with patient and cleared for surgery tomorrow.

## 2016-06-10 ENCOUNTER — Encounter (HOSPITAL_BASED_OUTPATIENT_CLINIC_OR_DEPARTMENT_OTHER)
Admission: RE | Disposition: A | Discharge status: Home / Self Care | Payer: Self-pay | Source: Ambulatory Visit | Attending: Plastic Surgery

## 2016-06-10 ENCOUNTER — Ambulatory Visit: Payer: Self-pay | Admitting: Plastic Surgery

## 2016-06-10 ENCOUNTER — Ambulatory Visit (HOSPITAL_BASED_OUTPATIENT_CLINIC_OR_DEPARTMENT_OTHER): Payer: 59 | Admitting: Certified Registered"

## 2016-06-10 ENCOUNTER — Ambulatory Visit (HOSPITAL_BASED_OUTPATIENT_CLINIC_OR_DEPARTMENT_OTHER)
Admission: RE | Admit: 2016-06-10 | Discharge: 2016-06-10 | Disposition: A | Discharge status: Home / Self Care | Payer: 59 | Source: Ambulatory Visit | Attending: Plastic Surgery | Admitting: Plastic Surgery

## 2016-06-10 ENCOUNTER — Encounter (HOSPITAL_BASED_OUTPATIENT_CLINIC_OR_DEPARTMENT_OTHER): Payer: Self-pay | Admitting: *Deleted

## 2016-06-10 DIAGNOSIS — S022XXA Fracture of nasal bones, initial encounter for closed fracture: Secondary | ICD-10-CM | POA: Insufficient documentation

## 2016-06-10 DIAGNOSIS — E785 Hyperlipidemia, unspecified: Secondary | ICD-10-CM | POA: Insufficient documentation

## 2016-06-10 DIAGNOSIS — I10 Essential (primary) hypertension: Secondary | ICD-10-CM | POA: Insufficient documentation

## 2016-06-10 DIAGNOSIS — Z87891 Personal history of nicotine dependence: Secondary | ICD-10-CM | POA: Insufficient documentation

## 2016-06-10 DIAGNOSIS — R7303 Prediabetes: Secondary | ICD-10-CM | POA: Insufficient documentation

## 2016-06-10 DIAGNOSIS — K219 Gastro-esophageal reflux disease without esophagitis: Secondary | ICD-10-CM | POA: Insufficient documentation

## 2016-06-10 HISTORY — DX: Anxiety disorder, unspecified: F41.9

## 2016-06-10 HISTORY — PX: CLOSED REDUCTION NASAL FRACTURE: SHX5365

## 2016-06-10 SURGERY — CLOSED REDUCTION, FRACTURE, NASAL BONE
Anesthesia: General | Site: Nose

## 2016-06-10 MED ORDER — PROMETHAZINE HCL 25 MG/ML IJ SOLN: 6.2500 mg | INTRAMUSCULAR | Status: DC | PRN

## 2016-06-10 MED ORDER — SODIUM CHLORIDE 0.9 % IJ SOLN
INTRAMUSCULAR | Status: AC
  Filled 2016-06-10: qty 10

## 2016-06-10 MED ORDER — PROPOFOL 10 MG/ML IV BOLUS
INTRAVENOUS | Status: DC | PRN
  Administered 2016-06-10: 250 mg via INTRAVENOUS

## 2016-06-10 MED ORDER — BACITRACIN-NEOMYCIN-POLYMYXIN 400-5-5000 EX OINT
TOPICAL_OINTMENT | CUTANEOUS | Status: DC | PRN
  Administered 2016-06-10: 1 via TOPICAL

## 2016-06-10 MED ORDER — SCOPOLAMINE 1 MG/3DAYS TD PT72: 1.0000 | MEDICATED_PATCH | Freq: Once | TRANSDERMAL | Status: DC | PRN

## 2016-06-10 MED ORDER — CEFAZOLIN SODIUM-DEXTROSE 2-4 GM/100ML-% IV SOLN
INTRAVENOUS | Status: AC
  Filled 2016-06-10: qty 100

## 2016-06-10 MED ORDER — HYDRALAZINE HCL 20 MG/ML IJ SOLN
5.0000 mg | Freq: Once | INTRAMUSCULAR | Status: AC
  Administered 2016-06-10: 5 mg via INTRAVENOUS

## 2016-06-10 MED ORDER — FENTANYL CITRATE (PF) 100 MCG/2ML IJ SOLN
INTRAMUSCULAR | Status: AC
  Filled 2016-06-10: qty 2

## 2016-06-10 MED ORDER — SUCCINYLCHOLINE CHLORIDE 200 MG/10ML IV SOSY
PREFILLED_SYRINGE | INTRAVENOUS | Status: AC
  Filled 2016-06-10: qty 10

## 2016-06-10 MED ORDER — LIDOCAINE-EPINEPHRINE (PF) 1 %-1:200000 IJ SOLN
INTRAMUSCULAR | Status: AC
  Filled 2016-06-10: qty 30

## 2016-06-10 MED ORDER — HYDRALAZINE HCL 20 MG/ML IJ SOLN
INTRAMUSCULAR | Status: AC
  Filled 2016-06-10: qty 1

## 2016-06-10 MED ORDER — DEXAMETHASONE SODIUM PHOSPHATE 4 MG/ML IJ SOLN
INTRAMUSCULAR | Status: DC | PRN
  Administered 2016-06-10: 10 mg via INTRAVENOUS

## 2016-06-10 MED ORDER — METHYLENE BLUE 0.5 % INJ SOLN
INTRAVENOUS | Status: AC
  Filled 2016-06-10: qty 10

## 2016-06-10 MED ORDER — LACTATED RINGERS IV SOLN
INTRAVENOUS | Status: DC
  Administered 2016-06-10: 07:00:00 via INTRAVENOUS
  Administered 2016-06-10: 10 mL/h via INTRAVENOUS

## 2016-06-10 MED ORDER — HYDROCODONE-ACETAMINOPHEN 7.5-325 MG PO TABS: 1.0000 | ORAL_TABLET | Freq: Once | ORAL | Status: DC | PRN

## 2016-06-10 MED ORDER — GLYCOPYRROLATE 0.2 MG/ML IJ SOLN: 0.2000 mg | Freq: Once | INTRAMUSCULAR | Status: DC | PRN

## 2016-06-10 MED ORDER — ONDANSETRON HCL 4 MG/2ML IJ SOLN
INTRAMUSCULAR | Status: AC
  Filled 2016-06-10: qty 2

## 2016-06-10 MED ORDER — BACITRACIN-NEOMYCIN-POLYMYXIN 400-5-5000 EX OINT
TOPICAL_OINTMENT | CUTANEOUS | Status: AC
  Filled 2016-06-10: qty 1

## 2016-06-10 MED ORDER — LIDOCAINE HCL (CARDIAC) 20 MG/ML IV SOLN
INTRAVENOUS | Status: DC | PRN
  Administered 2016-06-10: 80 mg via INTRAVENOUS

## 2016-06-10 MED ORDER — ONDANSETRON HCL 4 MG/2ML IJ SOLN
INTRAMUSCULAR | Status: DC | PRN
  Administered 2016-06-10: 4 mg via INTRAVENOUS

## 2016-06-10 MED ORDER — MIDAZOLAM HCL 2 MG/2ML IJ SOLN
1.0000 mg | INTRAMUSCULAR | Status: DC | PRN
  Administered 2016-06-10: 2 mg via INTRAVENOUS

## 2016-06-10 MED ORDER — DEXAMETHASONE SODIUM PHOSPHATE 10 MG/ML IJ SOLN
INTRAMUSCULAR | Status: AC
  Filled 2016-06-10: qty 1

## 2016-06-10 MED ORDER — FENTANYL CITRATE (PF) 100 MCG/2ML IJ SOLN
50.0000 ug | INTRAMUSCULAR | Status: AC | PRN
  Administered 2016-06-10: 50 ug via INTRAVENOUS
  Administered 2016-06-10 (×2): 25 ug via INTRAVENOUS

## 2016-06-10 MED ORDER — CEFAZOLIN IN D5W 1 GM/50ML IV SOLN
INTRAVENOUS | Status: AC
  Filled 2016-06-10: qty 50

## 2016-06-10 MED ORDER — HYDROCODONE-ACETAMINOPHEN 5-325 MG PO TABS: 1.0000 | ORAL_TABLET | Freq: Four times a day (QID) | ORAL | 0 refills | Status: DC | PRN

## 2016-06-10 MED ORDER — MIDAZOLAM HCL 2 MG/2ML IJ SOLN
INTRAMUSCULAR | Status: AC
  Filled 2016-06-10: qty 2

## 2016-06-10 MED ORDER — OXYMETAZOLINE HCL 0.05 % NA SOLN
NASAL | Status: DC | PRN
  Administered 2016-06-10: 1 via TOPICAL

## 2016-06-10 MED ORDER — DEXTROSE 5 % IV SOLN
INTRAVENOUS | Status: DC | PRN
  Administered 2016-06-10: 3 g via INTRAVENOUS

## 2016-06-10 MED ORDER — FENTANYL CITRATE (PF) 100 MCG/2ML IJ SOLN
25.0000 ug | INTRAMUSCULAR | Status: DC | PRN
  Administered 2016-06-10 (×2): 25 ug via INTRAVENOUS
  Administered 2016-06-10: 50 ug via INTRAVENOUS

## 2016-06-10 MED ORDER — PROPOFOL 10 MG/ML IV BOLUS
INTRAVENOUS | Status: AC
  Filled 2016-06-10: qty 40

## 2016-06-10 MED ORDER — HYDROCODONE-ACETAMINOPHEN 5-325 MG PO TABS
1.0000 | ORAL_TABLET | Freq: Four times a day (QID) | ORAL | Status: DC | PRN
  Administered 2016-06-10: 1 via ORAL

## 2016-06-10 MED ORDER — HYDROCODONE-ACETAMINOPHEN 5-325 MG PO TABS
ORAL_TABLET | ORAL | Status: AC
  Filled 2016-06-10: qty 1

## 2016-06-10 MED ORDER — LIDOCAINE 2% (20 MG/ML) 5 ML SYRINGE
INTRAMUSCULAR | Status: AC
  Filled 2016-06-10: qty 5

## 2016-06-10 SURGICAL SUPPLY — 42 items
APPLICATOR COTTON TIP 6IN STRL (MISCELLANEOUS) ×2 IMPLANT
BENZOIN TINCTURE PRP APPL 2/3 (GAUZE/BANDAGES/DRESSINGS) IMPLANT
BLADE SURG 15 STRL LF DISP TIS (BLADE) IMPLANT
BLADE SURG 15 STRL SS (BLADE)
CANISTER SUCT 1200ML W/VALVE (MISCELLANEOUS) ×2 IMPLANT
COVER BACK TABLE 60X90IN (DRAPES) ×2 IMPLANT
COVER MAYO STAND STRL (DRAPES) ×2 IMPLANT
DECANTER SPIKE VIAL GLASS SM (MISCELLANEOUS) IMPLANT
DERMABOND ADVANCED (GAUZE/BANDAGES/DRESSINGS)
DERMABOND ADVANCED .7 DNX12 (GAUZE/BANDAGES/DRESSINGS) IMPLANT
DRSG NASOPORE 8CM (GAUZE/BANDAGES/DRESSINGS) IMPLANT
ELECT NEEDLE BLADE 2-5/6 (NEEDLE) IMPLANT
ELECT REM PT RETURN 9FT ADLT (ELECTROSURGICAL)
ELECTRODE REM PT RTRN 9FT ADLT (ELECTROSURGICAL) IMPLANT
GAUZE VASELINE FOILPK 1/2 X 72 (GAUZE/BANDAGES/DRESSINGS) IMPLANT
GLOVE BIO SURGEON STRL SZ 6.5 (GLOVE) ×2 IMPLANT
GLOVE SURG SS PI 7.0 STRL IVOR (GLOVE) ×2 IMPLANT
GOWN STRL REUS W/ TWL LRG LVL3 (GOWN DISPOSABLE) ×2 IMPLANT
GOWN STRL REUS W/TWL LRG LVL3 (GOWN DISPOSABLE) ×2
KIT SPLINT NASAL DENVER LRG BE (GAUZE/BANDAGES/DRESSINGS) IMPLANT
KIT SPLINT NASAL DENVER MIN BE (GAUZE/BANDAGES/DRESSINGS) IMPLANT
KIT SPLINT NASAL DENVER PET BE (GAUZE/BANDAGES/DRESSINGS) IMPLANT
KIT SPLINT NASAL DENVER SM BEI (GAUZE/BANDAGES/DRESSINGS) ×2 IMPLANT
NEEDLE PRECISIONGLIDE 27X1.5 (NEEDLE) IMPLANT
PACK BASIN DAY SURGERY FS (CUSTOM PROCEDURE TRAY) ×2 IMPLANT
PATTIES SURGICAL .5 X3 (DISPOSABLE) ×2 IMPLANT
PENCIL BUTTON HOLSTER BLD 10FT (ELECTRODE) IMPLANT
SHEET MEDIUM DRAPE 40X70 STRL (DRAPES) ×2 IMPLANT
SPLINT NASAL AIRWAY SILICONE (MISCELLANEOUS) ×2 IMPLANT
SPLINT NASAL THERMO PLAST (MISCELLANEOUS) IMPLANT
SPONGE GAUZE 2X2 8PLY STRL LF (GAUZE/BANDAGES/DRESSINGS) IMPLANT
STRIP CLOSURE SKIN 1/2X4 (GAUZE/BANDAGES/DRESSINGS) IMPLANT
SUT CHROMIC 6 0 PS 4 (SUTURE) IMPLANT
SUT ETHILON 3 0 PS 1 (SUTURE) ×2 IMPLANT
SUT SILK 2 0 FS (SUTURE) IMPLANT
SUT SILK 3 0 SH 30 (SUTURE) IMPLANT
SYR CONTROL 10ML LL (SYRINGE) IMPLANT
TOWEL OR 17X24 6PK STRL BLUE (TOWEL DISPOSABLE) ×2 IMPLANT
TRAY DSU PREP LF (CUSTOM PROCEDURE TRAY) ×2 IMPLANT
TUBE CONNECTING 20X1/4 (TUBING) ×2 IMPLANT
TUBE SALEM SUMP 16 FR W/ARV (TUBING) ×2 IMPLANT
YANKAUER SUCT BULB TIP NO VENT (SUCTIONS) IMPLANT
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
## 2016-06-10 NOTE — Discharge Instructions (Signed)
No heavy lifting Do not remove splint Keep splint dry    Post Anesthesia Home Care Instructions  Activity: Get plenty of rest for the remainder of the day. A responsible adult should stay with you for 24 hours following the procedure.  For the next 24 hours, DO NOT: -Drive a car -Paediatric nurse -Drink alcoholic beverages -Take any medication unless instructed by your physician -Make any legal decisions or sign important papers.  Meals: Start with liquid foods such as gelatin or soup. Progress to regular foods as tolerated. Avoid greasy, spicy, heavy foods. If nausea and/or vomiting occur, drink only clear liquids until the nausea and/or vomiting subsides. Call your physician if vomiting continues.  Special Instructions/Symptoms: Your throat may feel dry or sore from the anesthesia or the breathing tube placed in your throat during surgery. If this causes discomfort, gargle with warm salt water. The discomfort should disappear within 24 hours.  If you had a scopolamine patch placed behind your ear for the management of post- operative nausea and/or vomiting:  1. The medication in the patch is effective for 72 hours, after which it should be removed.  Wrap patch in a tissue and discard in the trash. Wash hands thoroughly with soap and water. 2. You may remove the patch earlier than 72 hours if you experience unpleasant side effects which may include dry mouth, dizziness or visual disturbances. 3. Avoid touching the patch. Wash your hands with soap and water after contact with the patch.

## 2016-06-10 NOTE — Anesthesia Preprocedure Evaluation (Addendum)
Anesthesia Evaluation  Patient identified by MRN, date of birth, ID band Patient awake    Reviewed: Allergy & Precautions, NPO status , Patient's Chart, lab work & pertinent test results  Airway Mallampati: II  TM Distance: >3 FB Neck ROM: Full    Dental  (+) Dental Advisory Given   Pulmonary former smoker,    breath sounds clear to auscultation       Cardiovascular hypertension, Pt. on medications  Rhythm:Regular Rate:Normal     Neuro/Psych Anxiety negative neurological ROS     GI/Hepatic Neg liver ROS, hiatal hernia, GERD  ,  Endo/Other  negative endocrine ROS  Renal/GU Renal disease     Musculoskeletal   Abdominal   Peds  Hematology negative hematology ROS (+)   Anesthesia Other Findings   Reproductive/Obstetrics                            Anesthesia Physical Anesthesia Plan  ASA: III  Anesthesia Plan: General   Post-op Pain Management:    Induction: Intravenous  Airway Management Planned: Mask, Natural Airway, Oral ETT and LMA  Additional Equipment:   Intra-op Plan:   Post-operative Plan: Extubation in OR  Informed Consent: I have reviewed the patients History and Physical, chart, labs and discussed the procedure including the risks, benefits and alternatives for the proposed anesthesia with the patient or authorized representative who has indicated his/her understanding and acceptance.   Dental advisory given  Plan Discussed with: CRNA  Anesthesia Plan Comments:         Anesthesia Quick Evaluation

## 2016-06-10 NOTE — Transfer of Care (Signed)
Immediate Anesthesia Transfer of Care Note  Patient: Christopher [name]  Procedure(s) Performed: Procedure(s) (LRB): CLOSED REDUCTION NASAL FRACTURE (N/A)  Patient Location: PACU  Anesthesia Type: General  Level of Consciousness: awake, oriented, sedated and patient cooperative  Airway & Oxygen Therapy: Patient Spontanous Breathing and Patient connected to face mask oxygen  Post-op Assessment: Report given to PACU RN and Post -op Vital signs reviewed and stable  Post vital signs: Reviewed and stable  Complications: No apparent anesthesia complications

## 2016-06-10 NOTE — Anesthesia Procedure Notes (Signed)
Procedure Name: LMA Insertion Date/Time: 06/10/2016 7:37 AM Performed by: Denna Haggard D Pre-anesthesia Checklist: Patient identified, Emergency Drugs available, Suction available and Patient being monitored Patient Re-evaluated:Patient Re-evaluated prior to inductionOxygen Delivery Method: Circle system utilized Preoxygenation: Pre-oxygenation with 100% oxygen Intubation Type: IV induction Ventilation: Mask ventilation without difficulty LMA: LMA with gastric port inserted LMA Size: 5.0 Number of attempts: 1 Airway Equipment and Method: Bite block Placement Confirmation: positive ETCO2 Tube secured with: Tape Dental Injury: Teeth and Oropharynx as per pre-operative assessment

## 2016-06-10 NOTE — Anesthesia Postprocedure Evaluation (Signed)
Anesthesia Post Note  Patient: Christopher [name]  Procedure(s) Performed: Procedure(s) (LRB): CLOSED REDUCTION NASAL FRACTURE FOREHEAD SUTURE REMOVAL (N/A)  Patient location during evaluation: PACU Anesthesia Type: General Level of consciousness: awake and alert Pain management: pain level controlled Vital Signs Assessment: post-procedure vital signs reviewed and stable Respiratory status: spontaneous breathing, nonlabored ventilation, respiratory function stable and patient connected to nasal cannula oxygen Cardiovascular status: blood pressure returned to baseline and stable Postop Assessment: no signs of nausea or vomiting Anesthetic complications: no    Last Vitals:  Vitals:   06/10/16 0945 06/10/16 1002  BP: (!) 128/94 (!) 133/98  Pulse: 80 73  Resp: 18 18  Temp:  36.9 C    Last Pain:  Vitals:   06/10/16 1002  TempSrc: Oral  PainSc: 4                  Tiajuana Amass

## 2016-06-10 NOTE — Brief Op Note (Signed)
06/10/2016  7:59 AM  PATIENT:  Christopher [name]  60 y.o. male  PRE-OPERATIVE DIAGNOSIS:  nasal fracture  POST-OPERATIVE DIAGNOSIS:  nasal fracture  PROCEDURE:  Procedure(s): CLOSED REDUCTION NASAL FRACTURE (N/A)  SURGEON:  Surgeon(s) and Role:    * Diarra Kos S Ardyn Forge, DO - Primary  PHYSICIAN ASSISTANT: none  ASSISTANTS: none   ANESTHESIA:   general  EBL:  Total I/O In: 200 [I.V.:200] Out: 5 [Blood:5]  BLOOD ADMINISTERED:none  DRAINS: none   LOCAL MEDICATIONS USED:  NONE  SPECIMEN:  No Specimen  DISPOSITION OF SPECIMEN:  N/A  COUNTS:  YES  TOURNIQUET:  * No tourniquets in log *  DICTATION: .Dragon Dictation  PLAN OF CARE: Discharge to home after PACU  PATIENT DISPOSITION:  PACU - hemodynamically stable.   Delay start of Pharmacological VTE agent (>24hrs) due to surgical blood loss or risk of bleeding: no

## 2016-06-10 NOTE — Interval H&P Note (Signed)
History and Physical Interval Note:  06/10/2016 7:20 AM  Christopher [name]  has presented today for surgery, with the diagnosis of nasal fracture  The various methods of treatment have been discussed with the patient and family. After consideration of risks, benefits and other options for treatment, the patient has consented to  Procedure(s): CLOSED REDUCTION NASAL FRACTURE (N/A) as a surgical intervention .  The patient's history has been reviewed, patient examined, no change in status, stable for surgery.  I have reviewed the patient's chart and labs.  Questions were answered to the patient's satisfaction.     [name]
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
## 2016-06-12 ENCOUNTER — Encounter (HOSPITAL_BASED_OUTPATIENT_CLINIC_OR_DEPARTMENT_OTHER): Payer: Self-pay | Admitting: Plastic Surgery

## 2016-06-27 ENCOUNTER — Other Ambulatory Visit: Payer: Self-pay | Admitting: Internal Medicine

## 2016-08-11 ENCOUNTER — Other Ambulatory Visit: Payer: Self-pay | Admitting: *Deleted

## 2016-08-11 ENCOUNTER — Encounter: Payer: Self-pay | Admitting: Internal Medicine

## 2016-08-11 ENCOUNTER — Ambulatory Visit (INDEPENDENT_AMBULATORY_CARE_PROVIDER_SITE_OTHER): Payer: 59 | Admitting: Internal Medicine

## 2016-08-11 VITALS — BP 146/86 | HR 80 | Temp 97.5°F | Resp 16 | Ht 71.5 in | Wt 302.6 lb

## 2016-08-11 DIAGNOSIS — Z136 Encounter for screening for cardiovascular disorders: Secondary | ICD-10-CM | POA: Diagnosis not present

## 2016-08-11 DIAGNOSIS — R5383 Other fatigue: Secondary | ICD-10-CM

## 2016-08-11 DIAGNOSIS — Z0001 Encounter for general adult medical examination with abnormal findings: Secondary | ICD-10-CM

## 2016-08-11 DIAGNOSIS — Z23 Encounter for immunization: Secondary | ICD-10-CM | POA: Diagnosis not present

## 2016-08-11 DIAGNOSIS — Z79899 Other long term (current) drug therapy: Secondary | ICD-10-CM

## 2016-08-11 DIAGNOSIS — Z0000 Encounter for general adult medical examination without abnormal findings: Secondary | ICD-10-CM | POA: Diagnosis not present

## 2016-08-11 DIAGNOSIS — N1831 Chronic kidney disease, stage 3a: Secondary | ICD-10-CM

## 2016-08-11 DIAGNOSIS — K219 Gastro-esophageal reflux disease without esophagitis: Secondary | ICD-10-CM

## 2016-08-11 DIAGNOSIS — M1000 Idiopathic gout, unspecified site: Secondary | ICD-10-CM

## 2016-08-11 DIAGNOSIS — E349 Endocrine disorder, unspecified: Secondary | ICD-10-CM

## 2016-08-11 DIAGNOSIS — Z111 Encounter for screening for respiratory tuberculosis: Secondary | ICD-10-CM | POA: Diagnosis not present

## 2016-08-11 DIAGNOSIS — E782 Mixed hyperlipidemia: Secondary | ICD-10-CM

## 2016-08-11 DIAGNOSIS — E559 Vitamin D deficiency, unspecified: Secondary | ICD-10-CM

## 2016-08-11 DIAGNOSIS — Z1212 Encounter for screening for malignant neoplasm of rectum: Secondary | ICD-10-CM

## 2016-08-11 DIAGNOSIS — R7303 Prediabetes: Secondary | ICD-10-CM

## 2016-08-11 DIAGNOSIS — Z125 Encounter for screening for malignant neoplasm of prostate: Secondary | ICD-10-CM

## 2016-08-11 DIAGNOSIS — I10 Essential (primary) hypertension: Secondary | ICD-10-CM

## 2016-08-11 DIAGNOSIS — N183 Chronic kidney disease, stage 3 (moderate): Secondary | ICD-10-CM

## 2016-08-11 LAB — CBC WITH DIFFERENTIAL/PLATELET
BASOS ABS: 61 {cells}/uL (ref 0–200)
Basophils Relative: 1 %
EOS PCT: 2 %
Eosinophils Absolute: 122 cells/uL (ref 15–500)
HCT: 45.5 % (ref 38.5–50.0)
Hemoglobin: 15.3 g/dL (ref 13.2–17.1)
LYMPHS PCT: 33 %
Lymphs Abs: 2013 cells/uL (ref 850–3900)
MCH: 31.2 pg (ref 27.0–33.0)
MCHC: 33.6 g/dL (ref 32.0–36.0)
MCV: 92.7 fL (ref 80.0–100.0)
MONOS PCT: 15 %
MPV: 10.1 fL (ref 7.5–12.5)
Monocytes Absolute: 915 cells/uL (ref 200–950)
NEUTROS PCT: 49 %
Neutro Abs: 2989 cells/uL (ref 1500–7800)
PLATELETS: 155 10*3/uL (ref 140–400)
RBC: 4.91 MIL/uL (ref 4.20–5.80)
RDW: 14.3 % (ref 11.0–15.0)
WBC: 6.1 10*3/uL (ref 3.8–10.8)

## 2016-08-11 LAB — VITAMIN B12: Vitamin B-12: 556 pg/mL (ref 200–1100)

## 2016-08-11 LAB — PSA: PSA: 1.9 ng/mL (ref <=4.0)

## 2016-08-11 LAB — TSH: TSH: 2.04 mIU/L (ref 0.40–4.50)

## 2016-08-11 MED ORDER — VALSARTAN-HYDROCHLOROTHIAZIDE 160-25 MG PO TABS: 1.0000 | ORAL_TABLET | Freq: Every day | ORAL | 1 refills | Status: DC

## 2016-08-11 MED ORDER — FUROSEMIDE 20 MG PO TABS: 20.0000 mg | ORAL_TABLET | Freq: Every day | ORAL | 1 refills | Status: DC

## 2016-08-11 MED ORDER — ALPRAZOLAM 1 MG PO TABS: ORAL_TABLET | ORAL | 1 refills | Status: DC

## 2016-08-11 MED ORDER — METHOCARBAMOL 500 MG PO TABS: ORAL_TABLET | ORAL | 1 refills | Status: DC

## 2016-08-11 MED ORDER — VITAMIN D (ERGOCALCIFEROL) 1.25 MG (50000 UNIT) PO CAPS: 50000.0000 [IU] | ORAL_CAPSULE | Freq: Every day | ORAL | 1 refills | Status: DC

## 2016-08-11 MED ORDER — NEXIUM 40 MG PO CPDR: DELAYED_RELEASE_CAPSULE | ORAL | 1 refills | Status: DC

## 2016-08-11 MED ORDER — ALLOPURINOL 100 MG PO TABS: ORAL_TABLET | ORAL | 3 refills | Status: DC

## 2016-08-11 MED ORDER — TESTOSTERONE 50 MG/5GM (1%) TD GEL: 5.0000 g | Freq: Every day | TRANSDERMAL | 1 refills | Status: DC

## 2016-08-11 MED ORDER — FENOFIBRATE 160 MG PO TABS: 160.0000 mg | ORAL_TABLET | Freq: Every day | ORAL | 1 refills | Status: DC

## 2016-08-11 MED ORDER — SUCRALFATE 1 G PO TABS: ORAL_TABLET | ORAL | 1 refills | Status: DC

## 2016-08-11 MED ORDER — ACYCLOVIR 800 MG PO TABS: ORAL_TABLET | ORAL | 1 refills | Status: DC

## 2016-08-11 MED ORDER — POTASSIUM CHLORIDE ER 10 MEQ PO TBCR: 10.0000 meq | EXTENDED_RELEASE_TABLET | Freq: Three times a day (TID) | ORAL | 1 refills | Status: DC

## 2016-08-11 MED ORDER — MONTELUKAST SODIUM 10 MG PO TABS: 10.0000 mg | ORAL_TABLET | Freq: Every day | ORAL | 1 refills | Status: DC

## 2016-08-11 NOTE — Patient Instructions (Signed)

## 2016-08-11 NOTE — Progress Notes (Signed)
[facility]   Unk Pinto, M.D.    Uvaldo Bristle. Silverio Lay, P.A.-C      Starlyn Skeans, P.A.-C  [facility]                [address] [place], [place] [id number] Telephone [phone] Telefax [phone] Annual  Screening/Preventative Visit  & Comprehensive Evaluation & Examination     This very nice 60 y.o. DBM presents for a Screening/Preventative Visit & comprehensive evaluation and management of multiple medical co-morbidities.  Patient has been followed for HTN, Morbid Obesity, Prediabetes, Hyperlipidemia and Vitamin D Deficiency. Patient's gout is quiescent on his Allopurinol. Patient a;lso has yhx/o GERD and barrett's and sx's are controlled on Nexium bid.     HTN predates circa 2005 . Patient's BP has been controlled at home.  Today's BP is elevated at 146/86. Patient denies any cardiac symptoms as chest pain, palpitations, shortness of breath, dizziness or ankle swelling.     Patient's hyperlipidemia is controlled with diet and medications. Patient denies myalgias or other medication SE's. Last lipids were at goal albeit sl elevated Trig's: Lab Results  Component Value Date   CHOL 169 04/23/2016   HDL 40 04/23/2016   LDLCALC 94 04/23/2016   TRIG 174 (H) 04/23/2016   CHOLHDL 4.2 04/23/2016      Patient has severe Morbid Obesity (BMI 41.62) and consequent prediabetes  with A1c 5.9% since 2010 and the had an A1c 6.1% in 2012 and 5.8% in 2016. Patient has finally improved his eating habits and has lost 36# over the last 2 months!  He denies reactive hypoglycemic symptoms, visual blurring, diabetic polys or paresthesias. Last A1c was near goal: Lab Results  Component Value Date   HGBA1C 5.7 (H) 04/23/2016       Patient also is on replacement for hx/o HypoGonadism with low Testosterone level 228 in 2001. Finally, patient has history of Vitamin D Deficiency in 2008 of "17" and last vitamin D was at  goal: Lab Results  Component Value Date   VD25OH 76 01/11/2016   Current Outpatient Prescriptions on File Prior to Visit  Medication Sig  . acyclovir  800 MG tablet Take 1 tablet by mouth  daily for HSV  . Allopurinol 100 MG tablet Take 1 tablet by mouth  daily to prevent gout  . ALPRAZolam  1 MG tablet Take 1/2 -1 tab tid (Patient taking differently: Take 1 mg by mouth 3 (three) times daily as needed for anxiety. )  . VITAMIN B 12 PO Take 1 tablet by mouth daily.    . fenofibrate 160 MG tablet Take 1 tablet by mouth  daily  . fish oil-omega-3  1000 MG  Take 1 g by mouth daily.   Marland Kitchen FLAX SEEDS Take by mouth.  . furosemide20 MG tablet Take 1 tablet by mouth  daily  . ibuprofen  600 MG tablet Take 1 tablet (600 mg total) by mouth every 6 (six) hours as needed.  . magnesium  400 MG tablet Take 400 mg by mouth 2 (two) times daily.   . methocarbamol  500 MG tablet Take 1 tablet (500 mg total) by mouth 2 (two) times daily.  . montelukast  10 MG tablet Take 1 tablet by mouth at  bedtime  . Multi-Vitw/Min Take by mouth.  Marland Kitchen NEXIUM 40 MG capsule TAKE 1 CAPSULE BY MOUTH 2  TIMES DAILY  BEFORE MEALS.  Marland Kitchen AFRIN  0.05 % nasal spray Place 2 sprays into both nostrils 2 (two) times daily.  Marland Kitchen K-DUR 10 MEQ tablet Take 1 tablet by mouth 3  times a day  . sucralfate  1 G tablet Take 1 tablet by mouth 4  times daily before meals  and at bedtime  . TESTIM 50 MG/5GM (1%) GEL Place 5 g onto the skin daily.  . valsartan-hctz 160-25 MGt TAKE 1 TABLET BY MOUTH  DAILY FOR BLOOD PRESSURE  . vitamin C  500 MG tablet Take 500 mg by mouth daily.   . Vitamin D 50,000 units  Take 1 capsule 6 days per week)   Allergies  Allergen Reactions  . Hyzaar [Losartan Potassium-Hctz]     Pt doesn't like this medicine for his Bp- it didn't work- no allergy to this med   Past Medical History:  Diagnosis Date  . Allergy   . Anxiety   . Barrett's esophagus   . GERD (gastroesophageal reflux disease)   . Gout   . H/O hiatal  hernia   . Hyperlipidemia   . Hypertension   . Prediabetes   . Vitamin D deficiency    Health Maintenance  Topic Date Due  . Hepatitis C Screening  [birth year]  . HIV Screening  11/07/1972  . INFLUENZA VACCINE  04/01/2016  . COLONOSCOPY  07/04/2019  . TETANUS/TDAP  08/02/2023   Immunization History  Administered Date(s) Administered  . Influenza Split 06/28/2014, 06/28/2015  . Influenza-Unspecified 09/01/2012  . PPD Test 06/28/2014  . Pneumococcal-Unspecified 09/01/1998  . Tdap 08/01/2013   Past Surgical History:  Procedure Laterality Date  . CLOSED REDUCTION NASAL FRACTURE N/A 06/10/2016   Procedure: CLOSED REDUCTION NASAL FRACTURE FOREHEAD SUTURE REMOVAL;  Surgeon: [name], DO;  Location: [facility];  Service: Plastics;  Laterality: N/A;  . COLONOSCOPY    . [place]  . Left knee Arthroscopy    . Right knee arthroscopy    . UPPER GASTROINTESTINAL ENDOSCOPY     last egd 12-7*2012   Family History  Problem Relation Age of Onset  . Arthritis Mother   . Lung cancer Mother   . Heart disease Mother   . Colon cancer Maternal Uncle   . Stroke Brother   . Esophageal cancer Neg Hx   . Rectal cancer Neg Hx   . Stomach cancer Neg Hx    Social History   Social History  . Marital status: Single    Spouse name: N/A  . Number of children: 1  . Years of education: N/A   Occupational History  . law enforcement    Social History Main Topics  . Smoking status: Former Smoker    Years: 6.00    Types: Cigarettes    Quit date: 02/07/2013  . Smokeless tobacco: Never Used  . Alcohol use No  . Drug use: No  . Sexual activity: Yes    Partners: Male    ROS Constitutional: Denies fever, chills, weight loss/gain, headaches, insomnia,  night sweats or change in appetite. Does c/o fatigue. Eyes: Denies redness, blurred vision, diplopia, discharge, itchy or watery eyes.  ENT: Denies discharge, congestion, post nasal drip, epistaxis, sore  throat, earache, hearing loss, dental pain, Tinnitus, Vertigo, Sinus pain or snoring.  Cardio: Denies chest pain, palpitations, irregular heartbeat, syncope, dyspnea, diaphoresis, orthopnea, PND, claudication or edema Respiratory: denies cough, dyspnea, DOE, pleurisy, hoarseness, laryngitis or wheezing.  Gastrointestinal: Denies dysphagia, heartburn, reflux, water brash, pain, cramps,  nausea, vomiting, bloating, diarrhea, constipation, hematemesis, melena, hematochezia, jaundice or hemorrhoids Genitourinary: Denies dysuria, frequency, urgency, nocturia, hesitancy, discharge, hematuria or flank pain Musculoskeletal: Denies arthralgia, myalgia, stiffness, Jt. Swelling, pain, limp or strain/sprain. Denies Falls. Skin: Denies puritis, rash, hives, warts, acne, eczema or change in skin lesion Neuro: No weakness, tremor, incoordination, spasms, paresthesia or pain Psychiatric: Denies confusion, memory loss or sensory loss. Denies Depression. Endocrine: Denies change in weight, skin, hair change, nocturia, and paresthesia, diabetic polys, visual blurring or hyper / hypo glycemic episodes.  Heme/Lymph: No excessive bleeding, bruising or enlarged lymph nodes.  Physical Exam  BP  146/86   P 80   T 97.5 F  R 16   Ht 5' 11.5"    Wt  302 lb 9.6 oz          BMI 41.62   General Appearance:  Over nourished, in no apparent distress.  Eyes: PERRLA, EOMs, conjunctiva no swelling or erythema, normal fundi and vessels. Sinuses: No frontal/maxillary tenderness ENT/Mouth: EACs patent / TMs  nl. Nares clear without erythema, swelling, mucoid exudates. Oral hygiene is good. No erythema, swelling, or exudate. Tongue normal, non-obstructing. Tonsils not swollen or erythematous. Hearing normal.  Neck: Supple, thyroid normal. No bruits, nodes or JVD. Respiratory: Respiratory effort normal.  BS equal and clear bilateral without rales, rhonci, wheezing or stridor. Cardio: Heart sounds are normal with regular rate and  rhythm and no murmurs, rubs or gallops. Peripheral pulses are normal and equal bilaterally without edema. No aortic or femoral bruits. Chest: symmetric with normal excursions and percussion.  Abdomen: Soft, rotund with Nl bowel sounds. Nontender, no guarding, rebound, hernias, masses, or organomegaly.  Lymphatics: Non tender without lymphadenopathy.  Genitourinary: No hernias.Testes nl. DRE - prostate nl for age - smooth & firm w/o nodules. Musculoskeletal: Full ROM all peripheral extremities, joint stability, 5/5 strength, and normal gait. Skin: Warm and dry without rashes, lesions, cyanosis, clubbing or  ecchymosis.  Neuro: Cranial nerves intact, reflexes equal bilaterally. Normal muscle tone, no cerebellar symptoms. Sensation intact.  Pysch: Alert and oriented X 3 with normal affect, insight and judgment appropriate.   Assessment and Plan  1. Annual Preventative/Screening Exam   2. Essential hypertension  - Microalbumin / creatinine urine ratio - EKG 12-Lead - [place], RETROPERITNL ABD,  LTD - Urinalysis, Routine w reflex microscopic - CBC with Differential/Platelet - BASIC METABOLIC PANEL WITH GFR - TSH  3. Mixed hyperlipidemia  - EKG 12-Lead - [place], RETROPERITNL ABD,  LTD - Hepatic function panel - Lipid panel - TSH  4. Prediabetes  - EKG 12-Lead - [place], RETROPERITNL ABD,  LTD - Hemoglobin A1c - Insulin, random  5. Vitamin D deficiency  - VITAMIN D 25 Hydroxy   6. Stage 3 CKD (GFR 56 ml/min)   7. Morbid obesity due to excess calories (BMI 41+)    ([place])   8. Idiopathic gout  - Uric acid  9. Testosterone deficiency  - Testosterone  10. Gastroesophageal reflux disease   11. Screening for rectal cancer  - POC Hemoccult Bld/Stl   12. Prostate cancer screening  - PSA  13. Screening for ischemic heart disease  - EKG 12-Lead  14. Screening for AAA (aortic abdominal aneurysm)  - [place], RETROPERITNL ABD,  LTD  15. Other fatigue  - Vitamin B12 - Iron and  TIBC - Testosterone - TSH  16. Medication management  - Urinalysis, Routine w reflex microscopic - CBC with Differential/Platelet - BASIC METABOLIC PANEL WITH GFR - Hepatic function panel - Magnesium  14. Screening examination for pulmonary tuberculosis  - PPD  18. Need for prophylactic vaccination and inoculation against influenza  - Flu Vaccine QUAD with presevative        Continue prudent diet as discussed, weight control, BP monitoring, regular exercise, and medications as discussed.  Discussed med effects and SE's. Routine screening labs and tests as requested with regular follow-up as recommended. Over 40 minutes of exam, counseling, chart review and high complex critical decision making was performed

## 2016-08-12 LAB — IRON AND TIBC
%SAT: 20 % (ref 15–60)
Iron: 76 ug/dL (ref 50–180)
TIBC: 374 ug/dL (ref 250–425)
UIBC: 298 ug/dL (ref 125–400)

## 2016-08-12 LAB — URINALYSIS, ROUTINE W REFLEX MICROSCOPIC
BILIRUBIN URINE: NEGATIVE
GLUCOSE, UA: NEGATIVE
Hgb urine dipstick: NEGATIVE
Ketones, ur: NEGATIVE
Leukocytes, UA: NEGATIVE
Nitrite: NEGATIVE
PROTEIN: NEGATIVE
SPECIFIC GRAVITY, URINE: 1.024 (ref 1.001–1.035)
pH: 6 (ref 5.0–8.0)

## 2016-08-12 LAB — HEPATIC FUNCTION PANEL
ALBUMIN: 4.2 g/dL (ref 3.6–5.1)
ALT: 31 U/L (ref 9–46)
AST: 31 U/L (ref 10–35)
Alkaline Phosphatase: 54 U/L (ref 40–115)
BILIRUBIN DIRECT: 0.2 mg/dL (ref <=0.2)
BILIRUBIN TOTAL: 0.6 mg/dL (ref 0.2–1.2)
Indirect Bilirubin: 0.4 mg/dL (ref 0.2–1.2)
Total Protein: 6.5 g/dL (ref 6.1–8.1)

## 2016-08-12 LAB — MICROALBUMIN / CREATININE URINE RATIO
Creatinine, Urine: 406 mg/dL — ABNORMAL HIGH (ref 20–370)
MICROALB/CREAT RATIO: 7 ug/mg{creat} (ref <30)
Microalb, Ur: 2.7 mg/dL

## 2016-08-12 LAB — BASIC METABOLIC PANEL WITH GFR
BUN: 8 mg/dL (ref 7–25)
CALCIUM: 9 mg/dL (ref 8.6–10.3)
CO2: 22 mmol/L (ref 20–31)
CREATININE: 0.99 mg/dL (ref 0.70–1.33)
Chloride: 106 mmol/L (ref 98–110)
GFR, Est African American: >89 mL/min (ref >=60)
GFR, Est Non African American: 84 mL/min (ref >=60)
Glucose, Bld: 94 mg/dL (ref 65–99)
Potassium: 4.1 mmol/L (ref 3.5–5.3)
SODIUM: 142 mmol/L (ref 135–146)

## 2016-08-12 LAB — URIC ACID: URIC ACID, SERUM: 8.8 mg/dL — AB (ref 4.0–8.0)

## 2016-08-12 LAB — VITAMIN D 25 HYDROXY (VIT D DEFICIENCY, FRACTURES): VIT D 25 HYDROXY: 81 ng/mL (ref 30–100)

## 2016-08-12 LAB — LIPID PANEL
CHOL/HDL RATIO: 3.5 ratio (ref <5.0)
CHOLESTEROL: 121 mg/dL (ref <200)
HDL: 35 mg/dL — AB (ref >40)
LDL Cholesterol: 71 mg/dL (ref <100)
Triglycerides: 77 mg/dL (ref <150)
VLDL: 15 mg/dL (ref <30)

## 2016-08-12 LAB — HEMOGLOBIN A1C
HEMOGLOBIN A1C: 5.4 % (ref <5.7)
Mean Plasma Glucose: 108 mg/dL

## 2016-08-12 LAB — MAGNESIUM: MAGNESIUM: 1.8 mg/dL (ref 1.5–2.5)

## 2016-08-12 LAB — TESTOSTERONE: TESTOSTERONE: 504 ng/dL (ref 250–827)

## 2016-08-12 LAB — INSULIN, RANDOM: INSULIN: 11.1 u[IU]/mL (ref 2.0–19.6)

## 2016-08-18 DIAGNOSIS — J342 Deviated nasal septum: Secondary | ICD-10-CM | POA: Insufficient documentation

## 2016-08-18 DIAGNOSIS — J343 Hypertrophy of nasal turbinates: Secondary | ICD-10-CM | POA: Insufficient documentation

## 2016-09-11 ENCOUNTER — Ambulatory Visit (INDEPENDENT_AMBULATORY_CARE_PROVIDER_SITE_OTHER): Payer: 59 | Admitting: Internal Medicine

## 2016-09-11 ENCOUNTER — Encounter: Payer: Self-pay | Admitting: Internal Medicine

## 2016-09-11 VITALS — BP 126/74 | HR 80 | Temp 100.0°F | Resp 16 | Ht 71.5 in | Wt 300.2 lb

## 2016-09-11 DIAGNOSIS — J0410 Acute tracheitis without obstruction: Secondary | ICD-10-CM | POA: Diagnosis not present

## 2016-09-11 DIAGNOSIS — J0140 Acute pansinusitis, unspecified: Secondary | ICD-10-CM | POA: Diagnosis not present

## 2016-09-11 DIAGNOSIS — R509 Fever, unspecified: Secondary | ICD-10-CM

## 2016-09-11 MED ORDER — AZITHROMYCIN 250 MG PO TABS: ORAL_TABLET | ORAL | 1 refills | Status: DC

## 2016-09-11 MED ORDER — PREDNISONE 20 MG PO TABS: ORAL_TABLET | ORAL | 0 refills | Status: DC

## 2016-09-11 NOTE — Progress Notes (Signed)
[facility]   Unk Pinto, M.D.    Uvaldo Bristle. Silverio Lay, P.A.-C      Starlyn Skeans, P.A.-C  [facility]                [address] [place], [place] [id number] Telephone [phone] Telefax [phone]  Subjective:    Patient ID: Christopher [name], male    DOB: 12/27/1955, 61 y.o.   MRN: [id number]  HPI   This nice 61 yo DBM with HTN, HLD, Morbid Obesity , PreDM & GERD presents with a 2-3 day hx/o of fever, chills, sweats, myalgias, HA, N/V and head/chest congestion with a dry non-productive cough. Denies CP, rash or dyspnea.   Medication Sig  . acyclovir  800 MG tablet Take 1 tablet by mouth  daily for HSV  . allopurinol  100 MG tablet Take 1 tablet by mouth  daily to prevent gout  . ALPRAZolam  1 MG tablet Take 1/2 -1 tab tid  . VITAMIN B 12 tab Take 1 tablet by mouth daily.    . fenofibrate 160 MG tablet Take 1 tablet (160 mg total) by mouth daily.  . fish oil-omega-3 1000 MG cap Take 1 g by mouth daily.   Marland Kitchen FLAX SEEDS Take by mouth.  . furosemide (LASIX) 20 MG tablet Take 1 tablet (20 mg total) by mouth daily.  . magnesium oxide  400 MG tablet Take 400 mg by mouth 2 (two) times daily.   . Methocarbamol 500 MG tablet Take 1 tablet by mouth at bedtime.  . montelukast  10 MG tablet Take 1 tablet (10 mg total) by mouth at bedtime.  . Multiple Vitamins-Minerals Take by mouth.  Marland Kitchen NEXIUM 40 MG capsule TAKE 1 CAP 2  TIMES DAILY BEFORE MEALS.  Marland Kitchen K-DUR 10 MEQ tablet Take 1 tab 3 (three) times daily.  . sucralfate  1 g tablet Take 1 tab 4  times daily before meals  and at bedtime  . TESTIM 50 MG/5GM (1%) GEL Place 5 g onto the skin daily.  . valsartan-hctyz 160-25 MG tablet Take 1 tablet by mouth daily. for blood pressure  . vitamin C  500 MG tablet Take 500 mg by mouth daily.   . Vitamin D 50,000 units  Take 1 cap (50,000 Units total) by mouth daily.  Marland Kitchen ibuprofen  600 MG tablet Take 1 tab every 6 (six)  hours as needed.  Melissa Montane NASAL SPRAY Place 2 sprays into both nostrils 2 (two) times daily.   Allergies  Allergen Reactions  . Hyzaar [Losartan Potassium-Hctz]     Pt doesn't like this medicine for his Bp- it didn't work- no allergy to this med   Review of Systems  10 point systems review negative except as above.    Objective:   Physical Exam  BP 126/74   Pulse 80   Temp 100 F (37.8 C)   Resp 16   Ht 5' 11.5" (1.816 m)   Wt (!) 300 lb 3.2 oz (136.2 kg)   BMI 41.29 kg/m   Skin -warm/dry, clear w/o rash, cyanosis or icterus. No Respiratory stridor.  HEENT - Eac's patent. TM's Nl. EOM's full. PERRLA. (+) frontal/maxillary tenderness.NasoOroPharynx clear. Neck - supple. Nl Thyroid. Carotids 2+ & No bruits, nodes, JVD Chest - Bilateral coarse dry rales &rhonchi and no wheezes. Cor - Nl HS. RRR w/o sig MGR. PP  1(+). No edema. Abd - No palpable organomegaly, masses or tenderness. BS nl. MS- FROM w/o deformities.  Gait Nl. Neuro -  Nl w/o focal abnormalities.  Rapid flu test is negative    Assessment & Plan:   1. Tracheitis  2. Acute pansinusitis, recurrence not specified  - predniSONE (DELTASONE) 20 MG tablet; 1 tab 3 x day for 3 days, then 1 tab 2 x day for 3 days, then 1 tab 1 x day for 5 days  Dispense: 20 tablet; Refill: 0  - azithromycin (ZITHROMAX) 250 MG tablet; Take 2 tablets (500 mg) on  Day 1,  followed by 1 tablet (250 mg) once daily on Days 2 through 5.  Dispense: 6 each; Refill: 1  3. Fever, unspecified fever cause  - POC Influenza A&B(BINAX/QUICKVUE) - > MNegative +++++++++++++++++++++++++++  - Discussed meds & SE's and ROV prn

## 2016-09-12 LAB — POCT INFLUENZA A/B
INFLUENZA A, POC: NEGATIVE
INFLUENZA B, POC: NEGATIVE

## 2016-09-15 DIAGNOSIS — R040 Epistaxis: Secondary | ICD-10-CM | POA: Diagnosis not present

## 2016-09-17 ENCOUNTER — Other Ambulatory Visit: Payer: Self-pay | Admitting: Internal Medicine

## 2016-09-18 DIAGNOSIS — J019 Acute sinusitis, unspecified: Secondary | ICD-10-CM | POA: Diagnosis not present

## 2016-09-18 DIAGNOSIS — J40 Bronchitis, not specified as acute or chronic: Secondary | ICD-10-CM | POA: Diagnosis not present

## 2016-09-19 DIAGNOSIS — R040 Epistaxis: Secondary | ICD-10-CM | POA: Insufficient documentation

## 2016-11-12 ENCOUNTER — Ambulatory Visit (INDEPENDENT_AMBULATORY_CARE_PROVIDER_SITE_OTHER): Payer: 59 | Admitting: Internal Medicine

## 2016-11-12 ENCOUNTER — Encounter: Payer: Self-pay | Admitting: Internal Medicine

## 2016-11-12 VITALS — BP 144/96 | HR 80 | Temp 98.2°F | Resp 18 | Ht 71.5 in | Wt 300.0 lb

## 2016-11-12 DIAGNOSIS — N183 Chronic kidney disease, stage 3 (moderate): Secondary | ICD-10-CM | POA: Diagnosis not present

## 2016-11-12 DIAGNOSIS — Z79899 Other long term (current) drug therapy: Secondary | ICD-10-CM

## 2016-11-12 DIAGNOSIS — E559 Vitamin D deficiency, unspecified: Secondary | ICD-10-CM

## 2016-11-12 DIAGNOSIS — M1000 Idiopathic gout, unspecified site: Secondary | ICD-10-CM

## 2016-11-12 DIAGNOSIS — E782 Mixed hyperlipidemia: Secondary | ICD-10-CM | POA: Diagnosis not present

## 2016-11-12 DIAGNOSIS — N1831 Chronic kidney disease, stage 3a: Secondary | ICD-10-CM

## 2016-11-12 DIAGNOSIS — I10 Essential (primary) hypertension: Secondary | ICD-10-CM

## 2016-11-12 MED ORDER — PHENTERMINE HCL 37.5 MG PO TABS: 37.5000 mg | ORAL_TABLET | Freq: Every day | ORAL | 4 refills | Status: DC

## 2016-11-12 NOTE — Progress Notes (Signed)
Assessment and Plan:  Hypertension:  -patient has been off medication last 3 days due to forgetting it while traveling.  He will take it when he returns home -patient aware that phentermine can cause his BP to be elevated and he is checking regularly at home.  Will call us if BP > 150/90 -Continue medication,  -monitor blood pressure at home.  -Continue DASH diet.   -Reminder to go to the ER if any CP, SOB, nausea, dizziness, severe HA, changes vision/speech, left arm numbness and tingling, and jaw pain.  Cholesterol: -cont fenofibrate -Continue diet and exercise.   History of Pre-diabetes: -he is no longer prediabetic according to A1c -no need to check A1c today -Continue diet and exercise.    Vitamin D Def: -continue medications.   Gout -cont allopurinol -well controlled currently -avoid trigger foods  Morbid obesity -cont diet and exercise -recommended changing exercise routine as he has currently adapted to it -phentermine refilled  Chronic sinusitis Followed by ENT -cont allergy medications  Continue diet and meds as discussed. Further disposition pending results of labs.  HPI 61 y.o. male  presents for 3 month follow up with hypertension, hyperlipidemia, prediabetes and vitamin D.   His blood pressure has been controlled at home, today their BP is BP: (!) 144/96.   He does workout. He denies chest pain, shortness of breath, dizziness.  He has been off his blood pressure medication for the last three days.  He reports that they went out of town and he forgot it here.  He reports that normally it is really good.     He is on cholesterol medication and denies myalgias. His cholesterol is at goal. The cholesterol last visit was:   Lab Results  Component Value Date   CHOL 121 08/11/2016   HDL 35 (L) 08/11/2016   LDLCALC 71 08/11/2016   TRIG 77 08/11/2016   CHOLHDL 3.5 08/11/2016    Patient is on Vitamin D supplement.  Lab Results  Component Value Date   VD25OH  54 08/11/2016     He reports that he would like to go back on the phentermine.  He reports that he has been off of them for about a year.  He had some good success with it.  He reports he would like to go back on them.  He is at a plateau right now.    He reports that he has been doing better since the surgery on his nose.  He has been having some mild congestion but it is not bothersome to him.    He still has some issues with his acid reflux.  He is taking carafate every now and then.  He has no relief taking ranitidine in addition to his PPI.  He takes the carafate twice weekly.  He also does not avoid his trigger foods per his report.  He has not recently had a gout flare.  He reports that his joints have been feeling good.    Current Medications:  Current Outpatient Prescriptions on File Prior to Visit  Medication Sig Dispense Refill  . 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 allopurinol (ZYLOPRIM) 100 MG tablet Take 1 tablet by mouth  daily to prevent gout 90 tablet 3  . ALPRAZolam (XANAX) 1 MG tablet Take 1/2 -1 tab tid 270 tablet 1  . Cyanocobalamin (VITAMIN B 12 PO) Take 1 tablet by mouth daily.      Marland Kitchen  fenofibrate 160 MG tablet Take 1 tablet (160 mg total) by mouth daily. 90 tablet 1  . fish oil-omega-3 fatty acids 1000 MG capsule Take 1 g by mouth daily.     . Flaxseed, Linseed, (FLAX SEEDS PO) Take by mouth.    . furosemide (LASIX) 20 MG tablet Take 1 tablet (20 mg total) by mouth daily. 90 tablet 1  . magnesium oxide (MAG-OX) 400 MG tablet Take 400 mg by mouth 2 (two) times daily.     . methocarbamol (ROBAXIN) 500 MG tablet Take 1 tablet by mouth at bedtime. 90 tablet 1  . montelukast (SINGULAIR) 10 MG tablet Take 1 tablet (10 mg total) by mouth at bedtime. 90 tablet 1  . Multiple Vitamin (MULTIVITAMIN WITH MINERALS) TABS tablet Take 1 tablet by mouth daily.    Marland Kitchen NEXIUM 40 MG capsule TAKE 1 CAPSULE BY MOUTH 2  TIMES DAILY BEFORE  MEALS. 180 capsule 1  . potassium chloride (K-DUR) 10 MEQ tablet Take 1 tablet (10 mEq total) by mouth 3 (three) times daily. 270 tablet 1  . sucralfate (CARAFATE) 1 g tablet Take 1 tablet by mouth 4  times daily before meals  and at bedtime 360 tablet 1  . testosterone (TESTIM) 50 MG/5GM (1%) GEL Place 5 g onto the skin daily. 90 Tube 1  . valsartan-hydrochlorothiazide (DIOVAN-HCT) 160-25 MG tablet Take 1 tablet by mouth daily. for blood pressure 90 tablet 1  . vitamin C (ASCORBIC ACID) 500 MG tablet Take 500 mg by mouth daily.     . Vitamin D, Ergocalciferol, (DRISDOL) 50000 units CAPS capsule Take 1 capsule (50,000 Units total) by mouth daily. 90 capsule 1   No current facility-administered medications on file prior to visit.     Medical History:  Past Medical History:  Diagnosis Date  . Allergy   . Anxiety   . Barrett's esophagus   . GERD (gastroesophageal reflux disease)   . Gout   . H/O hiatal hernia   . Hyperlipidemia   . Hypertension   . Prediabetes   . Vitamin D deficiency     Allergies:  Allergies  Allergen Reactions  . Hyzaar [Losartan Potassium-Hctz]     Pt doesn't like this medicine for his Bp- it didn't work- no allergy to this med     Review of Systems:  Review of Systems  Constitutional: Negative for chills, fever and malaise/fatigue.  HENT: Positive for congestion. Negative for ear pain, hearing loss, nosebleeds, sinus pain and sore throat.   Eyes: Negative.   Respiratory: Negative for cough, shortness of breath and wheezing.   Cardiovascular: Negative for chest pain, palpitations and leg swelling.  Gastrointestinal: Positive for heartburn. Negative for abdominal pain, blood in stool, constipation, diarrhea and melena.  Genitourinary: Negative.   Skin: Negative.   Neurological: Negative for dizziness, sensory change, loss of consciousness and headaches.  Psychiatric/Behavioral: Negative for depression. The patient is not nervous/anxious and does not have  insomnia.     Family history- Review and unchanged  Social history- Review and unchanged  Physical Exam: BP (!) 144/96   Pulse 80   Temp 98.2 F (36.8 C) (Temporal)   Resp 18   Ht 5' 11.5" (1.816 m)   Wt 300 lb (136.1 kg)   BMI 41.26 kg/m  Wt Readings from Last 3 Encounters:  11/12/16 300 lb (136.1 kg)  09/11/16 (!) 300 lb 3.2 oz (136.2 kg)  08/11/16 (!) 302 lb 9.6 oz (137.3 kg)    General Appearance: Morbidly Obese, AAM,  Well nourished well developed, in no apparent distress. Eyes: PERRLA, EOMs, conjunctiva no swelling or erythema ENT/Mouth: Ear canals normal without obstruction, swelling, erythma, discharge.  TMs normal bilaterally.  Oropharynx moist, clear, without exudate, or postoropharyngeal swelling. Neck: Supple, thyroid normal,no cervical adenopathy  Respiratory: Respiratory effort normal, Breath sounds clear A&P without rhonchi, wheeze, or rale.  No retractions, no accessory usage. Cardio: RRR with no MRGs. Brisk peripheral pulses without edema.  Abdomen: Soft, + BS,  Non tender, no guarding, rebound, hernias, masses. Musculoskeletal: Full ROM, 5/5 strength, Normal gait Skin: Warm, dry without rashes, lesions, ecchymosis.  Neuro: Awake and oriented X 3, Cranial nerves intact. Normal muscle tone, no cerebellar symptoms. Psych: Normal affect, Insight and Judgment appropriate.    Starlyn Skeans, PA-C 10:36 AM [facility]

## 2016-12-28 ENCOUNTER — Other Ambulatory Visit: Payer: Self-pay | Admitting: Internal Medicine

## 2017-02-19 ENCOUNTER — Ambulatory Visit (INDEPENDENT_AMBULATORY_CARE_PROVIDER_SITE_OTHER): Payer: 59 | Admitting: Internal Medicine

## 2017-02-19 ENCOUNTER — Encounter: Payer: Self-pay | Admitting: Internal Medicine

## 2017-02-19 VITALS — BP 136/86 | HR 76 | Temp 97.3°F | Resp 16 | Ht 71.5 in | Wt 295.0 lb

## 2017-02-19 DIAGNOSIS — K219 Gastro-esophageal reflux disease without esophagitis: Secondary | ICD-10-CM

## 2017-02-19 DIAGNOSIS — E782 Mixed hyperlipidemia: Secondary | ICD-10-CM

## 2017-02-19 DIAGNOSIS — E349 Endocrine disorder, unspecified: Secondary | ICD-10-CM | POA: Diagnosis not present

## 2017-02-19 DIAGNOSIS — M1000 Idiopathic gout, unspecified site: Secondary | ICD-10-CM | POA: Diagnosis not present

## 2017-02-19 DIAGNOSIS — I10 Essential (primary) hypertension: Secondary | ICD-10-CM

## 2017-02-19 DIAGNOSIS — R7303 Prediabetes: Secondary | ICD-10-CM | POA: Diagnosis not present

## 2017-02-19 DIAGNOSIS — N183 Chronic kidney disease, stage 3 (moderate): Secondary | ICD-10-CM

## 2017-02-19 DIAGNOSIS — E559 Vitamin D deficiency, unspecified: Secondary | ICD-10-CM | POA: Diagnosis not present

## 2017-02-19 DIAGNOSIS — Z79899 Other long term (current) drug therapy: Secondary | ICD-10-CM | POA: Diagnosis not present

## 2017-02-19 DIAGNOSIS — N1831 Chronic kidney disease, stage 3a: Secondary | ICD-10-CM

## 2017-02-19 LAB — CBC WITH DIFFERENTIAL/PLATELET
BASOS ABS: 0 {cells}/uL (ref 0–200)
Basophils Relative: 0 %
EOS PCT: 2 %
Eosinophils Absolute: 140 cells/uL (ref 15–500)
HCT: 39.1 % (ref 38.5–50.0)
Hemoglobin: 13 g/dL — ABNORMAL LOW (ref 13.2–17.1)
LYMPHS PCT: 32 %
Lymphs Abs: 2240 cells/uL (ref 850–3900)
MCH: 28 pg (ref 27.0–33.0)
MCHC: 33.2 g/dL (ref 32.0–36.0)
MCV: 84.1 fL (ref 80.0–100.0)
MONOS PCT: 13 %
MPV: 10.7 fL (ref 7.5–12.5)
Monocytes Absolute: 910 cells/uL (ref 200–950)
NEUTROS PCT: 53 %
Neutro Abs: 3710 cells/uL (ref 1500–7800)
Platelets: 167 10*3/uL (ref 140–400)
RBC: 4.65 MIL/uL (ref 4.20–5.80)
RDW: 16 % — ABNORMAL HIGH (ref 11.0–15.0)
WBC: 7 10*3/uL (ref 3.8–10.8)

## 2017-02-19 LAB — TSH: TSH: 1.92 m[IU]/L (ref 0.40–4.50)

## 2017-02-19 NOTE — Patient Instructions (Addendum)
Recommend Adult Low Dose Aspirin or  coated  Aspirin 81 mg daily  To reduce risk of Colon Cancer 20 %,  Skin Cancer 26 % ,  Melanoma 46%  and  Pancreatic cancer 60% +++++++++++++++++++++++++ Vitamin D goal  is between 70-100.  Please make sure that you are taking your Vitamin D as directed.  It is very important as a natural anti-inflammatory  helping hair, skin, and nails, as well as reducing stroke and heart attack risk.  It helps your bones and helps with mood. It also decreases numerous cancer risks so please take it as directed.  Low Vit D is associated with a 200-300% higher risk for CANCER  and 200-300% higher risk for HEART   ATTACK  &  STROKE.   ...................................... It is also associated with higher death rate at younger ages,  autoimmune diseases like Rheumatoid arthritis, Lupus, Multiple Sclerosis.    Also many other serious conditions, like depression, Alzheimer's Dementia, infertility, muscle aches, fatigue, fibromyalgia - just to name a few. ++++++++++++++++++++ Recommend the book "The END of DIETING" by Dr Joel Fuhrman  & the book "The END of DIABETES " by Dr Joel Fuhrman At [employer] - get book & Audio CD's    Being diabetic has a  300% increased risk for heart attack, stroke, cancer, and alzheimer- type vascular dementia. It is very important that you work harder with diet by avoiding all foods that are white. Avoid white rice (brown & wild rice is OK), white potatoes (sweetpotatoes in moderation is OK), White bread or wheat bread or anything made out of white flour like bagels, donuts, rolls, buns, biscuits, cakes, pastries, cookies, pizza crust, and pasta (made from white flour & egg whites) - vegetarian pasta or spinach or wheat pasta is OK. Multigrain breads like Arnold's or Pepper[employer], or multigrain sandwich thins or flatbreads.  Diet, exercise and weight loss can reverse and cure diabetes in the early stages.  Diet, exercise and weight loss is  very important in the control and prevention of complications of diabetes which affects every system in your body, ie. Brain - dementia/stroke, eyes - glaucoma/blindness, heart - heart attack/heart failure, kidneys - dialysis, stomach - gastric paralysis, intestines - malabsorption, nerves - severe painful neuritis, circulation - gangrene & loss of a leg(s), and finally cancer and Alzheimers.    I recommend avoid fried & greasy foods,  sweets/candy, white rice (brown or wild rice or Quinoa is OK), white potatoes (sweet potatoes are OK) - anything made from white flour - bagels, doughnuts, rolls, buns, biscuits,white and wheat breads, pizza crust and traditional pasta made of white flour & egg white(vegetarian pasta or spinach or wheat pasta is OK).  Multi-grain bread is OK - like multi-grain flat bread or sandwich thins. Avoid alcohol in excess. Exercise is also important.    Eat all the vegetables you want - avoid meat, especially red meat and dairy - especially cheese.  Cheese is the most concentrated form of trans-fats which is the worst thing to clog up our arteries. Veggie cheese is OK which can be found in the fresh produce section at [employer] or [employer] or [employer]  +++++++++++++++++++++ DASH Eating Plan  DASH stands for "Dietary Approaches to Stop Hypertension."   The DASH eating plan is a healthy eating plan that has been shown to reduce high blood pressure (hypertension). Additional health benefits may include reducing the risk of type 2 diabetes mellitus, heart disease, and stroke. The DASH eating plan may also   help with weight loss. WHAT DO I NEED TO KNOW ABOUT THE DASH EATING PLAN? For the DASH eating plan, you will follow these general guidelines:  Choose foods with a percent daily value for sodium of less than 5% (as listed on the food label).  Use salt-free seasonings or herbs instead of table salt or sea salt.  Check with your health care provider or pharmacist before  using salt substitutes.  Eat lower-sodium products, often labeled as "lower sodium" or "no salt added."  Eat fresh foods.  Eat more vegetables, fruits, and low-fat dairy products.  Choose whole grains. Look for the word "whole" as the first word in the ingredient list.  Choose fish   Limit sweets, desserts, sugars, and sugary drinks.  Choose heart-healthy fats.  Eat veggie cheese   Eat more home-cooked food and less restaurant, buffet, and fast food.  Limit fried foods.  Cook foods using methods other than frying.  Limit canned vegetables. If you do use them, rinse them well to decrease the sodium.  When eating at a restaurant, ask that your food be prepared with less salt, or no salt if possible.                      WHAT FOODS CAN I EAT? Read Dr Joel Fuhrman's books on The End of Dieting & The End of Diabetes  Grains Whole grain or whole wheat bread. Brown rice. Whole grain or whole wheat pasta. Quinoa, bulgur, and whole grain cereals. Low-sodium cereals. Corn or whole wheat flour tortillas. Whole grain cornbread. Whole grain crackers. Low-sodium crackers.  Vegetables Fresh or frozen vegetables (raw, steamed, roasted, or grilled). Low-sodium or reduced-sodium tomato and vegetable juices. Low-sodium or reduced-sodium tomato sauce and paste. Low-sodium or reduced-sodium canned vegetables.   Fruits All fresh, canned (in natural juice), or frozen fruits.  Protein Products  All fish and seafood.  Dried beans, peas, or lentils. Unsalted nuts and seeds. Unsalted canned beans.  Dairy Low-fat dairy products, such as skim or 1% milk, 2% or reduced-fat cheeses, low-fat ricotta or cottage cheese, or plain low-fat yogurt. Low-sodium or reduced-sodium cheeses.  Fats and Oils Tub margarines without trans fats. Light or reduced-fat mayonnaise and salad dressings (reduced sodium). Avocado. Safflower, olive, or canola oils. Natural peanut or almond butter.  Other Unsalted popcorn  and pretzels. The items listed above may not be a complete list of recommended foods or beverages. Contact your dietitian for more options.  +++++++++++++++  WHAT FOODS ARE NOT RECOMMENDED? Grains/ White flour or wheat flour White bread. White pasta. White rice. Refined cornbread. Bagels and croissants. Crackers that contain trans fat.  Vegetables  Creamed or fried vegetables. Vegetables in a . Regular canned vegetables. Regular canned tomato sauce and paste. Regular tomato and vegetable juices.  Fruits Dried fruits. Canned fruit in light or heavy syrup. Fruit juice.  Meat and Other Protein Products Meat in general - RED meat & White meat.  Fatty cuts of meat. Ribs, chicken wings, all processed meats as bacon, sausage, bologna, salami, fatback, hot dogs, bratwurst and packaged luncheon meats.  Dairy Whole or 2% milk, cream, half-and-half, and cream cheese. Whole-fat or sweetened yogurt. Full-fat cheeses or blue cheese. Non-dairy creamers and whipped toppings. Processed cheese, cheese spreads, or cheese curds.  Condiments Onion and garlic salt, seasoned salt, table salt, and sea salt. Canned and packaged gravies. Worcestershire sauce. Tartar sauce. Barbecue sauce. Teriyaki sauce. Soy sauce, including reduced sodium. Steak sauce. Fish sauce. Oyster sauce. Cocktail   sauce. Horseradish. Ketchup and mustard. Meat flavorings and tenderizers. Bouillon cubes. Hot sauce. Tabasco sauce. Marinades. Taco seasonings. Relishes.  Fats and Oils Butter, stick margarine, lard, shortening and bacon fat. Coconut, palm kernel, or palm oils. Regular salad dressings.  Pickles and olives. Salted popcorn and pretzels.  The items listed above may not be a complete list of foods and beverages to avoid.   Obesity, Adult Obesity is the condition of having too much total body fat. Being overweight or obese means that your weight is greater than what is considered healthy for your body size. Obesity is determined  by a measurement called BMI. BMI is an estimate of body fat and is calculated from height and weight. For adults, a BMI of 30 or higher is considered obese. Obesity can eventually lead to other health concerns and major illnesses, including:  Stroke.  Coronary artery disease (CAD).  Type 2 diabetes.  Some types of cancer, including cancers of the colon, breast, uterus, and gallbladder.  Osteoarthritis.  High blood pressure (hypertension).  High cholesterol.  Sleep apnea.  Gallbladder stones.  Infertility problems.  What are the causes? The main cause of obesity is taking in (consuming) more calories than your body uses for energy. Other factors that contribute to this condition may include:  Being born with genes that make you more likely to become obese.  Having a medical condition that causes obesity. These conditions include: ? Hypothyroidism. ? Polycystic ovarian syndrome (PCOS). ? Binge-eating disorder. ? Cushing syndrome.  Taking certain medicines, such as steroids, antidepressants, and seizure medicines.  Not being physically active (sedentary lifestyle).  Living where there are limited places to exercise safely or buy healthy foods.  Not getting enough sleep.  What increases the risk? The following factors may increase your risk of this condition:  Having a family history of obesity.  Being a woman of African-American descent.  Being a man of Hispanic descent.  What are the signs or symptoms? Having excessive body fat is the main symptom of this condition. How is this diagnosed? This condition may be diagnosed based on:  Your symptoms.  Your medical history.  A physical exam. Your health care provider may measure: ? Your BMI. If you are an adult with a BMI between 25 and less than 30, you are considered overweight. If you are an adult with a BMI of 30 or higher, you are considered obese. ? The distances around your hips and your waist  (circumferences). These may be compared to each other to help diagnose your condition. ? Your skinfold thickness. Your health care provider may gently pinch a fold of your skin and measure it.  How is this treated? Treatment for this condition often includes changing your lifestyle. Treatment may include some or all of the following:  Dietary changes. Work with your health care provider and a dietitian to set a weight-loss goal that is healthy and reasonable for you. Dietary changes may include eating: ? Smaller portions. A portion size is the amount of a particular food that is healthy for you to eat at one time. This varies from person to person. ? Low-calorie or low-fat options. ? More whole grains, fruits, and vegetables.  Regular physical activity. This may include aerobic activity (cardio) and strength training.  Medicine to help you lose weight. Your health care provider may prescribe medicine if you are unable to lose 1 pound a week after 6 weeks of eating more healthily and doing more physical activity.  Surgery. Surgical  options may include gastric banding and gastric bypass. Surgery may be done if: ? Other treatments have not helped to improve your condition. ? You have a BMI of 40 or higher. ? You have life-threatening health problems related to obesity.  Follow these instructions at home:  Eating and drinking   Follow recommendations from your health care provider about what you eat and drink. Your health care provider may advise you to: ? Limit fast foods, sweets, and processed snack foods. ? Choose low-fat options, such as low-fat milk instead of whole milk. ? Eat 5 or more servings of fruits or vegetables every day. ? Eat at home more often. This gives you more control over what you eat. ? Choose healthy foods when you eat out. ? Learn what a healthy portion size is. ? Keep low-fat snacks on hand. ? Avoid sugary drinks, such as soda, fruit juice, iced tea sweetened  with sugar, and flavored milk. ? Eat a healthy breakfast.  Drink enough water to keep your urine clear or pale yellow.  Do not go without eating for long periods of time (do not fast) or follow a fad diet. Fasting and fad diets can be unhealthy and even dangerous. Physical Activity  Exercise regularly, as told by your health care provider. Ask your health care provider what types of exercise are safe for you and how often you should exercise.  Warm up and stretch before being active.  Cool down and stretch after being active.  Rest between periods of activity. Lifestyle  Limit the time that you spend in front of your TV, computer, or video game system.  Find ways to reward yourself that do not involve food.  Limit alcohol intake to no more than 1 drink a day for nonpregnant women and 2 drinks a day for men. One drink equals 12 oz of beer, 5 oz of wine, or 1 oz of hard liquor. General instructions  Keep a weight loss journal to keep track of the food you eat and how much you exercise you get.  Take over-the-counter and prescription medicines only as told by your health care provider.  Take vitamins and supplements only as told by your health care provider.  Consider joining a support group. Your health care provider may be able to recommend a support group.  Keep all follow-up visits as told by your health care provider. This is important. Contact a health care provider if:  You are unable to meet your weight loss goal after 6 weeks of dietary and lifestyle changes. This information is not intended to replace advice given to you by your health care provider. Make sure you discuss any questions you have with your health care provider. Document Released: 09/25/2004 Document Revised: 01/21/2016 Document Reviewed: 06/06/2015 Elsevier Interactive Patient Education  2017 [employer].

## 2017-02-19 NOTE — Progress Notes (Signed)
[name] presents for 3 month follow up with Hypertension, Hyperlipidemia, Pre-Diabetes and Vitamin D Deficiency. Patient also has hx/o Gout controlled on Allopurinol.     Patient is treated for HTN (2005) & BP has been controlled at home. Today's BP: 136/86. Patient has had no complaints of any cardiac type chest pain, palpitations, dyspnea/orthopnea/PND, dizziness, claudication, or dependent edema.     Hyperlipidemia is controlled with diet & meds. Patient denies myalgias or other med SE's. Last Lipids were at goal: Lab Results  Component Value Date   CHOL 121 08/11/2016   HDL 35 (L) 08/11/2016   LDLCALC 71 08/11/2016   TRIG 77 08/11/2016   CHOLHDL 3.5 08/11/2016      Also, the patient has history of Morbid Obesity (BMI 40+) and PreDiabetes (A1c 5.9% in 2010, 6.1% in 2012 and 5.8% in 2016) and has had no symptoms of reactive hypoglycemia, diabetic polys, paresthesias or visual blurring.  Last A1c was at goal: Lab Results  Component Value Date   HGBA1C 5.4 08/11/2016      Patient has hx/o Low T (level 228 in 2001) and is on replacement by Testosterone Gel. Further, the patient also has history of Vitamin D Deficiency ("17" in 2008) and supplements vitamin D without any suspected side-effects. Last vitamin D was at goal:  Lab Results  Component Value Date   VD25OH 81 08/11/2016   Current Outpatient Prescriptions on File Prior to Visit  Medication Sig  . 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 allopurinol (ZYLOPRIM) 100 MG tablet Take 1 tablet by mouth  daily to prevent gout  . ALPRAZolam (XANAX) 1 MG tablet Take 1/2 -1 tab tid  . Cyanocobalamin (VITAMIN B 12 PO) Take 1 tablet by mouth daily.    . fenofibrate 160 MG tablet Take 1 tablet (160 mg total) by mouth daily.  . fish oil-omega-3 fatty acids 1000 MG capsule Take 1 g by mouth daily.   . Flaxseed, Linseed, (FLAX SEEDS PO) Take by mouth.  . furosemide (LASIX) 20 MG tablet  Take 1 tablet (20 mg total) by mouth daily.  . magnesium oxide (MAG-OX) 400 MG tablet Take 400 mg by mouth 2 (two) times daily.   . methocarbamol (ROBAXIN) 500 MG tablet Take 1 tablet by mouth at bedtime.  . montelukast (SINGULAIR) 10 MG tablet Take 1 tablet (10 mg total) by mouth at bedtime.  . Multiple Vitamin (MULTIVITAMIN WITH MINERALS) TABS tablet Take 1 tablet by mouth daily.  Marland Kitchen NEXIUM 40 MG capsule TAKE 1 CAPSULE BY MOUTH 2  TIMES DAILY BEFORE MEALS.  Marland Kitchen phentermine (ADIPEX-P) 37.5 MG tablet Take 1 tablet (37.5 mg total) by mouth daily before breakfast.  . potassium chloride (K-DUR) 10 MEQ tablet Take 1 tablet (10 mEq total) by mouth 3 (three) times daily.  . sucralfate (CARAFATE) 1 g tablet Take 1 tablet by mouth 4  times daily before meals  and at bedtime  . testosterone (TESTIM) 50 MG/5GM (1%) GEL Place 5 g onto the skin daily.  . valsartan-hydrochlorothiazide (DIOVAN-HCT) 160-25 MG tablet TAKE 1 TABLET BY MOUTH  DAILY FOR BLOOD PRESSURE  . vitamin C (ASCORBIC ACID) 500 MG tablet Take 500 mg by mouth daily.   . Vitamin D, Ergocalciferol, (DRISDOL) 50000 units CAPS capsule TAKE 1 CAPSULE BY MOUTH  DAILY   No current facility-administered medications on file prior to visit.    Allergies  Allergen Reactions  .  Hyzaar [Losartan Potassium-Hctz]     Pt doesn't like this medicine for his Bp- it didn't work- no allergy to this med   PMHx:   Past Medical History:  Diagnosis Date  . Allergy   . Anxiety   . Barrett's esophagus   . GERD (gastroesophageal reflux disease)   . Gout   . H/O hiatal hernia   . Hyperlipidemia   . Hypertension   . Prediabetes   . Vitamin D deficiency    Immunization History  Administered Date(s) Administered  . Influenza Split 06/28/2014, 06/28/2015  . Influenza,inj,quad, With Preservative 08/11/2016  . Influenza-Unspecified 09/01/2012  . PPD Test 06/28/2014, 08/11/2016  . Pneumococcal-Unspecified 09/01/1998  . Tdap 08/01/2013   Past Surgical  History:  Procedure Laterality Date  . CLOSED REDUCTION NASAL FRACTURE N/A 06/10/2016   Procedure: CLOSED REDUCTION NASAL FRACTURE FOREHEAD SUTURE REMOVAL;  Surgeon: [name], DO;  Location: [place];  Service: Plastics;  Laterality: N/A;  . COLONOSCOPY    . [place]  . Left knee Arthroscopy    . Right knee arthroscopy    . UPPER GASTROINTESTINAL ENDOSCOPY     last egd 12-7*2012   FHx:    Reviewed / unchanged  SHx:    Reviewed / unchanged  Systems Review:  Constitutional: Denies fever, chills, wt changes, headaches, insomnia, fatigue, night sweats, change in appetite. Eyes: Denies redness, blurred vision, diplopia, discharge, itchy, watery eyes.  ENT: Denies discharge, congestion, post nasal drip, epistaxis, sore throat, earache, hearing loss, dental pain, tinnitus, vertigo, sinus pain, snoring.  CV: Denies chest pain, palpitations, irregular heartbeat, syncope, dyspnea, diaphoresis, orthopnea, PND, claudication or edema. Respiratory: denies cough, dyspnea, DOE, pleurisy, hoarseness, laryngitis, wheezing.  Gastrointestinal: Denies dysphagia, odynophagia, heartburn, reflux, water brash, abdominal pain or cramps, nausea, vomiting, bloating, diarrhea, constipation, hematemesis, melena, hematochezia  or hemorrhoids. Genitourinary: Denies dysuria, frequency, urgency, nocturia, hesitancy, discharge, hematuria or flank pain. Musculoskeletal: Denies arthralgias, myalgias, stiffness, jt. swelling, pain, limping or strain/sprain.  Skin: Denies pruritus, rash, hives, warts, acne, eczema or change in skin lesion(s). Neuro: No weakness, tremor, incoordination, spasms, paresthesia or pain. Psychiatric: Denies confusion, memory loss or sensory loss. Endo: Denies change in weight, skin or hair change.  Heme/Lymph: No excessive bleeding, bruising or enlarged lymph nodes.  Physical Exam  BP 136/86   Pulse 76   Temp 97.3 F (36.3 C)   Resp 16   Ht 5'  11.5" (1.816 m)   Wt 295 lb (133.8 kg)   BMI 40.57 kg/m   Appears well nourished, well groomed  and in no distress.  Eyes: PERRLA, EOMs, conjunctiva no swelling or erythema. Sinuses: No frontal/maxillary tenderness ENT/Mouth: EAC's clear, TM's nl w/o erythema, bulging. Nares clear w/o erythema, swelling, exudates. Oropharynx clear without erythema or exudates. Oral hygiene is good. Tongue normal, non obstructing. Hearing intact.  Neck: Supple. Thyroid nl. Car 2+/2+ without bruits, nodes or JVD. Chest: Respirations nl with BS clear & equal w/o rales, rhonchi, wheezing or stridor.  Cor: Heart sounds normal w/ regular rate and rhythm without sig. murmurs, gallops, clicks or rubs. Peripheral pulses normal and equal  without edema.  Abdomen: Soft & bowel sounds normal. Non-tender w/o guarding, rebound, hernias, masses or organomegaly.  Lymphatics: Unremarkable.  Musculoskeletal: Full ROM all peripheral extremities, joint stability, 5/5 strength and normal gait.  Skin: Warm, dry without exposed rashes, lesions or ecchymosis apparent.  Neuro: Cranial nerves intact, reflexes equal bilaterally. Sensory-motor testing grossly intact. Tendon reflexes grossly intact.  Pysch: Alert &  oriented x 3.  Insight and judgement nl & appropriate. No ideations.  Assessment and Plan:  - Continue medication, monitor blood pressure at home.  - Continue DASH diet. Reminder to go to the ER if any CP,  SOB, nausea, dizziness, severe HA, changes vision/speech,  left arm numbness and tingling and jaw pain.  - Continue diet/meds, exercise,& lifestyle modifications.  - Continue monitor periodic cholesterol/liver & renal functions   - Continue diet, exercise, lifestyle modifications.  - Monitor appropriate labs. - Continue supplementation.      Discussed  regular exercise, BP monitoring, weight control to achieve/maintain BMI less than 25 and discussed med and SE's. Recommended labs to assess and monitor clinical  status with further disposition pending results of labs. Over 30 minutes of exam, counseling, chart review was performed.

## 2017-02-20 LAB — HEMOGLOBIN A1C
HEMOGLOBIN A1C: 5.8 % — AB (ref <5.7)
MEAN PLASMA GLUCOSE: 120 mg/dL

## 2017-02-20 LAB — BASIC METABOLIC PANEL WITH GFR
BUN: 18 mg/dL (ref 7–25)
CALCIUM: 9.4 mg/dL (ref 8.6–10.3)
CHLORIDE: 101 mmol/L (ref 98–110)
CO2: 25 mmol/L (ref 20–31)
CREATININE: 1.29 mg/dL (ref 0.70–1.33)
GFR, Est African American: 70 mL/min (ref >=60)
GFR, Est Non African American: 60 mL/min (ref >=60)
GLUCOSE: 95 mg/dL (ref 65–99)
Potassium: 3.9 mmol/L (ref 3.5–5.3)
Sodium: 137 mmol/L (ref 135–146)

## 2017-02-20 LAB — HEPATIC FUNCTION PANEL
ALT: 17 U/L (ref 9–46)
AST: 21 U/L (ref 10–35)
Albumin: 4.3 g/dL (ref 3.6–5.1)
Alkaline Phosphatase: 54 U/L (ref 40–115)
BILIRUBIN INDIRECT: 0.6 mg/dL (ref 0.2–1.2)
Bilirubin, Direct: 0.2 mg/dL (ref <=0.2)
TOTAL PROTEIN: 7.2 g/dL (ref 6.1–8.1)
Total Bilirubin: 0.8 mg/dL (ref 0.2–1.2)

## 2017-02-20 LAB — MAGNESIUM: MAGNESIUM: 1.8 mg/dL (ref 1.5–2.5)

## 2017-02-20 LAB — LIPID PANEL
CHOLESTEROL: 163 mg/dL (ref <200)
HDL: 43 mg/dL (ref >40)
LDL Cholesterol: 103 mg/dL — ABNORMAL HIGH (ref <100)
Total CHOL/HDL Ratio: 3.8 Ratio (ref <5.0)
Triglycerides: 84 mg/dL (ref <150)
VLDL: 17 mg/dL (ref <30)

## 2017-02-20 LAB — TESTOSTERONE: Testosterone: 395 ng/dL (ref 250–827)

## 2017-02-20 LAB — INSULIN, RANDOM: Insulin: 25.7 u[IU]/mL — ABNORMAL HIGH (ref 2.0–19.6)

## 2017-02-20 LAB — VITAMIN D 25 HYDROXY (VIT D DEFICIENCY, FRACTURES): Vit D, 25-Hydroxy: 75 ng/mL (ref 30–100)

## 2017-02-20 LAB — URIC ACID: URIC ACID, SERUM: 8.3 mg/dL — AB (ref 4.0–8.0)

## 2017-02-21 ENCOUNTER — Other Ambulatory Visit: Payer: Self-pay | Admitting: Internal Medicine

## 2017-02-21 DIAGNOSIS — M109 Gout, unspecified: Secondary | ICD-10-CM

## 2017-02-21 MED ORDER — ALLOPURINOL 300 MG PO TABS: ORAL_TABLET | ORAL | 1 refills | Status: DC

## 2017-02-23 ENCOUNTER — Other Ambulatory Visit: Payer: Self-pay | Admitting: *Deleted

## 2017-02-23 DIAGNOSIS — M109 Gout, unspecified: Secondary | ICD-10-CM

## 2017-02-23 MED ORDER — ALLOPURINOL 300 MG PO TABS: ORAL_TABLET | ORAL | 1 refills | Status: DC

## 2017-05-20 ENCOUNTER — Other Ambulatory Visit: Payer: Self-pay | Admitting: *Deleted

## 2017-05-20 MED ORDER — PHENTERMINE HCL 37.5 MG PO TABS: 37.5000 mg | ORAL_TABLET | Freq: Every day | ORAL | 5 refills | Status: DC

## 2017-06-05 NOTE — Progress Notes (Deleted)
Assessment and Plan:    Continue diet and meds as discussed. Further disposition pending results of labs. Over 30 minutes of exam, counseling, chart review, and critical decision making was performed  HPI 61 y.o. male  presents for 3 month follow up on hypertension, cholesterol, prediabetes, and vitamin D deficiency.   His blood pressure has been controlled at home, today their BP is   .  He does workout. He denies chest pain, shortness of breath, dizziness.  He is on cholesterol medication, fenofibrate and denies myalgias. His cholesterol is at goal. The cholesterol last visit was:   Lab Results  Component Value Date   CHOL 163 02/19/2017   HDL 43 02/19/2017   LDLCALC 103 (H) 02/19/2017   TRIG 84 02/19/2017   CHOLHDL 3.8 02/19/2017    He has been working on diet and exercise for prediabetes, and denies paresthesia of the feet, polydipsia, polyuria and visual disturbances. Last A1C in the office was:  Lab Results  Component Value Date   HGBA1C 5.8 (H) 02/19/2017   Patient is on Vitamin D supplement.   Lab Results  Component Value Date   VD25OH 43 02/19/2017   He has a history of testosterone deficiency and is on testosterone replacement, he was going to do the shot but would prefer to stick with the gels. He states that the testosterone helps with his energy, libido, muscle mass. Lab Results  Component Value Date   TESTOSTERONE 395 02/19/2017  BMI is There is no height or weight on file to calculate BMI., he is working on diet and exercise, he is on phentermine. Wt Readings from Last 3 Encounters:  02/19/17 295 lb (133.8 kg)  11/12/16 300 lb (136.1 kg)  09/11/16 (!) 300 lb 3.2 oz (136.2 kg)     Current Medications:  Current Outpatient Prescriptions on File Prior to Visit  Medication Sig Dispense Refill  . 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 allopurinol (ZYLOPRIM) 300 MG tablet Take 1 tablet by mouth  daily to prevent gout  90 tablet 1  . ALPRAZolam (XANAX) 1 MG tablet Take 1/2 -1 tab tid 270 tablet 1  . Cyanocobalamin (VITAMIN B 12 PO) Take 1 tablet by mouth daily.      . fenofibrate 160 MG tablet Take 1 tablet (160 mg total) by mouth daily. 90 tablet 1  . fish oil-omega-3 fatty acids 1000 MG capsule Take 1 g by mouth daily.     . Flaxseed, Linseed, (FLAX SEEDS PO) Take by mouth.    . furosemide (LASIX) 20 MG tablet Take 1 tablet (20 mg total) by mouth daily. 90 tablet 1  . magnesium oxide (MAG-OX) 400 MG tablet Take 400 mg by mouth 2 (two) times daily.     . methocarbamol (ROBAXIN) 500 MG tablet Take 1 tablet by mouth at bedtime. 90 tablet 1  . montelukast (SINGULAIR) 10 MG tablet Take 1 tablet (10 mg total) by mouth at bedtime. 90 tablet 1  . Multiple Vitamin (MULTIVITAMIN WITH MINERALS) TABS tablet Take 1 tablet by mouth daily.    Marland Kitchen NEXIUM 40 MG capsule TAKE 1 CAPSULE BY MOUTH 2  TIMES DAILY BEFORE MEALS. 180 capsule 1  . phentermine (ADIPEX-P) 37.5 MG tablet Take 1 tablet (37.5 mg total) by mouth daily before breakfast. 30 tablet 5  . potassium chloride (K-DUR) 10 MEQ tablet Take 1 tablet (10 mEq total) by mouth 3 (three) times daily. 270 tablet 1  .  sucralfate (CARAFATE) 1 g tablet Take 1 tablet by mouth 4  times daily before meals  and at bedtime 360 tablet 1  . testosterone (TESTIM) 50 MG/5GM (1%) GEL Place 5 g onto the skin daily. 90 Tube 1  . valsartan-hydrochlorothiazide (DIOVAN-HCT) 160-25 MG tablet TAKE 1 TABLET BY MOUTH  DAILY FOR BLOOD PRESSURE 90 tablet 1  . vitamin C (ASCORBIC ACID) 500 MG tablet Take 500 mg by mouth daily.     . Vitamin D, Ergocalciferol, (DRISDOL) 50000 units CAPS capsule TAKE 1 CAPSULE BY MOUTH  DAILY 90 capsule 1   No current facility-administered medications on file prior to visit.    Medical History:  Past Medical History:  Diagnosis Date  . Allergy   . Anxiety   . Barrett's esophagus   . GERD (gastroesophageal reflux disease)   . Gout   . H/O hiatal hernia   .  Hyperlipidemia   . Hypertension   . Prediabetes   . Vitamin D deficiency    Allergies:  Allergies  Allergen Reactions  . Hyzaar [Losartan Potassium-Hctz]     Pt doesn't like this medicine for his Bp- it didn't work- no allergy to this med     Review of Systems:  Review of Systems  Constitutional: Negative.   HENT: Negative.   Respiratory: Negative.   Cardiovascular: Negative for chest pain, palpitations, orthopnea, claudication, leg swelling and PND.  Gastrointestinal: Negative.   Genitourinary: Negative.   Musculoskeletal: Negative for back pain, falls, joint pain, myalgias and neck pain.  Skin: Negative.   Neurological: Negative.   Psychiatric/Behavioral: Negative.     Family history- Review and unchanged Social history- Review and unchanged Physical Exam: There were no vitals taken for this visit. Wt Readings from Last 3 Encounters:  02/19/17 295 lb (133.8 kg)  11/12/16 300 lb (136.1 kg)  09/11/16 (!) 300 lb 3.2 oz (136.2 kg)   General Appearance: Well nourished, in no apparent distress. Eyes: PERRLA, EOMs, conjunctiva no swelling or erythema Sinuses: No Frontal/maxillary tenderness ENT/Mouth: Ext aud canals clear, TMs without erythema, bulging. No erythema, swelling, or exudate on post pharynx.  Tonsils not swollen or erythematous. Hearing normal.  Neck: Supple, thyroid normal.  Respiratory: Respiratory effort normal, BS equal bilaterally without rales, rhonchi, wheezing or stridor.  Cardio: RRR with no MRGs. Brisk peripheral pulses with 2+  Edema, in compression stockings.  Abdomen: Soft, + BS, obese  Non tender, no guarding, rebound, hernias, masses. Lymphatics: Non tender without lymphadenopathy.  Musculoskeletal: Full ROM, 5/5 strength, normal gait.  Skin: Warm, dry without rashes, lesions, ecchymosis.  Neuro: Cranial nerves intact. Normal muscle tone, no cerebellar symptoms.  Psych: Awake and oriented X 3, normal affect, Insight and Judgment appropriate.     Vicie Mutters, PA-C 8:06 AM [facility]

## 2017-06-08 ENCOUNTER — Ambulatory Visit: Payer: Self-pay | Admitting: Physician Assistant

## 2017-06-22 ENCOUNTER — Ambulatory Visit: Payer: Self-pay | Admitting: Internal Medicine

## 2017-06-22 NOTE — Progress Notes (Signed)
NO SHOW

## 2017-06-23 ENCOUNTER — Other Ambulatory Visit: Payer: Self-pay | Admitting: Internal Medicine

## 2017-07-02 ENCOUNTER — Encounter: Payer: Self-pay | Admitting: Internal Medicine

## 2017-07-02 ENCOUNTER — Ambulatory Visit (INDEPENDENT_AMBULATORY_CARE_PROVIDER_SITE_OTHER): Payer: 59 | Admitting: Internal Medicine

## 2017-07-02 VITALS — BP 140/84 | HR 92 | Temp 97.5°F | Resp 18 | Ht 71.5 in | Wt 302.0 lb

## 2017-07-02 DIAGNOSIS — K219 Gastro-esophageal reflux disease without esophagitis: Secondary | ICD-10-CM

## 2017-07-02 DIAGNOSIS — Z23 Encounter for immunization: Secondary | ICD-10-CM | POA: Diagnosis not present

## 2017-07-02 DIAGNOSIS — I10 Essential (primary) hypertension: Secondary | ICD-10-CM | POA: Diagnosis not present

## 2017-07-02 DIAGNOSIS — E559 Vitamin D deficiency, unspecified: Secondary | ICD-10-CM

## 2017-07-02 DIAGNOSIS — E782 Mixed hyperlipidemia: Secondary | ICD-10-CM

## 2017-07-02 DIAGNOSIS — M1000 Idiopathic gout, unspecified site: Secondary | ICD-10-CM

## 2017-07-02 DIAGNOSIS — R7303 Prediabetes: Secondary | ICD-10-CM | POA: Diagnosis not present

## 2017-07-02 NOTE — Patient Instructions (Signed)

## 2017-07-02 NOTE — Progress Notes (Signed)
[name] presents for 6 month follow up with Hypertension, Hyperlipidemia, Pre-Diabetes and Vitamin D Deficiency. Patient has Gout quiescent on his Allopurinol. His GERD likewise is controlled on his Nexium.      Patient is treated for HTN circa 2005 & BP has been controlled at home. Today's BP is at goal -  140/84. Patient has had no complaints of any cardiac type chest pain, palpitations, dyspnea / orthopnea / PND, dizziness, claudication, or dependent edema.     Hyperlipidemia is almost at goal - controlled with diet & meds. Patient denies myalgias or other med SE's. Last Lipids were not at goal: Lab Results  Component Value Date   CHOL 163 02/19/2017   HDL 43 02/19/2017   LDLCALC 103 (H) 02/19/2017   TRIG 84 02/19/2017   CHOLHDL 3.8 02/19/2017      Also, the patient has history of  Morbid Obesity (BMI 41+) and consequent PreDiabetes  (A1c 5.9% / 2010,  6.1% / 2012 and 5.8% / 2016) and has had no symptoms of reactive hypoglycemia, diabetic polys, paresthesias or visual blurring.  Last A1c was still not at goal: Lab Results  Component Value Date   HGBA1C 5.8 (H) 02/19/2017      Patient has hx/ o Low T (Level 228 in 2001) and he has deferred replacement therapy. Further, the patient also has history of Vitamin D Deficiency  Of "17" in 2008 and supplements vitamin D without any suspected side-effects. Last vitamin D was at goal: Lab Results  Component Value Date   VD25OH 22 02/19/2017   Current Outpatient Prescriptions on File Prior to Visit  Medication Sig  . 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 allopurinol (ZYLOPRIM) 300 MG tablet Take 1 tablet by mouth  daily to prevent gout  . ALPRAZolam (XANAX) 1 MG tablet Take 1/2 -1 tab tid  . Cyanocobalamin (VITAMIN B 12 PO) Take 1 tablet by mouth daily.    . fenofibrate 160 MG tablet Take 1 tablet (160 mg total) by mouth daily.  . fish oil-omega-3 fatty acids 1000 MG capsule Take 1  g by mouth daily.   . Flaxseed, Linseed, (FLAX SEEDS PO) Take by mouth.  . furosemide (LASIX) 20 MG tablet Take 1 tablet (20 mg total) by mouth daily.  . magnesium oxide (MAG-OX) 400 MG tablet Take 400 mg by mouth 2 (two) times daily.   . methocarbamol (ROBAXIN) 500 MG tablet Take 1 tablet by mouth at bedtime.  . montelukast (SINGULAIR) 10 MG tablet TAKE 1 TABLET BY MOUTH AT  BEDTIME  . Multiple Vitamin (MULTIVITAMIN WITH MINERALS) TABS tablet Take 1 tablet by mouth daily.  Marland Kitchen NEXIUM 40 MG capsule Take 1 capsule daily for Acid indigestion & Reflux (Patient taking differently: Take 2 capsule daily for Acid indigestion & Reflux)  . potassium chloride (K-DUR,KLOR-CON) 10 MEQ tablet TAKE 1 TABLET BY MOUTH 3  TIMES DAILY  . sucralfate (CARAFATE) 1 g tablet Take 1 tablet by mouth 4  times daily before meals  and at bedtime  . testosterone (TESTIM) 50 MG/5GM (1%) GEL Place 5 g onto the skin daily.  . valsartan-hydrochlorothiazide (DIOVAN-HCT) 160-25 MG tablet TAKE 1 TABLET BY MOUTH  DAILY FOR BLOOD PRESSURE  . vitamin C (ASCORBIC ACID) 500 MG tablet Take 500 mg by mouth daily.   . Vitamin D, Ergocalciferol, (DRISDOL) 50000 units CAPS capsule TAKE 1 CAPSULE BY MOUTH  DAILY  .  phentermine (ADIPEX-P) 37.5 MG tablet Take 1 tablet (37.5 mg total) by mouth daily before breakfast. (Patient not taking: Reported on 07/02/2017)   No current facility-administered medications on file prior to visit.    Allergies  Allergen Reactions  . Hyzaar [Losartan Potassium-Hctz]     Pt doesn't like this medicine for his Bp- it didn't work- no allergy to this med   PMHx:   Past Medical History:  Diagnosis Date  . Allergy   . Anxiety   . Barrett's esophagus   . GERD (gastroesophageal reflux disease)   . Gout   . H/O hiatal hernia   . Hyperlipidemia   . Hypertension   . Prediabetes   . Vitamin D deficiency    Immunization History  Administered Date(s) Administered  . Influenza Inj Mdck Quad With Preservative  07/02/2017  . Influenza Split 06/28/2014, 06/28/2015  . Influenza,inj,quad, With Preservative 08/11/2016  . Influenza-Unspecified 09/01/2012  . PPD Test 06/28/2014, 08/11/2016  . Pneumococcal-Unspecified 09/01/1998  . Tdap 08/01/2013   Past Surgical History:  Procedure Laterality Date  . CLOSED REDUCTION NASAL FRACTURE N/A 06/10/2016   Procedure: CLOSED REDUCTION NASAL FRACTURE FOREHEAD SUTURE REMOVAL;  Surgeon: [name], DO;  Location: [place];  Service: Plastics;  Laterality: N/A;  . COLONOSCOPY    . [place]  . Left knee Arthroscopy    . Right knee arthroscopy    . UPPER GASTROINTESTINAL ENDOSCOPY     last egd 12-7*2012   FHx:    Reviewed / unchanged  SHx:    Reviewed / unchanged  Systems Review:  Constitutional: Denies fever, chills, wt changes, headaches, insomnia, fatigue, night sweats, change in appetite. Eyes: Denies redness, blurred vision, diplopia, discharge, itchy, watery eyes.  ENT: Denies discharge, congestion, post nasal drip, epistaxis, sore throat, earache, hearing loss, dental pain, tinnitus, vertigo, sinus pain, snoring.  CV: Denies chest pain, palpitations, irregular heartbeat, syncope, dyspnea, diaphoresis, orthopnea, PND, claudication or edema. Respiratory: denies cough, dyspnea, DOE, pleurisy, hoarseness, laryngitis, wheezing.  Gastrointestinal: Denies dysphagia, odynophagia, heartburn, reflux, water brash, abdominal pain or cramps, nausea, vomiting, bloating, diarrhea, constipation, hematemesis, melena, hematochezia  or hemorrhoids. Genitourinary: Denies dysuria, frequency, urgency, nocturia, hesitancy, discharge, hematuria or flank pain. Musculoskeletal: Denies arthralgias, myalgias, stiffness, jt. swelling, pain, limping or strain/sprain.  Skin: Denies pruritus, rash, hives, warts, acne, eczema or change in skin lesion(s). Neuro: No weakness, tremor, incoordination, spasms, paresthesia or pain. Psychiatric:  Denies confusion, memory loss or sensory loss. Endo: Denies change in weight, skin or hair change.  Heme/Lymph: No excessive bleeding, bruising or enlarged lymph nodes.  Physical Exam  BP 140/84   Pulse 92   Temp (!) 97.5 F (36.4 C)   Resp 18   Ht 5' 11.5" (1.816 m)   Wt (!) 302 lb (137 kg)   BMI 41.53 kg/m   Appears well nourished, well groomed  and in no distress.  Eyes: PERRLA, EOMs, conjunctiva no swelling or erythema. Sinuses: No frontal/maxillary tenderness ENT/Mouth: EAC's clear, TM's nl w/o erythema, bulging. Nares clear w/o erythema, swelling, exudates. Oropharynx clear without erythema or exudates. Oral hygiene is good. Tongue normal, non obstructing. Hearing intact.  Neck: Supple. Thyroid nl. Car 2+/2+ without bruits, nodes or JVD. Chest: Respirations nl with BS clear & equal w/o rales, rhonchi, wheezing or stridor.  Cor: Heart sounds normal w/ regular rate and rhythm without sig. murmurs, gallops, clicks or rubs. Peripheral pulses normal and equal  without edema.  Abdomen: Soft & bowel sounds normal. Non-tender w/o  guarding, rebound, hernias, masses or organomegaly.  Lymphatics: Unremarkable.  Musculoskeletal: Full ROM all peripheral extremities, joint stability, 5/5 strength and normal gait.  Skin: Warm, dry without exposed rashes, lesions or ecchymosis apparent.  Neuro: Cranial nerves intact, reflexes equal bilaterally. Sensory-motor testing grossly intact. Tendon reflexes grossly intact.  Pysch: Alert & oriented x 3.  Insight and judgement nl & appropriate. No ideations.  Assessment and Plan:  1. Essential hypertension  - Continue medication, monitor blood pressure at home.  - Continue DASH diet. Reminder to go to the ER if any CP,  SOB, nausea, dizziness, severe HA, changes vision/speech. - CBC with Differential/Platelet - BASIC METABOLIC PANEL WITH GFR - Magnesium - TSH  2. Hyperlipidemia, mixed  - Continue diet/meds, exercise,& lifestyle modifications.   - Continue monitor periodic cholesterol/liver & renal functions  - Hepatic function panel - Lipid panel - TSH  3. Prediabetes  - Hemoglobin A1c - Insulin, random  4. Vitamin D deficiency  - Continue diet, exercise, lifestyle modifications.  - Monitor appropriate labs. - Continue supplementation. - VITAMIN D 25 Hydroxy  5. Idiopathic gout  - Uric acid  6. Gastroesophageal reflux disease  - CBC with Differential/Platelet - BASIC METABOLIC PANEL WITH GFR - Hepatic function panel - Magnesium - Lipid panel - TSH - Hemoglobin A1c - Insulin, random - VITAMIN D 25 Hydroxy  - Uric acid  7. Need for immunization against influenza  - FLU VACCINE MDCK QUAD W/Preservative        Discussed  regular exercise, BP monitoring, weight control to achieve/maintain BMI less than 25 and discussed med and SE's. Recommended labs to assess and monitor clinical status with further disposition pending results of labs. Over 30 minutes of exam, counseling, chart review was performed.

## 2017-07-03 LAB — INSULIN, RANDOM: INSULIN: 8.6 u[IU]/mL (ref 2.0–19.6)

## 2017-07-03 LAB — CBC WITH DIFFERENTIAL/PLATELET
BASOS PCT: 0.4 %
Basophils Absolute: 29 cells/uL (ref 0–200)
EOS ABS: 139 {cells}/uL (ref 15–500)
Eosinophils Relative: 1.9 %
HEMATOCRIT: 42.8 % (ref 38.5–50.0)
HEMOGLOBIN: 14.9 g/dL (ref 13.2–17.1)
LYMPHS ABS: 2562 {cells}/uL (ref 850–3900)
MCH: 29.4 pg (ref 27.0–33.0)
MCHC: 34.8 g/dL (ref 32.0–36.0)
MCV: 84.6 fL (ref 80.0–100.0)
MPV: 11.2 fL (ref 7.5–12.5)
Monocytes Relative: 11.1 %
NEUTROS ABS: 3760 {cells}/uL (ref 1500–7800)
Neutrophils Relative %: 51.5 %
Platelets: 159 10*3/uL (ref 140–400)
RBC: 5.06 10*6/uL (ref 4.20–5.80)
RDW: 14.4 % (ref 11.0–15.0)
Total Lymphocyte: 35.1 %
WBC: 7.3 10*3/uL (ref 3.8–10.8)
WBCMIX: 810 {cells}/uL (ref 200–950)

## 2017-07-03 LAB — LIPID PANEL
CHOL/HDL RATIO: 4.3 (calc) (ref <5.0)
CHOLESTEROL: 196 mg/dL (ref <200)
HDL: 46 mg/dL (ref >40)
LDL CHOLESTEROL (CALC): 130 mg/dL — AB
NON-HDL CHOLESTEROL (CALC): 150 mg/dL — AB (ref <130)
Triglycerides: 94 mg/dL (ref <150)

## 2017-07-03 LAB — BASIC METABOLIC PANEL WITH GFR
BUN: 12 mg/dL (ref 7–25)
CO2: 28 mmol/L (ref 20–32)
CREATININE: 1.2 mg/dL (ref 0.70–1.33)
Calcium: 10.1 mg/dL (ref 8.6–10.3)
Chloride: 101 mmol/L (ref 98–110)
GFR, EST NON AFRICAN AMERICAN: 66 mL/min/{1.73_m2} (ref >=60)
GFR, Est African American: 76 mL/min/{1.73_m2} (ref >=60)
Glucose, Bld: 81 mg/dL (ref 65–99)
Potassium: 4 mmol/L (ref 3.5–5.3)
SODIUM: 137 mmol/L (ref 135–146)

## 2017-07-03 LAB — HEMOGLOBIN A1C
Hgb A1c MFr Bld: 5.7 % of total Hgb — ABNORMAL HIGH (ref <5.7)
Mean Plasma Glucose: 117 (calc)
eAG (mmol/L): 6.5 (calc)

## 2017-07-03 LAB — HEPATIC FUNCTION PANEL
AG RATIO: 1.4 (calc) (ref 1.0–2.5)
ALKALINE PHOSPHATASE (APISO): 64 U/L (ref 40–115)
ALT: 15 U/L (ref 9–46)
AST: 19 U/L (ref 10–35)
Albumin: 4.4 g/dL (ref 3.6–5.1)
BILIRUBIN TOTAL: 0.7 mg/dL (ref 0.2–1.2)
Bilirubin, Direct: 0.2 mg/dL (ref 0.0–0.2)
GLOBULIN: 3.1 g/dL (ref 1.9–3.7)
Indirect Bilirubin: 0.5 mg/dL (calc) (ref 0.2–1.2)
TOTAL PROTEIN: 7.5 g/dL (ref 6.1–8.1)

## 2017-07-03 LAB — MAGNESIUM: Magnesium: 1.8 mg/dL (ref 1.5–2.5)

## 2017-07-03 LAB — VITAMIN D 25 HYDROXY (VIT D DEFICIENCY, FRACTURES): VIT D 25 HYDROXY: 81 ng/mL (ref 30–100)

## 2017-07-03 LAB — URIC ACID: Uric Acid, Serum: 6.9 mg/dL (ref 4.0–8.0)

## 2017-07-03 LAB — TSH: TSH: 2.06 mIU/L (ref 0.40–4.50)

## 2017-08-16 ENCOUNTER — Other Ambulatory Visit: Payer: Self-pay | Admitting: Internal Medicine

## 2017-08-17 ENCOUNTER — Telehealth: Payer: Self-pay

## 2017-08-17 NOTE — Telephone Encounter (Signed)
Refill request for Flexeril tablets, 10mg , Take one-half to one tablet by mouth three times daily for muscle spasms. #90 Last fill date:04/08/14  Please advise about filling.

## 2017-08-18 MED ORDER — CYCLOBENZAPRINE HCL 10 MG PO TABS: ORAL_TABLET | ORAL | 0 refills | Status: DC

## 2017-08-18 NOTE — Addendum Note (Signed)
Addended by: Chancy Hurter on: 08/18/2017 08:56 AM   Modules accepted: Orders

## 2017-09-01 ENCOUNTER — Encounter: Payer: Self-pay | Admitting: Gastroenterology

## 2017-09-03 ENCOUNTER — Telehealth: Payer: Self-pay | Admitting: *Deleted

## 2017-09-03 NOTE — Telephone Encounter (Signed)
Patient called and reported he only received Nexium 40 mg daily, instead of twice a day. Per Dr Melford Aase, he will need to see his GI doctor to be evaluated, since his last EGD showed Barrett's esophagus.  Per Dr Melford Aase, he should take Nexium 40 mg in the AM and Ranitidine OTC 150 mg 1-2 tablets in the PM.  The patient will be referred to GI, also.

## 2017-09-07 ENCOUNTER — Encounter: Payer: Self-pay | Admitting: Gastroenterology

## 2017-09-08 ENCOUNTER — Encounter: Payer: Self-pay | Admitting: Internal Medicine

## 2017-09-18 DIAGNOSIS — H6091 Unspecified otitis externa, right ear: Secondary | ICD-10-CM | POA: Diagnosis not present

## 2017-09-22 ENCOUNTER — Other Ambulatory Visit: Payer: Self-pay

## 2017-09-22 ENCOUNTER — Ambulatory Visit (AMBULATORY_SURGERY_CENTER): Payer: Self-pay

## 2017-09-22 VITALS — Ht 72.0 in | Wt 307.0 lb

## 2017-09-22 DIAGNOSIS — K2270 Barrett's esophagus without dysplasia: Secondary | ICD-10-CM

## 2017-09-22 NOTE — Progress Notes (Signed)
Denies allergies to eggs or soy products. Denies complication of anesthesia or sedation. Denies use of weight loss medication. Denies use of O2.   Emmi instructions declined.   Patient verbalizes understanding to stop taking Phentermine 10 day prior to his endoscopy. Last dose should be 09/25/17. Patient states he will not take any more Phentermine until after his procedure and as directed by your physician.   Riki Sheer, LPN

## 2017-09-23 ENCOUNTER — Encounter: Payer: Self-pay | Admitting: Gastroenterology

## 2017-10-06 ENCOUNTER — Encounter: Payer: Self-pay | Admitting: Gastroenterology

## 2017-10-06 ENCOUNTER — Ambulatory Visit (AMBULATORY_SURGERY_CENTER): Payer: 59 | Admitting: Gastroenterology

## 2017-10-06 ENCOUNTER — Other Ambulatory Visit: Payer: Self-pay

## 2017-10-06 VITALS — BP 129/78 | HR 80 | Temp 96.6°F | Resp 15 | Ht 72.0 in | Wt 307.0 lb

## 2017-10-06 DIAGNOSIS — K2270 Barrett's esophagus without dysplasia: Secondary | ICD-10-CM | POA: Diagnosis present

## 2017-10-06 MED ORDER — SODIUM CHLORIDE 0.9 % IV SOLN: 500.0000 mL | Freq: Once | INTRAVENOUS | Status: DC

## 2017-10-06 NOTE — Op Note (Signed)
Christopher [name]: Christopher [name] Procedure Date: 10/06/2017 9:23 AM MRN: [id number] Endoscopist: Mallie Mussel L. Loletha [name] , MD Age: 62 Referring MD:  Date of Birth: 01/16/1956 Gender: Male Account #: [id number] Procedure:                Upper GI endoscopy Indications:              Surveillance for malignancy due to personal history                            of Barrett's esophagus (last EGD 08/2014) Medicines:                Monitored Anesthesia Care Procedure:                Pre-Anesthesia Assessment:                           - Prior to the procedure, a History and Physical                            was performed, and patient medications and                            allergies were reviewed. The patient's tolerance of                            previous anesthesia was also reviewed. The risks                            and benefits of the procedure and the sedation                            options and risks were discussed with the patient.                            All questions were answered, and informed consent                            was obtained. Anticoagulants: The patient has taken                            aspirin. It was decided not to withhold this                            medication prior to the procedure. ASA Grade                            Assessment: II - A patient with mild systemic                            disease. After reviewing the risks and benefits,                            the patient was deemed in satisfactory condition to  undergo the procedure.                           After obtaining informed consent, the endoscope was                            passed under direct vision. Throughout the                            procedure, the patient's blood pressure, pulse, and                            oxygen saturations were monitored continuously. The                            Endoscope was introduced through the  mouth, and                            advanced to the second part of duodenum. The upper                            GI endoscopy was accomplished without difficulty.                            The patient tolerated the procedure well. Scope In: Scope Out: Findings:                 The larynx was normal.                           There were esophageal mucosal changes secondary to                            established short-segment Barrett's disease present                            in the distal esophagus (several small islands and                            two short tongues of salmon-colord mucosa above the                            gastric folds). The maximum longitudinal extent of                            these mucosal changes was 2 cm in length. There                            were no raised or suspicious areas seen. Mucosa was                            biopsied with a cold forceps for histology. One  specimen bottle was sent to pathology.                           A small hiatal hernia was present.                           The stomach was normal.                           The cardia and gastric fundus were normal on                            retroflexion.                           The examined duodenum was normal. Complications:            No immediate complications. Estimated Blood Loss:     Estimated blood loss was minimal. Impression:               - Normal larynx.                           - Esophageal mucosal changes secondary to                            established short-segment Barrett's disease.                            Biopsied.                           - Small hiatal hernia.                           - Normal stomach.                           - Normal examined duodenum. Recommendation:           - Patient has a contact number available for                            emergencies. The signs and symptoms of potential                             delayed complications were discussed with the                            patient. Return to normal activities tomorrow.                            Written discharge instructions were provided to the                            patient.                           - Resume previous diet.                           -  Continue present medications.                           - Await pathology results.                           - Repeat upper endoscopy for surveillance based on                            pathology results. Christopher Creamer L. Loletha Carrow, MD 10/06/2017 9:39:48 AM This report has been signed electronically.

## 2017-10-06 NOTE — Progress Notes (Signed)
Spontaneous respirations throughout. VSS. Resting comfortably. To PACU on room air. Report to  RN. 

## 2017-10-06 NOTE — Progress Notes (Signed)
Called to room to assist during endoscopic procedure.  Patient ID and intended procedure confirmed with present staff. Received instructions for my participation in the procedure from the performing physician.  

## 2017-10-06 NOTE — Patient Instructions (Signed)
YOU HAD AN ENDOSCOPIC PROCEDURE TODAY AT [facility]:   Refer to the procedure report that was given to you for any specific questions about what was found during the examination.  If the procedure report does not answer your questions, please call your gastroenterologist to clarify.  If you requested that your care partner not be given the details of your procedure findings, then the procedure report has been included in a sealed envelope for you to review at your convenience later.  YOU SHOULD EXPECT: Some feelings of bloating in the abdomen. Passage of more gas than usual.  Walking can help get rid of the air that was put into your GI tract during the procedure and reduce the bloating. If you had a lower endoscopy (such as a colonoscopy or flexible sigmoidoscopy) you may notice spotting of blood in your stool or on the toilet paper. If you underwent a bowel prep for your procedure, you may not have a normal bowel movement for a few days.  Please Note:  You might notice some irritation and congestion in your nose or some drainage.  This is from the oxygen used during your procedure.  There is no need for concern and it should clear up in a day or so.  SYMPTOMS TO REPORT IMMEDIATELY:   Following upper endoscopy (EGD)  Vomiting of blood or coffee ground material  New chest pain or pain under the shoulder blades  Painful or persistently difficult swallowing  New shortness of breath  Fever of 100F or higher  Black, tarry-looking stools  For urgent or emergent issues, a gastroenterologist can be reached at any hour by calling [phone].   DIET:  We do recommend a small meal at first, but then you may proceed to your regular diet.  Drink plenty of fluids but you should avoid alcoholic beverages for 24 hours.  ACTIVITY:  You should plan to take it easy for the rest of today and you should NOT DRIVE or use heavy machinery until tomorrow (because of the sedation medicines used  during the test).    FOLLOW UP: Our staff will call the number listed on your records the next business day following your procedure to check on you and address any questions or concerns that you may have regarding the information given to you following your procedure. If we do not reach you, we will leave a message.  However, if you are feeling well and you are not experiencing any problems, there is no need to return our call.  We will assume that you have returned to your regular daily activities without incident.  If any biopsies were taken you will be contacted by phone or by letter within the next 1-3 weeks.  Please call us at [phone] if you have not heard about the biopsies in 3 weeks.    Await for biopsy results Barrett's Esophagus (handout given) Hiatal hernia (handout given)  SIGNATURES/CONFIDENTIALITY: You and/or your care partner have signed paperwork which will be entered into your electronic medical record.  These signatures attest to the fact that that the information above on your After Visit Summary has been reviewed and is understood.  Full responsibility of the confidentiality of this discharge information lies with you and/or your care-partner.

## 2017-10-07 ENCOUNTER — Telehealth: Payer: Self-pay | Admitting: *Deleted

## 2017-10-07 NOTE — Telephone Encounter (Signed)
  Follow up Call-  Call back number 10/06/2017  Post procedure Call Back phone  # [phone]  Permission to leave phone message Yes  Some recent data might be hidden     Patient questions:  Do you have a fever, pain , or abdominal swelling? No. Pain Score  0 *  Have you tolerated food without any problems? Yes.    Have you been able to return to your normal activities? Yes.    Do you have any questions about your discharge instructions: Diet   No. Medications  No. Follow up visit  No.  Do you have questions or concerns about your Care? No.  Actions: * If pain score is 4 or above: No action needed, pain <4.

## 2017-10-09 ENCOUNTER — Other Ambulatory Visit: Payer: Self-pay | Admitting: Internal Medicine

## 2017-10-09 DIAGNOSIS — M109 Gout, unspecified: Secondary | ICD-10-CM

## 2017-10-12 ENCOUNTER — Encounter: Payer: Self-pay | Admitting: Gastroenterology

## 2017-10-12 ENCOUNTER — Ambulatory Visit: Payer: 59 | Admitting: Internal Medicine

## 2017-10-12 ENCOUNTER — Encounter: Payer: Self-pay | Admitting: Internal Medicine

## 2017-10-12 ENCOUNTER — Other Ambulatory Visit: Payer: Self-pay | Admitting: Internal Medicine

## 2017-10-12 VITALS — BP 138/84 | HR 96 | Temp 97.7°F | Resp 18 | Ht 72.0 in | Wt 306.2 lb

## 2017-10-12 DIAGNOSIS — N183 Chronic kidney disease, stage 3 (moderate): Secondary | ICD-10-CM

## 2017-10-12 DIAGNOSIS — Z125 Encounter for screening for malignant neoplasm of prostate: Secondary | ICD-10-CM

## 2017-10-12 DIAGNOSIS — Z0001 Encounter for general adult medical examination with abnormal findings: Secondary | ICD-10-CM

## 2017-10-12 DIAGNOSIS — Z1212 Encounter for screening for malignant neoplasm of rectum: Secondary | ICD-10-CM

## 2017-10-12 DIAGNOSIS — N1831 Chronic kidney disease, stage 3a: Secondary | ICD-10-CM

## 2017-10-12 DIAGNOSIS — Z79899 Other long term (current) drug therapy: Secondary | ICD-10-CM

## 2017-10-12 DIAGNOSIS — K219 Gastro-esophageal reflux disease without esophagitis: Secondary | ICD-10-CM

## 2017-10-12 DIAGNOSIS — Z111 Encounter for screening for respiratory tuberculosis: Secondary | ICD-10-CM

## 2017-10-12 DIAGNOSIS — I10 Essential (primary) hypertension: Secondary | ICD-10-CM | POA: Diagnosis not present

## 2017-10-12 DIAGNOSIS — M1000 Idiopathic gout, unspecified site: Secondary | ICD-10-CM

## 2017-10-12 DIAGNOSIS — E782 Mixed hyperlipidemia: Secondary | ICD-10-CM

## 2017-10-12 DIAGNOSIS — R5383 Other fatigue: Secondary | ICD-10-CM

## 2017-10-12 DIAGNOSIS — R7303 Prediabetes: Secondary | ICD-10-CM

## 2017-10-12 DIAGNOSIS — Z87891 Personal history of nicotine dependence: Secondary | ICD-10-CM

## 2017-10-12 DIAGNOSIS — Z1211 Encounter for screening for malignant neoplasm of colon: Secondary | ICD-10-CM

## 2017-10-12 DIAGNOSIS — Z0000 Encounter for general adult medical examination without abnormal findings: Secondary | ICD-10-CM

## 2017-10-12 DIAGNOSIS — Z136 Encounter for screening for cardiovascular disorders: Secondary | ICD-10-CM

## 2017-10-12 DIAGNOSIS — E559 Vitamin D deficiency, unspecified: Secondary | ICD-10-CM

## 2017-10-12 DIAGNOSIS — Z8249 Family history of ischemic heart disease and other diseases of the circulatory system: Secondary | ICD-10-CM

## 2017-10-12 DIAGNOSIS — E349 Endocrine disorder, unspecified: Secondary | ICD-10-CM

## 2017-10-12 MED ORDER — ESOMEPRAZOLE MAGNESIUM 40 MG PO CPDR: DELAYED_RELEASE_CAPSULE | ORAL | 3 refills | Status: DC

## 2017-10-12 NOTE — Progress Notes (Signed)
[facility]   Unk Pinto, M.D.     Uvaldo Bristle. Silverio Lay, P.A.-C Liane Comber, [place]                [address] [place] [address] Telephone [phone] Telefax [phone] Annual  Screening/Preventative Visit  & Comprehensive Evaluation & Examination     This very nice 62 y.o. [name] presents for a Screening/Preventative Visit & comprehensive evaluation and management of multiple medical co-morbidities.  Patient has been followed for HTN, Prediabetes, Hyperlipidemia and Vitamin D Deficiency. Patient has hx/o hyperuricemia controlled on Allopurinol. Also , he has GERD controlled w/Nexium, but recently had EGD by Dr Loletha Carrow and was Bx(+) for Barrett's and is advised to increase Nexium to bid and repeat surveillance EGD in 6 months.      HTN predates since 2005. Patient's BP has been controlled at home.  Today's BP was initially elevated and rechecked at goal - 138/84. Patient denies any cardiac symptoms as chest pain, palpitations, shortness of breath, dizziness or ankle swelling.     Patient's hyperlipidemia is near controlled with diet and medications. Patient denies myalgias or other medication SE's. Last lipids were a;lmost to goal:  Lab Results  Component Value Date   CHOL 196 07/02/2017   HDL 46 07/02/2017   LDLCALC 103 (H) 02/19/2017   TRIG 94 07/02/2017   CHOLHDL 4.3 07/02/2017      Patient has Gluttony with compulsive overeating and consequent Morbid Obesity (BMI 41+) also with  prediabetes (A1c 5.9%/2010, 6.1%/2012 and 5.8%/2016) and patient denies reactive hypoglycemic symptoms, visual blurring, diabetic polys or paresthesias. Last A1c was near goal: Lab Results  Component Value Date   HGBA1C 5.7 (H) 07/02/2017        Patient has hx/o Low Testosterone of 228 in 2001 and deferred replacement therapy. Finally, patient has history of Vitamin D Deficiency ("17"/2008) and last  vitamin D was at goal: Lab Results  Component Value Date   VD25OH 81 07/02/2017   Current Outpatient Medications on File Prior to Visit  Medication Sig  . 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 allopurinol (ZYLOPRIM) 300 MG tablet TAKE 1 TABLET BY MOUTH  DAILY FOR GOUT PREVENTION  . ALPRAZolam (XANAX) 1 MG tablet Take 1/2 to 1 tablet 2 to 3 x / day ONLY if needed for Anxiety Attack and please try to limit to 5 days /week to avoid addiction  . aspirin EC 81 MG tablet Take 81 mg by mouth daily.  Marland Kitchen buPROPion (WELLBUTRIN XL) 300 MG 24 hr tablet TAKE 1 TABLET BY MOUTH  EVERY MORNING  . Cyanocobalamin (VITAMIN B 12 PO) Take 1 tablet by mouth daily.    . fenofibrate 160 MG tablet TAKE 1 TABLET BY MOUTH  DAILY  . fish oil-omega-3 fatty acids 1000 MG capsule Take 1 g by mouth daily.   . Flaxseed, Linseed, (FLAX SEEDS PO) Take by mouth.  . furosemide (LASIX) 20 MG tablet TAKE 1 TABLET BY MOUTH  DAILY  . magnesium oxide (MAG-OX) 400 MG tablet Take 400 mg by mouth 2 (two) times daily.   . methocarbamol (ROBAXIN) 500 MG tablet TAKE 1 TABLET BY MOUTH AT  BEDTIME  . montelukast (SINGULAIR) 10 MG tablet TAKE 1 TABLET BY MOUTH AT  BEDTIME  . Multiple Vitamin (MULTIVITAMIN WITH MINERALS) TABS tablet  Take 1 tablet by mouth daily.  . phentermine (ADIPEX-P) 37.5 MG tablet Take 1 tablet (37.5 mg total) by mouth daily before breakfast.  . potassium chloride (K-DUR,KLOR-CON) 10 MEQ tablet TAKE 1 TABLET BY MOUTH 3  TIMES DAILY  . TESTIM 50 MG/5GM (1%) GEL APPLY CONTENTS OF 1 TUBE  TOPICALLY DAILY  . valsartan-hydrochlorothiazide (DIOVAN-HCT) 160-25 MG tablet TAKE 1 TABLET BY MOUTH  DAILY FOR BLOOD PRESSURE  . vitamin C (ASCORBIC ACID) 500 MG tablet Take 500 mg by mouth daily.    Current Facility-Administered Medications on File Prior to Visit  Medication  . 0.9 %  sodium chloride infusion   No Known Allergies Past Medical History:  Diagnosis Date  . Allergy   . Anxiety    . Asthma   . Barrett's esophagus   . GERD (gastroesophageal reflux disease)   . Gout   . H/O hiatal hernia   . Hyperlipidemia   . Hypertension   . Prediabetes   . Sleep apnea   . Vitamin D deficiency    Health Maintenance  Topic Date Due  . Hepatitis C Screening  [birth year]  . HIV Screening  11/07/1972  . COLONOSCOPY  07/04/2019  . TETANUS/TDAP  08/02/2023  . INFLUENZA VACCINE  Completed   Immunization History  Administered Date(s) Administered  . Influenza Inj Mdck Quad With Preservative 07/02/2017  . Influenza Split 06/28/2014, 06/28/2015  . Influenza,inj,quad, With Preservative 08/11/2016  . Influenza-Unspecified 09/01/2012  . PPD Test 06/28/2014, 08/11/2016, 10/12/2017  . Pneumococcal-Unspecified 09/01/1998  . Tdap 08/01/2013   Last Colon -  Past Surgical History:  Procedure Laterality Date  . CLOSED REDUCTION NASAL FRACTURE N/A 06/10/2016   Procedure: CLOSED REDUCTION NASAL FRACTURE FOREHEAD SUTURE REMOVAL;  Surgeon: [name], DO;  Location: [facility];  Service: Plastics;  Laterality: N/A;  . COLONOSCOPY    . [place]  . Left knee Arthroscopy    . Right knee arthroscopy    . UPPER GASTROINTESTINAL ENDOSCOPY     last egd 12-7*2012   Family History  Problem Relation Age of Onset  . Arthritis Mother   . Lung cancer Mother   . Heart disease Mother   . Colon cancer Maternal Uncle   . Stroke Brother   . Prostate cancer Father   . Esophageal cancer Neg Hx   . Rectal cancer Neg Hx   . Stomach cancer Neg Hx   . Liver cancer Neg Hx   . Pancreatic cancer Neg Hx    Social History   Socioeconomic History  . Marital status: Remarried in July 2018    Spouse name: Malachy Mood  . Number of children: 1 son & 2 GrDaughters  Occupational History  . Occupation: Event organiser  Tobacco Use  . Smoking status: Former Smoker    Years: 6.00    Types: Cigarettes    Last attempt to quit: 02/07/2013    Years since quitting: 4.6  .  Smokeless tobacco: Never Used  Substance and Sexual Activity  . Alcohol use: No    Alcohol/week: 0.0 oz  . Drug use: No  . Sexual activity: Yes    Partners: Male    ROS Constitutional: Denies fever, chills, weight loss/gain, headaches, insomnia,  night sweats or change in appetite. Does c/o fatigue. Eyes: Denies redness, blurred vision, diplopia, discharge, itchy or watery eyes.  ENT: Denies discharge, congestion, post nasal drip, epistaxis, sore throat, earache, hearing loss, dental pain, Tinnitus, Vertigo, Sinus pain or snoring.  Cardio: Denies chest  pain, palpitations, irregular heartbeat, syncope, dyspnea, diaphoresis, orthopnea, PND, claudication or edema Respiratory: denies cough, dyspnea, DOE, pleurisy, hoarseness, laryngitis or wheezing.  Gastrointestinal: Denies dysphagia, heartburn, reflux, water brash, pain, cramps, nausea, vomiting, bloating, diarrhea, constipation, hematemesis, melena, hematochezia, jaundice or hemorrhoids Genitourinary: Denies dysuria, frequency, urgency, nocturia, hesitancy, discharge, hematuria or flank pain Musculoskeletal: Denies arthralgia, myalgia, stiffness, Jt. Swelling, pain, limp or strain/sprain. Denies Falls. Skin: Denies puritis, rash, hives, warts, acne, eczema or change in skin lesion Neuro: No weakness, tremor, incoordination, spasms, paresthesia or pain Psychiatric: Denies confusion, memory loss or sensory loss. Denies Depression. Endocrine: Denies change in weight, skin, hair change, nocturia, and paresthesia, diabetic polys, visual blurring or hyper / hypo glycemic episodes.  Heme/Lymph: No excessive bleeding, bruising or enlarged lymph nodes.  Physical Exam  BP 138/84   Pulse 96   Temp 97.7 F (36.5 C)   Resp 18   Ht 6' (1.829 m)   Wt (!) 306 lb 3.2 oz (138.9 kg)   BMI 41.53 kg/m   General Appearance: Over nourished and well groomed and in no apparent distress.  Eyes: PERRLA, EOMs, conjunctiva no swelling or erythema, normal  fundi and vessels. Sinuses: No frontal/maxillary tenderness ENT/Mouth: EACs patent / TMs  nl. Nares clear without erythema, swelling, mucoid exudates. Oral hygiene is good. No erythema, swelling, or exudate. Tongue normal, non-obstructing. Tonsils not swollen or erythematous. Hearing normal.  Neck: Supple, thyroid not palpable. No bruits, nodes or JVD. Respiratory: Respiratory effort normal.  BS equal and clear bilateral without rales, rhonci, wheezing or stridor. Cardio: Heart sounds are normal with regular rate and rhythm and no murmurs, rubs or gallops. Peripheral pulses are normal and equal bilaterally without edema. No aortic or femoral bruits. Chest: symmetric with normal excursions and percussion.  Abdomen: Soft, with Nl bowel sounds. Nontender, no guarding, rebound, hernias, masses, or organomegaly.  Lymphatics: Non tender without lymphadenopathy.  Genitourinary: No hernias.Testes nl. DRE - prostate nl for age - smooth & firm w/o nodules. Musculoskeletal: Full ROM all peripheral extremities, joint stability, 5/5 strength, and normal gait. Skin: Warm and dry without rashes, lesions, cyanosis, clubbing or  ecchymosis.  Neuro: Cranial nerves intact, reflexes equal bilaterally. Normal muscle tone, no cerebellar symptoms. Sensation intact.  Pysch: Alert and oriented X 3 with normal affect, insight and judgment appropriate.   Assessment and Plan  1. Annual Preventative/Screening Exam   2. Essential hypertension  - EKG 12-Lead - [place], RETROPERITNL ABD,  LTD - Urinalysis, Routine w reflex microscopic - Microalbumin / creatinine urine ratio - CBC with Differential/Platelet - BASIC METABOLIC PANEL WITH GFR - Magnesium - TSH  3. Hyperlipidemia, mixed  - EKG 12-Lead - [place], RETROPERITNL ABD,  LTD - Hepatic function panel - Lipid panel - TSH  4. Prediabetes  - EKG 12-Lead - [place], RETROPERITNL ABD,  LTD - Hemoglobin A1c - Insulin, random  5. Vitamin D deficiency  - VITAMIN D 25  Hydroxy   6. Idiopathic gout  - Uric acid  7. Gastroesophageal reflux disease w/ Barrett's   - CBC with Differential/Platelet - Increase per Dr Loletha Carrow to esomeprazole (NEXIUM) 40 MG capsule; Take 1 capsule 2 x / day for Barrett's Esophagus -  Dispense: 180 capsule; Refill: 3  8. Stage 3 CKD (GFR 56 ml/min)  - Urinalysis, Routine w reflex microscopic - Microalbumin / creatinine urine ratio - BASIC METABOLIC PANEL WITH GFR  9. Testosterone deficiency  - Testosterone  10. Screening for colorectal cancer  - POC Hemoccult Bld/Stl   11. Prostate cancer  screening  - PSA  12. Screening for ischemic heart disease  - EKG 12-Lead  13. Family history of ischemic heart disease  - EKG 12-Lead - [place], RETROPERITNL ABD,  LTD  14. Former smoker  - EKG 12-Lead - [place], RETROPERITNL ABD,  LTD  15. Screening for AAA (aortic abdominal aneurysm)  - [place], RETROPERITNL ABD,  LTD  16. Fatigue  - Iron,Total/Total Iron Binding Cap - Vitamin B12 - Testosterone - CBC with Differential/Platelet  17. Medication management - Urinalysis, Routine w reflex microscopic - Microalbumin / creatinine urine ratio - Uric acid - CBC with Differential/Platelet - BASIC METABOLIC PANEL WITH GFR - Hepatic function panel - Magnesium - Lipid panel - TSH - Hemoglobin A1c - Insulin, random - VITAMIN D 25 Hydroxy   18. Screening examination for pulmonary tuberculosis  - PPD      Patient was counseled in prudent diet, weight control to achieve/maintain BMI less than 25, BP monitoring, regular exercise and medications as discussed.  Discussed med effects and SE's. Routine screening labs and tests as requested with regular follow-up as recommended. Over 40 minutes of exam, counseling, chart review and high complex critical decision making was performed

## 2017-10-12 NOTE — Patient Instructions (Signed)
Barrett Esophagus Barrett esophagus occurs when the tissue that lines the esophagus changes or becomes damaged. The esophagus is the tube that carries food from the throat to the stomach. With Barrett esophagus, the cells that line the esophagus are replaced by cells that are similar to the lining of the intestines (intestinal metaplasia). Barrett esophagus itself may not cause any symptoms. However, many people who have Barrett esophagus also have gastroesophageal reflux disease (GERD), which may cause symptoms such as heartburn. Treatment may include medicines, procedures to destroy the abnormal cells, or surgery. Over time, a few people with this condition may develop cancer of the esophagus. What are the causes? The exact cause of this condition is not known. In some cases, the condition develops from damage to the lining of the esophagus caused by GERD. GERD occurs when stomach acids flow up from the stomach into the esophagus. Frequent symptoms of GERD may cause intestinal metaplasia or cause cell changes (dysplasia). What increases the risk? The following factors may make you more likely to develop this condition:  Having GERD.  Being any of the following: ? Male. ? White (Caucasian). ? Obese. ? Older than 50.  Having a hiatal hernia.  Smoking.  What are the signs or symptoms? People with Barrett esophagus often have no symptoms. However, many people with this condition also have GERD. Symptoms of GERD may include:  Heartburn.  Difficulty swallowing.  Dry cough.  How is this diagnosed? Barrett esophagus may be diagnosed with an exam called an upper gastrointestinal endoscopy. During this exam, a thin, flexible tube (endoscope) is passed down your esophagus. The endoscope has a light and camera on the end of it. Your health care provider uses the endoscope to view the inside of your esophagus. During the exam, several tissue samples will be removed (biopsy) from your esophagus so  they can be checked for intestinal metaplasia or dysplasia. How is this treated? Treatment for this condition may include:  Medicines (proton pump inhibitors, or PPIs) to decrease or stop GERD.  Periodic endoscopic exams to make sure that cancer is not developing.  A procedure or surgery for dysplasia. This may include: ? Endoscopic removal or destruction of abnormal cells. ? Removal of part of the esophagus (esophagectomy).  Follow these instructions at home: Eating and drinking  Eat more fruits and vegetables.  Avoid fatty foods.  Eat small, frequent meals instead of large meals.  Avoid foods that cause heartburn. These foods include: ? Coffee and alcoholic drinks. ? Tomatoes and foods made with tomatoes. ? Greasy or spicy foods. ? Chocolate and peppermint. General instructions   Take over-the-counter and prescription medicines only as told by your health care provider.  Do not use any tobacco products, such as cigarettes, chewing tobacco, and e-cigarettes. If you need help quitting, ask your health care provider.  If your health care provider is treating you for GERD, make sure you follow all instructions and take medicines as directed.  Keep all follow-up visits as told by your health care provider. This is important. +++++++++++++++++++++++++++++  Food Choices for Gastroesophageal Reflux Disease, Adult When you have gastroesophageal reflux disease (GERD), the foods you eat and your eating habits are very important. Choosing the right foods can help ease the discomfort of GERD. Consider working with a diet and nutrition specialist (dietitian) to help you make healthy food choices. What general guidelines should I follow? Eating plan  Choose healthy foods low in fat, such as fruits, vegetables, whole grains, low-fat dairy products, and  lean meat, fish, and poultry.  Eat frequent, small meals instead of three large meals each day. Eat your meals slowly, in a relaxed  setting. Avoid bending over or lying down until 2-3 hours after eating.  Limit high-fat foods such as fatty meats or fried foods.  Limit your intake of oils, butter, and shortening to less than 8 teaspoons each day.  Avoid the following: ? Foods that cause symptoms. These may be different for different people. Keep a food diary to keep track of foods that cause symptoms. ? Alcohol. ? Drinking large amounts of liquid with meals. ? Eating meals during the 2-3 hours before bed.  Cook foods using methods other than frying. This may include baking, grilling, or broiling. Lifestyle   Maintain a healthy weight. Ask your health care provider what weight is healthy for you. If you need to lose weight, work with your health care provider to do so safely.  Exercise for at least 30 minutes on 5 or more days each week, or as told by your health care provider.  Avoid wearing clothes that fit tightly around your waist and chest.  Do not use any products that contain nicotine or tobacco, such as cigarettes and e-cigarettes. If you need help quitting, ask your health care provider.  Sleep with the head of your bed raised. Use a wedge under the mattress or blocks under the bed frame to raise the head of the bed. What foods are not recommended? The items listed may not be a complete list. Talk with your dietitian about what dietary choices are best for you.  Grains Pastries or quick breads with added fat. [place] toast.  Vegetables  Deep fried vegetables. [place] fries. Any vegetables prepared with added fat. Any vegetables that cause symptoms. For some people this may include tomatoes and tomato products, chili peppers, onions and garlic, and horseradish.  Fruits  ABSOLUTELY NO BANANAS  Any fruits prepared with added fat. Any fruits that cause symptoms. For some people this may include citrus fruits, such as oranges, grapefruit, pineapple, and lemons.  Meats and other protein foods High-fat  meats, such as fatty beef or pork, hot dogs, ribs, ham, sausage, salami and bacon. Fried meat or protein, including fried fish and fried chicken. Nuts and nut butters.  Dairy Whole milk and chocolate milk. Sour cream. Cream. Ice cream. Cream cheese. Milk shakes.  Beverages Coffee and tea, with or without caffeine. Carbonated beverages. Sodas. Energy drinks. Fruit juice made with acidic fruits (such as orange or grapefruit). Tomato juice. Alcoholic drinks.  Fats and oils Butter. Margarine. Shortening.   Sweets and desserts Chocolate and cocoa. Donuts.  Seasoning and other foods Pepper. Peppermint and spearmint. Any condiments, herbs, or seasonings that cause symptoms. For some people, this may include curry, hot sauce, or vinegar-based salad dressings.  Summary  When you have gastroesophageal reflux disease (GERD), food and lifestyle choices are very important to help ease the discomfort of GERD.  Eat frequent, small meals instead of three large meals each day. Eat your meals slowly, in a relaxed setting. Avoid bending over or lying down until 2-3 hours after eating.  Limit high-fat foods such as fatty meat or fried foods. +++++++++++++++++++++++++++++++++  Preventive Care for Adults  A healthy lifestyle and preventive care can promote health and wellness. Preventive health guidelines for men include the following key practices:  A routine yearly physical is a good way to check with your health care provider about your health and preventative screening. It is  a chance to share any concerns and updates on your health and to receive a thorough exam.  Visit your dentist for a routine exam and preventative care every 6 months. Brush your teeth twice a day and floss once a day. Good oral hygiene prevents tooth decay and gum disease.  The frequency of eye exams is based on your age, health, family medical history, use of contact lenses, and other factors. Follow your health care provider's  recommendations for frequency of eye exams.  Eat a healthy diet. Foods such as vegetables, fruits, whole grains, low-fat dairy products, and lean protein foods contain the nutrients you need without too many calories. Decrease your intake of foods high in solid fats, added sugars, and salt. Eat the right amount of calories for you.Get information about a proper diet from your health care provider, if necessary.  Regular physical exercise is one of the most important things you can do for your health. Most adults should get at least 150 minutes of moderate-intensity exercise (any activity that increases your heart rate and causes you to sweat) each week. In addition, most adults need muscle-strengthening exercises on 2 or more days a week.  Maintain a healthy weight. The body mass index (BMI) is a screening tool to identify possible weight problems. It provides an estimate of body fat based on height and weight. Your health care provider can find your BMI and can help you achieve or maintain a healthy weight.For adults 20 years and older:  A BMI below 18.5 is considered underweight.  A BMI of 18.5 to 24.9 is normal.  A BMI of 25 to 29.9 is considered overweight.  A BMI of 30 and above is considered obese.  Maintain normal blood lipids and cholesterol levels by exercising and minimizing your intake of saturated fat. Eat a balanced diet with plenty of fruit and vegetables. Blood tests for lipids and cholesterol should begin at age 9 and be repeated every 5 years. If your lipid or cholesterol levels are high, you are over 50, or you are at high risk for heart disease, you may need your cholesterol levels checked more frequently.Ongoing high lipid and cholesterol levels should be treated with medicines if diet and exercise are not working.  If you smoke, find out from your health care provider how to quit. If you do not use tobacco, do not start.  Lung cancer screening is recommended for adults  aged 66-80 years who are at high risk for developing lung cancer because of a history of smoking. A yearly low-dose CT scan of the lungs is recommended for people who have at least a 30-pack-year history of smoking and are a current smoker or have quit within the past 15 years. A pack year of smoking is smoking an average of 1 pack of cigarettes a day for 1 year (for example: 1 pack a day for 30 years or 2 packs a day for 15 years). Yearly screening should continue until the smoker has stopped smoking for at least 15 years. Yearly screening should be stopped for people who develop a health problem that would prevent them from having lung cancer treatment.  If you choose to drink alcohol, do not have more than 2 drinks per day. One drink is considered to be 12 ounces (355 mL) of beer, 5 ounces (148 mL) of wine, or 1.5 ounces (44 mL) of liquor.  Avoid use of street drugs. Do not share needles with anyone. Ask for help if you need support  or instructions about stopping the use of drugs.  High blood pressure causes heart disease and increases the risk of stroke. Your blood pressure should be checked at least every 1-2 years. Ongoing high blood pressure should be treated with medicines, if weight loss and exercise are not effective.  If you are 57-64 years old, ask your health care provider if you should take aspirin to prevent heart disease.  Diabetes screening involves taking a blood sample to check your fasting blood sugar level. This should be done once every 3 years, after age 58, if you are within normal weight and without risk factors for diabetes. Testing should be considered at a younger age or be carried out more frequently if you are overweight and have at least 1 risk factor for diabetes.  Colorectal cancer can be detected and often prevented. Most routine colorectal cancer screening begins at the age of 84 and continues through age 80. However, your health care provider may recommend screening at  an earlier age if you have risk factors for colon cancer. On a yearly basis, your health care provider may provide home test kits to check for hidden blood in the stool. Use of a small camera at the end of a tube to directly examine the colon (sigmoidoscopy or colonoscopy) can detect the earliest forms of colorectal cancer. Talk to your health care provider about this at age 37, when routine screening begins. Direct exam of the colon should be repeated every 5-10 years through age 55, unless early forms of precancerous polyps or small growths are found.   Talk with your health care provider about prostate cancer screening.  Testicular cancer screening isrecommended for adult males. Screening includes self-exam, a health care provider exam, and other screening tests. Consult with your health care provider about any symptoms you have or any concerns you have about testicular cancer.  Use sunscreen. Apply sunscreen liberally and repeatedly throughout the day. You should seek shade when your shadow is shorter than you. Protect yourself by wearing long sleeves, pants, a wide-brimmed hat, and sunglasses year round, whenever you are outdoors.  Once a month, do a whole-body skin exam, using a mirror to look at the skin on your back. Tell your health care provider about new moles, moles that have irregular borders, moles that are larger than a pencil eraser, or moles that have changed in shape or color.  Stay current with required vaccines (immunizations).  Influenza vaccine. All adults should be immunized every year.  Tetanus, diphtheria, and acellular pertussis (Td, Tdap) vaccine. An adult who has not previously received Tdap or who does not know his vaccine status should receive 1 dose of Tdap. This initial dose should be followed by tetanus and diphtheria toxoids (Td) booster doses every 10 years. Adults with an unknown or incomplete history of completing a 3-dose immunization series with Td-containing  vaccines should begin or complete a primary immunization series including a Tdap dose. Adults should receive a Td booster every 10 years.  Varicella vaccine. An adult without evidence of immunity to varicella should receive 2 doses or a second dose if he has previously received 1 dose.  Human papillomavirus (HPV) vaccine. Males aged 61-21 years who have not received the vaccine previously should receive the 3-dose series. Males aged 22-26 years may be immunized. Immunization is recommended through the age of 2 years for any male who has sex with males and did not get any or all doses earlier. Immunization is recommended for any person with  an immunocompromised condition through the age of 70 years if he did not get any or all doses earlier. During the 3-dose series, the second dose should be obtained 4-8 weeks after the first dose. The third dose should be obtained 24 weeks after the first dose and 16 weeks after the second dose.  Zoster vaccine. One dose is recommended for adults aged 63 years or older unless certain conditions are present.    PREVNAR  - Pneumococcal 13-valent conjugate (PCV13) vaccine. When indicated, a person who is uncertain of his immunization history and has no record of immunization should receive the PCV13 vaccine. An adult aged 67 years or older who has certain medical conditions and has not been previously immunized should receive 1 dose of PCV13 vaccine. This PCV13 should be followed with a dose of pneumococcal polysaccharide (PPSV23) vaccine. The PPSV23 vaccine dose should be obtained at least 8 weeks after the dose of PCV13 vaccine. An adult aged 49 years or older who has certain medical conditions and previously received 1 or more doses of PPSV23 vaccine should receive 1 dose of PCV13. The PCV13 vaccine dose should be obtained 1 or more years after the last PPSV23 vaccine dose.    PNEUMOVAX - Pneumococcal polysaccharide (PPSV23) vaccine. When PCV13 is also indicated,  PCV13 should be obtained first. All adults aged 40 years and older should be immunized. An adult younger than age 46 years who has certain medical conditions should be immunized. Any person who resides in a nursing home or long-term care facility should be immunized. An adult smoker should be immunized. People with an immunocompromised condition and certain other conditions should receive both PCV13 and PPSV23 vaccines. People with human immunodeficiency virus (HIV) infection should be immunized as soon as possible after diagnosis. Immunization during chemotherapy or radiation therapy should be avoided. Routine use of PPSV23 vaccine is not recommended for American Indians, [place] Natives, or people younger than 65 years unless there are medical conditions that require PPSV23 vaccine. When indicated, people who have unknown immunization and have no record of immunization should receive PPSV23 vaccine. One-time revaccination 5 years after the first dose of PPSV23 is recommended for people aged 19-64 years who have chronic kidney failure, nephrotic syndrome, asplenia, or immunocompromised conditions. People who received 1-2 doses of PPSV23 before age 5 years should receive another dose of PPSV23 vaccine at age 38 years or later if at least 5 years have passed since the previous dose. Doses of PPSV23 are not needed for people immunized with PPSV23 at or after age 70 years.    Hepatitis A vaccine. Adults who wish to be protected from this disease, have certain high-risk conditions, work with hepatitis A-infected animals, work in hepatitis A research labs, or travel to or work in countries with a high rate of hepatitis A should be immunized. Adults who were previously unvaccinated and who anticipate close contact with an international adoptee during the first 60 days after arrival in the [place] from a country with a high rate of hepatitis A should be immunized.    Hepatitis B vaccine. Adults should be  immunized if they wish to be protected from this disease, have certain high-risk conditions, may be exposed to blood or other infectious body fluids, are household contacts or sex partners of hepatitis B positive people, are clients or workers in certain care facilities, or travel to or work in countries with a high rate of hepatitis B.   Preventive Service / Frequency   Ages 14  to 64  Blood pressure check.  Lipid and cholesterol check  Lung cancer screening. / Every year if you are aged 11-80 years and have a 30-pack-year history of smoking and currently smoke or have quit within the past 15 years. Yearly screening is stopped once you have quit smoking for at least 15 years or develop a health problem that would prevent you from having lung cancer treatment.  Fecal occult blood test (FOBT) of stool. / Every year beginning at age 50 and continuing until age 21. You may not have to do this test if you get a colonoscopy every 10 years.  Flexible sigmoidoscopy** or colonoscopy.** / Every 5 years for a flexible sigmoidoscopy or every 10 years for a colonoscopy beginning at age 23 and continuing until age 27. Screening for abdominal aortic aneurysm (AAA)  by ultrasound is recommended for people who have history of high blood pressure or who are current or former smokers. +++++++++++ Recommend Adult Low Dose Aspirin or  coated  Aspirin 81 mg daily  To reduce risk of Colon Cancer 20 %,  Skin Cancer 26 % ,  Melanoma 46%  and  Pancreatic cancer 60% ++++++++++++++++++++ Vitamin D goal  is between 70-100.  Please make sure that you are taking your Vitamin D as directed.  It is very important as a natural anti-inflammatory  helping hair, skin, and nails, as well as reducing stroke and heart attack risk.  It helps your bones and helps with mood. It also decreases numerous cancer risks so please take it as directed.  Low Vit D is associated with a 200-300% higher risk for CANCER  and 200-300%  higher risk for HEART   ATTACK  &  STROKE.   .....................................Marland Kitchen It is also associated with higher death rate at younger ages,  autoimmune diseases like Rheumatoid arthritis, Lupus, Multiple Sclerosis.    Also many other serious conditions, like depression, Alzheimer's Dementia, infertility, muscle aches, fatigue, fibromyalgia - just to name a few. +++++++++++++++++++++ Recommend the book "The END of DIETING" by Dr Excell Seltzer  & the book "The END of DIABETES " by Dr Excell Seltzer At [email or web address] - get book & Audio CD's    Being diabetic has a  300% increased risk for heart attack, stroke, cancer, and alzheimer- type vascular dementia. It is very important that you work harder with diet by avoiding all foods that are white. Avoid white rice (brown & wild rice is OK), white potatoes (sweetpotatoes in moderation is OK), White bread or wheat bread or anything made out of white flour like bagels, donuts, rolls, buns, biscuits, cakes, pastries, cookies, pizza crust, and pasta (made from white flour & egg whites) - vegetarian pasta or spinach or wheat pasta is OK. Multigrain breads like Arnold's or Pepperidge Farm, or multigrain sandwich thins or flatbreads.  Diet, exercise and weight loss can reverse and cure diabetes in the early stages.  Diet, exercise and weight loss is very important in the control and prevention of complications of diabetes which affects every system in your body, ie. Brain - dementia/stroke, eyes - glaucoma/blindness, heart - heart attack/heart failure, kidneys - dialysis, stomach - gastric paralysis, intestines - malabsorption, nerves - severe painful neuritis, circulation - gangrene & loss of a leg(s), and finally cancer and Alzheimers.    I recommend avoid fried & greasy foods,  sweets/candy, white rice (brown or wild rice or Quinoa is OK), white potatoes (sweet potatoes are OK) - anything made from white flour - bagels, doughnuts, rolls,  buns, biscuits,white and  wheat breads, pizza crust and traditional pasta made of white flour & egg white(vegetarian pasta or spinach or wheat pasta is OK).  Multi-grain bread is OK - like multi-grain flat bread or sandwich thins. Avoid alcohol in excess. Exercise is also important.    Eat all the vegetables you want - avoid meat, especially red meat and dairy - especially cheese.  Cheese is the most concentrated form of trans-fats which is the worst thing to clog up our arteries. Veggie cheese is OK which can be found in the fresh produce section at [employer] or [employer] or [employer]  ++++++++++++++++++++++ DASH Eating Plan  DASH stands for "Dietary Approaches to Stop Hypertension."   The DASH eating plan is a healthy eating plan that has been shown to reduce high blood pressure (hypertension). Additional health benefits may include reducing the risk of type 2 diabetes mellitus, heart disease, and stroke. The DASH eating plan may also help with weight loss. WHAT DO I NEED TO KNOW ABOUT THE DASH EATING PLAN? For the DASH eating plan, you will follow these general guidelines:  Choose foods with a percent daily value for sodium of less than 5% (as listed on the food label).  Use salt-free seasonings or herbs instead of table salt or sea salt.  Check with your health care provider or pharmacist before using salt substitutes.  Eat lower-sodium products, often labeled as "lower sodium" or "no salt added."  Eat fresh foods.  Eat more vegetables, fruits, and low-fat dairy products.  Choose whole grains. Look for the word "whole" as the first word in the ingredient list.  Choose fish   Limit sweets, desserts, sugars, and sugary drinks.  Choose heart-healthy fats.  Eat veggie cheese   Eat more home-cooked food and less restaurant, buffet, and fast food.  Limit fried foods.  Cook foods using methods other than frying.  Limit canned vegetables. If you do use them, rinse them well to decrease the  sodium.  When eating at a restaurant, ask that your food be prepared with less salt, or no salt if possible.                      WHAT FOODS CAN I EAT? Read Dr Fara Olden Fuhrman's books on The End of Dieting & The End of Diabetes  Grains Whole grain or whole wheat bread. Brown rice. Whole grain or whole wheat pasta. Quinoa, bulgur, and whole grain cereals. Low-sodium cereals. Corn or whole wheat flour tortillas. Whole grain cornbread. Whole grain crackers. Low-sodium crackers.  Vegetables Fresh or frozen vegetables (raw, steamed, roasted, or grilled). Low-sodium or reduced-sodium tomato and vegetable juices. Low-sodium or reduced-sodium tomato sauce and paste. Low-sodium or reduced-sodium canned vegetables.   Fruits All fresh, canned (in natural juice), or frozen fruits.  Protein Products  All fish and seafood.  Dried beans, peas, or lentils. Unsalted nuts and seeds. Unsalted canned beans.  Dairy Low-fat dairy products, such as skim or 1% milk, 2% or reduced-fat cheeses, low-fat ricotta or cottage cheese, or plain low-fat yogurt. Low-sodium or reduced-sodium cheeses.  Fats and Oils Tub margarines without trans fats. Light or reduced-fat mayonnaise and salad dressings (reduced sodium). Avocado. Safflower, olive, or canola oils. Natural peanut or almond butter.  Other Unsalted popcorn and pretzels. The items listed above may not be a complete list of recommended foods or beverages. Contact your dietitian for more options.  +++++++++++++++++++  WHAT FOODS ARE NOT RECOMMENDED? Grains/ White flour or wheat  flour White bread. White pasta. White rice. Refined cornbread. Bagels and croissants. Crackers that contain trans fat.  Vegetables  Creamed or fried vegetables. Vegetables in a . Regular canned vegetables. Regular canned tomato sauce and paste. Regular tomato and vegetable juices.  Fruits Dried fruits. Canned fruit in light or heavy syrup. Fruit juice.  Meat and Other Protein  Products Meat in general - RED meat & White meat.  Fatty cuts of meat. Ribs, chicken wings, all processed meats as bacon, sausage, bologna, salami, fatback, hot dogs, bratwurst and packaged luncheon meats.  Dairy Whole or 2% milk, cream, half-and-half, and cream cheese. Whole-fat or sweetened yogurt. Full-fat cheeses or blue cheese. Non-dairy creamers and whipped toppings. Processed cheese, cheese spreads, or cheese curds.  Condiments Onion and garlic salt, seasoned salt, table salt, and sea salt. Canned and packaged gravies. Worcestershire sauce. Tartar sauce. Barbecue sauce. Teriyaki sauce. Soy sauce, including reduced sodium. Steak sauce. Fish sauce. Oyster sauce. Cocktail sauce. Horseradish. Ketchup and mustard. Meat flavorings and tenderizers. Bouillon cubes. Hot sauce. Tabasco sauce. Marinades. Taco seasonings. Relishes.  Fats and Oils Butter, stick margarine, lard, shortening and bacon fat. Coconut, palm kernel, or palm oils. Regular salad dressings.  Pickles and olives. Salted popcorn and pretzels.  The items listed above may not be a complete list of foods and beverages to avoid.

## 2017-10-13 LAB — CBC WITH DIFFERENTIAL/PLATELET
BASOS PCT: 0.5 %
Basophils Absolute: 32 cells/uL (ref 0–200)
EOS ABS: 120 {cells}/uL (ref 15–500)
Eosinophils Relative: 1.9 %
HEMATOCRIT: 41.5 % (ref 38.5–50.0)
Hemoglobin: 14.7 g/dL (ref 13.2–17.1)
LYMPHS ABS: 2054 {cells}/uL (ref 850–3900)
MCH: 30.1 pg (ref 27.0–33.0)
MCHC: 35.4 g/dL (ref 32.0–36.0)
MCV: 84.9 fL (ref 80.0–100.0)
MPV: 11.3 fL (ref 7.5–12.5)
Monocytes Relative: 13.5 %
Neutro Abs: 3245 cells/uL (ref 1500–7800)
Neutrophils Relative %: 51.5 %
PLATELETS: 151 10*3/uL (ref 140–400)
RBC: 4.89 10*6/uL (ref 4.20–5.80)
RDW: 13.3 % (ref 11.0–15.0)
TOTAL LYMPHOCYTE: 32.6 %
WBC: 6.3 10*3/uL (ref 3.8–10.8)
WBCMIX: 851 {cells}/uL (ref 200–950)

## 2017-10-13 LAB — INSULIN, RANDOM: INSULIN: 17.8 u[IU]/mL (ref 2.0–19.6)

## 2017-10-13 LAB — BASIC METABOLIC PANEL WITH GFR
BUN: 13 mg/dL (ref 7–25)
CHLORIDE: 102 mmol/L (ref 98–110)
CO2: 28 mmol/L (ref 20–32)
CREATININE: 1.22 mg/dL (ref 0.70–1.33)
Calcium: 9.5 mg/dL (ref 8.6–10.3)
GFR, EST AFRICAN AMERICAN: 75 mL/min/{1.73_m2} (ref >=60)
GFR, Est Non African American: 64 mL/min/{1.73_m2} (ref >=60)
GLUCOSE: 86 mg/dL (ref 65–99)
Potassium: 4 mmol/L (ref 3.5–5.3)
Sodium: 139 mmol/L (ref 135–146)

## 2017-10-13 LAB — URINALYSIS, ROUTINE W REFLEX MICROSCOPIC
Bilirubin Urine: NEGATIVE
GLUCOSE, UA: NEGATIVE
Hgb urine dipstick: NEGATIVE
KETONES UR: NEGATIVE
Leukocytes, UA: NEGATIVE
NITRITE: NEGATIVE
PH: 7 (ref 5.0–8.0)
Protein, ur: NEGATIVE
SPECIFIC GRAVITY, URINE: 1.023 (ref 1.001–1.03)

## 2017-10-13 LAB — VITAMIN B12: VITAMIN B 12: 1125 pg/mL — AB (ref 200–1100)

## 2017-10-13 LAB — LIPID PANEL
CHOLESTEROL: 186 mg/dL (ref <200)
HDL: 45 mg/dL (ref >40)
LDL CHOLESTEROL (CALC): 118 mg/dL — AB
Non-HDL Cholesterol (Calc): 141 mg/dL (calc) — ABNORMAL HIGH (ref <130)
TRIGLYCERIDES: 120 mg/dL (ref <150)
Total CHOL/HDL Ratio: 4.1 (calc) (ref <5.0)

## 2017-10-13 LAB — HEPATIC FUNCTION PANEL
AG RATIO: 1.5 (calc) (ref 1.0–2.5)
ALBUMIN MSPROF: 4.4 g/dL (ref 3.6–5.1)
ALT: 18 U/L (ref 9–46)
AST: 24 U/L (ref 10–35)
Alkaline phosphatase (APISO): 63 U/L (ref 40–115)
Bilirubin, Direct: 0.1 mg/dL (ref 0.0–0.2)
GLOBULIN: 3 g/dL (ref 1.9–3.7)
Indirect Bilirubin: 0.4 mg/dL (calc) (ref 0.2–1.2)
TOTAL PROTEIN: 7.4 g/dL (ref 6.1–8.1)
Total Bilirubin: 0.5 mg/dL (ref 0.2–1.2)

## 2017-10-13 LAB — TSH: TSH: 1.57 mIU/L (ref 0.40–4.50)

## 2017-10-13 LAB — MAGNESIUM: MAGNESIUM: 1.8 mg/dL (ref 1.5–2.5)

## 2017-10-13 LAB — HEMOGLOBIN A1C
Hgb A1c MFr Bld: 5.7 % of total Hgb — ABNORMAL HIGH (ref <5.7)
Mean Plasma Glucose: 117 (calc)
eAG (mmol/L): 6.5 (calc)

## 2017-10-13 LAB — IRON, TOTAL/TOTAL IRON BINDING CAP
%SAT: 22 % (ref 15–60)
IRON: 88 ug/dL (ref 50–180)
TIBC: 403 mcg/dL (calc) (ref 250–425)

## 2017-10-13 LAB — PSA: PSA: 0.6 ng/mL (ref <=4.0)

## 2017-10-13 LAB — MICROALBUMIN / CREATININE URINE RATIO
Creatinine, Urine: 180 mg/dL (ref 20–320)
MICROALB UR: 0.9 mg/dL
Microalb Creat Ratio: 5 mcg/mg creat (ref <30)

## 2017-10-13 LAB — URIC ACID: URIC ACID, SERUM: 6.8 mg/dL (ref 4.0–8.0)

## 2017-10-13 LAB — VITAMIN D 25 HYDROXY (VIT D DEFICIENCY, FRACTURES): VIT D 25 HYDROXY: 71 ng/mL (ref 30–100)

## 2017-10-13 LAB — TESTOSTERONE: Testosterone: 222 ng/dL — ABNORMAL LOW (ref 250–827)

## 2017-10-28 ENCOUNTER — Encounter (INDEPENDENT_AMBULATORY_CARE_PROVIDER_SITE_OTHER): Payer: Self-pay

## 2017-10-28 LAB — TB SKIN TEST
INDURATION: 0 mm
TB Skin Test: NEGATIVE

## 2017-12-05 ENCOUNTER — Other Ambulatory Visit: Payer: Self-pay | Admitting: Internal Medicine

## 2017-12-27 NOTE — Progress Notes (Deleted)
Subjective:    Christopher [name] is a 62 y.o. male who presents with {right/left/bi:30031} hip pain. Onset of the symptoms was {onset:14048}. Inciting event: {inciting event:14107}. The patient reports the hip pain {hip pain:14103}. Aggravating symptoms include: {pain aggravating factors:14088}. Patient {has had:32492} prior hip problems. Previous visits for this problem: {seen previously:14038}. Evaluation to date: {hip pain eval:14106}. Treatment to date: {hip pain treatment:14105}.  {Common ambulatory SmartLinks:19316}   Review of Systems {ros; complete:30496}   Objective:    There were no vitals taken for this visit. Right hip: {exam; hip:14110}  Left hip: {exam; hip:14110}   Imaging: X-ray {hip; r/l/both:19462}: {normal:5769::"no fracture, dislocation, swelling or degenerative changes noted"}    Assessment:    {hip pain diagnoses:14111}    Plan:    {plan; hip pain:14112}

## 2017-12-28 ENCOUNTER — Ambulatory Visit: Payer: Self-pay | Admitting: Adult Health

## 2017-12-28 NOTE — Progress Notes (Signed)
Subjective:    Christopher [name] is a 62 y.o. male with history of morbid obesity (BMI 41) who presents with left hip pain. Onset of the symptoms was about a month ago. Inciting event: none. The patient reports the hip pain is aggravated by walking. Aggravating symptoms include: going up and down stairs, running and walking. Patient has had no prior hip problems. Previous visits for this problem: none. Evaluation to date: none. Treatment to date: BC powders, muscle relaxer, thereworks (topical cream). He reports muscle relaxer and BC powder have been helpful. He reports pain is "deep in the joint" and does radiate some. Pain is intermittent and is relieved by rest. He is established with Dr. Berenice Primas for previous orthopedic concerns (knee arthritis, s/p bilateral knee arthroscopies).     Review of Systems Constitutional: negative for chills, fatigue, fevers, malaise, night sweats, sweats and weight loss Gastrointestinal: negative for abdominal pain, change in bowel habits, constipation, diarrhea, melena and nausea Genitourinary:negative for perineal pain, dysuria and hematuria Musculoskeletal:positive for bone pain, negative for back pain, muscle weakness, myalgias and stiff joints Neurological: negative for paresthesia and weakness   Objective:    BP (!) 149/99   Pulse 84   Temp 97.9 F (36.6 C)   Ht 6' (1.829 m)   Wt (!) 311 lb (141.1 kg)   SpO2 94%   BMI 42.18 kg/m  Right hip: full painless range of motion, without tenderness  Left hip: full painless range of motion, without tenderness    Abdominal: BS active in all quadrants, no palpable abnormalities, no palpable inguinal hernias  Imaging: X-ray the left hip: ordered, but results not yet available    Assessment:   Suspect hip strain, imaging to R/o arthritis/bony abnormality  Plan:    Natural history and expected course discussed. Questions answered. NSAIDs per medication orders. OTC analgesics as needed. X-rays per  orders. Can continue with muscle relaxers (flexerile, methocarbamol)    May try prednisone taper if pain not improving in 2 weeks with NSAIDs Follow up with ortho if pain not improving in 8-12 weeks

## 2017-12-29 ENCOUNTER — Ambulatory Visit: Payer: 59 | Admitting: Adult Health

## 2017-12-29 ENCOUNTER — Encounter: Payer: Self-pay | Admitting: Adult Health

## 2017-12-29 ENCOUNTER — Ambulatory Visit (HOSPITAL_COMMUNITY)
Admission: RE | Admit: 2017-12-29 | Discharge: 2017-12-29 | Disposition: A | Discharge status: Home / Self Care | Payer: 59 | Source: Ambulatory Visit | Attending: Adult Health | Admitting: Adult Health

## 2017-12-29 VITALS — BP 149/99 | HR 84 | Temp 97.9°F | Ht 72.0 in | Wt 311.0 lb

## 2017-12-29 DIAGNOSIS — M25552 Pain in left hip: Secondary | ICD-10-CM | POA: Diagnosis not present

## 2017-12-29 MED ORDER — MELOXICAM 15 MG PO TABS: ORAL_TABLET | ORAL | 1 refills | Status: DC

## 2017-12-29 MED ORDER — PREDNISONE 20 MG PO TABS: ORAL_TABLET | ORAL | 0 refills | Status: AC

## 2017-12-31 ENCOUNTER — Encounter: Payer: Self-pay | Admitting: Adult Health

## 2017-12-31 ENCOUNTER — Other Ambulatory Visit: Payer: Self-pay | Admitting: Adult Health

## 2017-12-31 DIAGNOSIS — R937 Abnormal findings on diagnostic imaging of other parts of musculoskeletal system: Secondary | ICD-10-CM

## 2017-12-31 NOTE — Progress Notes (Signed)
Indeterminate lytic lesions of pelvis noted incidentally on hip xray; radiologist unable to rule out bone lesion/metastasis or myeloma. Will order CT per recommendation to follow up.

## 2018-01-05 ENCOUNTER — Encounter: Payer: Self-pay | Admitting: Internal Medicine

## 2018-01-08 ENCOUNTER — Ambulatory Visit
Admission: RE | Admit: 2018-01-08 | Discharge: 2018-01-08 | Disposition: A | Discharge status: Home / Self Care | Payer: 59 | Source: Ambulatory Visit | Attending: Adult Health | Admitting: Adult Health

## 2018-01-08 DIAGNOSIS — R937 Abnormal findings on diagnostic imaging of other parts of musculoskeletal system: Secondary | ICD-10-CM

## 2018-01-08 DIAGNOSIS — K449 Diaphragmatic hernia without obstruction or gangrene: Secondary | ICD-10-CM | POA: Diagnosis not present

## 2018-01-08 MED ORDER — IOPAMIDOL (ISOVUE-300) INJECTION 61%
125.0000 mL | Freq: Once | INTRAVENOUS | Status: AC | PRN
  Administered 2018-01-08: 125 mL via INTRAVENOUS

## 2018-01-10 ENCOUNTER — Encounter: Payer: Self-pay | Admitting: Adult Health

## 2018-01-10 DIAGNOSIS — I708 Atherosclerosis of other arteries: Secondary | ICD-10-CM

## 2018-01-10 DIAGNOSIS — I700 Atherosclerosis of aorta: Secondary | ICD-10-CM | POA: Insufficient documentation

## 2018-01-15 DIAGNOSIS — F418 Other specified anxiety disorders: Secondary | ICD-10-CM | POA: Insufficient documentation

## 2018-01-15 DIAGNOSIS — F324 Major depressive disorder, single episode, in partial remission: Secondary | ICD-10-CM | POA: Insufficient documentation

## 2018-01-15 NOTE — Progress Notes (Signed)
FOLLOW UP  Assessment and Plan:   Hypertension Well controlled with current medications  Monitor blood pressure at home; patient to call if consistently greater than 130/80 Continue DASH diet.   Reminder to go to the ER if any CP, SOB, nausea, dizziness, severe HA, changes vision/speech, left arm numbness and tingling and jaw pain.  Cholesterol Currently borderline elevated; continue fenofibrate and diet; if LDL progresses further will initiate statin Continue low cholesterol diet and exercise.  Check lipid panel.   Prediabetes Continue diet and exercise.  Perform daily foot/skin check, notify office of any concerning changes.  Check A1C  Morbid obesity Long discussion about weight loss, diet, and exercise Recommended diet heavy in fruits and veggies and low in animal meats, cheeses, and dairy products, appropriate calorie intake Discussed ideal weight for height  Will follow up in 3 months  Vitamin D Def At goal at last visit; continue supplementation to maintain goal of 70-100 Defer Vit D level  Gout Continue allopurinol Diet discussed Check uric acid as needed  Hypogonadism - continue to monitor, states medication is helping with symptoms of low T.   Depression/Anxiety Well managed by current regimen; continue medications - reminded to limit excessive benzo use, avoid daily use when possible Stress management techniques discussed, increase water, good sleep hygiene discussed, increase exercise, and increase veggies.    Continue diet and meds as discussed. Further disposition pending results of labs. Discussed med's effects and SE's.   Over 30 minutes of exam, counseling, chart review, and critical decision making was performed.   Future Appointments  Date Time Provider [place]  04/26/2018  9:45 AM Unk Pinto, MD [facility] None  11/01/2018  2:00 PM Unk Pinto, MD [facility] None     ----------------------------------------------------------------------------------------------------------------------  HPI 62 y.o. male  presents for 3 month follow up on hypertension, cholesterol, diabetes, morbid obesity, hypogonadism, gout, depression/anxiety and vitamin D deficiency. He reports R hip pain is improved with NSAID but not fully resolved. Xrays negative, exam was unremarkable. He is established with ortho, encouraged to follow up if symptoms lasting longer than 12 weeks.   he has a diagnosis of depression/anxiety and is currently on wellbutrin daily and xanax 0.5-1 mg TID PRN, reports symptoms are well controlled on current regimen. he currently takes 1 mg daily on most days.   BMI is Body mass index is 42.86 kg/m., he is working on diet and exercise. Wt Readings from Last 3 Encounters:  01/18/18 (!) 316 lb (143.3 kg)  12/29/17 (!) 311 lb (141.1 kg)  10/12/17 (!) 306 lb 3.2 oz (138.9 kg)   His blood pressure has been controlled at home, today their BP is BP: (!) 142/84  He does workout. He denies chest pain, shortness of breath, dizziness.   He is on cholesterol medication (fenofibrate, had cramping with crestor) and denies myalgias. His cholesterol is not at goal. The cholesterol last visit was:   Lab Results  Component Value Date   CHOL 186 10/12/2017   HDL 45 10/12/2017   LDLCALC 118 (H) 10/12/2017   TRIG 120 10/12/2017   CHOLHDL 4.1 10/12/2017    He has been working on diet and exercise for prediabetes, and denies foot ulcerations, increased appetite, nausea, paresthesia of the feet, polydipsia, polyuria, visual disturbances, vomiting and weight loss. Last A1C in the office was:  Lab Results  Component Value Date   HGBA1C 5.7 (H) 10/12/2017   Patient is on Vitamin D supplement and at goal at recent check:    Lab  Results  Component Value Date   VD25OH 55 10/12/2017     Patient is on allopurinol for gout and does not report a recent flare.  Lab Results   Component Value Date   LABURIC 6.8 10/12/2017   He has a history of testosterone deficiency and is on testosterone replacement. He states that the testosterone helps with his energy, libido, muscle mass. Lab Results  Component Value Date   TESTOSTERONE 222 (L) 10/12/2017      Current Medications:  Current Outpatient Medications on File Prior to Visit  Medication Sig  . 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 allopurinol (ZYLOPRIM) 300 MG tablet TAKE 1 TABLET BY MOUTH  DAILY FOR GOUT PREVENTION  . ALPRAZolam (XANAX) 1 MG tablet 1/2 TO 1 TAB TWO TO THREE  TIMES DAILY ONLY IF NEEDED  FOR ANXIETY ATTACK. TRY AND LIMIT TO 5 DAYS/WEEK TO  AVOID ADDICTION  . aspirin EC 81 MG tablet Take 81 mg by mouth daily.  Marland Kitchen buPROPion (WELLBUTRIN XL) 300 MG 24 hr tablet TAKE 1 TABLET BY MOUTH  EVERY MORNING  . Cyanocobalamin (VITAMIN B 12 PO) Take 1 tablet by mouth daily.    Marland Kitchen esomeprazole (NEXIUM) 40 MG capsule Take 1 capsule 2 x / day for Barrett's Esophagus  . fenofibrate 160 MG tablet TAKE 1 TABLET BY MOUTH  DAILY  . fish oil-omega-3 fatty acids 1000 MG capsule Take 1 g by mouth daily.   . Flaxseed, Linseed, (FLAX SEEDS PO) Take by mouth.  . furosemide (LASIX) 20 MG tablet TAKE 1 TABLET BY MOUTH  DAILY  . magnesium oxide (MAG-OX) 400 MG tablet Take 400 mg by mouth 2 (two) times daily.   . meloxicam (MOBIC) 15 MG tablet Take one daily with food for 2 weeks, can take with tylenol, can not take with aleve, iburpofen, then as needed daily for pain  . methocarbamol (ROBAXIN) 500 MG tablet TAKE 1 TABLET BY MOUTH AT  BEDTIME  . montelukast (SINGULAIR) 10 MG tablet TAKE 1 TABLET BY MOUTH AT  BEDTIME  . Multiple Vitamin (MULTIVITAMIN WITH MINERALS) TABS tablet Take 1 tablet by mouth daily.  . potassium chloride (K-DUR,KLOR-CON) 10 MEQ tablet TAKE 1 TABLET BY MOUTH 3  TIMES DAILY  . TESTIM 50 MG/5GM (1%) GEL APPLY CONTENTS OF 1 TUBE  TOPICALLY DAILY  .  valsartan-hydrochlorothiazide (DIOVAN-HCT) 160-25 MG tablet TAKE 1 TABLET BY MOUTH  DAILY FOR BLOOD PRESSURE  . vitamin C (ASCORBIC ACID) 500 MG tablet Take 500 mg by mouth daily.   . Vitamin D, Ergocalciferol, (DRISDOL) 50000 units CAPS capsule TAKE 1 CAPSULE BY MOUTH  DAILY   Current Facility-Administered Medications on File Prior to Visit  Medication  . 0.9 %  sodium chloride infusion     Allergies: No Known Allergies   Medical History:  Past Medical History:  Diagnosis Date  . Allergy   . Anxiety   . Asthma   . Barrett's esophagus   . GERD (gastroesophageal reflux disease)   . Gout   . H/O hiatal hernia   . Hyperlipidemia   . Hypertension   . Prediabetes   . Sleep apnea   . Vitamin D deficiency    Family history- Reviewed and unchanged Social history- Reviewed and unchanged   Review of Systems:  Review of Systems  Constitutional: Negative for malaise/fatigue and weight loss.  HENT: Negative for hearing loss and tinnitus.   Eyes: Negative for blurred vision and double vision.  Respiratory: Negative for cough, shortness of breath and wheezing.   Cardiovascular: Negative for chest pain, palpitations, orthopnea, claudication and leg swelling.  Gastrointestinal: Negative for abdominal pain, blood in stool, constipation, diarrhea, heartburn, melena, nausea and vomiting.  Genitourinary: Negative.   Musculoskeletal: Positive for joint pain (Right hip). Negative for back pain, falls and myalgias.  Skin: Negative for rash.  Neurological: Negative for dizziness, tingling, sensory change, weakness and headaches.  Endo/Heme/Allergies: Negative for polydipsia.  Psychiatric/Behavioral: Negative.   All other systems reviewed and are negative.   Physical Exam: BP (!) 142/84   Pulse 84   Temp 97.7 F (36.5 C)   Ht 6' (1.829 m)   Wt (!) 316 lb (143.3 kg)   SpO2 95%   BMI 42.86 kg/m  Wt Readings from Last 3 Encounters:  01/18/18 (!) 316 lb (143.3 kg)  12/29/17 (!) 311 lb  (141.1 kg)  10/12/17 (!) 306 lb 3.2 oz (138.9 kg)   General Appearance: Well nourished, in no apparent distress. Eyes: PERRLA, EOMs, conjunctiva no swelling or erythema Sinuses: No Frontal/maxillary tenderness ENT/Mouth: Ext aud canals clear, TMs without erythema, bulging. No erythema, swelling, or exudate on post pharynx.  Tonsils not swollen or erythematous. Hearing normal.  Neck: Supple, thyroid normal.  Respiratory: Respiratory effort normal, BS equal bilaterally without rales, rhonchi, wheezing or stridor.  Cardio: RRR with no MRGs. Brisk peripheral pulses without edema.  Abdomen: Soft, + BS.  Non tender, no guarding, rebound, hernias, masses. Lymphatics: Non tender without lymphadenopathy.  Musculoskeletal: Full ROM, 5/5 strength, Normal gait Skin: Warm, dry without rashes, lesions, ecchymosis.  Neuro: Cranial nerves intact. No cerebellar symptoms.  Psych: Awake and oriented X 3, normal affect, Insight and Judgment appropriate.   Izora Ribas, NP 10:03 AM [facility]

## 2018-01-18 ENCOUNTER — Encounter: Payer: Self-pay | Admitting: Adult Health

## 2018-01-18 ENCOUNTER — Other Ambulatory Visit: Payer: Self-pay | Admitting: Adult Health

## 2018-01-18 ENCOUNTER — Ambulatory Visit: Payer: 59 | Admitting: Adult Health

## 2018-01-18 VITALS — BP 142/84 | HR 84 | Temp 97.7°F | Ht 72.0 in | Wt 316.0 lb

## 2018-01-18 DIAGNOSIS — M1000 Idiopathic gout, unspecified site: Secondary | ICD-10-CM

## 2018-01-18 DIAGNOSIS — N183 Chronic kidney disease, stage 3 (moderate): Secondary | ICD-10-CM

## 2018-01-18 DIAGNOSIS — R7303 Prediabetes: Secondary | ICD-10-CM

## 2018-01-18 DIAGNOSIS — Z79899 Other long term (current) drug therapy: Secondary | ICD-10-CM | POA: Diagnosis not present

## 2018-01-18 DIAGNOSIS — E349 Endocrine disorder, unspecified: Secondary | ICD-10-CM

## 2018-01-18 DIAGNOSIS — E782 Mixed hyperlipidemia: Secondary | ICD-10-CM | POA: Diagnosis not present

## 2018-01-18 DIAGNOSIS — I10 Essential (primary) hypertension: Secondary | ICD-10-CM | POA: Diagnosis not present

## 2018-01-18 DIAGNOSIS — F418 Other specified anxiety disorders: Secondary | ICD-10-CM | POA: Diagnosis not present

## 2018-01-18 DIAGNOSIS — K219 Gastro-esophageal reflux disease without esophagitis: Secondary | ICD-10-CM

## 2018-01-18 DIAGNOSIS — N1831 Chronic kidney disease, stage 3a: Secondary | ICD-10-CM

## 2018-01-18 DIAGNOSIS — E559 Vitamin D deficiency, unspecified: Secondary | ICD-10-CM

## 2018-01-18 DIAGNOSIS — M25552 Pain in left hip: Secondary | ICD-10-CM

## 2018-01-18 NOTE — Patient Instructions (Signed)
Try an OTC antihistamine eye drop - such as alaway allergy drops   Allergic Conjunctivitis A clear membrane (conjunctiva) covers the white part of your eye and the inner surface of your eyelid. Allergic conjunctivitis happens when this membrane has inflammation. This is caused by allergies. Common causes of allergic reactions (allergens)include:  Outdoor allergens, such as: ? Pollen. ? Grass and weeds. ? Mold spores.  Indoor allergens, such as: ? Dust. ? Smoke. ? Mold. ? Pet dander. ? Animal hair.  This condition can make your eye red or pink. It can also make your eye feel itchy. This condition cannot be spread from one person to another person (is not contagious). Follow these instructions at home:  Try not to be around things that you are allergic to.  Take or apply over-the-counter and prescription medicines only as told by your doctor. These include any eye drops.  Place a cool, clean washcloth on your eye for 10-20 minutes. Do this 3-4 times a day.  Do not touch or rub your eyes.  Do not wear contact lenses until the inflammation is gone. Wear glasses instead.  Do not wear eye makeup until the inflammation is gone.  Keep all follow-up visits as told by your doctor. This is important. Contact a doctor if:  Your symptoms get worse.  Your symptoms do not get better with treatment.  You have mild eye pain.  You are sensitive to light,  You have spots or blisters on your eyes.  You have pus coming from your eye.  You have a fever. Get help right away if:  You have redness, swelling, or other symptoms in only one eye.  Your vision is blurry.  You have vision changes.  You have very bad eye pain. Summary  Allergic conjunctivitis is caused by allergies. It can make your eye red or pink, and it can make your eye feel itchy.  This condition cannot be spread from one person to another person (is not contagious).  Try not to be around things that you are  allergic to.  Take or apply over-the-counter and prescription medicines only as told by your doctor. These include any eye drops.  Contact your doctor if your symptoms get worse or they do not get better with treatment. This information is not intended to replace advice given to you by your health care provider. Make sure you discuss any questions you have with your health care provider. Document Released: 02/05/2010 Document Revised: 04/11/2016 Document Reviewed: 04/11/2016 Elsevier Interactive Patient Education  2017 [employer].

## 2018-01-19 ENCOUNTER — Encounter (INDEPENDENT_AMBULATORY_CARE_PROVIDER_SITE_OTHER): Payer: Self-pay

## 2018-01-19 LAB — CBC WITH DIFFERENTIAL/PLATELET
BASOS ABS: 31 {cells}/uL (ref 0–200)
Basophils Relative: 0.5 %
EOS PCT: 2.7 %
Eosinophils Absolute: 167 cells/uL (ref 15–500)
HCT: 41.4 % (ref 38.5–50.0)
Hemoglobin: 14.3 g/dL (ref 13.2–17.1)
Lymphs Abs: 1705 cells/uL (ref 850–3900)
MCH: 30.1 pg (ref 27.0–33.0)
MCHC: 34.5 g/dL (ref 32.0–36.0)
MCV: 87.2 fL (ref 80.0–100.0)
MONOS PCT: 12.4 %
MPV: 11 fL (ref 7.5–12.5)
NEUTROS ABS: 3528 {cells}/uL (ref 1500–7800)
NEUTROS PCT: 56.9 %
Platelets: 132 10*3/uL — ABNORMAL LOW (ref 140–400)
RBC: 4.75 10*6/uL (ref 4.20–5.80)
RDW: 14.2 % (ref 11.0–15.0)
Total Lymphocyte: 27.5 %
WBC mixed population: 769 cells/uL (ref 200–950)
WBC: 6.2 10*3/uL (ref 3.8–10.8)

## 2018-01-19 LAB — COMPLETE METABOLIC PANEL WITH GFR
AG Ratio: 1.6 (calc) (ref 1.0–2.5)
ALKALINE PHOSPHATASE (APISO): 47 U/L (ref 40–115)
ALT: 20 U/L (ref 9–46)
AST: 19 U/L (ref 10–35)
Albumin: 4.2 g/dL (ref 3.6–5.1)
BILIRUBIN TOTAL: 0.6 mg/dL (ref 0.2–1.2)
BUN: 13 mg/dL (ref 7–25)
CO2: 31 mmol/L (ref 20–32)
CREATININE: 1.09 mg/dL (ref 0.70–1.25)
Calcium: 9.2 mg/dL (ref 8.6–10.3)
Chloride: 102 mmol/L (ref 98–110)
GFR, Est African American: 85 mL/min/{1.73_m2} (ref >=60)
GFR, Est Non African American: 73 mL/min/{1.73_m2} (ref >=60)
GLUCOSE: 110 mg/dL — AB (ref 65–99)
Globulin: 2.7 g/dL (calc) (ref 1.9–3.7)
Potassium: 4 mmol/L (ref 3.5–5.3)
SODIUM: 139 mmol/L (ref 135–146)
Total Protein: 6.9 g/dL (ref 6.1–8.1)

## 2018-01-19 LAB — LIPID PANEL
Cholesterol: 154 mg/dL (ref <200)
HDL: 44 mg/dL (ref >40)
LDL CHOLESTEROL (CALC): 89 mg/dL
NON-HDL CHOLESTEROL (CALC): 110 mg/dL (ref <130)
Total CHOL/HDL Ratio: 3.5 (calc) (ref <5.0)
Triglycerides: 118 mg/dL (ref <150)

## 2018-01-19 LAB — MAGNESIUM: Magnesium: 1.8 mg/dL (ref 1.5–2.5)

## 2018-01-19 LAB — HEMOGLOBIN A1C
EAG (MMOL/L): 6.6 (calc)
Hgb A1c MFr Bld: 5.8 % of total Hgb — ABNORMAL HIGH (ref <5.7)
Mean Plasma Glucose: 120 (calc)

## 2018-01-19 LAB — TSH: TSH: 1.29 m[IU]/L (ref 0.40–4.50)

## 2018-01-28 ENCOUNTER — Other Ambulatory Visit: Payer: Self-pay | Admitting: Internal Medicine

## 2018-02-02 ENCOUNTER — Encounter (INDEPENDENT_AMBULATORY_CARE_PROVIDER_SITE_OTHER): Payer: Self-pay

## 2018-02-26 ENCOUNTER — Ambulatory Visit (HOSPITAL_COMMUNITY)
Admission: EM | Admit: 2018-02-26 | Discharge: 2018-02-26 | Disposition: A | Discharge status: Home / Self Care | Payer: 59 | Attending: Family Medicine | Admitting: Family Medicine

## 2018-02-26 ENCOUNTER — Encounter (HOSPITAL_COMMUNITY): Payer: Self-pay | Admitting: Emergency Medicine

## 2018-02-26 DIAGNOSIS — M5442 Lumbago with sciatica, left side: Secondary | ICD-10-CM

## 2018-02-26 MED ORDER — KETOROLAC TROMETHAMINE 60 MG/2ML IM SOLN: 60.0000 mg | Freq: Once | INTRAMUSCULAR | Status: DC

## 2018-02-26 MED ORDER — METHYLPREDNISOLONE SODIUM SUCC 125 MG IJ SOLR
125.0000 mg | Freq: Once | INTRAMUSCULAR | Status: AC
  Administered 2018-02-26: 125 mg via INTRAMUSCULAR

## 2018-02-26 MED ORDER — PREDNISONE 10 MG PO TABS: 20.0000 mg | ORAL_TABLET | Freq: Two times a day (BID) | ORAL | 0 refills | Status: AC

## 2018-02-26 MED ORDER — METHYLPREDNISOLONE SODIUM SUCC 125 MG IJ SOLR
INTRAMUSCULAR | Status: AC
  Filled 2018-02-26: qty 2

## 2018-02-26 NOTE — ED Triage Notes (Signed)
Pt states hes had cramps in his L upper leg, and pain in his L lower back. Pt states hes had xrays and CT done.

## 2018-02-26 NOTE — ED Provider Notes (Addendum)
[place]   [id number] 02/26/18 Arrival Time: 4098  SUBJECTIVE: History from: patient. Christopher [name] is a 62 y.o. male complains of left lower back pain that began 2 months ago and gotten progressively worse within the last couple of days.  Denies a precipitating event or specific injury.  Localizes the pain to the left low back and radiates towards the right hip.  Describes the pain as constant and sharp in character.  Has tried OTC medications ibuprofen, tylenol, mobic, and robaxin without relief.  Symptoms are made worse with ROM.  Denies similar symptoms in the past.  Denies fever, chills, erythema, ecchymosis, effusion, weakness, numbness and tingling, or urinary symptoms.      ROS: As per HPI.  Past Medical History:  Diagnosis Date  . Allergy   . Anxiety   . Asthma   . Barrett's esophagus   . GERD (gastroesophageal reflux disease)   . Gout   . H/O hiatal hernia   . Hyperlipidemia   . Hypertension   . Prediabetes   . Sleep apnea   . Vitamin D deficiency    Past Surgical History:  Procedure Laterality Date  . CLOSED REDUCTION NASAL FRACTURE N/A 06/10/2016   Procedure: CLOSED REDUCTION NASAL FRACTURE FOREHEAD SUTURE REMOVAL;  Surgeon: [name], DO;  Location: [facility];  Service: Plastics;  Laterality: N/A;  . COLONOSCOPY    . [place]  . Left knee Arthroscopy    . Right knee arthroscopy    . UPPER GASTROINTESTINAL ENDOSCOPY     last egd 12-7*2012   No Known Allergies Current Facility-Administered Medications on File Prior to Encounter  Medication Dose Route Frequency Provider Last Rate Last Dose  . 0.9 %  sodium chloride infusion  500 mL Intravenous Once Doran Stabler, MD       Current Outpatient Medications on File Prior to Encounter  Medication Sig Dispense Refill  . 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 90 tablet 1  . allopurinol (ZYLOPRIM) 300 MG tablet TAKE 1 TABLET BY  MOUTH  DAILY FOR GOUT PREVENTION 90 tablet 1  . ALPRAZolam (XANAX) 1 MG tablet 1/2 TO 1 TAB TWO TO THREE  TIMES DAILY ONLY IF NEEDED  FOR ANXIETY ATTACK. TRY AND LIMIT TO 5 DAYS/WEEK TO  AVOID ADDICTION 90 tablet 0  . aspirin EC 81 MG tablet Take 81 mg by mouth daily.    Marland Kitchen buPROPion (WELLBUTRIN XL) 300 MG 24 hr tablet TAKE 1 TABLET BY MOUTH  EVERY MORNING 90 tablet 1  . Cyanocobalamin (VITAMIN B 12 PO) Take 1 tablet by mouth daily.      Marland Kitchen esomeprazole (NEXIUM) 40 MG capsule Take 1 capsule 2 x / day for Barrett's Esophagus 180 capsule 3  . fenofibrate 160 MG tablet TAKE 1 TABLET BY MOUTH  DAILY 90 tablet 1  . fish oil-omega-3 fatty acids 1000 MG capsule Take 1 g by mouth daily.     . Flaxseed, Linseed, (FLAX SEEDS PO) Take by mouth.    . furosemide (LASIX) 20 MG tablet TAKE 1 TABLET BY MOUTH  DAILY 90 tablet 1  . magnesium oxide (MAG-OX) 400 MG tablet Take 400 mg by mouth 2 (two) times daily.     . meloxicam (MOBIC) 15 MG tablet TAKE 1 TABLET BY MOUTH DAILY WITH FOOD FOR 2 WEEKS, CAN TAKE WITH TYLENOL, CAN TAKE THE ALEVE, IBUPROFEN, THEN DAILY AS NEEDED FOR PAIN 90 tablet  1  . methocarbamol (ROBAXIN) 500 MG tablet TAKE 1 TABLET BY MOUTH AT  BEDTIME 90 tablet 1  . montelukast (SINGULAIR) 10 MG tablet TAKE 1 TABLET BY MOUTH AT  BEDTIME 90 tablet 1  . Multiple Vitamin (MULTIVITAMIN WITH MINERALS) TABS tablet Take 1 tablet by mouth daily.    . potassium chloride (K-DUR,KLOR-CON) 10 MEQ tablet TAKE 1 TABLET BY MOUTH 3  TIMES DAILY 270 tablet 1  . TESTIM 50 MG/5GM (1%) GEL APPLY CONTENTS OF 1 TUBE  TOPICALLY DAILY 450 g 1  . valsartan-hydrochlorothiazide (DIOVAN-HCT) 160-25 MG tablet TAKE 1 TABLET BY MOUTH  DAILY FOR BLOOD PRESSURE 90 tablet 1  . vitamin C (ASCORBIC ACID) 500 MG tablet Take 500 mg by mouth daily.     . Vitamin D, Ergocalciferol, (DRISDOL) 50000 units CAPS capsule TAKE 1 CAPSULE BY MOUTH  DAILY 90 capsule 1   Social History   Socioeconomic History  . Marital status: Single    Spouse  name: Not on file  . Number of children: 1  . Years of education: Not on file  . Highest education level: Not on file  Occupational History  . Occupation: Event organiser  Social Needs  . Financial resource strain: Not on file  . Food insecurity:    Worry: Not on file    Inability: Not on file  . Transportation needs:    Medical: Not on file    Non-medical: Not on file  Tobacco Use  . Smoking status: Former Smoker    Years: 6.00    Types: Cigarettes    Last attempt to quit: 02/07/2013    Years since quitting: 5.0  . Smokeless tobacco: Never Used  Substance and Sexual Activity  . Alcohol use: No    Alcohol/week: 0.0 oz  . Drug use: No  . Sexual activity: Yes    Partners: Male  Lifestyle  . Physical activity:    Days per week: Not on file    Minutes per session: Not on file  . Stress: Not on file  Relationships  . Social connections:    Talks on phone: Not on file    Gets together: Not on file    Attends religious service: Not on file    Active member of club or organization: Not on file    Attends meetings of clubs or organizations: Not on file    Relationship status: Not on file  . Intimate partner violence:    Fear of current or ex partner: Not on file    Emotionally abused: Not on file    Physically abused: Not on file    Forced sexual activity: Not on file  Other Topics Concern  . Not on file  Social History Narrative  . Not on file   Family History  Problem Relation Age of Onset  . Arthritis Mother   . Lung cancer Mother   . Heart disease Mother   . Colon cancer Maternal Uncle   . Stroke Brother   . Prostate cancer Father   . Esophageal cancer Neg Hx   . Rectal cancer Neg Hx   . Stomach cancer Neg Hx   . Liver cancer Neg Hx   . Pancreatic cancer Neg Hx     OBJECTIVE:  Vitals:   02/26/18 1611  BP: (!) 142/103  Pulse: 79  Resp: 18  Temp: 98.2 F (36.8 C)  SpO2: 95%    General appearance: AOx3; appears uncomfortable Head: NCAT Lungs: CTA  bilaterally Heart: Systolic murmur  aortic region.  Radial pulses 2+ bilaterally. Musculoskeletal: Back Inspection: Skin warm, dry, clear and intact without obvious erythema, effusion, or ecchymosis.  Palpation: Left paravertebral muscle ROM: FROM active and passive Strength: 5/5 shld abduction, 5/5 shld adduction, 5/5 elbow flexion, 5/5 elbow extension, 5/5 grip strength, 5/5 hip flexion, 5/5 knee abduction, 5/5 knee adduction, 5/5 knee flexion, 5/5 knee extension, 5/5 dorsiflexion, 5/5 plantar flexion +SLR Skin: warm and dry Neurologic: Ambulates without antalgic gait; Sensation intact about the upper/ lower extremities Psychological: alert and cooperative; normal mood and affect  ASSESSMENT & PLAN:  1. Acute left-sided low back pain with left-sided sciatica     Meds ordered this encounter  Medications  . DISCONTD: ketorolac (TORADOL) injection 60 mg  . methylPREDNISolone sodium succinate (SOLU-MEDROL) 125 mg/2 mL injection 125 mg  . predniSONE (DELTASONE) 10 MG tablet    Sig: Take 2 tablets (20 mg total) by mouth 2 (two) times daily with a meal for 5 days.    Dispense:  20 tablet    Refill:  0    Order Specific Question:   Supervising Provider    Answer:   Wynona Luna [[id number]]   Continue conservative management of rest, ice, heat, and gentle stretches Steroid shot given Prednisone prescribed.  Take as directed and to completion.   Continue mobic as prescribed Continue robaxin as prescribed Follow up with PCP if symptoms persist Return or go to the ER if you have any new or worsening symptoms (fever, chills, chest pain, abdominal pain, changes in bowel or bladder habits, pain radiating into lower legs, etc...)   Reviewed expectations re: course of current medical issues. Questions answered. Outlined signs and symptoms indicating need for more acute intervention. Patient verbalized understanding. After Visit Summary given.    Lestine Box, PA-C 02/26/18 [place], [place], PA-C 02/26/18 1659

## 2018-02-26 NOTE — Discharge Instructions (Addendum)
Continue conservative management of rest, ice, heat, and gentle stretches Steroid shot given Prednisone prescribed.  Take as directed and to completion.   Continue mobic as prescribed Continue robaxin as prescribed Follow up with PCP if symptoms persist Return or go to the ER if you have any new or worsening symptoms (fever, chills, chest pain, abdominal pain, changes in bowel or bladder habits, pain radiating into lower legs, etc...)

## 2018-02-28 ENCOUNTER — Encounter (HOSPITAL_COMMUNITY): Payer: Self-pay | Admitting: Emergency Medicine

## 2018-02-28 ENCOUNTER — Emergency Department (HOSPITAL_COMMUNITY)
Admission: EM | Admit: 2018-02-28 | Discharge: 2018-02-28 | Disposition: A | Discharge status: Home / Self Care | Payer: 59 | Attending: Emergency Medicine | Admitting: Emergency Medicine

## 2018-02-28 ENCOUNTER — Other Ambulatory Visit: Payer: Self-pay

## 2018-02-28 DIAGNOSIS — R252 Cramp and spasm: Secondary | ICD-10-CM

## 2018-02-28 DIAGNOSIS — Z79899 Other long term (current) drug therapy: Secondary | ICD-10-CM | POA: Diagnosis not present

## 2018-02-28 DIAGNOSIS — Z87891 Personal history of nicotine dependence: Secondary | ICD-10-CM | POA: Diagnosis not present

## 2018-02-28 DIAGNOSIS — M79652 Pain in left thigh: Secondary | ICD-10-CM | POA: Diagnosis not present

## 2018-02-28 DIAGNOSIS — I129 Hypertensive chronic kidney disease with stage 1 through stage 4 chronic kidney disease, or unspecified chronic kidney disease: Secondary | ICD-10-CM | POA: Diagnosis not present

## 2018-02-28 DIAGNOSIS — M79606 Pain in leg, unspecified: Secondary | ICD-10-CM | POA: Diagnosis not present

## 2018-02-28 DIAGNOSIS — N183 Chronic kidney disease, stage 3 (moderate): Secondary | ICD-10-CM | POA: Insufficient documentation

## 2018-02-28 DIAGNOSIS — J45909 Unspecified asthma, uncomplicated: Secondary | ICD-10-CM | POA: Diagnosis not present

## 2018-02-28 LAB — I-STAT CHEM 8, ED
BUN: 19 mg/dL (ref 6–20)
CALCIUM ION: 1.19 mmol/L (ref 1.15–1.40)
CHLORIDE: 100 mmol/L (ref 98–111)
CREATININE: 1 mg/dL (ref 0.61–1.24)
GLUCOSE: 126 mg/dL — AB (ref 70–99)
HCT: 38 % — ABNORMAL LOW (ref 39.0–52.0)
Hemoglobin: 12.9 g/dL — ABNORMAL LOW (ref 13.0–17.0)
POTASSIUM: 3.7 mmol/L (ref 3.5–5.1)
Sodium: 139 mmol/L (ref 135–145)
TCO2: 24 mmol/L (ref 22–32)

## 2018-02-28 MED ORDER — HYDROCODONE-ACETAMINOPHEN 5-325 MG PO TABS
1.0000 | ORAL_TABLET | Freq: Once | ORAL | Status: AC
  Administered 2018-02-28: 1 via ORAL
  Filled 2018-02-28: qty 1

## 2018-02-28 MED ORDER — KETOROLAC TROMETHAMINE 15 MG/ML IJ SOLN
15.0000 mg | Freq: Once | INTRAMUSCULAR | Status: AC
  Administered 2018-02-28: 15 mg via INTRAVENOUS
  Filled 2018-02-28: qty 1

## 2018-02-28 MED ORDER — ORPHENADRINE CITRATE 30 MG/ML IJ SOLN
60.0000 mg | Freq: Once | INTRAMUSCULAR | Status: AC
  Administered 2018-02-28: 60 mg via INTRAVENOUS
  Filled 2018-02-28: qty 2

## 2018-02-28 MED ORDER — CYCLOBENZAPRINE HCL 10 MG PO TABS: 10.0000 mg | ORAL_TABLET | Freq: Every day | ORAL | 0 refills | Status: DC

## 2018-02-28 MED ORDER — METHOCARBAMOL 1000 MG/10ML IJ SOLN: 500.0000 mg | Freq: Once | INTRAMUSCULAR | Status: DC

## 2018-02-28 MED ORDER — HYDROMORPHONE HCL 1 MG/ML IJ SOLN
1.0000 mg | Freq: Once | INTRAMUSCULAR | Status: AC
  Administered 2018-02-28: 1 mg via INTRAVENOUS
  Filled 2018-02-28: qty 1

## 2018-02-28 MED ORDER — METHOCARBAMOL 1000 MG/10ML IJ SOLN
500.0000 mg | Freq: Once | INTRAVENOUS | Status: AC
  Administered 2018-02-28: 500 mg via INTRAVENOUS
  Filled 2018-02-28: qty 550

## 2018-02-28 NOTE — ED Triage Notes (Signed)
Patient is stating that he has has left upper and lower thigh pain. Patient states that he has had this pain for two months. Patient states it got worse tonight. Patient states it is a cramp that want let go.

## 2018-02-28 NOTE — ED Provider Notes (Signed)
Christopher [name]  CSN: [id number] Arrival date & time: 02/28/18 7169  Chief Complaint(s) Leg Pain  HPI Christopher [name] is a 62 y.o. male with a history of muscle cramps on Robaxin presents to the emergency department with left thigh muscle cramping that began several days prior to arrival.  Patient reports that cramping has been intermittent for several months located to both lower extremities.  He reports spontaneous exacerbations.  Patient reports that he took his nighttime Robaxin at night but was woken up by the pain. He tried to stretch, but when he was straightening his leg the pain became severe.  Pain is constant but fluctuating in nature.  Exacerbated with movement and range of motion of the left leg.  He denies any trauma.  No paresthesias.  No lower extremity weakness.  No swelling.   HPI  Past Medical History Past Medical History:  Diagnosis Date  . Allergy   . Anxiety   . Asthma   . Barrett's esophagus   . GERD (gastroesophageal reflux disease)   . Gout   . H/O hiatal hernia   . Hyperlipidemia   . Hypertension   . Prediabetes   . Sleep apnea   . Vitamin D deficiency    Patient Active Problem List   Diagnosis Date Noted  . Depression with anxiety 01/15/2018  . Aorto-iliac atherosclerosis ([place]) 01/10/2018  . Internal hemorrhoids 03/09/2015  . Testosterone deficiency 04/18/2014  . Medication management 01/12/2014  . Morbid obesity ([place]) 01/12/2014  . Stage 3 CKD (GFR 56 ml/min) 01/12/2014  . Prediabetes   . Gout   . Vitamin D deficiency   . Essential hypertension 08/17/2012  . Hyperlipidemia 08/17/2012  . History of tobacco abuse 08/17/2012  . GERD 08/17/2012   Home Medication(s) Prior to Admission medications   Medication Sig Start Date End Date Taking? Authorizing Provider  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 08/16/17  Yes Unk Pinto, MD  allopurinol (ZYLOPRIM) 300 MG  tablet TAKE 1 TABLET BY MOUTH  DAILY FOR GOUT PREVENTION 10/09/17  Yes Unk Pinto, MD  ALPRAZolam (XANAX) 1 MG tablet 1/2 TO 1 TAB TWO TO THREE  TIMES DAILY ONLY IF NEEDED  FOR ANXIETY ATTACK. TRY AND LIMIT TO 5 DAYS/WEEK TO  AVOID ADDICTION 12/06/17  Yes Unk Pinto, MD  aspirin EC 81 MG tablet Take 81 mg by mouth daily.   Yes [provider]  buPROPion (WELLBUTRIN XL) 300 MG 24 hr tablet TAKE 1 TABLET BY MOUTH  EVERY MORNING 01/28/18  Yes Unk Pinto, MD  Cyanocobalamin (VITAMIN B 12 PO) Take 1 tablet by mouth daily.     Yes [provider]  esomeprazole (NEXIUM) 40 MG capsule Take 1 capsule 2 x / day for Barrett's Esophagus 10/12/17 10/12/18 Yes Unk Pinto, MD  fenofibrate 160 MG tablet TAKE 1 TABLET BY MOUTH  DAILY 01/28/18  Yes Unk Pinto, MD  fish oil-omega-3 fatty acids 1000 MG capsule Take 1 g by mouth daily.    Yes [provider]  Flaxseed, Linseed, (FLAX SEEDS PO) Take by mouth.   Yes [provider]  furosemide (LASIX) 20 MG tablet TAKE 1 TABLET BY MOUTH  DAILY Patient taking differently: TAKE 1 TABLET BY MOUTH  DAILY prn 08/16/17  Yes Unk Pinto, MD  magnesium oxide (MAG-OX) 400 MG tablet Take 400 mg by mouth 2 (two) times daily.    Yes [provider]  meloxicam (MOBIC) 15 MG  tablet TAKE 1 TABLET BY MOUTH DAILY WITH FOOD FOR 2 WEEKS, CAN TAKE WITH TYLENOL, CAN TAKE THE ALEVE, IBUPROFEN, THEN DAILY AS NEEDED FOR PAIN 01/18/18  Yes Unk Pinto, MD  methocarbamol (ROBAXIN) 500 MG tablet TAKE 1 TABLET BY MOUTH AT  BEDTIME 08/16/17  Yes Unk Pinto, MD  Multiple Vitamin (MULTIVITAMIN WITH MINERALS) TABS tablet Take 1 tablet by mouth daily.   Yes [provider]  potassium chloride (K-DUR,KLOR-CON) 10 MEQ tablet TAKE 1 TABLET BY MOUTH 3  TIMES DAILY 10/09/17  Yes Unk Pinto, MD  predniSONE (DELTASONE) 10 MG tablet Take 2 tablets (20 mg total) by mouth 2 (two) times daily with a meal for 5 days.  02/26/18 03/03/18 Yes [place], [place], PA-C  TESTIM 50 MG/5GM (1%) GEL APPLY CONTENTS OF 1 TUBE  TOPICALLY DAILY 08/16/17  Yes Unk Pinto, MD  valsartan-hydrochlorothiazide (DIOVAN-HCT) 160-25 MG tablet TAKE 1 TABLET BY MOUTH  DAILY FOR BLOOD PRESSURE 10/09/17  Yes Unk Pinto, MD  vitamin C (ASCORBIC ACID) 500 MG tablet Take 500 mg by mouth daily.    Yes [provider]  Vitamin D, Ergocalciferol, (DRISDOL) 50000 units CAPS capsule TAKE 1 CAPSULE BY MOUTH  DAILY 12/06/17  Yes Unk Pinto, MD  cyclobenzaprine (FLEXERIL) 10 MG tablet Take 1 tablet (10 mg total) by mouth at bedtime for 10 days. 02/28/18 03/10/18  Fatima Blank, MD  montelukast (SINGULAIR) 10 MG tablet TAKE 1 TABLET BY MOUTH AT  BEDTIME Patient not taking: Reported on 02/28/2018 10/09/17   Unk Pinto, MD                                                                                                                                    Past Surgical History Past Surgical History:  Procedure Laterality Date  . CLOSED REDUCTION NASAL FRACTURE N/A 06/10/2016   Procedure: CLOSED REDUCTION NASAL FRACTURE FOREHEAD SUTURE REMOVAL;  Surgeon: [name], DO;  Location: [facility];  Service: Plastics;  Laterality: N/A;  . COLONOSCOPY    . [place]  . Left knee Arthroscopy    . Right knee arthroscopy    . UPPER GASTROINTESTINAL ENDOSCOPY     last egd 12-7*2012   Family History Family History  Problem Relation Age of Onset  . Arthritis Mother   . Lung cancer Mother   . Heart disease Mother   . Colon cancer Maternal Uncle   . Stroke Brother   . Prostate cancer Father   . Esophageal cancer Neg Hx   . Rectal cancer Neg Hx   . Stomach cancer Neg Hx   . Liver cancer Neg Hx   . Pancreatic cancer Neg Hx     Social History Social History   Tobacco Use  . Smoking status: Former Smoker    Years: 6.00    Types: Cigarettes    Last attempt to quit: 02/07/2013    Years  since  quitting: 5.0  . Smokeless tobacco: Never Used  Substance Use Topics  . Alcohol use: No    Alcohol/week: 0.0 oz  . Drug use: No   Allergies Patient has no known allergies.  Review of Systems Review of Systems All other systems are reviewed and are negative for acute change except as noted in the HPI  Physical Exam Vital Signs  I have reviewed the triage vital signs BP (!) 140/93   Pulse 87   Temp 98.2 F (36.8 C) (Oral)   Resp (!) 23   Ht 6' (1.829 m)   Wt (!) 141.1 kg (311 lb)   SpO2 93%   BMI 42.18 kg/m   Physical Exam  Constitutional: He is oriented to person, place, and time. He appears well-developed and well-nourished. No distress.  HENT:  Head: Normocephalic and atraumatic.  Right Ear: External ear normal.  Left Ear: External ear normal.  Nose: Nose normal.  Mouth/Throat: Mucous membranes are normal. No trismus in the jaw.  Eyes: Conjunctivae and EOM are normal. No scleral icterus.  Neck: Normal range of motion and phonation normal.  Cardiovascular: Normal rate and regular rhythm.  Pulses:      Dorsalis pedis pulses are 2+ on the right side, and 2+ on the left side.  Pulmonary/Chest: Effort normal. No stridor. No respiratory distress.  Abdominal: He exhibits no distension.  Musculoskeletal: Normal range of motion. He exhibits no edema.       Left upper leg: He exhibits tenderness. He exhibits no swelling, no edema, no deformity and no laceration.       Legs: Neurological: He is alert and oriented to person, place, and time.  Skin: He is not diaphoretic.  Psychiatric: He has a normal mood and affect. His behavior is normal.  Vitals reviewed.   ED Results and Treatments Labs (all labs ordered are listed, but only abnormal results are displayed) Labs Reviewed  I-STAT CHEM 8, ED - Abnormal; Notable for the following components:      Result Value   Glucose, Bld 126 (*)    Hemoglobin 12.9 (*)    HCT 38.0 (*)    All other components within normal  limits                                                                                                                         EKG  EKG Interpretation  Date/Time:    Ventricular Rate:    PR Interval:    QRS Duration:   QT Interval:    QTC Calculation:   R Axis:     Text Interpretation:        Radiology No results found. Pertinent labs & imaging results that were available during my care of the patient were reviewed by me and considered in my medical decision making (see chart for details).  Medications Ordered in ED Medications  orphenadrine (NORFLEX) injection 60 mg (60 mg Intravenous Given 02/28/18 0411)  ketorolac (TORADOL) 15 MG/ML injection 15 mg (  15 mg Intravenous Given 02/28/18 0348)  HYDROmorphone (DILAUDID) injection 1 mg (1 mg Intravenous Given 02/28/18 0449)  methocarbamol (ROBAXIN) 500 mg in dextrose 5 % 50 mL IVPB (0 mg Intravenous Stopped 02/28/18 0649)                                                                                                                                    Procedures Procedures  (including critical care time)  Medical Decision Making / ED Course I have reviewed the nursing notes for this encounter and the patient's prior records (if available in EHR or on provided paperwork).  Clinical Course as of Mar 01 731  Sun Feb 28, 2018  0350 Consistent with muscular spasm.  On review of records from May of this year, patient did not have any electrolyte derangement such as hypokalemia or hypocalcemia.  Doubt DVT.  Will treat patient with IV Toradol Norflex.   [PC]  1610 Minimal improvement. Given IV dilaudid   [PC]  [id number] Patient also given IV Robaxin and heat was applied to the extremity.  There was some improvement with this patient was noted to be sleeping on reevaluation.  When awakened he complained of persistent pain.  Labs obtained and did not reveal any significant electrolyte derangements that would be causing the patient's  symptomatology.  Medication reviewed and no notable medications that would cause dystonic reaction.  He denies any injuries that would cause tetany.  The patient appears reasonably screened and/or stabilized for discharge and I doubt any other medical condition or other [facility] requiring further screening, evaluation, or treatment in the ED at this time prior to discharge.  The patient is safe for discharge with strict return precautions.    [PC]    Clinical Course User Index [PC] [place], Grayce Sessions, MD     Final Clinical Impression(s) / ED Diagnoses Final diagnoses:  Leg cramps   Disposition: Discharge  Condition: Good  I have discussed the results, Dx and Tx plan with the patient who expressed understanding and agree(s) with the plan. Discharge instructions discussed at great length. The patient was given strict return precautions who verbalized understanding of the instructions. No further questions at time of discharge.    ED Discharge Orders        [place]     02/28/18 [phone]    Face-to-face encounter (required for Medicare/Medicaid patients)    Comments:  I Fatima Blank certify that this patient is under my care and that I, or a nurse practitioner or physician's assistant working with me, had a face-to-face encounter that meets the physician face-to-face encounter requirements with this patient on 02/28/2018. The encounter with the patient was in whole, or in part for the following medical condition(s) which is the primary reason for home health care (List medical condition): Recurrent/persistent lower extremity muscle spasm/cramps interfering with ambulation and mobility.   02/28/18 5409  cyclobenzaprine (FLEXERIL) 10 MG tablet  Daily at bedtime     02/28/18 9030       Follow Up: Unk Pinto, MD [address] [place] [address] [phone]   in 3-5 days, If symptoms do not improve or  worsen      This chart  was dictated using voice recognition software.  Despite best efforts to proofread,  errors can occur which can change the documentation meaning.   Fatima Blank, MD 02/28/18 [phone]

## 2018-02-28 NOTE — Discharge Instructions (Addendum)
You may use over-the-counter Motrin (Ibuprofen), Acetaminophen (Tylenol), topical muscle creams such as SalonPas, Icy Hot, Bengay, etc. Please stretch, apply heat, and have massage therapy for additional assistance. ° °

## 2018-02-28 NOTE — ED Notes (Signed)
Bed: PT00 Expected date:  Expected time:  Means of arrival:  Comments: EMS thigh cramping

## 2018-02-28 NOTE — ED Notes (Signed)
Warm compresses placed on patient leg. Wife states that husband is a police and wears a gun belt. She also says that his leg has been swelling and is swollen now.

## 2018-03-01 ENCOUNTER — Telehealth: Payer: Self-pay | Admitting: *Deleted

## 2018-03-01 NOTE — Telephone Encounter (Signed)
Tywaun Hiltner J. Mychael Smock, RN, BSN, NCM [phone] Spoke with pt at bedside regarding discharge planning for Home Health Services. Offered pt list of home health agencies to choose from.  Pt chose [facility] to render services. Jermaine Jenkins of [facility] notified. Patient made aware that [facility] will be in contact in 24-48 hours.  No DME needs identified at this time.  

## 2018-03-02 ENCOUNTER — Encounter: Payer: Self-pay | Admitting: Adult Health

## 2018-03-06 DIAGNOSIS — M7062 Trochanteric bursitis, left hip: Secondary | ICD-10-CM | POA: Diagnosis not present

## 2018-03-11 ENCOUNTER — Other Ambulatory Visit: Payer: Self-pay | Admitting: Orthopedic Surgery

## 2018-03-11 ENCOUNTER — Ambulatory Visit
Admission: RE | Admit: 2018-03-11 | Discharge: 2018-03-11 | Disposition: A | Discharge status: Home / Self Care | Payer: 59 | Source: Ambulatory Visit | Attending: Orthopedic Surgery | Admitting: Orthopedic Surgery

## 2018-03-11 DIAGNOSIS — M7062 Trochanteric bursitis, left hip: Secondary | ICD-10-CM

## 2018-03-11 DIAGNOSIS — M25552 Pain in left hip: Secondary | ICD-10-CM | POA: Diagnosis not present

## 2018-03-17 DIAGNOSIS — M25552 Pain in left hip: Secondary | ICD-10-CM | POA: Diagnosis not present

## 2018-04-05 ENCOUNTER — Other Ambulatory Visit: Payer: Self-pay | Admitting: Internal Medicine

## 2018-04-05 DIAGNOSIS — R450 Nervousness: Secondary | ICD-10-CM

## 2018-04-05 DIAGNOSIS — M25552 Pain in left hip: Secondary | ICD-10-CM

## 2018-04-05 DIAGNOSIS — S76012D Strain of muscle, fascia and tendon of left hip, subsequent encounter: Secondary | ICD-10-CM | POA: Diagnosis not present

## 2018-04-05 MED ORDER — CYCLOBENZAPRINE HCL 10 MG PO TABS: ORAL_TABLET | ORAL | 0 refills | Status: DC

## 2018-04-05 MED ORDER — ALPRAZOLAM 1 MG PO TABS: ORAL_TABLET | ORAL | 0 refills | Status: DC

## 2018-04-07 ENCOUNTER — Encounter: Payer: Self-pay | Admitting: Gastroenterology

## 2018-04-09 ENCOUNTER — Encounter: Payer: Self-pay | Admitting: Gastroenterology

## 2018-04-12 DIAGNOSIS — S76012D Strain of muscle, fascia and tendon of left hip, subsequent encounter: Secondary | ICD-10-CM | POA: Diagnosis not present

## 2018-04-14 DIAGNOSIS — S76012D Strain of muscle, fascia and tendon of left hip, subsequent encounter: Secondary | ICD-10-CM | POA: Diagnosis not present

## 2018-04-16 ENCOUNTER — Telehealth: Payer: Self-pay

## 2018-04-16 NOTE — Telephone Encounter (Signed)
Pt called back and verbalize he forgot about appointment this morning. Reschedule to 8-21 at 3:30 in previst room 51.

## 2018-04-16 NOTE — Telephone Encounter (Signed)
Pt did not show up this morning for previsit. Left a voicemail to call back to reschedule previsit today by 5pm or we will have to reschedule appointments.

## 2018-04-19 DIAGNOSIS — S76012D Strain of muscle, fascia and tendon of left hip, subsequent encounter: Secondary | ICD-10-CM | POA: Diagnosis not present

## 2018-04-21 ENCOUNTER — Ambulatory Visit (AMBULATORY_SURGERY_CENTER): Payer: Self-pay | Admitting: *Deleted

## 2018-04-21 ENCOUNTER — Encounter: Payer: Self-pay | Admitting: Gastroenterology

## 2018-04-21 VITALS — Ht 72.0 in | Wt 316.2 lb

## 2018-04-21 DIAGNOSIS — M25552 Pain in left hip: Secondary | ICD-10-CM | POA: Diagnosis not present

## 2018-04-21 DIAGNOSIS — K2270 Barrett's esophagus without dysplasia: Secondary | ICD-10-CM

## 2018-04-21 DIAGNOSIS — G479 Sleep disorder, unspecified: Secondary | ICD-10-CM | POA: Insufficient documentation

## 2018-04-21 NOTE — Progress Notes (Signed)
Patient denies any allergies to eggs or soy. Patient denies any problems with anesthesia/sedation. Patient denies any oxygen use at home. Patient denies taking any diet/weight loss medications or blood thinners. EMMI education offered, pt declined.  

## 2018-04-26 ENCOUNTER — Encounter: Payer: Self-pay | Admitting: Internal Medicine

## 2018-04-26 ENCOUNTER — Ambulatory Visit (INDEPENDENT_AMBULATORY_CARE_PROVIDER_SITE_OTHER): Payer: 59 | Admitting: Internal Medicine

## 2018-04-26 VITALS — BP 192/120 | HR 88 | Temp 97.5°F | Resp 18 | Ht 72.0 in | Wt 298.6 lb

## 2018-04-26 DIAGNOSIS — Z87891 Personal history of nicotine dependence: Secondary | ICD-10-CM | POA: Insufficient documentation

## 2018-04-26 DIAGNOSIS — N1831 Chronic kidney disease, stage 3a: Secondary | ICD-10-CM

## 2018-04-26 DIAGNOSIS — Z79899 Other long term (current) drug therapy: Secondary | ICD-10-CM

## 2018-04-26 DIAGNOSIS — I10 Essential (primary) hypertension: Secondary | ICD-10-CM

## 2018-04-26 DIAGNOSIS — Z8249 Family history of ischemic heart disease and other diseases of the circulatory system: Secondary | ICD-10-CM | POA: Insufficient documentation

## 2018-04-26 DIAGNOSIS — E559 Vitamin D deficiency, unspecified: Secondary | ICD-10-CM

## 2018-04-26 DIAGNOSIS — S76302S Unspecified injury of muscle, fascia and tendon of the posterior muscle group at thigh level, left thigh, sequela: Secondary | ICD-10-CM

## 2018-04-26 DIAGNOSIS — R7303 Prediabetes: Secondary | ICD-10-CM | POA: Diagnosis not present

## 2018-04-26 DIAGNOSIS — E349 Endocrine disorder, unspecified: Secondary | ICD-10-CM

## 2018-04-26 DIAGNOSIS — E782 Mixed hyperlipidemia: Secondary | ICD-10-CM | POA: Diagnosis not present

## 2018-04-26 DIAGNOSIS — N183 Chronic kidney disease, stage 3 (moderate): Secondary | ICD-10-CM

## 2018-04-26 DIAGNOSIS — M1000 Idiopathic gout, unspecified site: Secondary | ICD-10-CM

## 2018-04-26 MED ORDER — GABAPENTIN 100 MG PO CAPS: ORAL_CAPSULE | ORAL | 1 refills | Status: DC

## 2018-04-26 MED ORDER — ATENOLOL 100 MG PO TABS: ORAL_TABLET | ORAL | 1 refills | Status: DC

## 2018-04-26 NOTE — Progress Notes (Signed)
This very nice 62 y.o. [name] presents for 6 month follow up with HTN, HLD, Pre-Diabetes and Vitamin D Deficiency. Patient has hx/o Gout controlled on meds. His GERD is quiescent on Nexium which was increased by Dr Loletha Carrow after EGD confirmed (+) Barrett's.      Patient relates hx/o Lt buttock pain since April, 1st seen at Urgent Care , then Er and finally in May saw Dr Mayer Camel & had MRI finding a torn hamstring muscle for which he's receiving LPT and in on Ibupro 200 mg x 6-8 tabs /day w/o relief.      Patient is treated for HTN circa 2005 & BP has been controlled and today's BP was elevated at 192/120 and rechecked at 172/121. Patient attributed to "White Coat Syndrome.  Patient has had no complaints of any cardiac type chest pain, palpitations, dyspnea / orthopnea / PND, dizziness, claudication, or dependent edema.     Hyperlipidemia is controlled with diet & meds. Patient denies myalgias or other med SE's. Last Lipids were at goal: Lab Results  Component Value Date   CHOL 154 01/18/2018   HDL 44 01/18/2018   LDLCALC 89 01/18/2018   TRIG 118 01/18/2018   CHOLHDL 3.5 01/18/2018      Also, the patient has history of Morbid Obesity (BMI 40+) and PreDiabetes (A1c 5.9%/2010, 6.1%/2012 and 5.8%/2016) and has had no symptoms of reactive hypoglycemia, diabetic polys, paresthesias or visual blurring.  Last A1c was not at goal: Lab Results  Component Value Date   HGBA1C 5.8 (H) 01/18/2018      Patient has hx/o Low T ("228"/2001) and declined replacement therapy.      Further, the patient also has history of Vitamin D Deficiency ("17"/2008) and supplements vitamin D without any suspected side-effects. Last vitamin D was at goal: Lab Results  Component Value Date   VD25OH 71 10/12/2017   Current Outpatient Medications on File Prior to Visit  Medication Sig  . 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 allopurinol (ZYLOPRIM) 300 MG tablet TAKE 1 TABLET BY MOUTH   DAILY FOR GOUT PREVENTION  . ALPRAZolam (XANAX) 1 MG tablet Take 1/2-1 tablet 2-3 x / day ONLY if Needed for Anxiety Attack  & limit to 5 days /week to avoid addiction  . aspirin EC 81 MG tablet Take 81 mg by mouth daily.  Marland Kitchen buPROPion (WELLBUTRIN XL) 300 MG 24 hr tablet TAKE 1 TABLET BY MOUTH  EVERY MORNING  . Cyanocobalamin (VITAMIN B 12 PO) Take 1 tablet by mouth daily.    . cyclobenzaprine (FLEXERIL) 10 MG tablet Take at bedtime for muscle spasm - Do not take with Alprazolam / Xanax  . esomeprazole (NEXIUM) 40 MG capsule Take 1 capsule 2 x / day for Barrett's Esophagus  . fenofibrate 160 MG tablet TAKE 1 TABLET BY MOUTH  DAILY  . fish oil-omega-3 fatty acids 1000 MG capsule Take 1 g by mouth daily.   . Flaxseed, Linseed, (FLAX SEEDS PO) Take by mouth.  . furosemide (LASIX) 20 MG tablet TAKE 1 TABLET BY MOUTH  DAILY (Patient taking differently: TAKE 1 TABLET BY MOUTH  DAILY prn)  . magnesium oxide (MAG-OX) 400 MG tablet Take 400 mg by mouth 2 (two) times daily.   . meloxicam (MOBIC) 15 MG tablet TAKE 1 TABLET BY MOUTH DAILY WITH FOOD FOR 2 WEEKS, CAN TAKE WITH TYLENOL, CAN TAKE THE ALEVE, IBUPROFEN, THEN DAILY AS NEEDED FOR PAIN  .  methocarbamol (ROBAXIN) 500 MG tablet TAKE 1 TABLET BY MOUTH AT  BEDTIME  . montelukast (SINGULAIR) 10 MG tablet TAKE 1 TABLET BY MOUTH AT  BEDTIME  . Multiple Vitamin (MULTIVITAMIN WITH MINERALS) TABS tablet Take 1 tablet by mouth daily.  . potassium chloride (K-DUR,KLOR-CON) 10 MEQ tablet TAKE 1 TABLET BY MOUTH 3  TIMES DAILY  . TESTIM 50 MG/5GM (1%) GEL APPLY CONTENTS OF 1 TUBE  TOPICALLY DAILY  . valsartan-hydrochlorothiazide (DIOVAN-HCT) 160-25 MG tablet TAKE 1 TABLET BY MOUTH  DAILY FOR BLOOD PRESSURE  . vitamin C (ASCORBIC ACID) 500 MG tablet Take 500 mg by mouth daily.   . Vitamin D, Ergocalciferol, (DRISDOL) 50000 units CAPS capsule TAKE 1 CAPSULE BY MOUTH  DAILY   No current facility-administered medications on file prior to visit.    No Known  Allergies PMHx:   Past Medical History:  Diagnosis Date  . Allergy   . Anxiety   . Asthma   . Barrett's esophagus   . GERD (gastroesophageal reflux disease)   . Gout   . H/O hiatal hernia   . Hyperlipidemia   . Hypertension   . Prediabetes   . Sleep apnea   . Vitamin D deficiency    Immunization History  Administered Date(s) Administered  . Influenza Inj Mdck Quad With Preservative 07/02/2017  . Influenza Split 06/28/2014, 06/28/2015  . Influenza,inj,quad, With Preservative 08/11/2016  . Influenza-Unspecified 09/01/2012  . PPD Test 06/28/2014, 08/11/2016, 10/12/2017  . Pneumococcal-Unspecified 09/01/1998  . Tdap 08/01/2013   Past Surgical History:  Procedure Laterality Date  . CLOSED REDUCTION NASAL FRACTURE N/A 06/10/2016   Procedure: CLOSED REDUCTION NASAL FRACTURE FOREHEAD SUTURE REMOVAL;  Surgeon: [name], DO;  Location: [facility];  Service: Plastics;  Laterality: N/A;  . COLONOSCOPY    . [place]  . Left knee Arthroscopy    . Right knee arthroscopy    . UPPER GASTROINTESTINAL ENDOSCOPY     last egd 12-7*2012   FHx:    Reviewed / unchanged  SHx:    Reviewed / unchanged   Systems Review:  Constitutional: Denies fever, chills, wt changes, headaches, insomnia, fatigue, night sweats, change in appetite. Eyes: Denies redness, blurred vision, diplopia, discharge, itchy, watery eyes.  ENT: Denies discharge, congestion, post nasal drip, epistaxis, sore throat, earache, hearing loss, dental pain, tinnitus, vertigo, sinus pain, snoring.  CV: Denies chest pain, palpitations, irregular heartbeat, syncope, dyspnea, diaphoresis, orthopnea, PND, claudication or edema. Respiratory: denies cough, dyspnea, DOE, pleurisy, hoarseness, laryngitis, wheezing.  Gastrointestinal: Denies dysphagia, odynophagia, heartburn, reflux, water brash, abdominal pain or cramps, nausea, vomiting, bloating, diarrhea, constipation, hematemesis, melena,  hematochezia  or hemorrhoids. Genitourinary: Denies dysuria, frequency, urgency, nocturia, hesitancy, discharge, hematuria or flank pain. Musculoskeletal: Denies arthralgias, myalgias, stiffness, jt. swelling, pain, limping or strain/sprain.  Skin: Denies pruritus, rash, hives, warts, acne, eczema or change in skin lesion(s). Neuro: No weakness, tremor, incoordination, spasms, paresthesia or pain. Psychiatric: Denies confusion, memory loss or sensory loss. Endo: Denies change in weight, skin or hair change.  Heme/Lymph: No excessive bleeding, bruising or enlarged lymph nodes.  Physical Exam  BP (!) 192/120   Pulse 88   Temp (!) 97.5 F (36.4 C)   Resp 18   Ht 6' (1.829 m)   Wt 298 lb 9.6 oz (135.4 kg)   BMI 40.50 kg/m   Appears  well nourished, well groomed  and in no distress.  Eyes: PERRLA, EOMs, conjunctiva no swelling or erythema. Sinuses: No frontal/maxillary tenderness ENT/Mouth: EAC's  clear, TM's nl w/o erythema, bulging. Nares clear w/o erythema, swelling, exudates. Oropharynx clear without erythema or exudates. Oral hygiene is good. Tongue normal, non obstructing. Hearing intact.  Neck: Supple. Thyroid not palpable. Car 2+/2+ without bruits, nodes or JVD. Chest: Respirations nl with BS clear & equal w/o rales, rhonchi, wheezing or stridor.  Cor: Heart sounds normal w/ regular rate and rhythm without sig. murmurs, gallops, clicks or rubs. Peripheral pulses normal and equal  without edema.  Abdomen: Soft & bowel sounds normal. Non-tender w/o guarding, rebound, hernias, masses or organomegaly.  Lymphatics: Unremarkable.  Musculoskeletal: Full ROM all peripheral extremities, joint stability, 5/5 strength and normal gait.  Skin: Warm, dry without exposed rashes, lesions or ecchymosis apparent.  Neuro: Cranial nerves intact, reflexes equal bilaterally. Sensory-motor testing grossly intact. Tendon reflexes grossly intact.  Pysch: Alert & oriented x 3.  Insight and judgement nl &  appropriate. No ideations.  Assessment and Plan:  - Continue medication, monitor blood pressure at home.  - Continue DASH diet.  Reminder to go to the ER if any CP,  SOB, nausea, dizziness, severe HA, changes vision/speech.  - Continue diet/meds, exercise,& lifestyle modifications.  - Continue monitor periodic cholesterol/liver & renal functions   1. Accelerated hypertension  - CBC with Differential/Platelet - COMPLETE METABOLIC PANEL WITH GFR - Magnesium - TSH ADD - atenolol (TENORMIN) 100 MG tablet; Take 1 tablet daily for BP  Dispense: 90 tablet; Refill: 1 & ROV 1 week to re-assess  2. Hyperlipidemia, mixed  - Lipid panel - TSH  3. Prediabetes  - Hemoglobin A1c - Insulin, random  4. Vitamin D deficiency  - Continue diet, exercise, lifestyle modifications.  - Monitor appropriate labs. - Continue supplementation.  - VITAMIN D 25 Hydroxyl  5. Idiopathic gout  - Uric acid  6. Testosterone deficiency  - Testosterone  7. Stage 3 CKD (GFR 56 ml/min)  - COMPLETE METABOLIC PANEL WITH GFR  8. Hamstring injury, left, sequela  - gabapentin (NEURONTIN) 100 MG capsule; Take 1 to 3 capsules 3 x /day as needed for pain  Dispense: 90 capsule; Refill: 1  9. Medication management  - CBC with Differential/Platelet - COMPLETE METABOLIC PANEL WITH GFR - Magnesium - Lipid panel - TSH - Hemoglobin A1c - Insulin, random - VITAMIN D 25 Hydroxyl - Testosterone - Uric acid      Discussed  regular exercise, BP monitoring, weight control to achieve/maintain BMI less than 25 and discussed med and SE's. Recommended labs to assess and monitor clinical status with further disposition pending results of labs. Over 30 minutes of exam, counseling, chart review was performed.

## 2018-04-26 NOTE — Patient Instructions (Signed)

## 2018-04-27 ENCOUNTER — Ambulatory Visit (AMBULATORY_SURGERY_CENTER): Payer: 59 | Admitting: Gastroenterology

## 2018-04-27 ENCOUNTER — Encounter: Payer: Self-pay | Admitting: Gastroenterology

## 2018-04-27 VITALS — BP 141/93 | HR 63 | Temp 96.8°F | Resp 14 | Ht 72.0 in | Wt 316.0 lb

## 2018-04-27 DIAGNOSIS — M25552 Pain in left hip: Secondary | ICD-10-CM | POA: Diagnosis not present

## 2018-04-27 DIAGNOSIS — K2270 Barrett's esophagus without dysplasia: Secondary | ICD-10-CM

## 2018-04-27 LAB — CBC WITH DIFFERENTIAL/PLATELET
BASOS PCT: 0.4 %
Basophils Absolute: 28 cells/uL (ref 0–200)
EOS ABS: 269 {cells}/uL (ref 15–500)
Eosinophils Relative: 3.9 %
HCT: 42.7 % (ref 38.5–50.0)
HEMOGLOBIN: 14.8 g/dL (ref 13.2–17.1)
Lymphs Abs: 2125 cells/uL (ref 850–3900)
MCH: 31 pg (ref 27.0–33.0)
MCHC: 34.7 g/dL (ref 32.0–36.0)
MCV: 89.3 fL (ref 80.0–100.0)
MONOS PCT: 10.5 %
MPV: 10.3 fL (ref 7.5–12.5)
NEUTROS ABS: 3754 {cells}/uL (ref 1500–7800)
Neutrophils Relative %: 54.4 %
Platelets: 159 10*3/uL (ref 140–400)
RBC: 4.78 10*6/uL (ref 4.20–5.80)
RDW: 13.3 % (ref 11.0–15.0)
TOTAL LYMPHOCYTE: 30.8 %
WBC: 6.9 10*3/uL (ref 3.8–10.8)
WBCMIX: 725 {cells}/uL (ref 200–950)

## 2018-04-27 LAB — COMPLETE METABOLIC PANEL WITH GFR
AG Ratio: 1.5 (calc) (ref 1.0–2.5)
ALBUMIN MSPROF: 4.3 g/dL (ref 3.6–5.1)
ALKALINE PHOSPHATASE (APISO): 68 U/L (ref 40–115)
ALT: 18 U/L (ref 9–46)
AST: 17 U/L (ref 10–35)
BUN: 14 mg/dL (ref 7–25)
CALCIUM: 9.6 mg/dL (ref 8.6–10.3)
CO2: 28 mmol/L (ref 20–32)
CREATININE: 1.05 mg/dL (ref 0.70–1.25)
Chloride: 105 mmol/L (ref 98–110)
GFR, EST NON AFRICAN AMERICAN: 77 mL/min/{1.73_m2} (ref >=60)
GFR, Est African American: 89 mL/min/{1.73_m2} (ref >=60)
GLOBULIN: 2.9 g/dL (ref 1.9–3.7)
GLUCOSE: 114 mg/dL — AB (ref 65–99)
Potassium: 4 mmol/L (ref 3.5–5.3)
Sodium: 140 mmol/L (ref 135–146)
TOTAL PROTEIN: 7.2 g/dL (ref 6.1–8.1)
Total Bilirubin: 0.6 mg/dL (ref 0.2–1.2)

## 2018-04-27 LAB — HEMOGLOBIN A1C
Hgb A1c MFr Bld: 5.6 % of total Hgb (ref <5.7)
Mean Plasma Glucose: 114 (calc)
eAG (mmol/L): 6.3 (calc)

## 2018-04-27 LAB — LIPID PANEL
CHOL/HDL RATIO: 4 (calc) (ref <5.0)
Cholesterol: 171 mg/dL (ref <200)
HDL: 43 mg/dL (ref >40)
LDL CHOLESTEROL (CALC): 109 mg/dL — AB
Non-HDL Cholesterol (Calc): 128 mg/dL (calc) (ref <130)
TRIGLYCERIDES: 99 mg/dL (ref <150)

## 2018-04-27 LAB — MAGNESIUM: MAGNESIUM: 1.9 mg/dL (ref 1.5–2.5)

## 2018-04-27 LAB — VITAMIN D 25 HYDROXY (VIT D DEFICIENCY, FRACTURES): VIT D 25 HYDROXY: 46 ng/mL (ref 30–100)

## 2018-04-27 LAB — INSULIN, RANDOM: INSULIN: 29.6 u[IU]/mL — AB (ref 2.0–19.6)

## 2018-04-27 LAB — TESTOSTERONE: Testosterone: 251 ng/dL (ref 250–827)

## 2018-04-27 LAB — URIC ACID: URIC ACID, SERUM: 6.9 mg/dL (ref 4.0–8.0)

## 2018-04-27 LAB — TSH: TSH: 1.34 mIU/L (ref 0.40–4.50)

## 2018-04-27 MED ORDER — SODIUM CHLORIDE 0.9 % IV SOLN: 500.0000 mL | Freq: Once | INTRAVENOUS | Status: DC

## 2018-04-27 NOTE — Progress Notes (Signed)
Pt's states no medical or surgical changes since previsit or office visit. 

## 2018-04-27 NOTE — Op Note (Addendum)
[facility] Patient Name: Christopher [name] Procedure Date: 04/27/2018 9:35 AM MRN: [id number] Endoscopist: Mallie Mussel L. Loletha Carrow , MD Age: 62 Referring MD:  Date of Birth: 1956-02-01 Gender: Male Account #: [id number] Procedure:                Upper GI endoscopy Indications:              Surveillance for malignancy due to personal history                            of Barrett's esophagus (indefinite for dysplasia                            10/2017 - on twice daily PPI since then) Medicines:                Monitored Anesthesia Care Procedure:                Pre-Anesthesia Assessment:                           - Prior to the procedure, a History and Physical                            was performed, and patient medications and                            allergies were reviewed. The patient's tolerance of                            previous anesthesia was also reviewed. The risks                            and benefits of the procedure and the sedation                            options and risks were discussed with the patient.                            All questions were answered, and informed consent                            was obtained. Anticoagulants: The patient has taken                            aspirin. It was decided not to withhold this                            medication prior to the procedure. ASA Grade                            Assessment: II - A patient with mild systemic                            disease. After reviewing the risks and benefits,  the patient was deemed in satisfactory condition to                            undergo the procedure.                           After obtaining informed consent, the endoscope was                            passed under direct vision. Throughout the                            procedure, the patient's blood pressure, pulse, and                            oxygen saturations were monitored  continuously. The                            Endoscope was introduced through the mouth, and                            advanced to the second part of duodenum. The upper                            GI endoscopy was accomplished without difficulty.                            The patient tolerated the procedure well. Scope In: Scope Out: Findings:                 The larynx was normal.                           There were esophageal mucosal changes secondary to                            established short-segment Barrett's disease present                            at the gastroesophageal junction. There were two                            short tongues and a few small islands. The maximum                            longitudinal extent of these mucosal changes was                            2-3 cm in length. There were no raised or otherwise                            suspicious areas seen on WL or NBI. Mucosa was  biopsied with a cold forceps for histology in a                            targeted manner in the lower third of the                            esophagus. One specimen bottle was sent to                            pathology.                           The exam of the esophagus was otherwise normal.                           Striped mildly erythematous mucosa was found in the                            prepyloric region of the stomach.                           The exam of the stomach was otherwise normal.                           The examined duodenum was normal. Complications:            No immediate complications. Estimated Blood Loss:     Estimated blood loss was minimal. Impression:               - Normal larynx.                           - Esophageal mucosal changes secondary to                            established short-segment Barrett's disease.                            Biopsied.                           - Erythematous mucosa in the  prepyloric region of                            the stomach.                           - Normal examined duodenum. Recommendation:           - Patient has a contact number available for                            emergencies. The signs and symptoms of potential                            delayed complications were discussed with the  patient. Return to normal activities tomorrow.                            Written discharge instructions were provided to the                            patient.                           - Resume previous diet.                           - Continue present medications.                           - Await pathology results.                           - Repeat upper endoscopy (date not yet determined)                            for surveillance based on pathology results. Perla Echavarria L. Loletha Carrow, MD 04/27/2018 9:57:08 AM This report has been signed electronically.

## 2018-04-27 NOTE — Addendum Note (Signed)
Addended by: Randall Hiss B on: 04/27/2018 12:10 PM   Modules accepted: Orders

## 2018-04-27 NOTE — Patient Instructions (Signed)
YOU HAD AN ENDOSCOPIC PROCEDURE TODAY AT THE [facility]:   Refer to the procedure report that was given to you for any specific questions about what was found during the examination.  If the procedure report does not answer your questions, please call your gastroenterologist to clarify.  If you requested that your care partner not be given the details of your procedure findings, then the procedure report has been included in a sealed envelope for you to review at your convenience later.  YOU SHOULD EXPECT: Some feelings of bloating in the abdomen. Passage of more gas than usual.  Walking can help get rid of the air that was put into your GI tract during the procedure and reduce the bloating. If you had a lower endoscopy (such as a colonoscopy or flexible sigmoidoscopy) you may notice spotting of blood in your stool or on the toilet paper. If you underwent a bowel prep for your procedure, you may not have a normal bowel movement for a few days.  Please Note:  You might notice some irritation and congestion in your nose or some drainage.  This is from the oxygen used during your procedure.  There is no need for concern and it should clear up in a day or so.  SYMPTOMS TO REPORT IMMEDIATELY:   Following upper endoscopy (EGD)  Vomiting of blood or coffee ground material  New chest pain or pain under the shoulder blades  Painful or persistently difficult swallowing  New shortness of breath  Fever of 100F or higher  Black, tarry-looking stools  For urgent or emergent issues, a gastroenterologist can be reached at any hour by calling [phone].   DIET:  We do recommend a small meal at first, but then you may proceed to your regular diet.  Drink plenty of fluids but you should avoid alcoholic beverages for 24 hours.  ACTIVITY:  You should plan to take it easy for the rest of today and you should NOT DRIVE or use heavy machinery until tomorrow (because of the sedation medicines used  during the test).    FOLLOW UP: Our staff will call the number listed on your records the next business day following your procedure to check on you and address any questions or concerns that you may have regarding the information given to you following your procedure. If we do not reach you, we will leave a message.  However, if you are feeling well and you are not experiencing any problems, there is no need to return our call.  We will assume that you have returned to your regular daily activities without incident.  If any biopsies were taken you will be contacted by phone or by letter within the next 1-3 weeks.  Please call us at [phone] if you have not heard about the biopsies in 3 weeks.    SIGNATURES/CONFIDENTIALITY: You and/or your care partner have signed paperwork which will be entered into your electronic medical record.  These signatures attest to the fact that that the information above on your After Visit Summary has been reviewed and is understood.  Full responsibility of the confidentiality of this discharge information lies with you and/or your care-partner. 

## 2018-04-27 NOTE — Progress Notes (Signed)
To PACU, VSS. Report to [email or web address] 

## 2018-04-27 NOTE — Progress Notes (Signed)
Called to room to assist during endoscopic procedure.  Patient ID and intended procedure confirmed with present staff. Received instructions for my participation in the procedure from the performing physician.  

## 2018-04-28 ENCOUNTER — Telehealth: Payer: Self-pay

## 2018-04-28 NOTE — Telephone Encounter (Signed)
  Follow up Call-  Call back number 04/27/2018 10/06/2017  Post procedure Call Back phone  # [phone]  Permission to leave phone message Yes Yes  Some recent data might be hidden     Patient questions:  Do you have a fever, pain , or abdominal swelling? No. Pain Score  0 *  Have you tolerated food without any problems? Yes.    Have you been able to return to your normal activities? Yes.    Do you have any questions about your discharge instructions: Diet   No. Medications  No. Follow up visit  No.  Do you have questions or concerns about your Care? No.  Actions: * If pain score is 4 or above: No action needed, pain <4.

## 2018-04-29 ENCOUNTER — Encounter: Payer: Self-pay | Admitting: Gastroenterology

## 2018-05-04 ENCOUNTER — Ambulatory Visit (INDEPENDENT_AMBULATORY_CARE_PROVIDER_SITE_OTHER): Payer: 59 | Admitting: Internal Medicine

## 2018-05-04 ENCOUNTER — Encounter: Payer: Self-pay | Admitting: Internal Medicine

## 2018-05-04 VITALS — BP 180/106 | HR 68 | Temp 97.5°F | Resp 18 | Ht 72.0 in | Wt 323.2 lb

## 2018-05-04 DIAGNOSIS — I10 Essential (primary) hypertension: Secondary | ICD-10-CM | POA: Diagnosis not present

## 2018-05-04 MED ORDER — MINOXIDIL 10 MG PO TABS: ORAL_TABLET | ORAL | 3 refills | Status: DC

## 2018-05-04 NOTE — Progress Notes (Signed)
Subjective:    Patient ID: Christopher [name], male    DOB: 06-12-1956, 62 y.o.   MRN: [id number]  HPI   This very nice 62 yo [name] returns for 1 week f/u after elevated BP 192/120 & 172/121 and adding Atenolol with his Lasix & Diovan /HCT. He reports BP's still elevated ranging betw 120-165/90-100. He denies any HT sx's.   Medication Sig  . acyclovir 800 MG tablet TAKE 1 TABLET  DAILY FOR HSV  . Allopurinol 300 MG tablet TAKE 1 TABLET DAILY   . ALPRAZolam ( 1 MG tablet Take 1/2-1 tablet 2-3 x / day ONLY if Needed  . aspirin EC 81 MG tablet Take daily.  Marland Kitchen atenolol 100 MG tablet Take 1 tablet daily for BP  . buPROPion-XL 300 MG  TAKE 1 TABLET   EVERY MORNING  . VITAMIN B 12 tab Take 1 tablet  daily.    . cyclobenzaprine  10 MG tablet Take at bedtime for muscle spasm - Do not take with Alprazolam / Xanax  . Esomeprazole n40 MG  Take 1 capsule 2 x / day for Barrett's Esophagus  . fenofibrate 160 MG tablet TAKE 1 TABLET BY MOUTH  DAILY  . fish oil-omega-3  1000 MG Take 1 g by mouth daily.   Marland Kitchen FLAX SEEDS Take daily  . furosemide 20 MG tablet TAKE 1 TABLET BY MOUTH  DAILY  . magnesium oxide 400 MG tablet Take 2 (two) times daily.   . meloxicam  15 MG tablet TAKE 1 TABLET  DAILY   . methocarbamol500 MG tablet TAKE 1 TABLET  AT  BEDTIME  . montelukast 10 MG tablet TAKE 1 TABLET  AT  BEDTIME  . Multiple Vitamin w/Min  Take 1 tablet  daily.  Marland Kitchen K-DUR 10 MEQ t TAKE 1 TABLET  3  TIMES DAILY  . TESTIM 50 MG/5GM (1%) GEL APPLY 1 TUBE   DAILY  . valsartan-hctz 160-25 MG  TAKE 1 TABLET  DAILY FOR BLOOD PRESSURE  . vitamin C  500 MG tablet Take daily.   . Vitamin D 50000 units  TAKE 1 CAPSULE BY MOUTH  DAILY   No Known Allergies   Past Medical History:  Diagnosis Date  . Allergy   . Anxiety   . Asthma   . Barrett's esophagus   . GERD (gastroesophageal reflux disease)   . Gout   . H/O hiatal hernia   . Hyperlipidemia   . Hypertension   . Prediabetes   . Sleep apnea   . Vitamin D deficiency     Past Surgical History:  Procedure Laterality Date  . CLOSED REDUCTION NASAL FRACTURE N/A 06/10/2016   Procedure: CLOSED REDUCTION NASAL FRACTURE FOREHEAD SUTURE REMOVAL;  Surgeon: [name], DO;  Location: [facility];  Service: Plastics;  Laterality: N/A;  . COLONOSCOPY    . [place]  . Left knee Arthroscopy    . Right knee arthroscopy    . UPPER GASTROINTESTINAL ENDOSCOPY     last egd 12-7*2012   Review of Systems   10 point systems review negative except as above.    Objective:   Physical Exam  BP (!) 180/106   Pulse 68   Temp (!) 97.5 F (36.4 C)   Resp 18   Ht 6' (1.829 m)   Wt (!) 323 lb 3.2 oz (146.6 kg)   BMI 43.83 kg/m   Repeat    BP  153/104     P 65  HEENT - WNL. Neck - supple.  Chest - Clear equal BS. Cor - Nl HS. RRR w/o sig MGR. PP 1(+). No edema. MS- FROM w/o deformities.  Gait Nl. Neuro -  Nl w/o focal abnormalities    Assessment & Plan:   1. Accelerated hypertension  - minoxidil  10 MG tablet; Take 1/2 to 1 tablet at Bedtime  Disp: 90 tablet; Rf: 3  - discussed meds & SE's   - ROV 2-3 weeks

## 2018-05-17 DIAGNOSIS — S76012D Strain of muscle, fascia and tendon of left hip, subsequent encounter: Secondary | ICD-10-CM | POA: Diagnosis not present

## 2018-05-19 DIAGNOSIS — S76012D Strain of muscle, fascia and tendon of left hip, subsequent encounter: Secondary | ICD-10-CM | POA: Diagnosis not present

## 2018-05-24 ENCOUNTER — Ambulatory Visit (INDEPENDENT_AMBULATORY_CARE_PROVIDER_SITE_OTHER): Payer: 59 | Admitting: Internal Medicine

## 2018-05-24 ENCOUNTER — Encounter: Payer: Self-pay | Admitting: Internal Medicine

## 2018-05-24 VITALS — BP 168/106 | HR 68 | Temp 97.9°F | Resp 18 | Ht 72.0 in | Wt 323.4 lb

## 2018-05-24 DIAGNOSIS — I10 Essential (primary) hypertension: Secondary | ICD-10-CM | POA: Diagnosis not present

## 2018-05-24 DIAGNOSIS — Z79899 Other long term (current) drug therapy: Secondary | ICD-10-CM | POA: Diagnosis not present

## 2018-05-24 NOTE — Progress Notes (Addendum)
Subjective:    Patient ID: AMAHRI DENGEL, male    DOB: Aug 04, 1956, 62 y.o.   MRN: [id number]  HPI    Patient is a very nice 62 yo [name] w/HTN who was seen recently with accelerated BP's in the range 180/106  and had Minoxidil 10 mg added with his Atenolol 100 mg, Lasix 20 mg and Valsartan/Hctx 160/25. He reports BP's altho lower are still running high in the range of 150-160/100. He also experienced worsening dependent ankle edema since starting Minoxidil and doubled his dose of Lasix.  Medication Sig  . 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 allopurinol (ZYLOPRIM) 300 MG tablet TAKE 1 TABLET BY MOUTH  DAILY FOR GOUT PREVENTION  . ALPRAZolam (XANAX) 1 MG tablet Take 1/2-1 tablet 2-3 x / day ONLY if Needed for Anxiety Attack  & limit to 5 days /week to avoid addiction  . aspirin EC 81 MG tablet Take 81 mg by mouth daily.  Marland Kitchen atenolol (TENORMIN) 100 MG tablet Take 1 tablet daily for BP  . buPROPion (WELLBUTRIN XL) 300 MG 24 hr tablet TAKE 1 TABLET BY MOUTH  EVERY MORNING  . Cyanocobalamin (VITAMIN B 12 PO) Take 1 tablet by mouth daily.    . cyclobenzaprine (FLEXERIL) 10 MG tablet Take at bedtime for muscle spasm - Do not take with Alprazolam / Xanax  . esomeprazole (NEXIUM) 40 MG capsule Take 1 capsule 2 x / day for Barrett's Esophagus  . fenofibrate 160 MG tablet TAKE 1 TABLET BY MOUTH  DAILY  . fish oil-omega-3 fatty acids 1000 MG capsule Take 1 g by mouth daily.   . Flaxseed, Linseed, (FLAX SEEDS PO) Take by mouth.  . furosemide (LASIX) 20 MG tablet TAKE 1 TABLET BY MOUTH  DAILY  . magnesium oxide (MAG-OX) 400 MG tablet Take 400 mg by mouth 2 (two) times daily.   . meloxicam (MOBIC) 15 MG tablet TAKE 1 TABLET BY MOUTH DAILY WITH FOOD FOR 2 WEEKS, CAN TAKE WITH TYLENOL, CAN TAKE THE ALEVE, IBUPROFEN, THEN DAILY AS NEEDED FOR PAIN  . methocarbamol (ROBAXIN) 500 MG tablet TAKE 1 TABLET BY MOUTH AT  BEDTIME  . minoxidil (LONITEN) 10 MG tablet Take 1/2 to 1  tablet at Bedtime as directed for BP  . montelukast (SINGULAIR) 10 MG tablet TAKE 1 TABLET BY MOUTH AT  BEDTIME  . Multiple Vitamin (MULTIVITAMIN WITH MINERALS) TABS tablet Take 1 tablet by mouth daily.  . potassium chloride (K-DUR,KLOR-CON) 10 MEQ tablet TAKE 1 TABLET BY MOUTH 3  TIMES DAILY  . TESTIM 50 MG/5GM (1%) GEL APPLY CONTENTS OF 1 TUBE  TOPICALLY DAILY  . valsartan-hydrochlorothiazide (DIOVAN-HCT) 160-25 MG tablet TAKE 1 TABLET BY MOUTH  DAILY FOR BLOOD PRESSURE  . vitamin C (ASCORBIC ACID) 500 MG tablet Take 500 mg by mouth daily.   . Vitamin D, Ergocalciferol, (DRISDOL) 50000 units CAPS capsule TAKE 1 CAPSULE BY MOUTH  DAILY   No facility-administered medications prior to visit.    No Known Allergies  Past Medical History:  Diagnosis Date  . Allergy   . Anxiety   . Asthma   . Barrett's esophagus   . GERD (gastroesophageal reflux disease)   . Gout   . H/O hiatal hernia   . Hyperlipidemia   . Hypertension   . Prediabetes   . Sleep apnea   . Vitamin D deficiency    Past Surgical History:  Procedure Laterality Date  . CLOSED REDUCTION NASAL  FRACTURE N/A 06/10/2016   Procedure: CLOSED REDUCTION NASAL FRACTURE FOREHEAD SUTURE REMOVAL;  Surgeon: [name], DO;  Location: [facility];  Service: Plastics;  Laterality: N/A;  . COLONOSCOPY    . [place]  . Left knee Arthroscopy    . Right knee arthroscopy    . UPPER GASTROINTESTINAL ENDOSCOPY     last egd 12-7*2012   Review of Systems    10 point systems review negative except as above.    Objective:   Physical Exam  BP (!) 168/106   Pulse 68   Temp 97.9 F (36.6 C)   Resp 18   Ht 6' (1.829 m)   Wt (!) 323 lb 6.4 oz (146.7 kg)   BMI 43.86 kg/m   HEENT - WNL. Neck - supple.  Chest - Clear equal BS. Cor - Nl HS. RRR w/o sig MGR. PP 1(+). No edema. MS- FROM w/o deformities.  Gait Nl. Neuro -  Nl w/o focal abnormalities.    Assessment & Plan:   1. Accelerated  hypertension  - COMPLETE METABOLIC PANEL WITH GFR - Magnesium - US Renal Artery Stenosis; Future  - furosemide (LASIX) 40 MG tablet; Take 1 tablet daily for BP and fluid retention  Dispense: 90 tablet; Refill: 3  2. Medication management  - COMPLETE METABOLIC PANEL WITH GFR - Magnesium       .

## 2018-05-24 NOTE — Patient Instructions (Signed)

## 2018-05-25 LAB — COMPLETE METABOLIC PANEL WITH GFR
AG RATIO: 1.7 (calc) (ref 1.0–2.5)
ALBUMIN MSPROF: 4.4 g/dL (ref 3.6–5.1)
ALT: 17 U/L (ref 9–46)
AST: 18 U/L (ref 10–35)
Alkaline phosphatase (APISO): 42 U/L (ref 40–115)
BUN/Creatinine Ratio: 14 (calc) (ref 6–22)
BUN: 18 mg/dL (ref 7–25)
CALCIUM: 9.6 mg/dL (ref 8.6–10.3)
CO2: 31 mmol/L (ref 20–32)
Chloride: 100 mmol/L (ref 98–110)
Creat: 1.27 mg/dL — ABNORMAL HIGH (ref 0.70–1.25)
GFR, EST AFRICAN AMERICAN: 71 mL/min/{1.73_m2} (ref >=60)
GFR, EST NON AFRICAN AMERICAN: 61 mL/min/{1.73_m2} (ref >=60)
Globulin: 2.6 g/dL (calc) (ref 1.9–3.7)
Glucose, Bld: 94 mg/dL (ref 65–99)
Potassium: 3.9 mmol/L (ref 3.5–5.3)
Sodium: 138 mmol/L (ref 135–146)
TOTAL PROTEIN: 7 g/dL (ref 6.1–8.1)
Total Bilirubin: 0.7 mg/dL (ref 0.2–1.2)

## 2018-05-25 LAB — MAGNESIUM: Magnesium: 1.8 mg/dL (ref 1.5–2.5)

## 2018-05-25 MED ORDER — FUROSEMIDE 40 MG PO TABS: ORAL_TABLET | ORAL | 3 refills | Status: DC

## 2018-05-26 DIAGNOSIS — S76012D Strain of muscle, fascia and tendon of left hip, subsequent encounter: Secondary | ICD-10-CM | POA: Diagnosis not present

## 2018-05-26 DIAGNOSIS — M7062 Trochanteric bursitis, left hip: Secondary | ICD-10-CM | POA: Diagnosis not present

## 2018-05-28 DIAGNOSIS — M25552 Pain in left hip: Secondary | ICD-10-CM | POA: Diagnosis not present

## 2018-05-29 ENCOUNTER — Other Ambulatory Visit: Payer: Self-pay | Admitting: Internal Medicine

## 2018-06-01 DIAGNOSIS — S76012D Strain of muscle, fascia and tendon of left hip, subsequent encounter: Secondary | ICD-10-CM | POA: Diagnosis not present

## 2018-06-07 DIAGNOSIS — S76012D Strain of muscle, fascia and tendon of left hip, subsequent encounter: Secondary | ICD-10-CM | POA: Diagnosis not present

## 2018-06-09 ENCOUNTER — Ambulatory Visit
Admission: RE | Admit: 2018-06-09 | Discharge: 2018-06-09 | Disposition: A | Discharge status: Home / Self Care | Payer: 59 | Source: Ambulatory Visit | Attending: Internal Medicine | Admitting: Internal Medicine

## 2018-06-09 DIAGNOSIS — I10 Essential (primary) hypertension: Secondary | ICD-10-CM

## 2018-06-09 DIAGNOSIS — N281 Cyst of kidney, acquired: Secondary | ICD-10-CM | POA: Diagnosis not present

## 2018-06-14 NOTE — Patient Instructions (Signed)

## 2018-06-14 NOTE — Progress Notes (Signed)
Subjective:    Patient ID: Christopher [name], male    DOB: 12/23/1955, 62 y.o.   MRN: [id number]  HPI     This nice 29 [name] w /accelerated HTN at 192/120 on 26 August on Diovan 160/25 & he had Atenolol 100 mg added.  BP's remained elevated and on 3 Sept his BP checked at 180/106 and re-checked at 153/104 and  Minoxidil 10 mg was added. By 23 Sept, his BP's were better, altho reported still high in the range 150-160/100. Because of ankle edema, he had doubled his Lasix dose and stopped his Minoxidil .  Renal Artery U/s was done since then & was negative.      Patient returns today for ongoing evaluation reporting home BP's are ranging 130-135/84/93. Denies any sx's of HA's, dizziness, CP, palpitations, dyspnea , but does have ongoing edema.       Also, today he's c/o bilat LBP radiating into his buttocks.   Medication Sig  . acyclovir  800 MG TAKE 1 TABLET BY MOUTH  DAILY FOR HSV  . allopurinol  300 MG  TAKE 1 TABLET BY MOUTH  DAILY FOR GOUT PREVENTION  . ALPRAZolam 1 MG  Take 1/2-1 tablet 2-3 x / day ONLY if Needed for Anxiety Attack   . aspirin EC 81 MG  Take 81 mg by mouth daily.  Marland Kitchen atenolol  100 MG  Take 1 tablet daily for BP  . buPROPion-XL) 300 MG TAKE 1 TABLET BY MOUTH  EVERY MORNING  . VITAMIN B 12 tab Take 1 tablet by mouth daily.    . Cyclobenzaprine 10 MG  Take at bedtime for muscle spasm - Do not take with Alprazolam / Xanax  . Esomeprazole 40 MG  Take 1 capsule 2 x / day for Barrett's Esophagus  . fenofibrate 160 MG  TAKE 1 TABLET BY MOUTH  DAILY  . fish oil-omega-3 1000 MG  Take 1 g by mouth daily.   . Flaxseed Take by mouth.  . furosemide  40 MG Take 1 tablet daily for BP and fluid retention  . magnesium  400 MG Take 400 mg by mouth 2 (two) times daily.   . meloxicam  15 MG  TAKE 1 TABLET  DAILY AS NEEDED FOR PAIN  . Methocarbamol 500 MG  TAKE 1 TABLET BY MOUTH AT  BEDTIME  . montelukast 10 MG  TAKE 1 TABLET BY MOUTH AT  BEDTIME  . Multi-Vit w/Minerals Take 1 tablet by mouth  daily.  . potassium cl 10 MEQ  TAKE 1 TABLET BY MOUTH 3  TIMES DAILY  . TESTIM 50 MG (1%) GEL APPLY CONTENTS OF 1 TUBE  TOPICALLY DAILY  . valsartan-hctz 160-25 MG TAKE 1 TABLET BY MOUTH  DAILY FOR BLOOD PRESSURE  . vitamin C 500 MG tablet Take 500 mg by mouth daily.   . Vitamin D 50,000 units  TAKE 1 CAPSULE BY MOUTH  DAILY  . minoxidil 10 MG tablet Patient not taking: Reported on 06/15/2018)   No Known Allergies   Past Medical History:  Diagnosis Date  . Allergy   . Anxiety   . Asthma   . Barrett's esophagus   . GERD (gastroesophageal reflux disease)   . Gout   . H/O hiatal hernia   . Hyperlipidemia   . Hypertension   . Prediabetes   . Sleep apnea   . Vitamin D deficiency       10 point systems review negative except as above.    Objective:  Physical Exam  BP (!) 158/108   Pulse 68   Temp (!) 97.4 F (36.3 C)   Resp 18   Ht 6' (1.829 m)   Wt (!) 322 lb 3.2 oz (146.1 kg)   BMI 43.70 kg/m   HEENT - WNL. Neck - supple.  Chest - Clear equal BS. Cor - Nl HS. RRR w/o sig MGR. PP 1(+). No edema. MS- FROM w/o deformities.  Gait Nl. Neuro -  Nl w/o focal abnormalities.    Assessment & Plan:   1. Accelerated hypertension  - COMPLETE METABOLIC PANEL WITH GFR  - D/C Diovan 160 / HCT  - Change Atenolol 100 mg to QHS - valsartan (DIOVAN) 320 MG tablet; Take 1 tablet every morning for BP  Dispense: 90 tablet; Refill: 3  - Increasefurosemide (LASIX) 40 MG tablet; Take 1 tablet (40 mg total) by mouth 2 (two) times daily.  Dispense: 180 tablet; Refill: 3  2. Medication management  - COMPLETE METABOLIC PANEL WITH GFR - Magnesium  3. Chronic bilateral low back pain with bilateral sciatica  - predniSONE (DELTASONE) 20 MG tablet; 1 tab 3 x day for 3 days, then 1 tab 2 x day for 3 days, then 1 tab 1 x day for 5 days  Dispense: 20 tablet; Refill: 0  4. Need for prophylactic vaccination and inoculation against influenza  - FLU VACCINE MDCK QUAD  W/Preservative      .

## 2018-06-15 ENCOUNTER — Ambulatory Visit: Payer: 59 | Admitting: Internal Medicine

## 2018-06-15 ENCOUNTER — Other Ambulatory Visit: Payer: Self-pay | Admitting: *Deleted

## 2018-06-15 ENCOUNTER — Ambulatory Visit: Payer: Self-pay | Admitting: Internal Medicine

## 2018-06-15 VITALS — BP 158/108 | HR 68 | Temp 97.4°F | Resp 18 | Ht 72.0 in | Wt 322.2 lb

## 2018-06-15 DIAGNOSIS — Z79899 Other long term (current) drug therapy: Secondary | ICD-10-CM | POA: Diagnosis not present

## 2018-06-15 DIAGNOSIS — M5442 Lumbago with sciatica, left side: Secondary | ICD-10-CM | POA: Diagnosis not present

## 2018-06-15 DIAGNOSIS — Z23 Encounter for immunization: Secondary | ICD-10-CM

## 2018-06-15 DIAGNOSIS — R450 Nervousness: Secondary | ICD-10-CM

## 2018-06-15 DIAGNOSIS — I10 Essential (primary) hypertension: Secondary | ICD-10-CM

## 2018-06-15 DIAGNOSIS — G8929 Other chronic pain: Secondary | ICD-10-CM

## 2018-06-15 DIAGNOSIS — M5441 Lumbago with sciatica, right side: Secondary | ICD-10-CM

## 2018-06-15 MED ORDER — PREDNISONE 20 MG PO TABS: ORAL_TABLET | ORAL | 0 refills | Status: DC

## 2018-06-15 MED ORDER — VALSARTAN 320 MG PO TABS: ORAL_TABLET | ORAL | 3 refills | Status: DC

## 2018-06-15 MED ORDER — ATENOLOL 100 MG PO TABS: ORAL_TABLET | ORAL | 1 refills | Status: DC

## 2018-06-15 MED ORDER — FUROSEMIDE 40 MG PO TABS: 40.0000 mg | ORAL_TABLET | Freq: Two times a day (BID) | ORAL | 3 refills | Status: DC

## 2018-06-16 LAB — COMPLETE METABOLIC PANEL WITH GFR
AG Ratio: 2 (calc) (ref 1.0–2.5)
ALT: 18 U/L (ref 9–46)
AST: 16 U/L (ref 10–35)
Albumin: 4.5 g/dL (ref 3.6–5.1)
Alkaline phosphatase (APISO): 57 U/L (ref 40–115)
BUN: 15 mg/dL (ref 7–25)
CALCIUM: 9.4 mg/dL (ref 8.6–10.3)
CO2: 30 mmol/L (ref 20–32)
CREATININE: 1.16 mg/dL (ref 0.70–1.25)
Chloride: 102 mmol/L (ref 98–110)
GFR, Est African American: 79 mL/min/{1.73_m2} (ref >=60)
GFR, Est Non African American: 68 mL/min/{1.73_m2} (ref >=60)
GLOBULIN: 2.3 g/dL (ref 1.9–3.7)
Glucose, Bld: 106 mg/dL — ABNORMAL HIGH (ref 65–99)
POTASSIUM: 4 mmol/L (ref 3.5–5.3)
SODIUM: 141 mmol/L (ref 135–146)
Total Bilirubin: 0.5 mg/dL (ref 0.2–1.2)
Total Protein: 6.8 g/dL (ref 6.1–8.1)

## 2018-06-16 LAB — MAGNESIUM: MAGNESIUM: 1.9 mg/dL (ref 1.5–2.5)

## 2018-06-19 ENCOUNTER — Encounter: Payer: Self-pay | Admitting: Internal Medicine

## 2018-06-26 DIAGNOSIS — S76012D Strain of muscle, fascia and tendon of left hip, subsequent encounter: Secondary | ICD-10-CM | POA: Diagnosis not present

## 2018-06-27 ENCOUNTER — Other Ambulatory Visit: Payer: Self-pay | Admitting: Internal Medicine

## 2018-06-27 DIAGNOSIS — M25552 Pain in left hip: Secondary | ICD-10-CM

## 2018-07-06 ENCOUNTER — Other Ambulatory Visit: Payer: Self-pay | Admitting: Internal Medicine

## 2018-07-26 DIAGNOSIS — H20011 Primary iridocyclitis, right eye: Secondary | ICD-10-CM | POA: Diagnosis not present

## 2018-07-27 ENCOUNTER — Ambulatory Visit: Payer: 59 | Admitting: Adult Health Nurse Practitioner

## 2018-07-27 ENCOUNTER — Encounter: Payer: Self-pay | Admitting: Adult Health Nurse Practitioner

## 2018-07-27 ENCOUNTER — Ambulatory Visit: Payer: Self-pay | Admitting: Physician Assistant

## 2018-07-27 VITALS — BP 132/80 | HR 65 | Temp 98.5°F | Wt 329.2 lb

## 2018-07-27 DIAGNOSIS — E782 Mixed hyperlipidemia: Secondary | ICD-10-CM

## 2018-07-27 DIAGNOSIS — N183 Chronic kidney disease, stage 3 unspecified: Secondary | ICD-10-CM

## 2018-07-27 DIAGNOSIS — I700 Atherosclerosis of aorta: Secondary | ICD-10-CM

## 2018-07-27 DIAGNOSIS — F418 Other specified anxiety disorders: Secondary | ICD-10-CM

## 2018-07-27 DIAGNOSIS — G4762 Sleep related leg cramps: Secondary | ICD-10-CM

## 2018-07-27 DIAGNOSIS — Z79899 Other long term (current) drug therapy: Secondary | ICD-10-CM | POA: Diagnosis not present

## 2018-07-27 DIAGNOSIS — E559 Vitamin D deficiency, unspecified: Secondary | ICD-10-CM

## 2018-07-27 DIAGNOSIS — R7303 Prediabetes: Secondary | ICD-10-CM

## 2018-07-27 DIAGNOSIS — I10 Essential (primary) hypertension: Secondary | ICD-10-CM | POA: Diagnosis not present

## 2018-07-27 DIAGNOSIS — I708 Atherosclerosis of other arteries: Secondary | ICD-10-CM

## 2018-07-27 DIAGNOSIS — K219 Gastro-esophageal reflux disease without esophagitis: Secondary | ICD-10-CM | POA: Diagnosis not present

## 2018-07-27 DIAGNOSIS — M1000 Idiopathic gout, unspecified site: Secondary | ICD-10-CM

## 2018-07-27 DIAGNOSIS — E349 Endocrine disorder, unspecified: Secondary | ICD-10-CM

## 2018-07-27 MED ORDER — VALSARTAN 320 MG PO TABS: ORAL_TABLET | ORAL | 3 refills | Status: DC

## 2018-07-27 MED ORDER — ATENOLOL 100 MG PO TABS: ORAL_TABLET | ORAL | 3 refills | Status: DC

## 2018-07-27 NOTE — Patient Instructions (Addendum)
Leg Cramps:  Try changing timing of magnesium to evening or hour before bed.  We will contact you with results in 1-2 days.  We have sent in a prescription for both your blood pressure medications to [facility].  Continue increasing your walking on treadmill.  Be sure to stretch hamstring after exercise.      Preventive Care for Adults  A healthy lifestyle and preventive care can promote health and wellness. Preventive health guidelines for men include the following key practices:  A routine yearly physical is a good way to check with your health care provider about your health and preventative screening. It is a chance to share any concerns and updates on your health and to receive a thorough exam.  Visit your dentist for a routine exam and preventative care every 6 months. Brush your teeth twice a day and floss once a day. Good oral hygiene prevents tooth decay and gum disease.  The frequency of eye exams is based on your age, health, family medical history, use of contact lenses, and other factors. Follow your health care provider's recommendations for frequency of eye exams.  Eat a healthy diet. Foods such as vegetables, fruits, whole grains, low-fat dairy products, and lean protein foods contain the nutrients you need without too many calories. Decrease your intake of foods high in solid fats, added sugars, and salt. Eat the right amount of calories for you. Get information about a proper diet from your health care provider, if necessary.  Regular physical exercise is one of the most important things you can do for your health. Most adults should get at least 150 minutes of moderate-intensity exercise (any activity that increases your heart rate and causes you to sweat) each week. In addition, most adults need muscle-strengthening exercises on 2 or more days a week.  Maintain a healthy weight. The body mass index (BMI) is a screening tool to identify possible weight problems. It  provides an estimate of body fat based on height and weight. Your health care provider can find your BMI and can help you achieve or maintain a healthy weight. For adults 20 years and older:  A BMI below 18.5 is considered underweight.  A BMI of 18.5 to 24.9 is normal.  A BMI of 25 to 29.9 is considered overweight.  A BMI of 30 and above is considered obese.  Maintain normal blood lipids and cholesterol levels by exercising and minimizing your intake of saturated fat. Eat a balanced diet with plenty of fruit and vegetables. Blood tests for lipids and cholesterol should begin at age 53 and be repeated every 5 years. If your lipid or cholesterol levels are high, you are over 50, or you are at high risk for heart disease, you may need your cholesterol levels checked more frequently. Ongoing high lipid and cholesterol levels should be treated with medicines if diet and exercise are not working.  If you smoke, find out from your health care provider how to quit. If you do not use tobacco, do not start.  Lung cancer screening is recommended for adults aged 63-80 years who are at high risk for developing lung cancer because of a history of smoking. A yearly low-dose CT scan of the lungs is recommended for people who have at least a 30-pack-year history of smoking and are a current smoker or have quit within the past 15 years. A pack year of smoking is smoking an average of 1 pack of cigarettes a day for 1 year (for  example: 1 pack a day for 30 years or 2 packs a day for 15 years). Yearly screening should continue until the smoker has stopped smoking for at least 15 years. Yearly screening should be stopped for people who develop a health problem that would prevent them from having lung cancer treatment.  If you choose to drink alcohol, do not have more than 2 drinks per day. One drink is considered to be 12 ounces (355 mL) of beer, 5 ounces (148 mL) of wine, or 1.5 ounces (44 mL) of liquor.  Avoid use of  street drugs. Do not share needles with anyone. Ask for help if you need support or instructions about stopping the use of drugs.  High blood pressure causes heart disease and increases the risk of stroke. Your blood pressure should be checked at least every 1-2 years. Ongoing high blood pressure should be treated with medicines, if weight loss and exercise are not effective.  If you are 54-35 years old, ask your health care provider if you should take aspirin to prevent heart disease.  Diabetes screening involves taking a blood sample to check your fasting blood sugar level. This should be done once every 3 years, after age 12, if you are within normal weight and without risk factors for diabetes. Testing should be considered at a younger age or be carried out more frequently if you are overweight and have at least 1 risk factor for diabetes.  Colorectal cancer can be detected and often prevented. Most routine colorectal cancer screening begins at the age of 24 and continues through age 42. However, your health care provider may recommend screening at an earlier age if you have risk factors for colon cancer. On a yearly basis, your health care provider may provide home test kits to check for hidden blood in the stool. Use of a small camera at the end of a tube to directly examine the colon (sigmoidoscopy or colonoscopy) can detect the earliest forms of colorectal cancer. Talk to your health care provider about this at age 39, when routine screening begins. Direct exam of the colon should be repeated every 5-10 years through age 48, unless early forms of precancerous polyps or small growths are found.   Talk with your health care provider about prostate cancer screening.  Testicular cancer screening isrecommended for adult males. Screening includes self-exam, a health care provider exam, and other screening tests. Consult with your health care provider about any symptoms you have or any concerns you have  about testicular cancer.  Use sunscreen. Apply sunscreen liberally and repeatedly throughout the day. You should seek shade when your shadow is shorter than you. Protect yourself by wearing long sleeves, pants, a wide-brimmed hat, and sunglasses year round, whenever you are outdoors.  Once a month, do a whole-body skin exam, using a mirror to look at the skin on your back. Tell your health care provider about new moles, moles that have irregular borders, moles that are larger than a pencil eraser, or moles that have changed in shape or color.  Stay current with required vaccines (immunizations).  Influenza vaccine. All adults should be immunized every year.  Tetanus, diphtheria, and acellular pertussis (Td, Tdap) vaccine. An adult who has not previously received Tdap or who does not know his vaccine status should receive 1 dose of Tdap. This initial dose should be followed by tetanus and diphtheria toxoids (Td) booster doses every 10 years. Adults with an unknown or incomplete history of completing a 3-dose immunization  series with Td-containing vaccines should begin or complete a primary immunization series including a Tdap dose. Adults should receive a Td booster every 10 years.  Varicella vaccine. An adult without evidence of immunity to varicella should receive 2 doses or a second dose if he has previously received 1 dose.  Human papillomavirus (HPV) vaccine. Males aged 76-21 years who have not received the vaccine previously should receive the 3-dose series. Males aged 22-26 years may be immunized. Immunization is recommended through the age of 47 years for any male who has sex with males and did not get any or all doses earlier. Immunization is recommended for any person with an immunocompromised condition through the age of 41 years if he did not get any or all doses earlier. During the 3-dose series, the second dose should be obtained 4-8 weeks after the first dose. The third dose should be  obtained 24 weeks after the first dose and 16 weeks after the second dose.  Zoster vaccine. One dose is recommended for adults aged 78 years or older unless certain conditions are present.    PREVNAR  - Pneumococcal 13-valent conjugate (PCV13) vaccine. When indicated, a person who is uncertain of his immunization history and has no record of immunization should receive the PCV13 vaccine. An adult aged 75 years or older who has certain medical conditions and has not been previously immunized should receive 1 dose of PCV13 vaccine. This PCV13 should be followed with a dose of pneumococcal polysaccharide (PPSV23) vaccine. The PPSV23 vaccine dose should be obtained at least 1 r more year(s) after the dose of PCV13 vaccine. An adult aged 30 years or older who has certain medical conditions and previously received 1 or more doses of PPSV23 vaccine should receive 1 dose of PCV13. The PCV13 vaccine dose should be obtained 1 or more years after the last PPSV23 vaccine dose.    PNEUMOVAX - Pneumococcal polysaccharide (PPSV23) vaccine. When PCV13 is also indicated, PCV13 should be obtained first. All adults aged 98 years and older should be immunized. An adult younger than age 74 years who has certain medical conditions should be immunized. Any person who resides in a nursing home or long-term care facility should be immunized. An adult smoker should be immunized. People with an immunocompromised condition and certain other conditions should receive both PCV13 and PPSV23 vaccines. People with human immunodeficiency virus (HIV) infection should be immunized as soon as possible after diagnosis. Immunization during chemotherapy or radiation therapy should be avoided. Routine use of PPSV23 vaccine is not recommended for American Indians, [place] Natives, or people younger than 65 years unless there are medical conditions that require PPSV23 vaccine. When indicated, people who have unknown immunization and have no record  of immunization should receive PPSV23 vaccine. One-time revaccination 5 years after the first dose of PPSV23 is recommended for people aged 19-64 years who have chronic kidney failure, nephrotic syndrome, asplenia, or immunocompromised conditions. People who received 1-2 doses of PPSV23 before age 79 years should receive another dose of PPSV23 vaccine at age 50 years or later if at least 5 years have passed since the previous dose. Doses of PPSV23 are not needed for people immunized with PPSV23 at or after age 61 years.    Hepatitis A vaccine. Adults who wish to be protected from this disease, have certain high-risk conditions, work with hepatitis A-infected animals, work in hepatitis A research labs, or travel to or work in countries with a high rate of hepatitis A should be immunized.  Adults who were previously unvaccinated and who anticipate close contact with an international adoptee during the first 60 days after arrival in the [place] from a country with a high rate of hepatitis A should be immunized.    Hepatitis B vaccine. Adults should be immunized if they wish to be protected from this disease, have certain high-risk conditions, may be exposed to blood or other infectious body fluids, are household contacts or sex partners of hepatitis B positive people, are clients or workers in certain care facilities, or travel to or work in countries with a high rate of hepatitis B.   Preventive Service / Frequency   Ages 30 to 8  Blood pressure check.  Lipid and cholesterol check  Lung cancer screening. / Every year if you are aged 25-80 years and have a 30-pack-year history of smoking and currently smoke or have quit within the past 15 years. Yearly screening is stopped once you have quit smoking for at least 15 years or develop a health problem that would prevent you from having lung cancer treatment.  Fecal occult blood test (FOBT) of stool. / Every year beginning at age 74 and  continuing until age 43. You may not have to do this test if you get a colonoscopy every 10 years.  Flexible sigmoidoscopy** or colonoscopy.** / Every 5 years for a flexible sigmoidoscopy or every 10 years for a colonoscopy beginning at age 12 and continuing until age 20. Screening for abdominal aortic aneurysm (AAA)  by ultrasound is recommended for people who have history of high blood pressure or who are current or former smokers. +++++++++++ Recommend Adult Low Dose Aspirin or  coated  Aspirin 81 mg daily  To reduce risk of Colon Cancer 20 %,  Skin Cancer 26 % ,  Malignant Melanoma 46%  and  Pancreatic cancer 60% ++++++++++++++++++++ Vitamin D goal  is between 70-100.  Please make sure that you are taking your Vitamin D as directed.  It is very important as a natural anti-inflammatory  helping hair, skin, and nails, as well as reducing stroke and heart attack risk.  It helps your bones and helps with mood. It also decreases numerous cancer risks so please take it as directed.  Low Vit D is associated with a 200-300% higher risk for CANCER  and 200-300% higher risk for HEART   ATTACK  &  STROKE.   .....................................Marland Kitchen It is also associated with higher death rate at younger ages,  autoimmune diseases like Rheumatoid arthritis, Lupus, Multiple Sclerosis.    Also many other serious conditions, like depression, Alzheimer's Dementia, infertility, muscle aches, fatigue, fibromyalgia - just to name a few. +++++++++++++++++++++ Recommend the book "The END of DIETING" by Dr Excell Seltzer  & the book "The END of DIABETES " by Dr Excell Seltzer At [email or web address] - get book & Audio CD's    Being diabetic has a  300% increased risk for heart attack, stroke, cancer, and alzheimer- type vascular dementia. It is very important that you work harder with diet by avoiding all foods that are white. Avoid white rice (brown & wild rice is OK), white potatoes (sweetpotatoes in moderation is  OK), White bread or wheat bread or anything made out of white flour like bagels, donuts, rolls, buns, biscuits, cakes, pastries, cookies, pizza crust, and pasta (made from white flour & egg whites) - vegetarian pasta or spinach or wheat pasta is OK. Multigrain breads like Arnold's or Pepper[employer], or multigrain sandwich thins or flatbreads.  Diet,  exercise and weight loss can reverse and cure diabetes in the early stages.  Diet, exercise and weight loss is very important in the control and prevention of complications of diabetes which affects every system in your body, ie. Brain - dementia/stroke, eyes - glaucoma/blindness, heart - heart attack/heart failure, kidneys - dialysis, stomach - gastric paralysis, intestines - malabsorption, nerves - severe painful neuritis, circulation - gangrene & loss of a leg(s), and finally cancer and Alzheimers.    I recommend avoid fried & greasy foods,  sweets/candy, white rice (brown or wild rice or Quinoa is OK), white potatoes (sweet potatoes are OK) - anything made from white flour - bagels, doughnuts, rolls, buns, biscuits,white and wheat breads, pizza crust and traditional pasta made of white flour & egg white(vegetarian pasta or spinach or wheat pasta is OK).  Multi-grain bread is OK - like multi-grain flat bread or sandwich thins. Avoid alcohol in excess. Exercise is also important.    Eat all the vegetables you want - avoid meat, especially red meat and dairy - especially cheese.  Cheese is the most concentrated form of trans-fats which is the worst thing to clog up our arteries. Veggie cheese is OK which can be found in the fresh produce section at [employer] or [employer] or [employer]  ++++++++++++++++++++++ DASH Eating Plan  DASH stands for "Dietary Approaches to Stop Hypertension."   The DASH eating plan is a healthy eating plan that has been shown to reduce high blood pressure (hypertension). Additional health benefits may include reducing the risk  of type 2 diabetes mellitus, heart disease, and stroke. The DASH eating plan may also help with weight loss. WHAT DO I NEED TO KNOW ABOUT THE DASH EATING PLAN? For the DASH eating plan, you will follow these general guidelines:  Choose foods with a percent daily value for sodium of less than 5% (as listed on the food label).  Use salt-free seasonings or herbs instead of table salt or sea salt.  Check with your health care provider or pharmacist before using salt substitutes.  Eat lower-sodium products, often labeled as "lower sodium" or "no salt added."  Eat fresh foods.  Eat more vegetables, fruits, and low-fat dairy products.  Choose whole grains. Look for the word "whole" as the first word in the ingredient list.  Choose fish   Limit sweets, desserts, sugars, and sugary drinks.  Choose heart-healthy fats.  Eat veggie cheese   Eat more home-cooked food and less restaurant, buffet, and fast food.  Limit fried foods.  Cook foods using methods other than frying.  Limit canned vegetables. If you do use them, rinse them well to decrease the sodium.  When eating at a restaurant, ask that your food be prepared with less salt, or no salt if possible.                      WHAT FOODS CAN I EAT? Read Dr Fara Olden Fuhrman's books on The End of Dieting & The End of Diabetes  Grains Whole grain or whole wheat bread. Brown rice. Whole grain or whole wheat pasta. Quinoa, bulgur, and whole grain cereals. Low-sodium cereals. Corn or whole wheat flour tortillas. Whole grain cornbread. Whole grain crackers. Low-sodium crackers.  Vegetables Fresh or frozen vegetables (raw, steamed, roasted, or grilled). Low-sodium or reduced-sodium tomato and vegetable juices. Low-sodium or reduced-sodium tomato sauce and paste. Low-sodium or reduced-sodium canned vegetables.   Fruits All fresh, canned (in natural juice), or frozen fruits.  Protein Products  All fish and seafood.  Dried beans, peas, or  lentils. Unsalted nuts and seeds. Unsalted canned beans.  Dairy Low-fat dairy products, such as skim or 1% milk, 2% or reduced-fat cheeses, low-fat ricotta or cottage cheese, or plain low-fat yogurt. Low-sodium or reduced-sodium cheeses.  Fats and Oils Tub margarines without trans fats. Light or reduced-fat mayonnaise and salad dressings (reduced sodium). Avocado. Safflower, olive, or canola oils. Natural peanut or almond butter.  Other Unsalted popcorn and pretzels. The items listed above may not be a complete list of recommended foods or beverages. Contact your dietitian for more options.  +++++++++++++++++++  WHAT FOODS ARE NOT RECOMMENDED? Grains/ White flour or wheat flour White bread. White pasta. White rice. Refined cornbread. Bagels and croissants. Crackers that contain trans fat.  Vegetables  Creamed or fried vegetables. Vegetables in a . Regular canned vegetables. Regular canned tomato sauce and paste. Regular tomato and vegetable juices.  Fruits Dried fruits. Canned fruit in light or heavy syrup. Fruit juice.  Meat and Other Protein Products Meat in general - RED meat & White meat.  Fatty cuts of meat. Ribs, chicken wings, all processed meats as bacon, sausage, bologna, salami, fatback, hot dogs, bratwurst and packaged luncheon meats.  Dairy Whole or 2% milk, cream, half-and-half, and cream cheese. Whole-fat or sweetened yogurt. Full-fat cheeses or blue cheese. Non-dairy creamers and whipped toppings. Processed cheese, cheese spreads, or cheese curds.  Condiments Onion and garlic salt, seasoned salt, table salt, and sea salt. Canned and packaged gravies. Worcestershire sauce. Tartar sauce. Barbecue sauce. Teriyaki sauce. Soy sauce, including reduced sodium. Steak sauce. Fish sauce. Oyster sauce. Cocktail sauce. Horseradish. Ketchup and mustard. Meat flavorings and tenderizers. Bouillon cubes. Hot sauce. Tabasco sauce. Marinades. Taco seasonings. Relishes.  Fats and  Oils Butter, stick margarine, lard, shortening and bacon fat. Coconut, palm kernel, or palm oils. Regular salad dressings.  Pickles and olives. Salted popcorn and pretzels.  The items listed above may not be a complete list of foods and beverages to avoid.

## 2018-07-27 NOTE — Progress Notes (Deleted)
FOLLOW UP  Assessment and Plan:   Christopher [name] was seen today for follow-up.  Diagnoses and all orders for this visit:  Essential hypertension  Aorto-iliac atherosclerosis (HCC)  Gastroesophageal reflux disease, esophagitis presence not specified  CKD (chronic kidney disease) stage 3, GFR 30-59 ml/min (HCC)  Mixed hyperlipidemia  Prediabetes  Idiopathic gout, unspecified chronicity, unspecified site  Vitamin D deficiency  Medication management  Morbid obesity ([place])  Testosterone deficiency  Depression with anxiety    Hypertension Taking Diovan 320mg  daily, Atenolol 100mg  QHS and Lasix 40mg  BID  Monitor blood pressure at home; patient to call if consistently greater than 130/80 Continue DASH diet.   Reminder to go to the ER if any CP, SOB, nausea, dizziness, severe HA, changes vision/speech, left arm numbness and tingling and jaw pain.  Cholesterol Currently at goal;  Continue low cholesterol diet and exercise.  Check lipid panel.   Diabetes {with or without complications:30421263} Continue medication: Continue diet and exercise.  Perform daily foot/skin check, notify office of any concerning changes.  Check A1C  Obesity with co morbidities Long discussion about weight loss, diet, and exercise Recommended diet heavy in fruits and veggies and low in animal meats, cheeses, and dairy products, appropriate calorie intake Discussed ideal weight for height (below ***) and initial weight goal (***) Patient will work on *** Will follow up in 3 months  Vitamin D Def At goal at last visit; continue supplementation to maintain goal of 70-100 Defer Vit D level  Continue diet and meds as discussed. Further disposition pending results of labs. Discussed med's effects and SE's.   Over 30 minutes of exam, counseling, chart review, and critical decision making was performed.   Future Appointments  Date Time Provider [place]  11/01/2018  2:00 PM Unk Pinto,  MD [facility] None    ----------------------------------------------------------------------------------------------------------------------  HPI 62 y.o. male  presents for 3 month follow up on hypertension, cholesterol, diabetes, weight and vitamin D deficiency.   This nice 18 [name] w /accelerated HTN at 192/120 on 26 August on Diovan 160/25 & he had Atenolol 100 mg added.  BP's remained elevated and on 3 Sept his BP checked at 180/106 and re-checked at 153/104 and  Minoxidil 10 mg was added. By 23 Sept, his BP's were better, altho reported still high in the range 150-160/100. Because of ankle edema, he had doubled his Lasix dose and stopped his Minoxidil .  Renal Artery U/s was done since then & was negative.                                                   Repotrs he is recovering from torn hamstring and he has been walking on the treadmill.  He is hoping to do this daily.  Her is walking for 10-47min.        BMI is Body mass index is 44.65 kg/m., he {HAS HAS TMH:96222} been working on diet and exercise. Wt Readings from Last 3 Encounters:  07/27/18 (!) 329 lb 3.2 oz (149.3 kg)  06/15/18 (!) 322 lb 3.2 oz (146.1 kg)  05/24/18 (!) 323 lb 6.4 oz (146.7 kg)    His blood pressure {HAS HAS NOT:18834} been controlled at home, today their BP is BP: 132/80  He {DOES_DOES LNL:89211} workout. He denies chest pain, shortness of breath, dizziness.   He {ACTION; IS/IS HER:74081448} on cholesterol  medication {Cholesterol meds:21887} and denies myalgias. His cholesterol {ACTION; IS/IS NOT:21021397} at goal. The cholesterol last visit was:   Lab Results  Component Value Date   CHOL 171 04/26/2018   HDL 43 04/26/2018   LDLCALC 109 (H) 04/26/2018   TRIG 99 04/26/2018   CHOLHDL 4.0 04/26/2018    He {Has/has not:18111} been working on diet and exercise for prediabetes, and denies {Symptoms; diabetes w/o none:19199}. Last A1C in the office was:  Lab Results  Component Value Date   HGBA1C 5.6  04/26/2018   Patient is on Vitamin D supplement.   Lab Results  Component Value Date   VD25OH 46 04/26/2018        Current Medications:  Current Outpatient Medications on File Prior to Visit  Medication Sig  . 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 allopurinol (ZYLOPRIM) 300 MG tablet TAKE 1 TABLET BY MOUTH  DAILY FOR GOUT PREVENTION  . ALPRAZolam (XANAX) 1 MG tablet Take 1/2-1 tablet 2-3 x / day ONLY if Needed for Anxiety Attack  & limit to 5 days /week to avoid addiction  . aspirin EC 81 MG tablet Take 81 mg by mouth daily.  Marland Kitchen atenolol (TENORMIN) 100 MG tablet Take 1 tablet daily for BP  . buPROPion (WELLBUTRIN XL) 300 MG 24 hr tablet TAKE 1 TABLET BY MOUTH  EVERY MORNING  . Cyanocobalamin (VITAMIN B 12 PO) Take 1 tablet by mouth daily.    . cyclobenzaprine (FLEXERIL) 10 MG tablet Take at bedtime for muscle spasm - Do not take with Alprazolam / Xanax  . esomeprazole (NEXIUM) 40 MG capsule Take 1 capsule 2 x / day for Barrett's Esophagus  . fenofibrate 160 MG tablet TAKE 1 TABLET BY MOUTH  DAILY  . fish oil-omega-3 fatty acids 1000 MG capsule Take 1 g by mouth daily.   . Flaxseed, Linseed, (FLAX SEEDS PO) Take by mouth.  . furosemide (LASIX) 40 MG tablet Take 1 tablet (40 mg total) by mouth 2 (two) times daily.  . magnesium oxide (MAG-OX) 400 MG tablet Take 400 mg by mouth 2 (two) times daily.   . meloxicam (MOBIC) 15 MG tablet Take 1/2 to 1 tablet daily with food for pain & inflammation - maximum 4 days /week to protect kidneys  . methocarbamol (ROBAXIN) 500 MG tablet TAKE 1 TABLET BY MOUTH AT  BEDTIME  . montelukast (SINGULAIR) 10 MG tablet TAKE 1 TABLET BY MOUTH AT  BEDTIME  . Multiple Vitamin (MULTIVITAMIN WITH MINERALS) TABS tablet Take 1 tablet by mouth daily.  . potassium chloride (K-DUR,KLOR-CON) 10 MEQ tablet TAKE 1 TABLET BY MOUTH 3  TIMES DAILY  . TESTIM 50 MG/5GM (1%) GEL APPLY CONTENTS OF 1 TUBE  TOPICALLY DAILY  . valsartan (DIOVAN)  320 MG tablet Take 1 tablet every morning for BP  . vitamin C (ASCORBIC ACID) 500 MG tablet Take 500 mg by mouth daily.   . Vitamin D, Ergocalciferol, (DRISDOL) 50000 units CAPS capsule TAKE 1 CAPSULE BY MOUTH  DAILY   No current facility-administered medications on file prior to visit.      Allergies: No Known Allergies   Medical History:  Past Medical History:  Diagnosis Date  . Allergy   . Anxiety   . Asthma   . Barrett's esophagus   . GERD (gastroesophageal reflux disease)   . Gout   . H/O hiatal hernia   . Hyperlipidemia   . Hypertension   . Prediabetes   .  Sleep apnea   . Vitamin D deficiency    Family history- Reviewed and unchanged Social history- Reviewed and unchanged   Review of Systems:  ROS    Physical Exam: BP 132/80   Pulse 65   Temp 98.5 F (36.9 C)   Wt (!) 329 lb 3.2 oz (149.3 kg)   SpO2 94%   BMI 44.65 kg/m  Wt Readings from Last 3 Encounters:  07/27/18 (!) 329 lb 3.2 oz (149.3 kg)  06/15/18 (!) 322 lb 3.2 oz (146.1 kg)  05/24/18 (!) 323 lb 6.4 oz (146.7 kg)   General Appearance: Well nourished, in no apparent distress. Eyes: PERRLA, EOMs, conjunctiva no swelling or erythema Sinuses: No Frontal/maxillary tenderness ENT/Mouth: Ext aud canals clear, TMs without erythema, bulging. No erythema, swelling, or exudate on post pharynx.  Tonsils not swollen or erythematous. Hearing normal.  Neck: Supple, thyroid normal.  Respiratory: Respiratory effort normal, BS equal bilaterally without rales, rhonchi, wheezing or stridor.  Cardio: RRR with no MRGs. Brisk peripheral pulses without edema.  Abdomen: Soft, + BS.  Non tender, no guarding, rebound, hernias, masses. Lymphatics: Non tender without lymphadenopathy.  Musculoskeletal: Full ROM, 5/5 strength, {PSY - GAIT AND STATION:22860} gait Skin: Warm, dry without rashes, lesions, ecchymosis.  Neuro: Cranial nerves intact. No cerebellar symptoms.  Psych: Awake and oriented X 3, normal affect, Insight  and Judgment appropriate.     Garnet Sierras, NP 5:18 PM [facility]

## 2018-07-28 LAB — COMPLETE METABOLIC PANEL WITHOUT GFR
AG Ratio: 1.8 (calc) (ref 1.0–2.5)
ALT: 18 U/L (ref 9–46)
AST: 21 U/L (ref 10–35)
Albumin: 4.5 g/dL (ref 3.6–5.1)
Alkaline phosphatase (APISO): 53 U/L (ref 40–115)
BUN: 13 mg/dL (ref 7–25)
CO2: 28 mmol/L (ref 20–32)
Calcium: 9.5 mg/dL (ref 8.6–10.3)
Chloride: 103 mmol/L (ref 98–110)
Creat: 1.15 mg/dL (ref 0.70–1.25)
GFR, Est African American: 80 mL/min/1.73m2
GFR, Est Non African American: 69 mL/min/1.73m2
Globulin: 2.5 g/dL (ref 1.9–3.7)
Glucose, Bld: 88 mg/dL (ref 65–99)
Potassium: 3.8 mmol/L (ref 3.5–5.3)
Sodium: 139 mmol/L (ref 135–146)
Total Bilirubin: 0.7 mg/dL (ref 0.2–1.2)
Total Protein: 7 g/dL (ref 6.1–8.1)

## 2018-07-28 LAB — CBC WITH DIFFERENTIAL/PLATELET
Basophils Absolute: 28 {cells}/uL (ref 0–200)
Basophils Relative: 0.4 %
Eosinophils Absolute: 161 {cells}/uL (ref 15–500)
Eosinophils Relative: 2.3 %
HCT: 41.1 % (ref 38.5–50.0)
Hemoglobin: 14.6 g/dL (ref 13.2–17.1)
Lymphs Abs: 2247 {cells}/uL (ref 850–3900)
MCH: 30.9 pg (ref 27.0–33.0)
MCHC: 35.5 g/dL (ref 32.0–36.0)
MCV: 86.9 fL (ref 80.0–100.0)
MPV: 11.2 fL (ref 7.5–12.5)
Monocytes Relative: 12.3 %
Neutro Abs: 3703 {cells}/uL (ref 1500–7800)
Neutrophils Relative %: 52.9 %
Platelets: 146 Thousand/uL (ref 140–400)
RBC: 4.73 Million/uL (ref 4.20–5.80)
RDW: 13.4 % (ref 11.0–15.0)
Total Lymphocyte: 32.1 %
WBC mixed population: 861 {cells}/uL (ref 200–950)
WBC: 7 Thousand/uL (ref 3.8–10.8)

## 2018-07-28 LAB — VITAMIN D 25 HYDROXY (VIT D DEFICIENCY, FRACTURES): Vit D, 25-Hydroxy: 52 ng/mL (ref 30–100)

## 2018-07-28 LAB — LIPID PANEL
Cholesterol: 170 mg/dL (ref <200)
HDL: 42 mg/dL (ref >40)
LDL Cholesterol (Calc): 110 mg/dL — ABNORMAL HIGH
Non-HDL Cholesterol (Calc): 128 mg/dL (ref <130)
Total CHOL/HDL Ratio: 4 (calc) (ref <5.0)
Triglycerides: 87 mg/dL (ref <150)

## 2018-07-28 LAB — MAGNESIUM: Magnesium: 1.7 mg/dL (ref 1.5–2.5)

## 2018-07-28 LAB — HEMOGLOBIN A1C
Hgb A1c MFr Bld: 5.8 %{Hb} — ABNORMAL HIGH (ref <5.7)
Mean Plasma Glucose: 120 (calc)
eAG (mmol/L): 6.6 (calc)

## 2018-07-28 LAB — TSH: TSH: 1.82 mIU/L (ref 0.40–4.50)

## 2018-07-28 LAB — INSULIN, RANDOM: Insulin: 8.7 u[IU]/mL (ref 2.0–19.6)

## 2018-08-02 ENCOUNTER — Other Ambulatory Visit: Payer: Self-pay | Admitting: Internal Medicine

## 2018-08-02 DIAGNOSIS — R450 Nervousness: Secondary | ICD-10-CM

## 2018-08-04 NOTE — Progress Notes (Signed)
FOLLOW UP  Assessment and Plan:   Albertus was seen today for follow-up.  Diagnoses and all orders for this visit:  Essential hypertension Continue current regiment Monitor blood pressure at home -     CBC with Differential/Platelet -     COMPLETE METABOLIC PANEL WITH GFR -     Magnesium -     TSH -Rx     valsartan (DIOVAN) 320 MG tablet; Take 1 tablet every morning for BP -   Rx  atenolol (TENORMIN) 100 MG tablet; Take 1 tablet daily for BP Taking Lasix 40mg , only taking once a day not twice as prescribed.  Monitor blood pressure at home; patient to call if consistently greater than 130/80 Continue DASH diet.   Reminder to go to the ER if any CP, SOB, nausea, dizziness, severe HA, changes vision/speech, left arm numbness and tingling and jaw pain.  Aorto-iliac atherosclerosis (HCC) Continue to monitor Check Lipids  Gastroesophageal reflux disease, esophagitis presence not specified Doing well on Nexium -     Magnesium  CKD (chronic kidney disease) stage 3, GFR 30-59 ml/min (HCC) Increase fluids, avoid NSAIDS, monitor sugars, will monitor Will check CMP  Mixed hyperlipidemia decrease fatty foods increase activity.  -     Lipid panel  Idiopathic gout, unspecified chronicity, unspecified site Gout- recheck Uric acid as needed defer today, Diet discussed, continue medications.  Morbid obesity ([place]) - follow up 3 months for progress monitoring - increase veggies, decrease carbs - long discussion about weight loss, diet, and exercise -     Hemoglobin A1c -     Insulin, random -     TSH  Testosterone deficiency Does not want labs checked at this time Not going to use replacement therapy  Depression with anxiety Doing well on current regiment Continue Bupropion 300mg  daily and prn alprazolam.  Leg cramps, sleep related Change timing of magnesium supplementation to night  Vitamin D deficiency -     VITAMIN D 25 Hydroxy   Prediabetes Discussed dietary  modifications and exercise He walks at work, discussed adding exercise to this  Medication management -     CBC with Differential/Platelet -     COMPLETE METABOLIC PANEL WITH GFR -     Magnesium -     Lipid panel -     Hemoglobin A1c -     Insulin, random -     VITAMIN D 25 Hydroxy (Vit-D Deficiency, Fractures) -     TSH   Call or return with new or worsening symptoms as discussed in appointment.  May contact via office phone [phone] or via [place].   Continue diet and meds as discussed. Further disposition pending results of labs. Discussed med's effects and SE's.   Over 30 minutes of exam, counseling, chart review, and critical decision making was performed.   Future Appointments  Date Time Provider [place]  11/01/2018  2:00 PM Unk Pinto, MD [facility] None    ----------------------------------------------------------------------------------------------------------------------  HPI 62 y.o. male  presents for 3 month follow up on hypertension, cholesterol, GERD, CKD, depression w/anxiety and pre-diabetes, weight and vitamin D deficiency.   This pleasant 89 [name] with hypertension which is now controlled on Diovan 320mg  and Atenolol 100mg  daily.  This was a recent change after discontinuation of Minoxidil.  He had a Renal Artery ultrasound that  was negative.     He reports he is having an increase in leg cramps over the past couple weeks.  Reports this is a chronic issue for him.  He takes magnesium 400mg  daily in the morning.  He is curious about his labs today since the increase in cramping.                                               Repotrs he is recovering from torn hamstring and he has been walking on the treadmill.  He is hoping to do this daily.  He is walking for 10-71min and plans on increasing this.          BMI is Body mass index is 44.65 kg/m., he has been working on diet and exercise. Wt Readings from Last 3 Encounters:  07/27/18 (!) 329 lb  3.2 oz (149.3 kg)  06/15/18 (!) 322 lb 3.2 oz (146.1 kg)  05/24/18 (!) 323 lb 6.4 oz (146.7 kg)    His blood pressure has been controlled at home, today their BP is BP: 132/80  He does workout. He denies chest pain, shortness of breath, dizziness.   He is not on cholesterol medication His cholesterol is not at goal. The cholesterol last visit was:   Lab Results  Component Value Date   CHOL 170 07/27/2018   HDL 42 07/27/2018   LDLCALC 110 (H) 07/27/2018   TRIG 87 07/27/2018   CHOLHDL 4.0 07/27/2018    He has been working on diet and exercise for prediabetes, and denies hyperglycemia, hypoglycemia , polydipsia and polyuria. Last A1C in the office was:  Lab Results  Component Value Date   HGBA1C 5.8 (H) 07/27/2018   Patient is on Vitamin D supplement.   Lab Results  Component Value Date   VD25OH 52 07/27/2018        Current Medications:  Current Outpatient Medications on File Prior to Visit  Medication Sig  . 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 allopurinol (ZYLOPRIM) 300 MG tablet TAKE 1 TABLET BY MOUTH  DAILY FOR GOUT PREVENTION  . aspirin EC 81 MG tablet Take 81 mg by mouth daily.  Marland Kitchen buPROPion (WELLBUTRIN XL) 300 MG 24 hr tablet TAKE 1 TABLET BY MOUTH  EVERY MORNING  . Cyanocobalamin (VITAMIN B 12 PO) Take 1 tablet by mouth daily.    . cyclobenzaprine (FLEXERIL) 10 MG tablet Take at bedtime for muscle spasm - Do not take with Alprazolam / Xanax  . esomeprazole (NEXIUM) 40 MG capsule Take 1 capsule 2 x / day for Barrett's Esophagus  . fenofibrate 160 MG tablet TAKE 1 TABLET BY MOUTH  DAILY  . fish oil-omega-3 fatty acids 1000 MG capsule Take 1 g by mouth daily.   . Flaxseed, Linseed, (FLAX SEEDS PO) Take by mouth.  . furosemide (LASIX) 40 MG tablet Take 1 tablet (40 mg total) by mouth 2 (two) times daily.  . magnesium oxide (MAG-OX) 400 MG tablet Take 400 mg by mouth 2 (two) times daily.   . meloxicam (MOBIC) 15 MG tablet Take 1/2 to 1  tablet daily with food for pain & inflammation - maximum 4 days /week to protect kidneys  . methocarbamol (ROBAXIN) 500 MG tablet TAKE 1 TABLET BY MOUTH AT  BEDTIME  . montelukast (SINGULAIR) 10 MG tablet TAKE 1 TABLET BY MOUTH AT  BEDTIME  . Multiple Vitamin (MULTIVITAMIN WITH MINERALS) TABS tablet Take 1 tablet by mouth daily.  . potassium chloride (K-DUR,KLOR-CON) 10 MEQ tablet TAKE 1  TABLET BY MOUTH 3  TIMES DAILY  . TESTIM 50 MG/5GM (1%) GEL APPLY CONTENTS OF 1 TUBE  TOPICALLY DAILY  . vitamin C (ASCORBIC ACID) 500 MG tablet Take 500 mg by mouth daily.   . Vitamin D, Ergocalciferol, (DRISDOL) 50000 units CAPS capsule TAKE 1 CAPSULE BY MOUTH  DAILY   No current facility-administered medications on file prior to visit.      Allergies: No Known Allergies   Medical History:  Past Medical History:  Diagnosis Date  . Allergy   . Anxiety   . Asthma   . Barrett's esophagus   . GERD (gastroesophageal reflux disease)   . Gout   . H/O hiatal hernia   . Hyperlipidemia   . Hypertension   . Prediabetes   . Sleep apnea   . Vitamin D deficiency    Family history- Reviewed and unchanged Social history- Reviewed and unchanged   Review of Systems:  Review of Systems  Constitutional: Negative for chills, diaphoresis, fever, malaise/fatigue and weight loss.  HENT: Negative for congestion, ear discharge, ear pain, hearing loss, nosebleeds, sinus pain, sore throat and tinnitus.   Eyes: Negative for blurred vision, double vision, photophobia, pain, discharge and redness.  Respiratory: Negative for cough, hemoptysis, sputum production, shortness of breath, wheezing and stridor.   Cardiovascular: Negative for chest pain, palpitations, orthopnea, claudication, leg swelling and PND.  Gastrointestinal: Negative for abdominal pain, blood in stool, constipation, diarrhea, heartburn, melena, nausea and vomiting.  Genitourinary: Negative for dysuria, flank pain, frequency, hematuria and urgency.   Musculoskeletal: Positive for myalgias. Negative for back pain, falls, joint pain and neck pain.       Bilateral leg cramps, intermittently, night  Skin: Negative for itching and rash.  Neurological: Negative for dizziness, tingling, tremors, sensory change, speech change, focal weakness, seizures, loss of consciousness, weakness and headaches.  Endo/Heme/Allergies: Negative for environmental allergies and polydipsia. Does not bruise/bleed easily.  Psychiatric/Behavioral: Negative for depression, hallucinations, memory loss, substance abuse and suicidal ideas. The patient is not nervous/anxious and does not have insomnia.      Physical Exam: BP 132/80   Pulse 65   Temp 98.5 F (36.9 C)   Wt (!) 329 lb 3.2 oz (149.3 kg)   SpO2 94%   BMI 44.65 kg/m  Wt Readings from Last 3 Encounters:  07/27/18 (!) 329 lb 3.2 oz (149.3 kg)  06/15/18 (!) 322 lb 3.2 oz (146.1 kg)  05/24/18 (!) 323 lb 6.4 oz (146.7 kg)   General Appearance: Well nourished, in no apparent distress. Eyes: PERRLA, EOMs, conjunctiva no swelling or erythema Sinuses: No Frontal/maxillary tenderness ENT/Mouth: Ext aud canals clear, TMs without erythema, bulging. No erythema, swelling, or exudate on post pharynx.  Tonsils not swollen or erythematous. Hearing normal.  Neck: Supple, thyroid normal.  Respiratory: Respiratory effort normal, BS equal bilaterally without rales, rhonchi, wheezing or stridor.  Cardio: RRR with no MRGs. Brisk peripheral pulses without edema.  Abdomen: Soft, + BS.  Non tender, no guarding, rebound, hernias, masses. Lymphatics: Non tender without lymphadenopathy.  Musculoskeletal: Full ROM, 5/5 strength, Normal gait Skin: Warm, dry without rashes, lesions, ecchymosis.  Neuro: Cranial nerves intact. No cerebellar symptoms.  Psych: Awake and oriented X 3, normal affect, Insight and Judgment appropriate.     Garnet Sierras, NP 3:42 PM [facility]

## 2018-08-05 ENCOUNTER — Encounter: Payer: Self-pay | Admitting: Adult Health Nurse Practitioner

## 2018-09-28 ENCOUNTER — Other Ambulatory Visit: Payer: Self-pay | Admitting: Internal Medicine

## 2018-09-28 DIAGNOSIS — R450 Nervousness: Secondary | ICD-10-CM

## 2018-10-19 IMAGING — US US RENAL ARTERY STENOSIS
1 series · 13 of 25 positions shown · non-contrast
Comparison: CT scan of the abdomen and pelvis with intravenous
contrast 01/08/2018

CLINICAL DATA: 62-year-old male with accelerated hypertension

EXAM:
RENAL/URINARY TRACT ULTRASOUND
RENAL DUPLEX DOPPLER ULTRASOUND

[Series 1: us renal artery stenosis · 0.25mm/px · 13 of 81 slices shown]
[im 1/81]
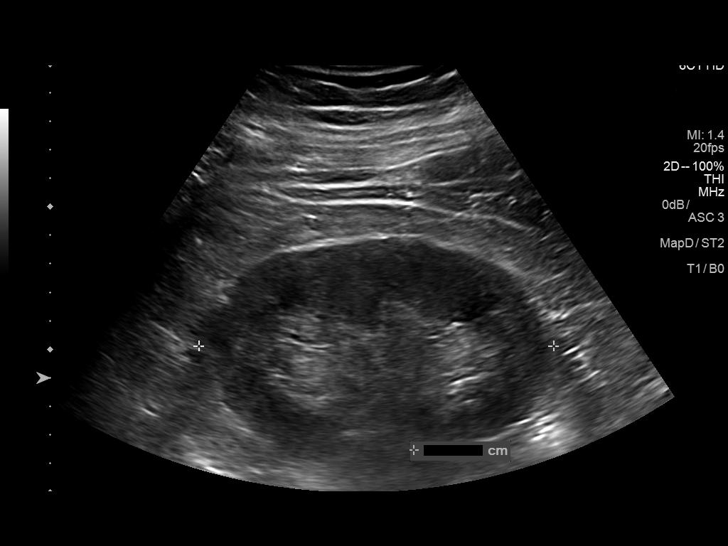
[im 7/81]
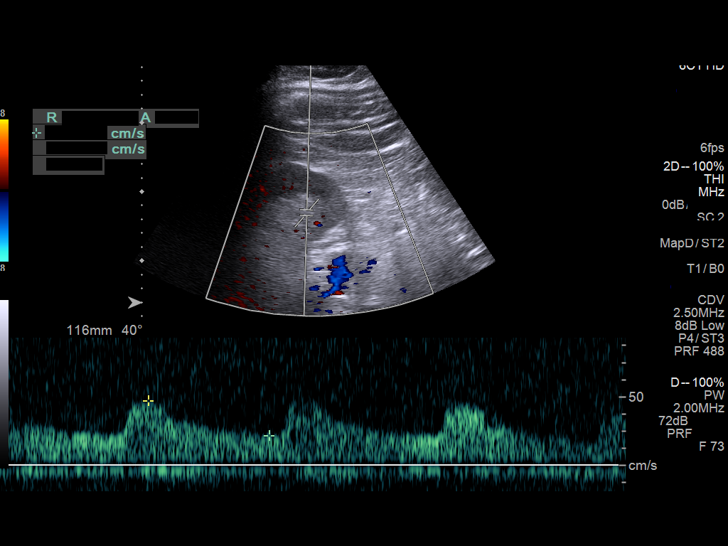
[im 14/81]
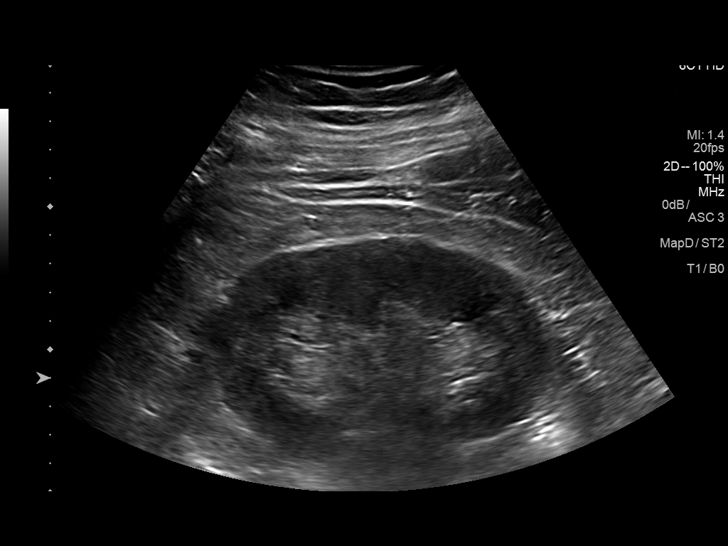
[im 21/81]
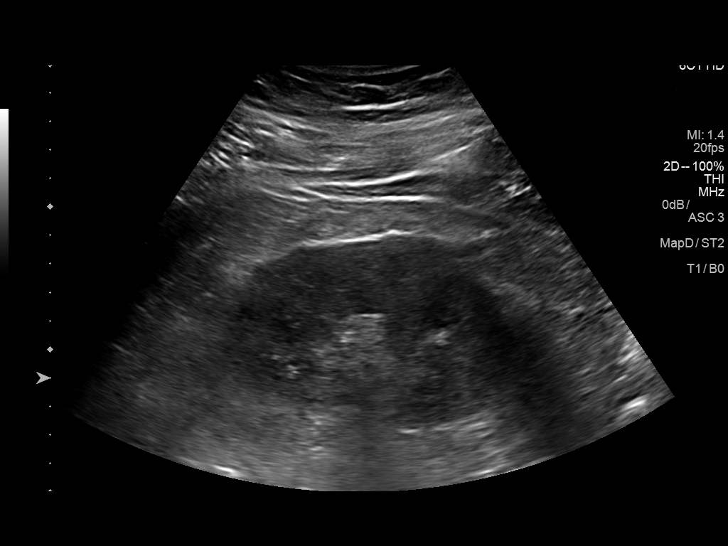
[im 27/81]
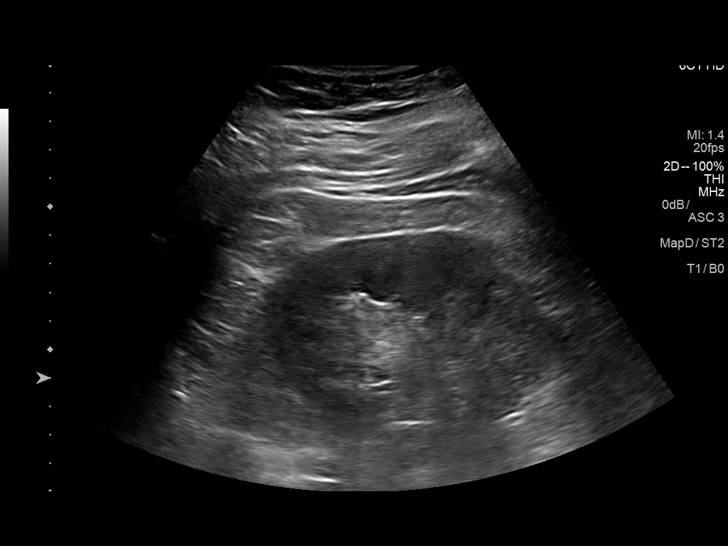
[im 34/81]
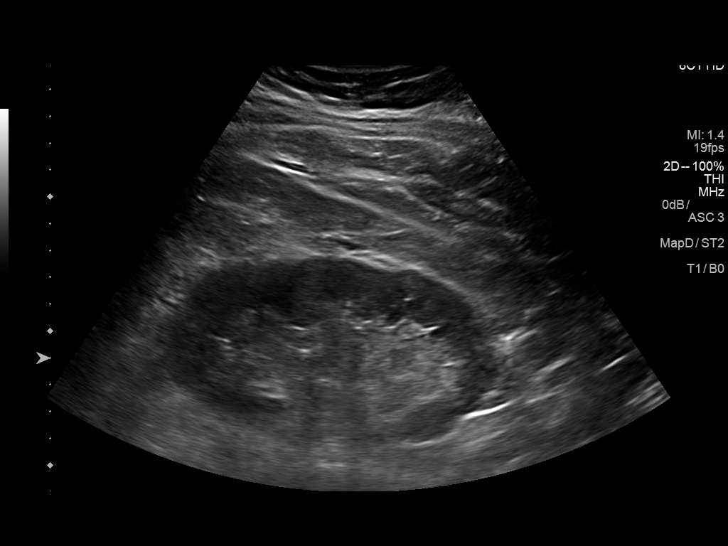
[im 41/81]
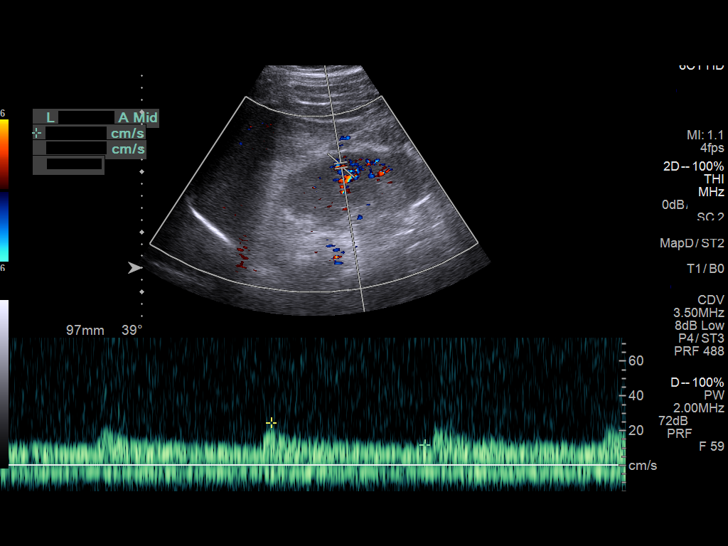
[im 47/81]
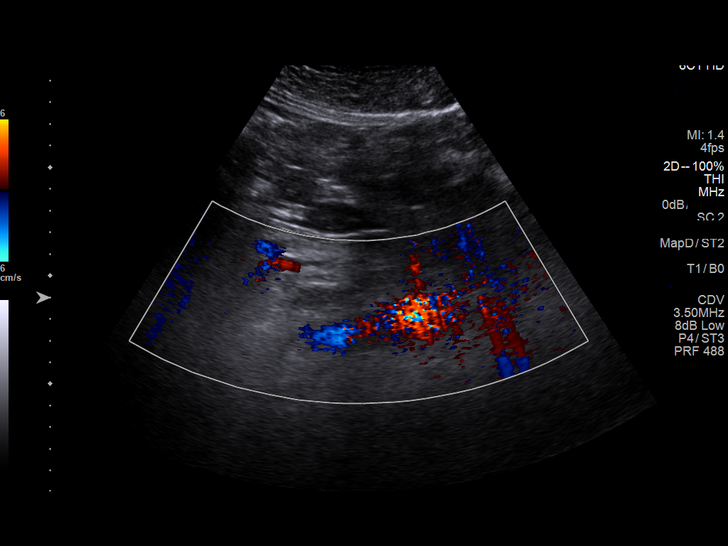
[im 54/81]
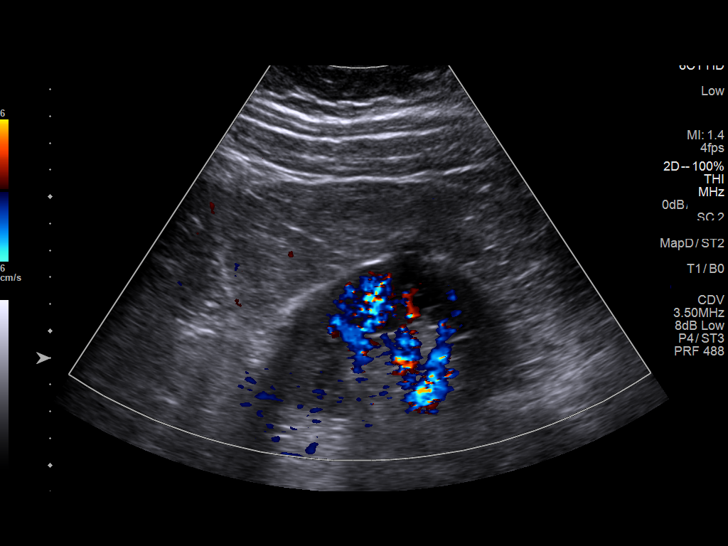
[im 61/81]
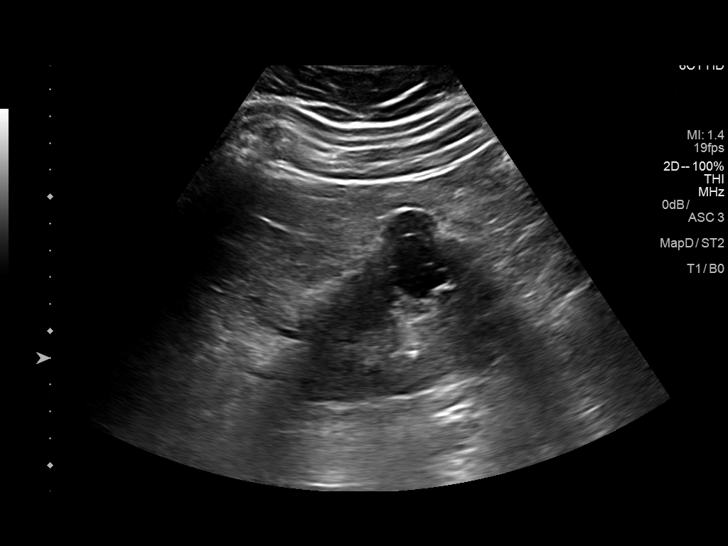
[im 67/81]
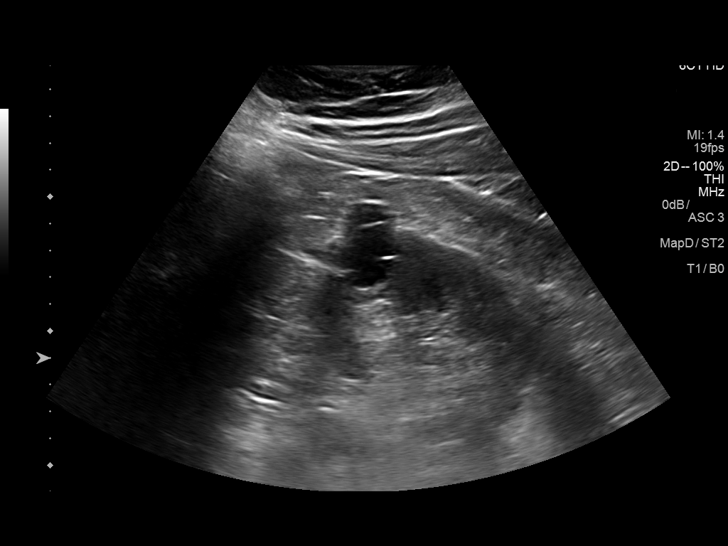
[im 74/81]
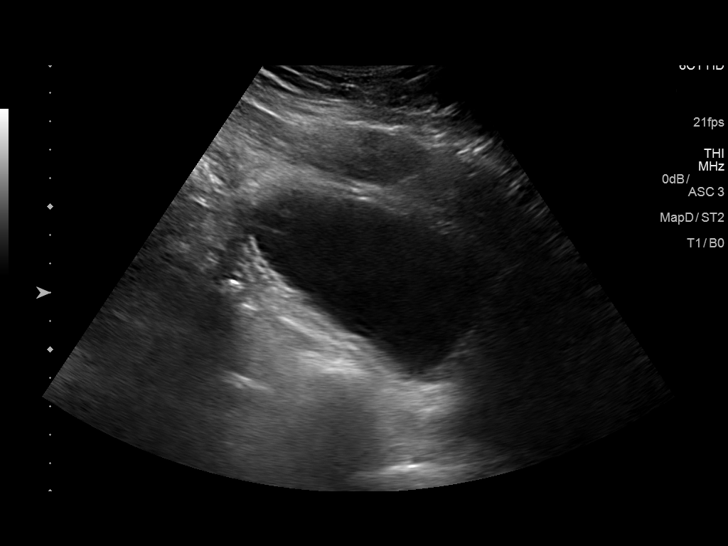
[im 81/81]
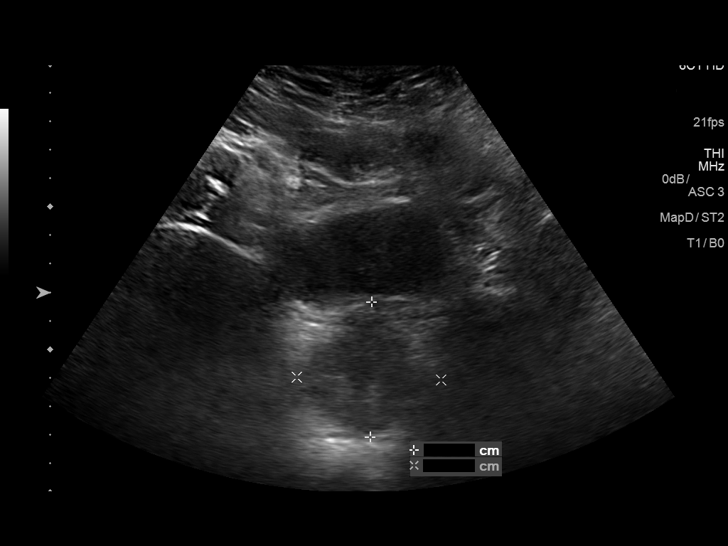

[13 of 25 positions shown; findings below may reference images not displayed]

FINDINGS: Right Kidney:

Length: 12.4 cm. Echogenicity within normal limits. No mass or
hydronephrosis visualized.

Left Kidney:

Length: 13.1 cm. Echogenicity within normal limits. No solid mass or
hydronephrosis. An ovoid, septated cystic structure which is nearly
anechoic centrally and demonstrates a very thin wall and thin
internal septations is noted exophytic from the interpolar left
kidney. The cyst measures a maximum of 3.8 x 2.7 x 3.3 cm. Similar
findings were evident on the prior CT scan..

Bladder:  Within normal limits.

RENAL DUPLEX ULTRASOUND

Right Renal Artery Velocities:

Origin:  117 cm/sec

Mid:  113 cm/sec

Hilum:  90 cm/sec

Interlobar:  52 cm/sec

Arcuate:  31 cm/sec

Left Renal Artery Velocities:

Origin:  77 cm/sec

Mid:  96 cm/sec

Hilum:  64 cm/sec

Interlobar:  39 cm/sec

Arcuate:  27 cm/sec

Aortic Velocity:  111 cm/sec

Right Renal-Aortic Ratios:

Origin: 1

Mid:  1

Hilum:

Interlobar:

Arcuate:

Left Renal-Aortic Ratios:

Origin:

Mid:

Hilum:

Interlobar:

Arcuate:

Renal veins: Both renal veins are patent at the renal hila.

Other:
IMPRESSION: 1. Negative for evidence of hemodynamically significant renal artery
stenosis.
2. Mildly complex left renal cyst as seen on the prior CT scan dated
01/08/2018.

## 2018-10-31 ENCOUNTER — Encounter: Payer: Self-pay | Admitting: Internal Medicine

## 2018-10-31 DIAGNOSIS — R7309 Other abnormal glucose: Secondary | ICD-10-CM | POA: Insufficient documentation

## 2018-10-31 DIAGNOSIS — Z6841 Body Mass Index (BMI) 40.0 and over, adult: Secondary | ICD-10-CM

## 2018-10-31 NOTE — Progress Notes (Signed)
[facility]   Unk Pinto, M.D.     Uvaldo Bristle. Silverio Lay, P.A.-C Liane Comber, [place]                [address] [place] [address] Telephone [phone] Telefax [phone] Annual  Screening/Preventative Visit  & Comprehensive Evaluation & Examination     This very nice 63 y.o. [name] presents for a Screening /Preventative Visit & comprehensive evaluation and management of multiple medical co-morbidities.  Patient has been followed for HTN, HLD, Prediabetes and Vitamin D Deficiency. Patient's Hyperuricemia is controlled on his meds. Patient has GERD / Barrett's (+ Bx) on Nexium & followed by Dr Loletha Carrow.     HTN predates circa 2005. Patient's BP has been controlled at home.  Today's BP is very elevated at 176/122 and rechecked at 170/90. Patient denies any cardiac symptoms as chest pain, palpitations, shortness of breath, dizziness or ankle swelling.     Patient's hyperlipidemia is controlled with diet and medications. Patient denies myalgias or other medication SE's. Last lipids were  Lab Results  Component Value Date   CHOL 170 07/27/2018   HDL 42 07/27/2018   LDLCALC 110 (H) 07/27/2018   TRIG 87 07/27/2018   CHOLHDL 4.0 07/27/2018      Patient has [place] / Compulsive Overeating with Morbid Obesity (BMI 45+) and prediabetes (A1c 5.9% / 2010, 6.1% / 2012 and 5.8% / 2016) and patient denies reactive hypoglycemic symptoms, visual blurring, diabetic polys or paresthesias. Last A1c was not at goal: Lab Results  Component Value Date   HGBA1C 5.8 (H) 07/27/2018       Patient has hx/o Testosterone Deficiency predating back to low level ("228") in 2001 and he declined replacement therapy.     Finally, patient has history of Vitamin D Deficiency  ("17" / 2008)  and last vitamin D was nearer goal  (70-100): Lab Results  Component Value Date   VD25OH 52 07/27/2018   Current Outpatient  Medications on File Prior to Visit  Medication Sig  . 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 allopurinol (ZYLOPRIM) 300 MG tablet TAKE 1 TABLET BY MOUTH  DAILY FOR GOUT PREVENTION  . ALPRAZolam (XANAX) 1 MG tablet Take 1/2-1 tablet 2 - 3 x /day ONLY if needed for Anxiety Attack &  limit to 5 days /week to avoid addiction  . aspirin EC 81 MG tablet Take 81 mg by mouth daily.  Marland Kitchen atenolol (TENORMIN) 100 MG tablet Take 1 tablet daily for BP  . buPROPion (WELLBUTRIN XL) 300 MG 24 hr tablet TAKE 1 TABLET BY MOUTH  EVERY MORNING  . Cyanocobalamin (VITAMIN B 12 PO) Take 1 tablet by mouth daily.    . cyclobenzaprine (FLEXERIL) 10 MG tablet Take at bedtime for muscle spasm - Do not take with Alprazolam / Xanax  . fenofibrate 160 MG tablet TAKE 1 TABLET BY MOUTH  DAILY  . fish oil-omega-3 fatty acids 1000 MG capsule Take 1 g by mouth daily.   . Flaxseed, Linseed, (FLAX SEEDS PO) Take by mouth.  . furosemide (LASIX) 40 MG tablet Take 1 tablet (40 mg total) by mouth 2 (two) times daily.  . magnesium oxide (MAG-OX) 400 MG tablet Take 400 mg by mouth 2 (two) times daily.   . meloxicam (MOBIC) 15 MG tablet Take 1/2 to 1  tablet daily with food for pain & inflammation - maximum 4 days /week to protect kidneys  . methocarbamol (ROBAXIN) 500 MG tablet TAKE 1 TABLET BY MOUTH AT  BEDTIME  . montelukast (SINGULAIR) 10 MG tablet TAKE 1 TABLET BY MOUTH AT  BEDTIME  . Multiple Vitamin (MULTIVITAMIN WITH MINERALS) TABS tablet Take 1 tablet by mouth daily.  . potassium chloride (K-DUR,KLOR-CON) 10 MEQ tablet TAKE 1 TABLET BY MOUTH 3  TIMES DAILY  . TESTIM 50 MG/5GM (1%) GEL APPLY CONTENTS OF 1 TUBE  TOPICALLY DAILY  . valsartan (DIOVAN) 320 MG tablet Take 1 tablet every morning for BP  . vitamin C (ASCORBIC ACID) 500 MG tablet Take 500 mg by mouth daily.   . Vitamin D, Ergocalciferol, (DRISDOL) 1.25 MG (50000 UT) CAPS capsule TAKE 1 CAPSULE BY MOUTH  DAILY  . esomeprazole (NEXIUM) 40  MG capsule Take 1 capsule 2 x / day for Barrett's Esophagus   No current facility-administered medications on file prior to visit.    No Known Allergies   Past Medical History:  Diagnosis Date  . Allergy   . Anxiety   . Asthma   . Barrett's esophagus   . GERD (gastroesophageal reflux disease)   . Gout   . H/O hiatal hernia   . Hyperlipidemia   . Hypertension   . Prediabetes   . Sleep apnea   . Vitamin D deficiency    Health Maintenance  Topic Date Due  . Hepatitis C Screening  1956/07/05  . HIV Screening  11/07/1972  . COLONOSCOPY  07/04/2019  . TETANUS/TDAP  08/02/2023  . INFLUENZA VACCINE  Completed   Immunization History  Administered Date(s) Administered  . Influenza Inj Mdck Quad With Preservative 07/02/2017, 06/15/2018  . Influenza Split 06/28/2014, 06/28/2015  . Influenza,inj,quad, With Preservative 08/11/2016  . Influenza-Unspecified 09/01/2012  . PPD Test 06/28/2014, 08/11/2016, 10/12/2017, 11/01/2018  . Pneumococcal-Unspecified 09/01/1998  . Tdap 08/01/2013   Last Colon - 07/03/2009 - Dr Deatra Ina and recc 10 year f/u due Nov 2020.  Past Surgical History:  Procedure Laterality Date  . CLOSED REDUCTION NASAL FRACTURE N/A 06/10/2016   Procedure: CLOSED REDUCTION NASAL FRACTURE FOREHEAD SUTURE REMOVAL;  Surgeon: [name], DO;  Location: [facility];  Service: Plastics;  Laterality: N/A;  . COLONOSCOPY    . [place]  . Left knee Arthroscopy    . Right knee arthroscopy    . UPPER GASTROINTESTINAL ENDOSCOPY     last egd 12-7*2012   Family History  Problem Relation Age of Onset  . Arthritis Mother   . Lung cancer Mother   . Heart disease Mother   . Colon cancer Maternal Uncle   . Stroke Brother   . Prostate cancer Father   . Esophageal cancer Neg Hx   . Rectal cancer Neg Hx   . Stomach cancer Neg Hx   . Liver cancer Neg Hx   . Pancreatic cancer Neg Hx    Social History   Socioeconomic History  . Marital  status: Single    Spouse name: Malachy Mood  . Number of children: 1 son  Occupational History  . Occupation: Air cabin crew  Tobacco Use  . Smoking status: Former Smoker    Years: 6.00    Types: Cigarettes    Last attempt to quit: 02/07/2013    Years since quitting: 5.7  . Smokeless tobacco: Never Used  Substance and Sexual Activity  . Alcohol use: No    Alcohol/week: 0.0 standard drinks  .  Drug use: No  . Sexual activity: Yes    Partners: female    ROS Constitutional: Denies fever, chills, weight loss/gain, headaches, insomnia,  night sweats or change in appetite. Does c/o fatigue. Eyes: Denies redness, blurred vision, diplopia, discharge, itchy or watery eyes.  ENT: Denies discharge, congestion, post nasal drip, epistaxis, sore throat, earache, hearing loss, dental pain, Tinnitus, Vertigo, Sinus pain or snoring.  Cardio: Denies chest pain, palpitations, irregular heartbeat, syncope, dyspnea, diaphoresis, orthopnea, PND, claudication or edema Respiratory: denies cough, dyspnea, DOE, pleurisy, hoarseness, laryngitis or wheezing.  Gastrointestinal: Denies dysphagia, heartburn, reflux, water brash, pain, cramps, nausea, vomiting, bloating, diarrhea, constipation, hematemesis, melena, hematochezia, jaundice or hemorrhoids Genitourinary: Denies dysuria, frequency, discharge, hematuria or flank pain. Has urgency, nocturia x 2-3 & occasional hesitancy. Musculoskeletal: Denies arthralgia, myalgia, stiffness, Jt. Swelling, pain, limp or strain/sprain. Denies Falls. Skin: Denies puritis, rash, hives, warts, acne, eczema or change in skin lesion Neuro: No weakness, tremor, incoordination, spasms, paresthesia or pain Psychiatric: Denies confusion, memory loss or sensory loss. Denies Depression. Endocrine: Denies change in weight, skin, hair change, nocturia, and paresthesia, diabetic polys, visual blurring or hyper / hypo glycemic episodes.  Heme/Lymph: No excessive bleeding, bruising or enlarged  lymph nodes.  Physical Exam  BP  176/122   P 84   T 97.7 F   R 18   Ht 6' 0.5"   Wt  339 lb BMI 45.34 kg/m   Recheck BP 170/90  General Appearance: Well nourished and well groomed and in no apparent distress.  Eyes: PERRLA, EOMs, conjunctiva no swelling or erythema, normal fundi and vessels. Sinuses: No frontal/maxillary tenderness ENT/Mouth: EACs patent / TMs  nl. Nares clear without erythema, swelling, mucoid exudates. Oral hygiene is good. No erythema, swelling, or exudate. Tongue normal, non-obstructing. Tonsils not swollen or erythematous. Hearing normal.  Neck: Supple, thyroid not palpable. No bruits, nodes or JVD. Respiratory: Respiratory effort normal.  BS equal and clear bilateral without rales, rhonci, wheezing or stridor. Cardio: Heart sounds are normal with regular rate and rhythm and no murmurs, rubs or gallops. Peripheral pulses are normal and equal bilaterally without edema. No aortic or femoral bruits. Chest: symmetric with normal excursions and percussion.  Abdomen: Soft, with Nl bowel sounds. Nontender, no guarding, rebound, hernias, masses, or organomegaly.  Lymphatics: Non tender without lymphadenopathy.  Musculoskeletal: Full ROM all peripheral extremities, joint stability, 5/5 strength, and normal gait. Skin: Warm and dry without rashes, lesions, cyanosis, clubbing or  ecchymosis.  Neuro: Cranial nerves intact, reflexes equal bilaterally. Normal muscle tone, no cerebellar symptoms. Sensation intact.  Pysch: Alert and oriented X 3 with normal affect, insight and judgment appropriate.   Assessment and Plan  1. Annual Preventative/Screening Exam   2. Essential hypertension  - Add Amlodipine 10 mg qd & ROV 2 weeks for BP re-check  - EKG 12-Lead - [place], RETROPERITNL ABD,  LTD - Urinalysis, Routine w reflex microscopic - Microalbumin / creatinine urine ratio - CBC with Differential/Platelet - COMPLETE METABOLIC PANEL WITH GFR - Magnesium - TSH  3.  Hyperlipidemia, mixed  - EKG 12-Lead - [place], RETROPERITNL ABD,  LTD - Lipid panel - TSH  4. Abnormal glucose  - EKG 12-Lead - [place], RETROPERITNL ABD,  LTD - Hemoglobin A1c - Insulin, random  5. Vitamin D deficiency  - VITAMIN D 25 Hydroxyl  6. Class 3 severe obesity due to excess calories with serious comorbidity and body mass index (BMI) of 40.0 to 44.9 in adult (HCC)   7. Stage 3 CKD (GFR  56 ml/min)  - Urinalysis, Routine w reflex microscopic - Microalbumin / creatinine urine ratio - COMPLETE METABOLIC PANEL WITH GFR  8. Aorto-iliac atherosclerosis ([place])  - [place], RETROPERITNL ABD,  LTD  9. Testosterone deficiency  - Testosterone  10. FHx: heart disease  - EKG 12-Lead - [place], RETROPERITNL ABD,  LTD  11. Prediabetes  - Hemoglobin A1c - Insulin, random  12. Idiopathic gout  - Uric acid  13. 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 Prostate cancer screening  - PSA  16. Screening for ischemic heart disease  - EKG 12-Lead - [place], RETROPERITNL ABD,  LTD  17. Former smoker  - EKG 12-Lead - [place], RETROPERITNL ABD,  LTD  18. Screening for AAA (aortic abdominal aneurysm)  - [place], RETROPERITNL ABD,  LTD  19. Family history of ischemic heart disease  - EKG 12-Lead - [place], RETROPERITNL ABD,  LTD  20. Fatigue  - Iron,Total/Total Iron Binding Cap - Vitamin B12 - Testosterone - CBC with Differential/Platelet  21. Medication management  - Urinalysis, Routine w reflex microscopic - Microalbumin / creatinine urine ratio - Testosterone - CBC with Differential/Platelet - COMPLETE METABOLIC PANEL WITH GFR - Magnesium - Lipid panel - TSH - Hemoglobin A1c - Insulin, random - VITAMIN D 25 Hydroxyl         Patient was counseled in prudent diet, weight control to achieve/maintain BMI less than 25, BP monitoring, regular exercise and medications as discussed. Given Rx  Phentermine & Topamax for weight control. Discussed med effects and SE's. Routine screening labs and tests as requested with regular follow-up as recommended. Over 40 minutes of exam, counseling, chart review and high complex critical decision making was performed

## 2018-10-31 NOTE — Patient Instructions (Signed)

## 2018-11-01 ENCOUNTER — Encounter: Payer: Self-pay | Admitting: Internal Medicine

## 2018-11-01 ENCOUNTER — Ambulatory Visit: Payer: 59 | Admitting: Internal Medicine

## 2018-11-01 VITALS — BP 176/122 | HR 84 | Temp 97.7°F | Resp 18 | Ht 72.5 in | Wt 339.0 lb

## 2018-11-01 DIAGNOSIS — E349 Endocrine disorder, unspecified: Secondary | ICD-10-CM

## 2018-11-01 DIAGNOSIS — Z8249 Family history of ischemic heart disease and other diseases of the circulatory system: Secondary | ICD-10-CM

## 2018-11-01 DIAGNOSIS — Z87891 Personal history of nicotine dependence: Secondary | ICD-10-CM

## 2018-11-01 DIAGNOSIS — Z136 Encounter for screening for cardiovascular disorders: Secondary | ICD-10-CM | POA: Diagnosis not present

## 2018-11-01 DIAGNOSIS — M1000 Idiopathic gout, unspecified site: Secondary | ICD-10-CM

## 2018-11-01 DIAGNOSIS — N1831 Chronic kidney disease, stage 3a: Secondary | ICD-10-CM

## 2018-11-01 DIAGNOSIS — I708 Atherosclerosis of other arteries: Secondary | ICD-10-CM

## 2018-11-01 DIAGNOSIS — Z0000 Encounter for general adult medical examination without abnormal findings: Secondary | ICD-10-CM | POA: Diagnosis not present

## 2018-11-01 DIAGNOSIS — Z111 Encounter for screening for respiratory tuberculosis: Secondary | ICD-10-CM

## 2018-11-01 DIAGNOSIS — E559 Vitamin D deficiency, unspecified: Secondary | ICD-10-CM

## 2018-11-01 DIAGNOSIS — R7303 Prediabetes: Secondary | ICD-10-CM

## 2018-11-01 DIAGNOSIS — N138 Other obstructive and reflux uropathy: Secondary | ICD-10-CM

## 2018-11-01 DIAGNOSIS — N401 Enlarged prostate with lower urinary tract symptoms: Secondary | ICD-10-CM

## 2018-11-01 DIAGNOSIS — Z6841 Body Mass Index (BMI) 40.0 and over, adult: Secondary | ICD-10-CM

## 2018-11-01 DIAGNOSIS — I10 Essential (primary) hypertension: Secondary | ICD-10-CM | POA: Diagnosis not present

## 2018-11-01 DIAGNOSIS — N183 Chronic kidney disease, stage 3 (moderate): Secondary | ICD-10-CM

## 2018-11-01 DIAGNOSIS — Z79899 Other long term (current) drug therapy: Secondary | ICD-10-CM

## 2018-11-01 DIAGNOSIS — Z125 Encounter for screening for malignant neoplasm of prostate: Secondary | ICD-10-CM

## 2018-11-01 DIAGNOSIS — I700 Atherosclerosis of aorta: Secondary | ICD-10-CM

## 2018-11-01 DIAGNOSIS — R5383 Other fatigue: Secondary | ICD-10-CM

## 2018-11-01 DIAGNOSIS — E782 Mixed hyperlipidemia: Secondary | ICD-10-CM | POA: Diagnosis not present

## 2018-11-01 DIAGNOSIS — Z1211 Encounter for screening for malignant neoplasm of colon: Secondary | ICD-10-CM

## 2018-11-01 DIAGNOSIS — Z1212 Encounter for screening for malignant neoplasm of rectum: Secondary | ICD-10-CM

## 2018-11-01 DIAGNOSIS — R7309 Other abnormal glucose: Secondary | ICD-10-CM

## 2018-11-01 DIAGNOSIS — Z0001 Encounter for general adult medical examination with abnormal findings: Secondary | ICD-10-CM

## 2018-11-01 MED ORDER — PHENTERMINE HCL 37.5 MG PO TABS: ORAL_TABLET | ORAL | 5 refills | Status: DC

## 2018-11-01 MED ORDER — TOPIRAMATE 50 MG PO TABS: ORAL_TABLET | ORAL | 1 refills | Status: DC

## 2018-11-01 MED ORDER — AMLODIPINE BESYLATE 10 MG PO TABS: ORAL_TABLET | ORAL | 1 refills | Status: DC

## 2018-11-02 ENCOUNTER — Encounter: Payer: Self-pay | Admitting: *Deleted

## 2018-11-02 ENCOUNTER — Other Ambulatory Visit: Payer: Self-pay | Admitting: Internal Medicine

## 2018-11-02 LAB — COMPLETE METABOLIC PANEL WITH GFR
AG Ratio: 1.7 (calc) (ref 1.0–2.5)
ALKALINE PHOSPHATASE (APISO): 54 U/L (ref 35–144)
ALT: 18 U/L (ref 9–46)
AST: 19 U/L (ref 10–35)
Albumin: 4.5 g/dL (ref 3.6–5.1)
BUN: 16 mg/dL (ref 7–25)
CO2: 27 mmol/L (ref 20–32)
Calcium: 9.3 mg/dL (ref 8.6–10.3)
Chloride: 104 mmol/L (ref 98–110)
Creat: 1.17 mg/dL (ref 0.70–1.25)
GFR, Est African American: 78 mL/min/{1.73_m2} (ref >=60)
GFR, Est Non African American: 67 mL/min/{1.73_m2} (ref >=60)
Globulin: 2.6 g/dL (calc) (ref 1.9–3.7)
Glucose, Bld: 89 mg/dL (ref 65–99)
Potassium: 3.7 mmol/L (ref 3.5–5.3)
Sodium: 140 mmol/L (ref 135–146)
Total Bilirubin: 0.6 mg/dL (ref 0.2–1.2)
Total Protein: 7.1 g/dL (ref 6.1–8.1)

## 2018-11-02 LAB — CBC WITH DIFFERENTIAL/PLATELET
Absolute Monocytes: 866 cells/uL (ref 200–950)
Basophils Absolute: 28 cells/uL (ref 0–200)
Basophils Relative: 0.4 %
Eosinophils Absolute: 234 cells/uL (ref 15–500)
Eosinophils Relative: 3.3 %
HCT: 41.4 % (ref 38.5–50.0)
Hemoglobin: 14.5 g/dL (ref 13.2–17.1)
Lymphs Abs: 2151 cells/uL (ref 850–3900)
MCH: 30.9 pg (ref 27.0–33.0)
MCHC: 35 g/dL (ref 32.0–36.0)
MCV: 88.1 fL (ref 80.0–100.0)
MPV: 11.3 fL (ref 7.5–12.5)
Monocytes Relative: 12.2 %
Neutro Abs: 3820 cells/uL (ref 1500–7800)
Neutrophils Relative %: 53.8 %
Platelets: 152 10*3/uL (ref 140–400)
RBC: 4.7 10*6/uL (ref 4.20–5.80)
RDW: 13.3 % (ref 11.0–15.0)
Total Lymphocyte: 30.3 %
WBC: 7.1 10*3/uL (ref 3.8–10.8)

## 2018-11-02 LAB — MICROALBUMIN / CREATININE URINE RATIO
Creatinine, Urine: 118 mg/dL (ref 20–320)
Microalb Creat Ratio: 5 mcg/mg creat (ref <30)
Microalb, Ur: 0.6 mg/dL

## 2018-11-02 LAB — PSA: PSA: 0.4 ng/mL (ref <=4.0)

## 2018-11-02 LAB — HEMOGLOBIN A1C
HEMOGLOBIN A1C: 5.9 %{Hb} — AB (ref <5.7)
Mean Plasma Glucose: 123 (calc)
eAG (mmol/L): 6.8 (calc)

## 2018-11-02 LAB — LIPID PANEL
Cholesterol: 187 mg/dL (ref <200)
HDL: 40 mg/dL (ref >=40)
LDL Cholesterol (Calc): 125 mg/dL (calc) — ABNORMAL HIGH
Non-HDL Cholesterol (Calc): 147 mg/dL (calc) — ABNORMAL HIGH (ref <130)
Total CHOL/HDL Ratio: 4.7 (calc) (ref <5.0)
Triglycerides: 112 mg/dL (ref <150)

## 2018-11-02 LAB — VITAMIN D 25 HYDROXY (VIT D DEFICIENCY, FRACTURES): Vit D, 25-Hydroxy: 53 ng/mL (ref 30–100)

## 2018-11-02 LAB — INSULIN, RANDOM: Insulin: 16.4 u[IU]/mL

## 2018-11-02 LAB — MAGNESIUM: Magnesium: 1.7 mg/dL (ref 1.5–2.5)

## 2018-11-02 LAB — URINALYSIS, ROUTINE W REFLEX MICROSCOPIC
Bilirubin Urine: NEGATIVE
Glucose, UA: NEGATIVE
Hgb urine dipstick: NEGATIVE
Ketones, ur: NEGATIVE
Leukocytes,Ua: NEGATIVE
Nitrite: NEGATIVE
Protein, ur: NEGATIVE
Specific Gravity, Urine: 1.016 (ref 1.001–1.03)
pH: 6.5 (ref 5.0–8.0)

## 2018-11-02 LAB — IRON, TOTAL/TOTAL IRON BINDING CAP
%SAT: 35 % (calc) (ref 20–48)
Iron: 129 ug/dL (ref 50–180)
TIBC: 367 mcg/dL (calc) (ref 250–425)

## 2018-11-02 LAB — VITAMIN B12: VITAMIN B 12: 584 pg/mL (ref 200–1100)

## 2018-11-02 LAB — TSH: TSH: 2.09 mIU/L (ref 0.40–4.50)

## 2018-11-02 LAB — TESTOSTERONE: Testosterone: 290 ng/dL (ref 250–827)

## 2018-11-02 LAB — URIC ACID: Uric Acid, Serum: 9.6 mg/dL — ABNORMAL HIGH (ref 4.0–8.0)

## 2018-11-03 ENCOUNTER — Other Ambulatory Visit: Payer: Self-pay | Admitting: *Deleted

## 2018-11-03 DIAGNOSIS — M109 Gout, unspecified: Secondary | ICD-10-CM

## 2018-11-03 LAB — TB SKIN TEST
INDURATION: NEGATIVE mm
TB Skin Test: NEGATIVE

## 2018-11-03 MED ORDER — ALLOPURINOL 300 MG PO TABS: ORAL_TABLET | ORAL | 1 refills | Status: DC

## 2018-11-28 ENCOUNTER — Ambulatory Visit (HOSPITAL_COMMUNITY)
Admission: EM | Admit: 2018-11-28 | Discharge: 2018-11-28 | Disposition: A | Discharge status: Home / Self Care | Payer: 59 | Attending: Family Medicine | Admitting: Family Medicine

## 2018-11-28 DIAGNOSIS — J301 Allergic rhinitis due to pollen: Secondary | ICD-10-CM

## 2018-11-28 LAB — POCT RAPID STREP A: Streptococcus, Group A Screen (Direct): NEGATIVE

## 2018-11-28 MED ORDER — PREDNISONE 50 MG PO TABS: 50.0000 mg | ORAL_TABLET | Freq: Every day | ORAL | 0 refills | Status: AC

## 2018-11-28 MED ORDER — FLUTICASONE PROPIONATE 50 MCG/ACT NA SUSP: 1.0000 | Freq: Every day | NASAL | 0 refills | Status: DC

## 2018-11-28 NOTE — ED Provider Notes (Signed)
[facility]    CSN: [id number] Arrival date & time: 11/28/18  1142     History   Chief Complaint No chief complaint on file. Sore throat  HPI Christopher [name] is a 63 y.o. male history of allergic rhinitis, hypertension, hyperlipidemia, gout, GERD, presenting today for evaluation of sore throat and allergies.  Patient states that he believes he has been outside more normal and the pollen has triggered his allergies.  His main complaint is a sore throat and drainage.  He is also had nasal congestion and runny nose.  Denies cough, shortness of breath.  Denies fevers. Denies any recent travel, denies any known exposure to COVID-19.  He has tried taking Zyrtec a couple of times without relief.  States that prednisone typically helps calm down his symptoms.  HPI  Past Medical History:  Diagnosis Date  . Allergy   . Anxiety   . Asthma   . Barrett's esophagus   . GERD (gastroesophageal reflux disease)   . Gout   . H/O hiatal hernia   . Hyperlipidemia   . Hypertension   . Prediabetes   . Sleep apnea   . Vitamin D deficiency     Patient Active Problem List   Diagnosis Date Noted  . Abnormal glucose 10/31/2018  . Class 3 severe obesity due to excess calories with serious comorbidity and body mass index (BMI) of 40.0 to 44.9 in adult ([place]) 10/31/2018  . FHx: heart disease 04/26/2018  . Former smoker 04/26/2018  . Sleep disorder 04/21/2018  . Depression with anxiety 01/15/2018  . Aorto-iliac atherosclerosis ([place]) 01/10/2018  . Epistaxis, recurrent 09/19/2016  . Deviated septum 08/18/2016  . Nasal turbinate hypertrophy 08/18/2016  . Closed fracture of nasal septum 06/02/2016  . Stenosing tenosynovitis of thumb 07/06/2015  . Internal hemorrhoids 03/09/2015  . Testosterone deficiency 04/18/2014  . Medication management 01/12/2014  . Morbid obesity ([place]) 01/12/2014  . CKD (chronic kidney disease) stage 3, GFR 30-59 ml/min (HCC) 01/12/2014  . Prediabetes   . Idiopathic  gout   . Vitamin D deficiency   . High blood pressure 08/17/2012  . Hyperlipidemia, mixed 08/17/2012  . History of tobacco abuse 08/17/2012  . GERD 08/17/2012    Past Surgical History:  Procedure Laterality Date  . CLOSED REDUCTION NASAL FRACTURE N/A 06/10/2016   Procedure: CLOSED REDUCTION NASAL FRACTURE FOREHEAD SUTURE REMOVAL;  Surgeon: [name], DO;  Location: [facility];  Service: Plastics;  Laterality: N/A;  . COLONOSCOPY    . [place]  . Left knee Arthroscopy    . Right knee arthroscopy    . UPPER GASTROINTESTINAL ENDOSCOPY     last egd 12-7*2012       Home Medications    Prior to Admission medications   Medication Sig Start Date End Date Taking? 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    [provider]  atenolol (TENORMIN) 100 MG tablet Take 1 tablet daily for BP 07/27/18   McClanahan, Kyra, NP  buPROPion (WELLBUTRIN XL) 300 MG 24 hr tablet TAKE 1 TABLET BY MOUTH  EVERY MORNING 07/06/18   Liane Comber, NP  Cyanocobalamin (VITAMIN B 12 PO) Take 1 tablet by mouth daily.      [provider]  cyclobenzaprine (FLEXERIL) 10 MG tablet Take at bedtime for muscle spasm - Do not take with Alprazolam / Xanax 04/05/18   Unk Pinto, MD  esomeprazole (NEXIUM) 40 MG capsule Take 1 capsule 2 x / day for Barrett's Esophagus 10/12/17 10/12/18  Unk Pinto, MD  fenofibrate 160 MG tablet TAKE 1 TABLET BY MOUTH   DAILY 01/28/18   Unk Pinto, MD  fish oil-omega-3 fatty acids 1000 MG capsule Take 1 g by mouth daily.     [provider]  Flaxseed, Linseed, (FLAX SEEDS PO) Take by mouth.    [provider]  fluticasone (FLONASE) 50 MCG/ACT nasal spray Place 1-2 sprays into both nostrils daily. 11/28/18   Kiaraliz Rafuse C, PA-C  furosemide (LASIX) 40 MG tablet Take 1 tablet (40 mg total) by mouth 2 (two) times daily. 06/15/18 06/15/19  Unk Pinto, MD  magnesium oxide (MAG-OX) 400 MG tablet Take 400 mg by mouth 2 (two) times daily.     [provider]  Multiple Vitamin (MULTIVITAMIN WITH MINERALS) TABS tablet Take 1 tablet by mouth daily.    [provider]  phentermine (ADIPEX-P) 37.5 MG tablet Take 1/2 to 1 tablet every morning for Dieting & Weight Loss 11/01/18   Unk Pinto, MD  potassium chloride (K-DUR,KLOR-CON) 10 MEQ tablet TAKE 1 TABLET BY MOUTH 3  TIMES DAILY 10/09/17   Unk Pinto, MD  predniSONE (DELTASONE) 50 MG tablet Take 1 tablet (50 mg total) by mouth daily for 5 days. 11/28/18 12/03/18  Abbigal Radich C, PA-C  TESTIM 50 MG/5GM (1%) GEL APPLY CONTENTS OF 1 TUBE  TOPICALLY DAILY 04/05/18   Unk Pinto, MD  topiramate (TOPAMAX) 50 MG tablet Take 1/2 to 1  tablet 2 x /day at Suppertime & Bedtime for Dieting & Weight Loss 11/01/18   Unk Pinto, MD  valsartan (DIOVAN) 320 MG tablet Take 1 tablet every morning for BP 07/27/18   McClanahan, Danton Sewer, NP  vitamin C (ASCORBIC ACID) 500 MG tablet Take 500 mg by mouth daily.     [provider]  Vitamin D, Ergocalciferol, (DRISDOL) 1.25 MG (50000 UT) CAPS capsule TAKE 1 CAPSULE BY MOUTH  DAILY 09/28/18   Unk Pinto, MD    Family History Family History  Problem Relation Age of Onset  . Arthritis Mother   . Lung cancer Mother   . Heart disease Mother   . Colon cancer Maternal Uncle   . Stroke Brother   . Prostate cancer Father   . Esophageal cancer Neg Hx   . Rectal cancer Neg Hx    . Stomach cancer Neg Hx   . Liver cancer Neg Hx   . Pancreatic cancer Neg Hx     Social History Social History   Tobacco Use  . Smoking status: Former Smoker    Years: 6.00    Types: Cigarettes    Last attempt to quit: 02/07/2013    Years since quitting: 5.8  . Smokeless tobacco: Never Used  Substance Use Topics  . Alcohol use: No    Alcohol/week: 0.0 standard drinks  . Drug use: No     Allergies   Patient  has no known allergies.   Review of Systems Review of Systems  Constitutional: Negative for activity change, appetite change, chills, fatigue and fever.  HENT: Positive for congestion, rhinorrhea and sore throat. Negative for ear pain, sinus pressure and trouble swallowing.   Eyes: Positive for itching. Negative for discharge and redness.  Respiratory: Negative for cough, chest tightness and shortness of breath.   Cardiovascular: Negative for chest pain.  Gastrointestinal: Negative for abdominal pain, diarrhea, nausea and vomiting.  Musculoskeletal: Negative for myalgias.  Skin: Negative for rash.  Neurological: Negative for dizziness, light-headedness and headaches.     Physical Exam Triage Vital Signs ED Triage Vitals [11/28/18 1226]  Enc Vitals Group     BP 121/87     Pulse Rate 63     Resp 18     Temp 98.7 F (37.1 C)     Temp Source Oral     SpO2 94 %     Weight      Height      Head Circumference      Peak Flow      Pain Score      Pain Loc      Pain Edu?      Excl. in [place]?    No data found.  Updated Vital Signs BP 121/87 (BP Location: Left Arm)   Pulse 63   Temp 98.7 F (37.1 C) (Oral)   Resp 18   SpO2 94% Comment: reported oxygen level to Dr Lysle Morales  Visual Acuity Right Eye Distance:   Left Eye Distance:   Bilateral Distance:    Right Eye Near:   Left Eye Near:    Bilateral Near:     Physical Exam Vitals signs and nursing note reviewed.  Constitutional:      Appearance: He is well-developed.  HENT:     Head: Normocephalic  and atraumatic.     Ears:     Comments: Bilateral ears without tenderness to palpation of external auricle, tragus and mastoid, EAC's without erythema or swelling, TM's with good bony landmarks and cone of light. Non erythematous.     Nose:     Comments: Is mucosa pink, mildly swollen turbinates    Mouth/Throat:     Comments: Oral mucosa pink and moist, no tonsillar enlargement or exudate. Posterior pharynx patent and erythematous, no uvula deviation or swelling. Normal phonation. Eyes:     Conjunctiva/sclera: Conjunctivae normal.  Neck:     Musculoskeletal: Neck supple.  Cardiovascular:     Rate and Rhythm: Normal rate and regular rhythm.     Heart sounds: No murmur.  Pulmonary:     Effort: Pulmonary effort is normal. No respiratory distress.     Breath sounds: Normal breath sounds.     Comments: Breathing comfortably at rest, CTABL, no wheezing, rales or other adventitious sounds auscultated Abdominal:     Palpations: Abdomen is soft.     Tenderness: There is no abdominal tenderness.  Skin:    General: Skin is warm and dry.  Neurological:     Mental Status: He is alert.      UC Treatments / Results  Labs (all labs ordered are listed, but only abnormal results are displayed) Labs Reviewed  CULTURE, GROUP A STREP [facility])    EKG None  Radiology No results found.  Procedures Procedures (including critical care time)  Medications Ordered in UC Medications - No data to display  Initial Impression / Assessment and Plan / UC Course  I have reviewed  the triage vital signs and the nursing notes.  Pertinent labs & imaging results that were available during my care of the patient were reviewed by me and considered in my medical decision making (see chart for details).     Vital signs stable, exam nonfocal, symptoms most likely allergic rhinitis.  Strep test negative.  Will recommend continued use of daily antihistamine, add in Flonase.  Discussed risks of prednisone  currently with COVID-19.  Patient opted to still proceed with prednisone.  Continue to monitor, Discussed strict return precautions. Patient verbalized understanding and is agreeable with plan.  Final Clinical Impressions(s) / UC Diagnoses   Final diagnoses:  Allergic rhinitis due to pollen, unspecified seasonality     Discharge Instructions     Please continue daily antihistamine like Zyrtec, may try Claritin as an alternative or Claritin-D Add in [place] your Singulair at bedtime Begin prednisone daily for the next 5 days with food on stomach  Please continue to monitor, follow-up if symptoms worsening, developing fevers, cough, chest pain, shortness of breath, difficulty breathing    ED Prescriptions    Medication Sig Dispense Auth. Provider   predniSONE (DELTASONE) 50 MG tablet Take 1 tablet (50 mg total) by mouth daily for 5 days. 5 tablet Afia Messenger C, PA-C   fluticasone (FLONASE) 50 MCG/ACT nasal spray Place 1-2 sprays into both nostrils daily. 16 g Kimm Ungaro, [place] C, PA-C     Controlled Substance Prescriptions [place] Controlled Substance Registry consulted? Not Applicable   Janith [place], [place] 11/28/18 1324

## 2018-11-28 NOTE — Discharge Instructions (Addendum)
Please continue daily antihistamine like Zyrtec, may try Claritin as an alternative or Claritin-D Add in [place] your Singulair at bedtime Begin prednisone daily for the next 5 days with food on stomach  Please continue to monitor, follow-up if symptoms worsening, developing fevers, cough, chest pain, shortness of breath, difficulty breathing

## 2018-11-30 LAB — CULTURE, GROUP A STREP (THRC)

## 2019-02-17 NOTE — Progress Notes (Deleted)
FOLLOW UP  Assessment and Plan:   Hypertension Well controlled with current medications  Monitor blood pressure at home; patient to call if consistently greater than 130/80 Continue DASH diet.   Reminder to go to the ER if any CP, SOB, nausea, dizziness, severe HA, changes vision/speech, left arm numbness and tingling and jaw pain.  Cholesterol Currently borderline elevated; continue fenofibrate and diet; if LDL progresses further will initiate statin Continue low cholesterol diet and exercise.  Check lipid panel.   Prediabetes Continue diet and exercise.  Perform daily foot/skin check, notify office of any concerning changes.  Check A1C  Morbid obesity Long discussion about weight loss, diet, and exercise Recommended diet heavy in fruits and veggies and low in animal meats, cheeses, and dairy products, appropriate calorie intake Discussed ideal weight for height  Will follow up in 3 months  Vitamin D Def Near goal at last visit; continue supplementation to maintain goal of 60-100 Defer Vit D level  Gout Continue allopurinol Diet discussed Check uric acid as needed  Hypogonadism - continue to monitor, states medication is helping with symptoms of low T.   Depression/Anxiety Well managed by current regimen; continue medications - reminded to limit excessive benzo use, avoid daily use when possible Stress management techniques discussed, increase water, good sleep hygiene discussed, increase exercise, and increase veggies.    Continue diet and meds as discussed. Further disposition pending results of labs. Discussed med's effects and SE's.   Over 30 minutes of exam, counseling, chart review, and critical decision making was performed.   Future Appointments  Date Time Provider [place]  02/21/2019  8:45 AM Liane Comber, NP [facility] None  05/30/2019  9:30 AM Unk Pinto, MD [facility] None  11/21/2019  2:00 PM Unk Pinto, MD [facility] None     ----------------------------------------------------------------------------------------------------------------------  HPI 63 y.o. male  presents for 3 month follow up on hypertension, cholesterol, diabetes, morbid obesity, hypogonadism, gout, depression/anxiety and vitamin D deficiency. He has GERD with Barret's Esophagus and remains on nexium 40 mg BID due to this with symptoms well controlled.   he has a diagnosis of depression/anxiety and is currently on wellbutrin daily and xanax 0.5-1 mg TID PRN, reports symptoms are well controlled on current regimen. he currently takes xanax 1 mg daily on most days.   he is prescribed phentermine and topamax for weight loss.  While on the medication they have lost {NUMBERS [id number]:[id number]} lbs since last visit. They deny palpitations, anxiety, trouble sleeping, elevated BP.   BMI is There is no height or weight on file to calculate BMI., he is working on diet and exercise. Wt Readings from Last 3 Encounters:  11/01/18 (!) 339 lb (153.8 kg)  07/27/18 (!) 329 lb 3.2 oz (149.3 kg)  06/15/18 (!) 322 lb 3.2 oz (146.1 kg)   Typical breakfast: Typical lunch:  Typical dinner: Exercise:  Water intake:    His blood pressure has been controlled at home, today their BP is    He does workout. He denies chest pain, shortness of breath, dizziness.   He is on cholesterol medication (fenofibrate, omega 3, had cramping with crestor) and denies myalgias. His cholesterol is not at goal. The cholesterol last visit was:   Lab Results  Component Value Date   CHOL 187 11/01/2018   HDL 40 11/01/2018   LDLCALC 125 (H) 11/01/2018   TRIG 112 11/01/2018   CHOLHDL 4.7 11/01/2018    He has been working on diet and exercise for prediabetes, and denies foot ulcerations,  increased appetite, nausea, paresthesia of the feet, polydipsia, polyuria, visual disturbances, vomiting and weight loss. Last A1C in the office was:  Lab Results  Component Value Date   HGBA1C 5.9 (H)  11/01/2018   Patient is on Vitamin D supplement and near goal of 60-100 at recent check:    Lab Results  Component Value Date   VD25OH 53 11/01/2018     Patient is on allopurinol for gout and does not report a recent flare.  Lab Results  Component Value Date   LABURIC 9.6 (H) 11/01/2018   He has a history of testosterone deficiency and is on testosterone replacement (gel). He states that the testosterone helps with his energy, libido, muscle mass. Lab Results  Component Value Date   TESTOSTERONE 290 11/01/2018      Current Medications:  Current Outpatient Medications on File Prior to Visit  Medication Sig  . 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 allopurinol (ZYLOPRIM) 300 MG tablet TAKE 1 TABLET BY MOUTH  DAILY FOR GOUT PREVENTION  . ALPRAZolam (XANAX) 1 MG tablet Take 1/2-1 tablet 2 - 3 x /day ONLY if needed for Anxiety Attack &  limit to 5 days /week to avoid addiction  . amLODipine (NORVASC) 10 MG tablet Take 1 tablet daily for BP  . aspirin EC 81 MG tablet Take 81 mg by mouth daily.  Marland Kitchen atenolol (TENORMIN) 100 MG tablet Take 1 tablet daily for BP  . buPROPion (WELLBUTRIN XL) 300 MG 24 hr tablet TAKE 1 TABLET BY MOUTH  EVERY MORNING  . Cyanocobalamin (VITAMIN B 12 PO) Take 1 tablet by mouth daily.    . cyclobenzaprine (FLEXERIL) 10 MG tablet Take at bedtime for muscle spasm - Do not take with Alprazolam / Xanax  . esomeprazole (NEXIUM) 40 MG capsule Take 1 capsule 2 x / day for Barrett's Esophagus  . fenofibrate 160 MG tablet TAKE 1 TABLET BY MOUTH  DAILY  . fish oil-omega-3 fatty acids 1000 MG capsule Take 1 g by mouth daily.   . Flaxseed, Linseed, (FLAX SEEDS PO) Take by mouth.  . fluticasone (FLONASE) 50 MCG/ACT nasal spray Place 1-2 sprays into both nostrils daily.  . furosemide (LASIX) 40 MG tablet Take 1 tablet (40 mg total) by mouth 2 (two) times daily.  . magnesium oxide (MAG-OX) 400 MG tablet Take 400 mg by mouth 2 (two) times  daily.   . Multiple Vitamin (MULTIVITAMIN WITH MINERALS) TABS tablet Take 1 tablet by mouth daily.  . phentermine (ADIPEX-P) 37.5 MG tablet Take 1/2 to 1 tablet every morning for Dieting & Weight Loss  . potassium chloride (K-DUR,KLOR-CON) 10 MEQ tablet TAKE 1 TABLET BY MOUTH 3  TIMES DAILY  . TESTIM 50 MG/5GM (1%) GEL APPLY CONTENTS OF 1 TUBE  TOPICALLY DAILY  . topiramate (TOPAMAX) 50 MG tablet Take 1/2 to 1  tablet 2 x /day at Suppertime & Bedtime for Dieting & Weight Loss  . valsartan (DIOVAN) 320 MG tablet Take 1 tablet every morning for BP  . vitamin C (ASCORBIC ACID) 500 MG tablet Take 500 mg by mouth daily.   . Vitamin D, Ergocalciferol, (DRISDOL) 1.25 MG (50000 UT) CAPS capsule TAKE 1 CAPSULE BY MOUTH  DAILY   No current facility-administered medications on file prior to visit.      Allergies: No Known Allergies   Medical History:  Past Medical History:  Diagnosis Date  . Allergy   . Anxiety   . Asthma   .  Barrett's esophagus   . GERD (gastroesophageal reflux disease)   . Gout   . H/O hiatal hernia   . Hyperlipidemia   . Hypertension   . Prediabetes   . Sleep apnea   . Vitamin D deficiency    Family history- Reviewed and unchanged Social history- Reviewed and unchanged   Review of Systems:  Review of Systems  Constitutional: Negative for malaise/fatigue and weight loss.  HENT: Negative for hearing loss and tinnitus.   Eyes: Negative for blurred vision and double vision.  Respiratory: Negative for cough, shortness of breath and wheezing.   Cardiovascular: Negative for chest pain, palpitations, orthopnea, claudication and leg swelling.  Gastrointestinal: Negative for abdominal pain, blood in stool, constipation, diarrhea, heartburn, melena, nausea and vomiting.  Genitourinary: Negative.   Musculoskeletal: Negative for back pain, falls, joint pain and myalgias.  Skin: Negative for rash.  Neurological: Negative for dizziness, tingling, sensory change, weakness and  headaches.  Endo/Heme/Allergies: Negative for polydipsia.  Psychiatric/Behavioral: Negative.   All other systems reviewed and are negative.   Physical Exam: There were no vitals taken for this visit. Wt Readings from Last 3 Encounters:  11/01/18 (!) 339 lb (153.8 kg)  07/27/18 (!) 329 lb 3.2 oz (149.3 kg)  06/15/18 (!) 322 lb 3.2 oz (146.1 kg)   General Appearance: Well nourished, in no apparent distress. Eyes: PERRLA, EOMs, conjunctiva no swelling or erythema Sinuses: No Frontal/maxillary tenderness ENT/Mouth: Ext aud canals clear, TMs without erythema, bulging. No erythema, swelling, or exudate on post pharynx.  Tonsils not swollen or erythematous. Hearing normal.  Neck: Supple, thyroid normal.  Respiratory: Respiratory effort normal, BS equal bilaterally without rales, rhonchi, wheezing or stridor.  Cardio: RRR with no MRGs. Brisk peripheral pulses without edema.  Abdomen: Soft, + BS.  Non tender, no guarding, rebound, hernias, masses. Lymphatics: Non tender without lymphadenopathy.  Musculoskeletal: Full ROM, 5/5 strength, Normal gait Skin: Warm, dry without rashes, lesions, ecchymosis.  Neuro: Cranial nerves intact. No cerebellar symptoms.  Psych: Awake and oriented X 3, normal affect, Insight and Judgment appropriate.   Izora Ribas, NP 4:47 PM [facility]

## 2019-02-21 ENCOUNTER — Ambulatory Visit: Payer: Self-pay | Admitting: Adult Health

## 2019-03-09 ENCOUNTER — Other Ambulatory Visit: Payer: Self-pay | Admitting: Internal Medicine

## 2019-03-09 DIAGNOSIS — R450 Nervousness: Secondary | ICD-10-CM

## 2019-03-09 DIAGNOSIS — K219 Gastro-esophageal reflux disease without esophagitis: Secondary | ICD-10-CM

## 2019-03-10 ENCOUNTER — Other Ambulatory Visit: Payer: Self-pay | Admitting: Internal Medicine

## 2019-03-10 DIAGNOSIS — R450 Nervousness: Secondary | ICD-10-CM

## 2019-03-10 MED ORDER — ALPRAZOLAM 1 MG PO TABS: ORAL_TABLET | ORAL | 0 refills | Status: DC

## 2019-03-18 ENCOUNTER — Other Ambulatory Visit: Payer: Self-pay | Admitting: Internal Medicine

## 2019-03-18 DIAGNOSIS — I10 Essential (primary) hypertension: Secondary | ICD-10-CM

## 2019-03-18 MED ORDER — VALSARTAN 320 MG PO TABS: ORAL_TABLET | ORAL | 3 refills | Status: DC

## 2019-03-18 MED ORDER — FUROSEMIDE 40 MG PO TABS: ORAL_TABLET | ORAL | 3 refills | Status: DC

## 2019-03-18 MED ORDER — AMLODIPINE BESYLATE 10 MG PO TABS: ORAL_TABLET | ORAL | 3 refills | Status: DC

## 2019-03-25 ENCOUNTER — Encounter: Payer: Self-pay | Admitting: Gastroenterology

## 2019-04-26 ENCOUNTER — Telehealth: Payer: Self-pay | Admitting: Gastroenterology

## 2019-04-26 NOTE — Telephone Encounter (Signed)
Left message for the patient to call back.

## 2019-04-27 ENCOUNTER — Other Ambulatory Visit: Payer: Self-pay | Admitting: Internal Medicine

## 2019-04-27 NOTE — Telephone Encounter (Signed)
Spoke to the patient who wanted an endi/colon in the morning. He did not want to be scheduled at any other time. This RN told the patient that I would try to get him scheduled during the time slot that he requested but may be unable to do so. Patient verbalized understanding. Will continue to review the schedule to attempt to schedule the patient.

## 2019-04-29 ENCOUNTER — Encounter: Payer: Self-pay | Admitting: *Deleted

## 2019-04-29 NOTE — Telephone Encounter (Signed)
Patient scheduled for the following:   05/23/2019 at 8:00 am PV with RN  06/06/2019 at 8:00 am endo colon  Letter sent.

## 2019-05-02 ENCOUNTER — Other Ambulatory Visit: Payer: Self-pay | Admitting: Adult Health

## 2019-05-02 ENCOUNTER — Other Ambulatory Visit: Payer: Self-pay | Admitting: Internal Medicine

## 2019-05-02 DIAGNOSIS — M109 Gout, unspecified: Secondary | ICD-10-CM

## 2019-05-11 ENCOUNTER — Other Ambulatory Visit: Payer: Self-pay | Admitting: Adult Health Nurse Practitioner

## 2019-05-11 DIAGNOSIS — I10 Essential (primary) hypertension: Secondary | ICD-10-CM

## 2019-05-12 ENCOUNTER — Other Ambulatory Visit: Payer: Self-pay | Admitting: *Deleted

## 2019-05-12 MED ORDER — POTASSIUM CHLORIDE CRYS ER 10 MEQ PO TBCR: 10.0000 meq | EXTENDED_RELEASE_TABLET | Freq: Three times a day (TID) | ORAL | 1 refills | Status: DC

## 2019-05-23 ENCOUNTER — Ambulatory Visit (AMBULATORY_SURGERY_CENTER): Payer: Self-pay | Admitting: *Deleted

## 2019-05-23 ENCOUNTER — Other Ambulatory Visit: Payer: Self-pay

## 2019-05-23 ENCOUNTER — Telehealth: Payer: Self-pay | Admitting: Gastroenterology

## 2019-05-23 VITALS — Temp 97.3°F | Ht 72.5 in | Wt 326.0 lb

## 2019-05-23 DIAGNOSIS — K22719 Barrett's esophagus with dysplasia, unspecified: Secondary | ICD-10-CM

## 2019-05-23 DIAGNOSIS — Z1211 Encounter for screening for malignant neoplasm of colon: Secondary | ICD-10-CM

## 2019-05-23 MED ORDER — NA SULFATE-K SULFATE-MG SULF 17.5-3.13-1.6 GM/177ML PO SOLN: 1.0000 | Freq: Once | ORAL | 0 refills | Status: AC

## 2019-05-23 MED ORDER — PEG 3350-KCL-NA BICARB-NACL 420 G PO SOLR: 4000.0000 mL | Freq: Once | ORAL | 0 refills | Status: AC

## 2019-05-23 NOTE — Progress Notes (Signed)

## 2019-05-23 NOTE — Telephone Encounter (Signed)
Pt states that Suprep is $80 but coupon would only take off $6- Pharmacy told pt would cover Golytely and this is what he wishes to have called in- Explained to pt I will send in the Script of Golytely, he will need to get Dulcolax 5 mg laxatives tabs x 4 and I will My Chart and Mail the  instructions- pt verified address --pt to call with any questions once he receives the new instructions

## 2019-05-23 NOTE — Telephone Encounter (Signed)
Pt reported that insurance prefers PEG soln.

## 2019-05-24 ENCOUNTER — Encounter: Payer: Self-pay | Admitting: Gastroenterology

## 2019-05-29 ENCOUNTER — Encounter: Payer: Self-pay | Admitting: Internal Medicine

## 2019-05-29 NOTE — Patient Instructions (Signed)

## 2019-05-29 NOTE — Progress Notes (Signed)
History of Present Illness:      This very nice 63 y.o. [name] presents for 6 month follow up with HTN, HLD, Pre-Diabetes and Vitamin D Deficiency. Patient has GERD (Barrett's)  & Gout  - both controlled on his meds.      Patient is treated for HTN (2005) & BP has been controlled  In the range 130-140/80-85 at home. Today's BP was elevated at 140/98. Patient has had no complaints of any cardiac type chest pain, palpitations, dyspnea / orthopnea / PND, dizziness, claudication, or dependent edema.      Hyperlipidemia is not controlled with diet & meds. Patient denies myalgias or other med SE's. Last Lipids were not at goal: Lab Results  Component Value Date   CHOL 187 11/01/2018   HDL 40 11/01/2018   LDLCALC 125 (H) 11/01/2018   TRIG 112 11/01/2018   CHOLHDL 4.7 11/01/2018       Also, the patient admits Gluttony/Compulsive Overeating with Morbid Obesity (BMI 43+)  (he has lost 13# in the last 6 months) and has history of PreDiabetes and has had no symptoms of reactive hypoglycemia, diabetic polys, paresthesias or visual blurring.  Last A1c was not at goal: Lab Results  Component Value Date   HGBA1C 5.9 (H) 11/01/2018       Patient has hx/o Low T ("228" in 2001) and had declined replacement therapy.      Further, the patient also has history of Vitamin D Deficiency("17" / 2008) and supplements vitamin D without any suspected side-effects. Last vitamin D was near goal (70-100):  Lab Results  Component Value Date   VD25OH 53 11/01/2018   Current Outpatient Medications on File Prior to Visit  Medication Sig  . 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 allopurinol (ZYLOPRIM) 300 MG tablet Take 1 tablet Daily to Prevent Gout  . ALPRAZolam (XANAX) 1 MG tablet Take 1/2-1 tablet 2 - 3 x /day ONLY if needed for Anxiety Attack &  limit to 5 days /week to avoid addiction  . amLODipine (NORVASC) 10 MG tablet Take 1 tablet every night for BP  . aspirin EC 81  MG tablet Take 81 mg by mouth daily.  Marland Kitchen atenolol (TENORMIN) 100 MG tablet TAKE 1 TABLET BY MOUTH  DAILY FOR BLOOD PRESSURE  . buPROPion (WELLBUTRIN XL) 300 MG 24 hr tablet Take 1 table tevery Morning for Mood, Focus & Concentration  . Cyanocobalamin (VITAMIN B 12 PO) Take 1 tablet by mouth daily.    Marland Kitchen esomeprazole (NEXIUM) 40 MG capsule Take 1 capsule 2 x /day for GERD & Barrett's Esophagus  . fenofibrate 160 MG tablet Take 1 tablet Daily for Triglycerides  . fish oil-omega-3 fatty acids 1000 MG capsule Take 1 g by mouth daily.   . Flaxseed, Linseed, (FLAX SEEDS PO) Take by mouth.  . furosemide (LASIX) 40 MG tablet Take 1 tablet 2 x/day for BP & Fluid  . magnesium oxide (MAG-OX) 400 MG tablet Take 400 mg by mouth 2 (two) times daily.   . methocarbamol (ROBAXIN) 500 MG tablet Take 1 tablet at bedtime for Muscle Spasm.  . Multiple Vitamin (MULTIVITAMIN WITH MINERALS) TABS tablet Take 1 tablet by mouth daily.  . phentermine (ADIPEX-P) 37.5 MG tablet Take 1/2 to 1 tablet every morning for Dieting & Weight Loss (Patient not taking: Reported on 05/23/2019)  . tadalafil (CIALIS) 20 MG tablet Take 1/2 to 1 tablet every 2 to 3  days as needed for XXXX  . TESTIM 50 MG/5GM (1%) GEL APPLY CONTENTS OF 1 TUBE  TOPICALLY DAILY  . valsartan (DIOVAN) 320 MG tablet Take 1 tablet every morning for BP  . vitamin C (ASCORBIC ACID) 500 MG tablet Take 500 mg by mouth daily.   . Vitamin D, Ergocalciferol, (DRISDOL) 1.25 MG (50000 UT) CAPS capsule Take 1 capsule Daily for severe Vit D  Deficiency   No current facility-administered medications on file prior to visit.     No Active Allergies  PMHx:   Past Medical History:  Diagnosis Date  . Allergy   . Anxiety   . Asthma    as a child  . Barrett's esophagus   . GERD (gastroesophageal reflux disease)   . Gout    never had a flare up of gout  . H/O hiatal hernia   . Hyperlipidemia   . Hypertension   . Prediabetes   . Sleep apnea    does not use  cpap  .  Vitamin D deficiency    Immunization History  Administered Date(s) Administered  . Influenza Inj Mdck Quad With Preservative 07/02/2017, 06/15/2018  . Influenza Split 06/28/2014, 06/28/2015  . Influenza,inj,quad, With Preservative 08/11/2016  . Influenza-Unspecified 09/01/2012  . PPD Test 06/28/2014, 08/11/2016, 10/12/2017, 11/01/2018  . Pneumococcal-Unspecified 09/01/1998  . Tdap 08/01/2013   Past Surgical History:  Procedure Laterality Date  . CLOSED REDUCTION NASAL FRACTURE N/A 06/10/2016   Procedure: CLOSED REDUCTION NASAL FRACTURE FOREHEAD SUTURE REMOVAL;  Surgeon: [name], DO;  Location: [facility];  Service: Plastics;  Laterality: N/A;  . COLONOSCOPY    . [place]  . Left knee Arthroscopy    . Right knee arthroscopy    . UPPER GASTROINTESTINAL ENDOSCOPY     last egd 12-7*2012    FHx:    Reviewed / unchanged  SHx:    Reviewed / unchanged   Systems Review:  Constitutional: Denies fever, chills, wt changes, headaches, insomnia, fatigue, night sweats, change in appetite. Eyes: Denies redness, blurred vision, diplopia, discharge, itchy, watery eyes.  ENT: Denies discharge, congestion, post nasal drip, epistaxis, sore throat, earache, hearing loss, dental pain, tinnitus, vertigo, sinus pain, snoring.  CV: Denies chest pain, palpitations, irregular heartbeat, syncope, dyspnea, diaphoresis, orthopnea, PND, claudication or edema. Respiratory: denies cough, dyspnea, DOE, pleurisy, hoarseness, laryngitis, wheezing.  Gastrointestinal: Denies dysphagia, odynophagia, heartburn, reflux, water brash, abdominal pain or cramps, nausea, vomiting, bloating, diarrhea, constipation, hematemesis, melena, hematochezia  or hemorrhoids. Genitourinary: Denies dysuria, frequency, urgency, nocturia, hesitancy, discharge, hematuria or flank pain. Musculoskeletal: Denies arthralgias, myalgias, stiffness, jt. swelling, pain, limping or strain/sprain.  Skin:  Denies pruritus, rash, hives, warts, acne, eczema or change in skin lesion(s). Neuro: No weakness, tremor, incoordination, spasms, paresthesia or pain. Psychiatric: Denies confusion, memory loss or sensory loss. Endo: Denies change in weight, skin or hair change.  Heme/Lymph: No excessive bleeding, bruising or enlarged lymph nodes.  Physical Exam  BP (!) 140/98   Pulse 64   Temp (!) 97.5 F (36.4 C)   Resp 16   Ht 6' 0.5" (1.842 m)   Wt (!) 326 lb 3.2 oz (148 kg)   BMI 43.63 kg/m   Appears  well nourished, well groomed  and in no distress.  Eyes: PERRLA, EOMs, conjunctiva no swelling or erythema. Sinuses: No frontal/maxillary tenderness ENT/Mouth: EAC's clear, TM's nl w/o erythema, bulging. Nares clear w/o erythema, swelling, exudates. Oropharynx clear without erythema or exudates. Oral hygiene is good. Tongue normal, non  obstructing. Hearing intact.  Neck: Supple. Thyroid not palpable. Car 2+/2+ without bruits, nodes or JVD. Chest: Respirations nl with BS clear & equal w/o rales, rhonchi, wheezing or stridor.  Cor: Heart sounds normal w/ regular rate and rhythm without sig. murmurs, gallops, clicks or rubs. Peripheral pulses normal and equal  without edema.  Abdomen: Soft & bowel sounds normal. Non-tender w/o guarding, rebound, hernias, masses or organomegaly.  Lymphatics: Unremarkable.  Musculoskeletal: Full ROM all peripheral extremities, joint stability, 5/5 strength and normal gait.  Skin: Warm, dry without exposed rashes, lesions or ecchymosis apparent.  Neuro: Cranial nerves intact, reflexes equal bilaterally. Sensory-motor testing grossly intact. Tendon reflexes grossly intact.  Pysch: Alert & oriented x 3.  Insight and judgement nl & appropriate. No ideations.  Assessment and Plan:  1. Essential hypertension  - Continue medication, monitor blood pressure at home.  - Continue DASH diet.  Reminder to go to the ER if any CP,  SOB, nausea, dizziness, severe HA, changes  vision/speech.  - CBC with Differential/Platelet - COMPLETE METABOLIC PANEL WITH GFR - Magnesium - TSH  2. Hyperlipidemia, mixed  - Continue diet/meds, exercise,& lifestyle modifications.  - Continue monitor periodic cholesterol/liver & renal functions   - Lipid panel - TSH  3. Abnormal glucose  - Hemoglobin A1c - Insulin, random  4. Vitamin D deficiency  - Continue diet, exercise  - Lifestyle modifications.  - Monitor appropriate labs. - Continue supplementation.  - VITAMIN D 25 Hydroxyl  5. CKD (chronic kidney disease) stage 3, GFR 30-59 ml/min (HCC)  - COMPLETE METABOLIC PANEL WITH GFR  6. Idiopathic gout  - Uric acid  7. Gastroesophageal reflux disease  - CBC with Differential/Platelet  8. Medication management  - CBC with Differential/Platelet - COMPLETE METABOLIC PANEL WITH GFR - Magnesium - Lipid panel - TSH - Hemoglobin A1c - Insulin, random - VITAMIN D 25 Hydroxyl - Uric acid       Discussed  regular exercise, BP monitoring, weight control to achieve/maintain BMI less than 25 and discussed med and SE's. Recommended labs to assess and monitor clinical status with further disposition pending results of labs.  I discussed the assessment and treatment plan with the patient. The patient was provided an opportunity to ask questions and all were answered. The patient agreed with the plan and demonstrated an understanding of the instructions.  I provided over 30 minutes of exam, counseling, chart review and  complex critical decision making.  Kirtland Bouchard, MD

## 2019-05-30 ENCOUNTER — Other Ambulatory Visit: Payer: Self-pay | Admitting: *Deleted

## 2019-05-30 ENCOUNTER — Other Ambulatory Visit: Payer: Self-pay

## 2019-05-30 ENCOUNTER — Ambulatory Visit (INDEPENDENT_AMBULATORY_CARE_PROVIDER_SITE_OTHER): Payer: 59 | Admitting: Internal Medicine

## 2019-05-30 VITALS — BP 140/98 | HR 64 | Temp 97.5°F | Resp 16 | Ht 72.5 in | Wt 326.2 lb

## 2019-05-30 DIAGNOSIS — M1000 Idiopathic gout, unspecified site: Secondary | ICD-10-CM

## 2019-05-30 DIAGNOSIS — R7309 Other abnormal glucose: Secondary | ICD-10-CM

## 2019-05-30 DIAGNOSIS — E559 Vitamin D deficiency, unspecified: Secondary | ICD-10-CM

## 2019-05-30 DIAGNOSIS — E782 Mixed hyperlipidemia: Secondary | ICD-10-CM | POA: Diagnosis not present

## 2019-05-30 DIAGNOSIS — N183 Chronic kidney disease, stage 3 unspecified: Secondary | ICD-10-CM

## 2019-05-30 DIAGNOSIS — I10 Essential (primary) hypertension: Secondary | ICD-10-CM | POA: Diagnosis not present

## 2019-05-30 DIAGNOSIS — Z79899 Other long term (current) drug therapy: Secondary | ICD-10-CM

## 2019-05-30 DIAGNOSIS — K219 Gastro-esophageal reflux disease without esophagitis: Secondary | ICD-10-CM

## 2019-05-30 MED ORDER — POTASSIUM CHLORIDE CRYS ER 10 MEQ PO TBCR: 10.0000 meq | EXTENDED_RELEASE_TABLET | Freq: Three times a day (TID) | ORAL | 1 refills | Status: DC

## 2019-05-31 ENCOUNTER — Other Ambulatory Visit: Payer: Self-pay | Admitting: Internal Medicine

## 2019-05-31 LAB — COMPLETE METABOLIC PANEL WITH GFR
AG Ratio: 1.5 (calc) (ref 1.0–2.5)
ALT: 16 U/L (ref 9–46)
AST: 16 U/L (ref 10–35)
Albumin: 4.3 g/dL (ref 3.6–5.1)
Alkaline phosphatase (APISO): 59 U/L (ref 35–144)
BUN: 14 mg/dL (ref 7–25)
CO2: 28 mmol/L (ref 20–32)
Calcium: 9.1 mg/dL (ref 8.6–10.3)
Chloride: 104 mmol/L (ref 98–110)
Creat: 0.98 mg/dL (ref 0.70–1.25)
GFR, Est African American: 96 mL/min/{1.73_m2} (ref >=60)
GFR, Est Non African American: 83 mL/min/{1.73_m2} (ref >=60)
Globulin: 2.9 g/dL (calc) (ref 1.9–3.7)
Glucose, Bld: 111 mg/dL — ABNORMAL HIGH (ref 65–99)
Potassium: 4 mmol/L (ref 3.5–5.3)
Sodium: 139 mmol/L (ref 135–146)
Total Bilirubin: 0.5 mg/dL (ref 0.2–1.2)
Total Protein: 7.2 g/dL (ref 6.1–8.1)

## 2019-05-31 LAB — CBC WITH DIFFERENTIAL/PLATELET
Absolute Monocytes: 809 cells/uL (ref 200–950)
Basophils Absolute: 20 cells/uL (ref 0–200)
Basophils Relative: 0.3 %
Eosinophils Absolute: 156 cells/uL (ref 15–500)
Eosinophils Relative: 2.3 %
HCT: 41 % (ref 38.5–50.0)
Hemoglobin: 13.9 g/dL (ref 13.2–17.1)
Lymphs Abs: 2162 cells/uL (ref 850–3900)
MCH: 30.5 pg (ref 27.0–33.0)
MCHC: 33.9 g/dL (ref 32.0–36.0)
MCV: 89.9 fL (ref 80.0–100.0)
MPV: 11.1 fL (ref 7.5–12.5)
Monocytes Relative: 11.9 %
Neutro Abs: 3652 cells/uL (ref 1500–7800)
Neutrophils Relative %: 53.7 %
Platelets: 162 10*3/uL (ref 140–400)
RBC: 4.56 10*6/uL (ref 4.20–5.80)
RDW: 13.1 % (ref 11.0–15.0)
Total Lymphocyte: 31.8 %
WBC: 6.8 10*3/uL (ref 3.8–10.8)

## 2019-05-31 LAB — LIPID PANEL
Cholesterol: 181 mg/dL (ref <200)
HDL: 37 mg/dL — ABNORMAL LOW (ref >=40)
LDL Cholesterol (Calc): 123 mg/dL (calc) — ABNORMAL HIGH
Non-HDL Cholesterol (Calc): 144 mg/dL (calc) — ABNORMAL HIGH (ref <130)
Total CHOL/HDL Ratio: 4.9 (calc) (ref <5.0)
Triglycerides: 107 mg/dL (ref <150)

## 2019-05-31 LAB — HEMOGLOBIN A1C
Hgb A1c MFr Bld: 5.9 % of total Hgb — ABNORMAL HIGH (ref <5.7)
Mean Plasma Glucose: 123 (calc)
eAG (mmol/L): 6.8 (calc)

## 2019-05-31 LAB — TSH: TSH: 1.69 mIU/L (ref 0.40–4.50)

## 2019-05-31 LAB — URIC ACID: Uric Acid, Serum: 6.4 mg/dL (ref 4.0–8.0)

## 2019-05-31 LAB — INSULIN, RANDOM: Insulin: 20.8 u[IU]/mL — ABNORMAL HIGH

## 2019-05-31 LAB — MAGNESIUM: Magnesium: 1.9 mg/dL (ref 1.5–2.5)

## 2019-05-31 LAB — VITAMIN D 25 HYDROXY (VIT D DEFICIENCY, FRACTURES): Vit D, 25-Hydroxy: 110 ng/mL — ABNORMAL HIGH (ref 30–100)

## 2019-05-31 MED ORDER — ROSUVASTATIN CALCIUM 20 MG PO TABS: ORAL_TABLET | ORAL | 1 refills | Status: DC

## 2019-06-03 ENCOUNTER — Telehealth: Payer: Self-pay | Admitting: Gastroenterology

## 2019-06-03 NOTE — Telephone Encounter (Signed)
° °  Covid-19 Screening Questions ° °  ° °  ° °Do you now or have you had a fever in the last 14 days?     ° °  ° °Do you have any respiratory symptoms of shortness of breath or cough now or in the last 14 days?    ° °  ° °Do you have any family members or close contacts with diagnosed or suspected Covid-19 in the past 14 days?     ° °  ° °Have you been tested for Covid-19 and found to be positive?    ° °  ° °Please make pt aware that care partner may wait in the car or come up to the lobby during the procedure but will need to provide their own mask. ° °  ° °  °

## 2019-06-03 NOTE — Telephone Encounter (Signed)
Pt responded "no" to all screening questions °

## 2019-06-06 ENCOUNTER — Ambulatory Visit (AMBULATORY_SURGERY_CENTER): Payer: 59 | Admitting: Gastroenterology

## 2019-06-06 ENCOUNTER — Other Ambulatory Visit: Payer: Self-pay

## 2019-06-06 ENCOUNTER — Encounter: Payer: Self-pay | Admitting: Gastroenterology

## 2019-06-06 VITALS — BP 124/85 | HR 74 | Temp 98.9°F | Resp 21 | Ht 72.5 in | Wt 326.0 lb

## 2019-06-06 DIAGNOSIS — K2270 Barrett's esophagus without dysplasia: Secondary | ICD-10-CM | POA: Diagnosis present

## 2019-06-06 DIAGNOSIS — Z538 Procedure and treatment not carried out for other reasons: Secondary | ICD-10-CM

## 2019-06-06 DIAGNOSIS — Z1211 Encounter for screening for malignant neoplasm of colon: Secondary | ICD-10-CM | POA: Diagnosis not present

## 2019-06-06 MED ORDER — SODIUM CHLORIDE 0.9 % IV SOLN: 500.0000 mL | Freq: Once | INTRAVENOUS | Status: DC

## 2019-06-06 NOTE — Op Note (Signed)
[facility] Patient Name: Christopher [name] Procedure Date: 06/06/2019 8:05 AM MRN: [id number] Endoscopist: [place]. Loletha Carrow , MD Age: 63 Referring MD:  Date of Birth: 27-Mar-1956 Gender: Male Account #: [id number] Procedure:                Colonoscopy Indications:              Screening for colorectal malignant neoplasm Medicines:                Monitored Anesthesia Care Procedure:                Pre-Anesthesia Assessment:                           - Prior to the procedure, a History and Physical                            was performed, and patient medications and                            allergies were reviewed. The patient's tolerance of                            previous anesthesia was also reviewed. The risks                            and benefits of the procedure and the sedation                            options and risks were discussed with the patient.                            All questions were answered, and informed consent                            was obtained. Prior Anticoagulants: The patient has                            taken no previous anticoagulant or antiplatelet                            agents except for aspirin. ASA Grade Assessment:                            III - A patient with severe systemic disease. After                            reviewing the risks and benefits, the patient was                            deemed in satisfactory condition to undergo the                            procedure.  After obtaining informed consent, the colonoscope                            was passed under direct vision. Throughout the                            procedure, the patient's blood pressure, pulse, and                            oxygen saturations were monitored continuously. The                            Colonoscope was introduced through the anus and                            advanced to the the cecum, identified by                      appendiceal orifice and ileocecal valve. The                            colonoscopy was somewhat difficult due to poor                            bowel prep and significant looping. Successful                            completion of the procedure was aided by applying                            abdominal pressure. The patient tolerated the                            procedure fairly well. The quality of the bowel                            preparation was poor. The ileocecal valve,                            appendiceal orifice, and rectum were photographed.                            The quality of the bowel preparation was evaluated                            using the BBPS [facility] Bowel Preparation Scale)                            with scores of: Right Colon = 1, Transverse Colon =                            1 and Left Colon = 1. The total BBPS score equals 3. Scope In: 8:11:02 AM Scope Out: 8:37:27 AM Scope Withdrawal Time: 0 hours 18 minutes 8 seconds  Total Procedure Duration: 0 hours 26 minutes 25 seconds  Findings:                 The perianal and digital rectal examinations were                            normal.                           The entire examined colon appeared normal on direct                            and retroflexion views (given limitations of prep). Complications:            No immediate complications. Estimated Blood Loss:     Estimated blood loss: none. Impression:               - Preparation of the colon was poor.                           - The entire examined colon is normal on direct and                            retroflexion views.                           - No specimens collected. Recommendation:           - Patient has a contact number available for                            emergencies. The signs and symptoms of potential                            delayed complications were discussed with the                             patient. Return to normal activities tomorrow.                            Written discharge instructions were provided to the                            patient.                           - Resume previous diet.                           - Continue present medications.                           - Repeat colonoscopy in 1 year for screening                            purposes due to poor prep (needs 2 day prep).                           -  See the other procedure note for documentation of                            additional recommendations. Charley Miske L. Loletha Carrow, MD 06/06/2019 8:55:57 AM This report has been signed electronically.

## 2019-06-06 NOTE — Progress Notes (Signed)
Pt's states no medical or surgical changes since previsit or office visit.  Temp taken by KA VS taken by CW

## 2019-06-06 NOTE — Op Note (Signed)
[place] Patient Name: Christopher [name] Procedure Date: 06/06/2019 8:05 AM MRN: [id number] Endoscopist: [place]. Loletha Carrow , MD Age: 63 Referring MD:  Date of Birth: 1955-10-24 Gender: Male Account #: [id number] Procedure:                Upper GI endoscopy Indications:              Surveillance for malignancy due to personal history                            of Barrett's esophagus (Dx 10/2017, indefinite for                            dysplasia. no dysplasia 04/2018) Medicines:                Monitored Anesthesia Care Procedure:                Pre-Anesthesia Assessment:                           - Prior to the procedure, a History and Physical                            was performed, and patient medications and                            allergies were reviewed. The patient's tolerance of                            previous anesthesia was also reviewed. The risks                            and benefits of the procedure and the sedation                            options and risks were discussed with the patient.                            All questions were answered, and informed consent                            was obtained. Prior Anticoagulants: The patient has                            taken no previous anticoagulant or antiplatelet                            agents except for aspirin. ASA Grade Assessment:                            III - A patient with severe systemic disease. After                            reviewing the risks and benefits, the patient was  deemed in satisfactory condition to undergo the                            procedure.                           After obtaining informed consent, the endoscope was                            passed under direct vision. Throughout the                            procedure, the patient's blood pressure, pulse, and                            oxygen saturations were monitored continuously. The                            Endoscope was introduced through the mouth, and                            advanced to the second part of duodenum. The upper                            GI endoscopy was accomplished without difficulty.                            The patient tolerated the procedure well. Scope In: Scope Out: Findings:                 There were esophageal mucosal changes secondary to                            established short-segment Barrett's disease present                            at the gastroesophageal junction (2 short tongues                            and 2 diminituve islands of salmon-colored mucosa).                            No raised or otherwise suspicious areas on WL or                            NBI. Mucosa was biopsied with a cold forceps for                            histology in a targeted manner.                           The exam of the esophagus was otherwise normal.                           Striped erythematous mucosa was  found in the                            gastric antrum.                           The exam of the stomach was otherwise normal.                           The cardia and gastric fundus were normal on                            retroflexion.                           The examined duodenum was normal. Complications:            No immediate complications. Estimated Blood Loss:     Estimated blood loss was minimal. Impression:               - Esophageal mucosal changes secondary to                            established short-segment Barrett's disease.                            Biopsied.                           - Erythematous mucosa in the antrum.                           - Normal examined duodenum. Recommendation:           - Patient has a contact number available for                            emergencies. The signs and symptoms of potential                            delayed complications were discussed with the                             patient. Return to normal activities tomorrow.                            Written discharge instructions were provided to the                            patient.                           - Resume previous diet.                           - Continue present medications.                           - Await pathology results.                           -  Repeat upper endoscopy for surveillance based on                            pathology results. Tory Septer L. Loletha Carrow, MD 06/06/2019 9:00:03 AM This report has been signed electronically.

## 2019-06-06 NOTE — Progress Notes (Signed)
Called to room to assist during endoscopic procedure.  Patient ID and intended procedure confirmed with present staff. Received instructions for my participation in the procedure from the performing physician.  

## 2019-06-06 NOTE — Progress Notes (Signed)
Report given to PACU, vss 

## 2019-06-06 NOTE — Progress Notes (Signed)
Pulse ox 90% on RA, Dr Loletha Carrow updated, vss

## 2019-06-06 NOTE — Patient Instructions (Signed)
You will need a repeat colonoscopy in 1 year with a 2 day prep.  Dr. Loletha Carrow' office will contact you to schedule that.  YOU HAD AN ENDOSCOPIC PROCEDURE TODAY AT [facility]:   Refer to the procedure report that was given to you for any specific questions about what was found during the examination.  If the procedure report does not answer your questions, please call your gastroenterologist to clarify.  If you requested that your care partner not be given the details of your procedure findings, then the procedure report has been included in a sealed envelope for you to review at your convenience later.  YOU SHOULD EXPECT: Some feelings of bloating in the abdomen. Passage of more gas than usual.  Walking can help get rid of the air that was put into your GI tract during the procedure and reduce the bloating. If you had a lower endoscopy (such as a colonoscopy or flexible sigmoidoscopy) you may notice spotting of blood in your stool or on the toilet paper. If you underwent a bowel prep for your procedure, you may not have a normal bowel movement for a few days.  Please Note:  You might notice some irritation and congestion in your nose or some drainage.  This is from the oxygen used during your procedure.  There is no need for concern and it should clear up in a day or so.  SYMPTOMS TO REPORT IMMEDIATELY:   Following lower endoscopy (colonoscopy or flexible sigmoidoscopy):  Excessive amounts of blood in the stool  Significant tenderness or worsening of abdominal pains  Swelling of the abdomen that is new, acute  Fever of 100F or higher   Following upper endoscopy (EGD)  Vomiting of blood or coffee ground material  New chest pain or pain under the shoulder blades  Painful or persistently difficult swallowing  New shortness of breath  Fever of 100F or higher  Black, tarry-looking stools  For urgent or emergent issues, a gastroenterologist can be reached at any hour by calling  [phone].   DIET:  We do recommend a small meal at first, but then you may proceed to your regular diet.  Drink plenty of fluids but you should avoid alcoholic beverages for 24 hours.  ACTIVITY:  You should plan to take it easy for the rest of today and you should NOT DRIVE or use heavy machinery until tomorrow (because of the sedation medicines used during the test).    FOLLOW UP: Our staff will call the number listed on your records 48-72 hours following your procedure to check on you and address any questions or concerns that you may have regarding the information given to you following your procedure. If we do not reach you, we will leave a message.  We will attempt to reach you two times.  During this call, we will ask if you have developed any symptoms of COVID 19. If you develop any symptoms (ie: fever, flu-like symptoms, shortness of breath, cough etc.) before then, please call [phone].  If you test positive for Covid 19 in the 2 weeks post procedure, please call and report this information to [place].    If any biopsies were taken you will be contacted by phone or by letter within the next 1-3 weeks.  Please call us at [phone] if you have not heard about the biopsies in 3 weeks.    SIGNATURES/CONFIDENTIALITY: You and/or your care partner have signed paperwork which will be entered into your  electronic medical record.  These signatures attest to the fact that that the information above on your After Visit Summary has been reviewed and is understood.  Full responsibility of the confidentiality of this discharge information lies with you and/or your care-partner.

## 2019-06-08 ENCOUNTER — Telehealth: Payer: Self-pay

## 2019-06-08 NOTE — Telephone Encounter (Signed)
  Follow up Call-  Call back number 06/06/2019 04/27/2018 10/06/2017  Post procedure Call Back phone  # [phone]  Permission to leave phone message Yes Yes Yes  Some recent data might be hidden     Patient questions:  Do you have a fever, pain , or abdominal swelling? No. Pain Score  0 *  Have you tolerated food without any problems? Yes.    Have you been able to return to your normal activities? Yes.    Do you have any questions about your discharge instructions: Diet   No. Medications  No. Follow up visit  No.  Do you have questions or concerns about your Care? No.  Actions: * If pain score is 4 or above: No action needed, pain <4. 1. Have you developed a fever since your procedure? no  2.   Have you had an respiratory symptoms (SOB or cough) since your procedure? no  3.   Have you tested positive for COVID 19 since your procedure no  4.   Have you had any family members/close contacts diagnosed with the COVID 19 since your procedure?  no   If yes to any of these questions please route to Joylene John, RN and Alphonsa Gin, Therapist, sports.

## 2019-06-09 ENCOUNTER — Encounter: Payer: Self-pay | Admitting: Gastroenterology

## 2019-06-29 ENCOUNTER — Other Ambulatory Visit: Payer: Self-pay | Admitting: Internal Medicine

## 2019-06-29 DIAGNOSIS — R450 Nervousness: Secondary | ICD-10-CM

## 2019-07-05 ENCOUNTER — Other Ambulatory Visit: Payer: Self-pay

## 2019-07-05 DIAGNOSIS — Z20822 Contact with and (suspected) exposure to covid-19: Secondary | ICD-10-CM

## 2019-07-06 LAB — NOVEL CORONAVIRUS, NAA: SARS-CoV-2, NAA: NOT DETECTED

## 2019-07-14 ENCOUNTER — Other Ambulatory Visit: Payer: Self-pay | Admitting: Internal Medicine

## 2019-07-14 MED ORDER — MONTELUKAST SODIUM 10 MG PO TABS: ORAL_TABLET | ORAL | 3 refills | Status: DC

## 2019-08-11 ENCOUNTER — Other Ambulatory Visit: Payer: Self-pay | Admitting: Internal Medicine

## 2019-08-11 DIAGNOSIS — R450 Nervousness: Secondary | ICD-10-CM

## 2019-08-17 ENCOUNTER — Other Ambulatory Visit: Payer: Self-pay

## 2019-08-17 ENCOUNTER — Encounter: Payer: Self-pay | Admitting: Adult Health

## 2019-08-17 ENCOUNTER — Ambulatory Visit: Payer: 59 | Admitting: Adult Health

## 2019-08-17 VITALS — BP 124/90 | HR 66 | Temp 97.7°F | Wt 341.0 lb

## 2019-08-17 DIAGNOSIS — F3341 Major depressive disorder, recurrent, in partial remission: Secondary | ICD-10-CM

## 2019-08-17 DIAGNOSIS — E782 Mixed hyperlipidemia: Secondary | ICD-10-CM

## 2019-08-17 DIAGNOSIS — K219 Gastro-esophageal reflux disease without esophagitis: Secondary | ICD-10-CM

## 2019-08-17 DIAGNOSIS — Z79899 Other long term (current) drug therapy: Secondary | ICD-10-CM

## 2019-08-17 DIAGNOSIS — F419 Anxiety disorder, unspecified: Secondary | ICD-10-CM | POA: Insufficient documentation

## 2019-08-17 DIAGNOSIS — I10 Essential (primary) hypertension: Secondary | ICD-10-CM | POA: Diagnosis not present

## 2019-08-17 DIAGNOSIS — I700 Atherosclerosis of aorta: Secondary | ICD-10-CM | POA: Diagnosis not present

## 2019-08-17 DIAGNOSIS — R600 Localized edema: Secondary | ICD-10-CM

## 2019-08-17 DIAGNOSIS — I708 Atherosclerosis of other arteries: Secondary | ICD-10-CM

## 2019-08-17 DIAGNOSIS — E559 Vitamin D deficiency, unspecified: Secondary | ICD-10-CM

## 2019-08-17 DIAGNOSIS — R7303 Prediabetes: Secondary | ICD-10-CM

## 2019-08-17 DIAGNOSIS — R7309 Other abnormal glucose: Secondary | ICD-10-CM

## 2019-08-17 DIAGNOSIS — M1000 Idiopathic gout, unspecified site: Secondary | ICD-10-CM

## 2019-08-17 NOTE — Patient Instructions (Addendum)
Goals    . Weight (lb) < 300 lb (136.1 kg)       Watch salt/sodium at home  Get on a scale once a week   Continue good water intake      Drink 1/2 your body weight in fluid ounces of water daily; drink a tall glass of water 30 min before meals  Don't eat until you're stuffed- listen to your stomach and eat until you are 80% full   Try eating off of a salad plate; wait 10 min after finishing before going back for seconds  Start by eating the vegetables on your plate; aim for 50% of your meals to be fruits or vegetables  Then eat your protein - lean meats (grass fed if possible), fish, beans, nuts in moderation  Eat your carbs/starch last ONLY if you still are hungry. If you can, stop before finishing it all  Avoid sugar and flour - the closer it looks to it's original form in nature, typically the better it is for you  Splurge in moderation - "assign" days when you get to splurge and have the "bad stuff" - I like to follow a 80% - 20% plan- "good" choices 80 % of the time, "bad" choices in moderation 20% of the time  Simple equation is: Calories out > calories in = weight loss - even if you eat the bad stuff, if you limit portions, you will still lose weight        Edema  Edema is an abnormal buildup of fluids in the body tissues and under the skin. Swelling of the legs, feet, and ankles is a common symptom that becomes more likely as you get older. Swelling is also common in looser tissues, like around the eyes. When the affected area is squeezed, the fluid may move out of that spot and leave a dent for a few moments. This dent is called pitting edema. There are many possible causes of edema. Eating too much salt (sodium) and being on your feet or sitting for a long time can cause edema in your legs, feet, and ankles. Hot weather may make edema worse. Common causes of edema include:  Heart failure.  Liver or kidney disease.  Weak leg blood vessels.  Cancer.  An  injury.  Pregnancy.  Medicines.  Being obese.  Low protein levels in the blood. Edema is usually painless. Your skin may look swollen or shiny. Follow these instructions at home:  Keep the affected body part raised (elevated) above the level of your heart when you are sitting or lying down.  Do not sit still or stand for long periods of time.  Do not wear tight clothing. Do not wear garters on your upper legs.  Exercise your legs to get your circulation going. This helps to move the fluid back into your blood vessels, and it may help the swelling go down.  Wear elastic bandages or support stockings to reduce swelling as told by your health care provider.  Eat a low-salt (low-sodium) diet to reduce fluid as told by your health care provider.  Depending on the cause of your swelling, you may need to limit how much fluid you drink (fluid restriction).  Take over-the-counter and prescription medicines only as told by your health care provider. Contact a health care provider if:  Your edema does not get better with treatment.  You have heart, liver, or kidney disease and have symptoms of edema.  You have sudden and unexplained weight gain.  Get help right away if:  You develop shortness of breath or chest pain.  You cannot breathe when you lie down.  You develop pain, redness, or warmth in the swollen areas.  You have heart, liver, or kidney disease and suddenly get edema.  You have a fever and your symptoms suddenly get worse. Summary  Edema is an abnormal buildup of fluids in the body tissues and under the skin.  Eating too much salt (sodium) and being on your feet or sitting for a long time can cause edema in your legs, feet, and ankles.  Keep the affected body part raised (elevated) above the level of your heart when you are sitting or lying down. This information is not intended to replace advice given to you by your health care provider. Make sure you discuss any  questions you have with your health care provider. Document Released: 08/18/2005 Document Revised: 08/21/2017 Document Reviewed: 09/20/2016 Elsevier Patient Education  2020 [employer].     Deep Vein Thrombosis  Deep vein thrombosis (DVT) is a condition in which a blood clot forms in a deep vein, such as a lower leg, thigh, or arm vein. A clot is blood that has thickened into a gel or solid. This condition is dangerous. It can lead to serious and even life-threatening complications if the clot travels to the lungs and causes a blockage (pulmonary embolism). It can also damage veins in the leg. This can result in leg pain, swelling, discoloration, and sores (post-thrombotic syndrome). What are the causes? This condition may be caused by:  A slowdown of blood flow.  Damage to a vein.  A condition that causes blood to clot more easily, such as an inherited clotting disorder. What increases the risk? The following factors may make you more likely to develop this condition:  Being overweight.  Being older, especially over age 32.  Sitting or lying down for more than four hours.  Being in the hospital.  Lack of physical activity (sedentary lifestyle).  Pregnancy, being in childbirth, or having recently given birth.  Taking medicines that contain estrogen, such as medicines to prevent pregnancy.  Smoking.  A history of any of the following: ? Blood clots or a blood clotting disease. ? Peripheral vascular disease. ? Inflammatory bowel disease. ? Cancer. ? Heart disease. ? Genetic conditions that affect how your blood clots, such as Factor V Leiden mutation. ? Neurological diseases that affect your legs (leg paresis). ? A recent injury, such as a car accident. ? Major or lengthy surgery. ? A central line placed inside a large vein. What are the signs or symptoms? Symptoms of this condition include:  Swelling, pain, or tenderness in an arm or leg.  Warmth, redness, or  discoloration in an arm or leg. If the clot is in your leg, symptoms may be more noticeable or worse when you stand or walk. Some people may not develop any symptoms. How is this diagnosed? This condition is diagnosed with:  A medical history and physical exam.  Tests, such as: ? Blood tests. These are done to check how well your blood clots. ? Ultrasound. This is done to check for clots. ? Venogram. For this test, contrast dye is injected into a vein and X-rays are taken to check for any clots. How is this treated? Treatment for this condition depends on:  The cause of your DVT.  Your risk for bleeding or developing more clots.  Any other medical conditions that you have. Treatment may include:  Taking a blood thinner (anticoagulant). This type of medicine prevents clots from forming. It may be taken by mouth, injected under the skin, or injected through an IV (catheter).  Injecting clot-dissolving medicines into the affected vein (catheter-directed thrombolysis).  Having surgery. Surgery may be done to: ? Remove the clot. ? Place a filter in a large vein to catch blood clots before they reach the lungs. Some treatments may be continued for up to six months. Follow these instructions at home: If you are taking blood thinners:  Take the medicine exactly as told by your health care provider. Some blood thinners need to be taken at the same time every day. Do not skip a dose.  Talk with your health care provider before you take any medicines that contain aspirin or NSAIDs. These medicines increase your risk for dangerous bleeding.  Ask your health care provider about foods and drugs that could change the way the medicine works (may interact). Avoid those things if your health care provider tells you to do so.  Blood thinners can cause easy bruising and may make it difficult to stop bleeding. Because of this: ? Be very careful when using knives, scissors, or other sharp  objects. ? Use an electric razor instead of a blade. ? Avoid activities that could cause injury or bruising, and follow instructions about how to prevent falls.  Wear a medical alert bracelet or carry a card that lists what medicines you take. General instructions  Take over-the-counter and prescription medicines only as told by your health care provider.  Return to your normal activities as told by your health care provider. Ask your health care provider what activities are safe for you.  Wear compression stockings if recommended by your health care provider.  Keep all follow-up visits as told by your health care provider. This is important. How is this prevented? To lower your risk of developing this condition again:  For 30 or more minutes every day, do an activity that: ? Involves moving your arms and legs. ? Increases your heart rate.  When traveling for longer than four hours: ? Exercise your arms and legs every hour. ? Drink plenty of water. ? Avoid drinking alcohol.  Avoid sitting or lying for a long time without moving your legs.  If you have surgery or you are hospitalized, ask about ways to prevent blood clots. These may include taking frequent walks or using anticoagulants.  Stay at a healthy weight.  If you are a woman who is older than age 30, avoid unnecessary use of medicines that contain estrogen, such as some birth control pills.  Do not use any products that contain nicotine or tobacco, such as cigarettes and e-cigarettes. This is especially important if you take estrogen medicines. If you need help quitting, ask your health care provider. Contact a health care provider if:  You miss a dose of your blood thinner.  Your menstrual period is heavier than usual.  You have unusual bruising. Get help right away if:  You have: ? New or increased pain, swelling, or redness in an arm or leg. ? Numbness or tingling in an arm or leg. ? Shortness of  breath. ? Chest pain. ? A rapid or irregular heartbeat. ? A severe headache or confusion. ? A cut that will not stop bleeding.  There is blood in your vomit, stool, or urine.  You have a serious fall or accident, or you hit your head.  You feel light-headed or dizzy.  You  cough up blood. These symptoms may represent a serious problem that is an emergency. Do not wait to see if the symptoms will go away. Get medical help right away. Call your local emergency services (911 in the [place] Do not drive yourself to the hospital. Summary  Deep vein thrombosis (DVT) is a condition in which a blood clot forms in a deep vein, such as a lower leg, thigh, or arm vein.  Symptoms can include swelling, warmth, pain, and redness in your leg or arm.  This condition may be treated with a blood thinner (anticoagulant medicine), medicine that is injected to dissolve blood clots,compression stockings, or surgery.  If you are prescribed blood thinners, take them exactly as told. This information is not intended to replace advice given to you by your health care provider. Make sure you discuss any questions you have with your health care provider. Document Released: 08/18/2005 Document Revised: 07/31/2017 Document Reviewed: 01/16/2017 Elsevier Patient Education  2020 [employer].

## 2019-08-17 NOTE — Progress Notes (Signed)
FOLLOW UP  Assessment and Plan:   Atherosclerosis of aorta Per CT 12/2017 Control blood pressure, cholesterol, glucose, increase exercise.   Hypertension Well controlled with current medications  Monitor blood pressure at home; patient to call if consistently greater than 130/80 Continue DASH diet.   Reminder to go to the ER if any CP, SOB, nausea, dizziness, severe HA, changes vision/speech, left arm numbness and tingling and jaw pain.  Cholesterol Currently borderline elevated; continue fenofibrate and diet; if LDL progresses further will initiate statin Continue low cholesterol diet and exercise.  Check lipid panel.   Prediabetes Continue diet and exercise.  Perform daily foot/skin check, notify office of any concerning changes.  Check A1C  Morbid obesity- BMI 45 with prediabetes, htn, hyperlipidemia Long discussion about weight loss, diet, and exercise Recommended diet heavy in fruits and veggies and low in animal meats, cheeses, and dairy products, appropriate calorie intake, portions Weigh weekly for progress monitoring; slow steady weight loss is goal  Discussed ideal weight for height, initial goal <300 lb Portion sizes and servings reviewed; information provided Will follow up in 3 months  Vitamin D Def Above goal at last visit; he has reduced dose to 4 days a week  continue supplementation to maintain goal of 60-100 Defer Vit D level  Gout Continue allopurinolDiet discussed Check uric acid as needed  Hypogonadism - continue to monitor, states medication is helping with symptoms of low T.   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   Chronic venous insufficiency - elevate legs TID, increase activity, increase water, decrease sodium intake.   Wear compression socks more routinely if available. Return to the office if no change with symptoms. Check labs. - weight loss emphasized  RLE edema Asymmetrical, persistent x 2 months, doesn't improve with diuretic or elevation R calf 62.5 cm; left 58 cm; no acute event prior, neg Homan's, no clotting history however Concerning for DVT; after discussion with patient will proceed with US DVT study Initiate NOAC if indicated Consider referral to vascular due to severe baseline chronic edema Go to the ER if any chest pain, shortness of breath, nausea, dizziness, severe HA, changes vision/speech  Continue diet and meds as discussed. Further disposition pending results of labs. Discussed med's effects and SE's.   Over 30 minutes of exam, counseling, chart review, and critical decision making was performed.   Future Appointments  Date Time Provider [place]  11/21/2019  2:00 PM Unk Pinto, MD [id number] None    ----------------------------------------------------------------------------------------------------------------------  HPI 63 y.o. male  presents for 3 month follow up on hypertension, cholesterol, prediabetes, morbid obesity, hypogonadism, gout, depression/anxiety and vitamin D deficiency.   He is concerned about persistent asymmetrical swelling, has chronic venous insufficiency bilaterally at baseline, R calf and ankle has been more swollen than left, not improving for the last 2 months; L edema will improve overnight, R has been persistent. Denies pain, notable event/trauma prior to onset, fever/chills, calf pain, wound. Denies personal history of clots. Denies knowledge of clotting disorder in family.   he has a diagnosis of depression/anxiety and is currently on wellbutrin daily and xanax 0.5-1 mg TID PRN, reports symptoms are well controlled on current regimen. he currently reports taking mainly for sleep, takes 1/2 tab 2-3 days week, has reduced use.   Patient has  GERD / Barrett's (+ Bx) on Nexium &  followed by Dr Loletha Carrow. He has follow up planned for next year.   BMI is Body mass index is 45.61 kg/m., he is working on diet and exercise. He reports is walking, gets 2-3 miles daily. He does watch diet, has cut back late night eating, he is very surprised with weight gain this visit. He wonders if this may be fluid.  Not currently weighing.  Wt Readings from Last 3 Encounters:  08/17/19 (!) 341 lb (154.7 kg)  06/06/19 (!) 326 lb (147.9 kg)  05/30/19 (!) 326 lb 3.2 oz (148 kg)   He has aorto-iliac atherosclerosis per CT 01/08/2018 His blood pressure has been controlled at home, today their BP is BP: 124/90  He does workout. He denies chest pain, shortness of breath, dizziness.   He is on cholesterol medication (fenofibrate, back on rosuvastatin 20 mg daily) and denies myalgias. His cholesterol is not at goal. The cholesterol last visit was:   Lab Results  Component Value Date   CHOL 181 05/30/2019   HDL 37 (L) 05/30/2019   LDLCALC 123 (H) 05/30/2019   TRIG 107 05/30/2019   CHOLHDL 4.9 05/30/2019    He has been working on diet and exercise for prediabetes, and denies foot ulcerations, increased appetite, nausea, paresthesia of the feet, polydipsia, polyuria, visual disturbances, vomiting and weight loss. Last A1C in the office was:  Lab Results  Component Value Date   HGBA1C 5.9 (H) 05/30/2019   Patient is on Vitamin D supplement and above goal at recent check:    Lab Results  Component Value Date   VD25OH 110 (H) 05/30/2019     Patient is on allopurinol for gout and does not report a recent flare.  Lab Results  Component Value Date   LABURIC 6.4 05/30/2019   He has a history of testosterone deficiency and is on testosterone replacement (gel). He states that the testosterone helps with his energy, libido, muscle mass. Lab Results  Component Value Date   TESTOSTERONE 290 11/01/2018      Current Medications:  Current Outpatient  Medications on File Prior to Visit  Medication Sig  . 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 allopurinol (ZYLOPRIM) 300 MG tablet Take 1 tablet Daily to Prevent Gout  . ALPRAZolam (XANAX) 1 MG tablet Take 1/2-1 tablet 2 - 3 x /day ONLY if needed for Panic or Anxiety Attack &  limit to 5 days /week to avoid addiction  . amLODipine (NORVASC) 10 MG tablet Take 1 tablet every night for BP  . aspirin EC 81 MG tablet Take 81 mg by mouth daily.  Marland Kitchen atenolol (TENORMIN) 100 MG tablet TAKE 1 TABLET BY MOUTH  DAILY FOR BLOOD PRESSURE  . buPROPion (WELLBUTRIN XL) 300 MG 24 hr tablet Take 1 table tevery Morning for Mood, Focus & Concentration  . Cyanocobalamin (VITAMIN B 12 PO) Take 1 tablet by mouth daily.    Marland Kitchen esomeprazole (NEXIUM) 40 MG capsule Take 1 capsule 2 x /day for GERD & Barrett's Esophagus  . fenofibrate 160 MG tablet Take 1 tablet Daily for Triglycerides  . fish oil-omega-3 fatty acids 1000 MG capsule Take 1 g by mouth daily.   . Flaxseed, Linseed, (FLAX SEEDS PO) Take by mouth.  . furosemide (LASIX) 40 MG tablet Take 1 tablet 2 x/day for BP & Fluid  . magnesium oxide (MAG-OX) 400 MG tablet Take 400 mg by mouth 2 (two) times daily.   . methocarbamol (ROBAXIN) 500 MG  tablet Take 1 tablet at bedtime for Muscle Spasm.  . montelukast (SINGULAIR) 10 MG tablet Take 1 tablet daily for Allergies & Asthma  . Multiple Vitamin (MULTIVITAMIN WITH MINERALS) TABS tablet Take 1 tablet by mouth daily.  . potassium chloride (K-DUR) 10 MEQ tablet Take 1 tablet (10 mEq total) by mouth 3 (three) times daily.  . rosuvastatin (CRESTOR) 20 MG tablet Take 1 tablet Daily for Cholesterol  . tadalafil (CIALIS) 20 MG tablet Take 1/2 to 1 tablet every 2 to 3 days as needed for XXXX  . testosterone (TESTIM) 50 MG/5GM (1%) GEL Apply 1 Testosterone gel tube Daily  . valsartan (DIOVAN) 320 MG tablet Take 1 tablet every morning for BP  . vitamin C (ASCORBIC ACID) 500 MG tablet Take 500 mg by  mouth daily.   . Vitamin D, Ergocalciferol, (DRISDOL) 1.25 MG (50000 UT) CAPS capsule Take 1 capsule Daily for severe Vit D  Deficiency  . Zinc 50 MG TABS Take by mouth daily.  . phentermine (ADIPEX-P) 37.5 MG tablet Take 1/2 to 1 tablet every morning for Dieting & Weight Loss (Patient not taking: Reported on 05/23/2019)   No current facility-administered medications on file prior to visit.     Allergies: No Active Allergies   Medical History:  Past Medical History:  Diagnosis Date  . Allergy   . Anxiety   . Asthma    as a child  . Barrett's esophagus   . GERD (gastroesophageal reflux disease)   . Gout    never had a flare up of gout  . H/O hiatal hernia   . Hyperlipidemia   . Hypertension   . Prediabetes   . Sleep apnea    does not use  cpap  . Vitamin D deficiency    Family history- Reviewed and unchanged Social history- Reviewed and unchanged   Review of Systems:  Review of Systems  Constitutional: Negative for malaise/fatigue and weight loss.  HENT: Negative for hearing loss and tinnitus.   Eyes: Negative for blurred vision and double vision.  Respiratory: Negative for cough, shortness of breath and wheezing.   Cardiovascular: Positive for leg swelling (bilateral; R persistent & increased x 2 months). Negative for chest pain, palpitations, orthopnea and claudication.  Gastrointestinal: Negative for abdominal pain, blood in stool, constipation, diarrhea, heartburn, melena, nausea and vomiting.  Genitourinary: Negative.   Musculoskeletal: Negative for back pain, falls, joint pain and myalgias.  Skin: Negative for rash.  Neurological: Negative for dizziness, tingling, sensory change, weakness and headaches.  Endo/Heme/Allergies: Negative for polydipsia.  Psychiatric/Behavioral: Negative.   All other systems reviewed and are negative.   Physical Exam: BP 124/90   Pulse 66   Temp 97.7 F (36.5 C)   Wt (!) 341 lb (154.7 kg)   SpO2 93%   BMI 45.61 kg/m  Wt  Readings from Last 3 Encounters:  08/17/19 (!) 341 lb (154.7 kg)  06/06/19 (!) 326 lb (147.9 kg)  05/30/19 (!) 326 lb 3.2 oz (148 kg)   General Appearance: Well nourished,morbidly obese patient, in no apparent distress. Eyes: PERRLA, EOMs, conjunctiva no swelling or erythema Sinuses: No Frontal/maxillary tenderness ENT/Mouth: Ext aud canals clear, TMs without erythema, bulging. Mask in place; oral exam deferred in light of pandemic and no concerns. Hearing normal.  Neck: Supple, thyroid normal.  Respiratory: Respiratory effort normal, BS equal bilaterally without rales, rhonchi, wheezing or stridor.  Cardio: RRR with no MRGs. Significant nonpitting edema of bilateral ankles and lower legs limits exam; R calf 62.5 cm;  L calf 58 cm; neg Homan's; calf non-tender; non-erythematous Abdomen: Soft, obese abdomen limiting exam; + BS.  Non tender, no guarding, rebound, palpable hernias or masses. Lymphatics: Non tender without lymphadenopathy.  Musculoskeletal: Full ROM, 5/5 strength, Normal gait Skin: Warm, dry without rashes, lesions, ecchymosis.  Neuro: Cranial nerves intact. No cerebellar symptoms.  Psych: Awake and oriented X 3, normal affect, Insight and Judgment appropriate.   Izora Ribas, NP 3:55 PM [facility]

## 2019-08-18 LAB — LIPID PANEL
Cholesterol: 138 mg/dL (ref <200)
HDL: 37 mg/dL — ABNORMAL LOW (ref >=40)
LDL Cholesterol (Calc): 81 mg/dL (calc)
Non-HDL Cholesterol (Calc): 101 mg/dL (calc) (ref <130)
Total CHOL/HDL Ratio: 3.7 (calc) (ref <5.0)
Triglycerides: 108 mg/dL (ref <150)

## 2019-08-18 LAB — CBC WITH DIFFERENTIAL/PLATELET
Absolute Monocytes: 806 cells/uL (ref 200–950)
Basophils Absolute: 43 cells/uL (ref 0–200)
Basophils Relative: 0.6 %
Eosinophils Absolute: 158 cells/uL (ref 15–500)
Eosinophils Relative: 2.2 %
HCT: 40.7 % (ref 38.5–50.0)
Hemoglobin: 14.1 g/dL (ref 13.2–17.1)
Lymphs Abs: 2664 cells/uL (ref 850–3900)
MCH: 31.5 pg (ref 27.0–33.0)
MCHC: 34.6 g/dL (ref 32.0–36.0)
MCV: 90.8 fL (ref 80.0–100.0)
MPV: 11.2 fL (ref 7.5–12.5)
Monocytes Relative: 11.2 %
Neutro Abs: 3528 cells/uL (ref 1500–7800)
Neutrophils Relative %: 49 %
Platelets: 145 10*3/uL (ref 140–400)
RBC: 4.48 10*6/uL (ref 4.20–5.80)
RDW: 13.5 % (ref 11.0–15.0)
Total Lymphocyte: 37 %
WBC: 7.2 10*3/uL (ref 3.8–10.8)

## 2019-08-18 LAB — TSH: TSH: 1.4 mIU/L (ref 0.40–4.50)

## 2019-08-18 LAB — MAGNESIUM: Magnesium: 1.8 mg/dL (ref 1.5–2.5)

## 2019-08-18 LAB — COMPLETE METABOLIC PANEL WITH GFR
AG Ratio: 1.5 (calc) (ref 1.0–2.5)
ALT: 20 U/L (ref 9–46)
AST: 18 U/L (ref 10–35)
Albumin: 4.3 g/dL (ref 3.6–5.1)
Alkaline phosphatase (APISO): 59 U/L (ref 35–144)
BUN: 13 mg/dL (ref 7–25)
CO2: 28 mmol/L (ref 20–32)
Calcium: 9.7 mg/dL (ref 8.6–10.3)
Chloride: 105 mmol/L (ref 98–110)
Creat: 1.09 mg/dL (ref 0.70–1.25)
GFR, Est African American: 84 mL/min/{1.73_m2} (ref >=60)
GFR, Est Non African American: 73 mL/min/{1.73_m2} (ref >=60)
Globulin: 2.8 g/dL (calc) (ref 1.9–3.7)
Glucose, Bld: 115 mg/dL — ABNORMAL HIGH (ref 65–99)
Potassium: 3.8 mmol/L (ref 3.5–5.3)
Sodium: 142 mmol/L (ref 135–146)
Total Bilirubin: 0.4 mg/dL (ref 0.2–1.2)
Total Protein: 7.1 g/dL (ref 6.1–8.1)

## 2019-08-18 LAB — HEMOGLOBIN A1C
Hgb A1c MFr Bld: 5.9 % of total Hgb — ABNORMAL HIGH (ref <5.7)
Mean Plasma Glucose: 123 (calc)
eAG (mmol/L): 6.8 (calc)

## 2019-08-19 ENCOUNTER — Other Ambulatory Visit: Payer: Self-pay

## 2019-08-19 ENCOUNTER — Ambulatory Visit (HOSPITAL_COMMUNITY)
Admission: RE | Admit: 2019-08-19 | Discharge: 2019-08-19 | Disposition: A | Discharge status: Home / Self Care | Payer: 59 | Source: Ambulatory Visit | Attending: Cardiovascular Disease | Admitting: Cardiovascular Disease

## 2019-08-19 DIAGNOSIS — R600 Localized edema: Secondary | ICD-10-CM

## 2019-09-12 ENCOUNTER — Ambulatory Visit: Payer: 59 | Admitting: Adult Health

## 2019-10-29 ENCOUNTER — Other Ambulatory Visit: Payer: Self-pay | Admitting: Internal Medicine

## 2019-10-29 DIAGNOSIS — R450 Nervousness: Secondary | ICD-10-CM

## 2019-11-18 ENCOUNTER — Other Ambulatory Visit: Payer: Self-pay

## 2019-11-18 DIAGNOSIS — I10 Essential (primary) hypertension: Secondary | ICD-10-CM

## 2019-11-18 MED ORDER — VALSARTAN 320 MG PO TABS: ORAL_TABLET | ORAL | 3 refills | Status: DC

## 2019-11-20 ENCOUNTER — Encounter: Payer: Self-pay | Admitting: Internal Medicine

## 2019-11-20 NOTE — Progress Notes (Signed)
Annual  Screening/Preventative Visit  & Comprehensive Evaluation & Examination     This very nice 64 y.o.  [name] presents for a Screening /Preventative Visit & comprehensive evaluation and management of multiple medical co-morbidities.  Patient has been followed for HTN, HLD, Prediabetes and Vitamin D Deficiency. Patient has hx/o Gout controlled on Allopurinol. Patient is followed by Dr Loletha Carrow for (+) Barrett's / GERD.     HTN predates since 2005. Patient's BP has occasionally been labile , but more recently has been controlled and today's BP was initially slightly elevated & rechecked at goal - 136/82. Patient denies any cardiac symptoms as chest pain, palpitations, shortness of breath, dizziness or ankle swelling. 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     Patient's hyperlipidemia is controlled with diet and medications. Patient denies myalgias or other medication SE's. Last lipids were at goal:  Lab Results  Component Value Date   CHOL 138 08/17/2019   HDL 37 (L) 08/17/2019   LDLCALC 81 08/17/2019   TRIG 108 08/17/2019   CHOLHDL 3.7 08/17/2019       Patient has Gluttony / Morbid Obesity (BMI 45.6+) and  Consequent  prediabetes (A1c 5.9% / 2010)  and patient denies reactive hypoglycemic symptoms, visual blurring, diabetic polys or paresthesias. Last A1c was near goal:  Lab Results  Component Value Date   HGBA1C 5.9 (H) 08/17/2019       Patient has Testosterone Deficiency  ("228" / 2001) and is on  replacement therapy by gel reporting improved sense of well being.     Finally, patient has history of Vitamin D Deficiency("17" / 2008) and last vitamin D was slightly elevated & dose was tapered:  Lab Results  Component Value Date   VD25OH 110 (H) 05/30/2019    Current Outpatient Medications on File Prior to Visit  Medication Sig  . 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 allopurinol (ZYLOPRIM) 300 MG tablet Take 1 tablet Daily to  Prevent Gout  . ALPRAZolam (XANAX) 1 MG tablet Take 1/2 - 1 tablet 2 - 3 x /day ONLY if needed for Anxiety Attack &  limit to 5 days /week to avoid Addiction & Dementia  . amLODipine (NORVASC) 10 MG tablet Take 1 tablet every night for BP  . aspirin EC 81 MG tablet Take 81 mg by mouth daily.  Marland Kitchen atenolol (TENORMIN) 100 MG tablet TAKE 1 TABLET BY MOUTH  DAILY FOR BLOOD PRESSURE  . buPROPion (WELLBUTRIN XL) 300 MG 24 hr tablet Take 1 table tevery Morning for Mood, Focus & Concentration  . Cyanocobalamin (VITAMIN B 12 PO) Take 1 tablet by mouth daily.    Marland Kitchen esomeprazole (NEXIUM) 40 MG capsule Take 1 capsule 2 x /day for GERD & Barrett's Esophagus  . fish oil-omega-3 fatty acids 1000 MG capsule Take 1 g by mouth daily.   . Flaxseed, Linseed, (FLAX SEEDS PO) Take by mouth.  . furosemide (LASIX) 40 MG tablet Take 1 tablet 2 x/day for BP & Fluid  . magnesium oxide (MAG-OX) 400 MG tablet Take 400 mg by mouth 2 (two) times daily.   . methocarbamol (ROBAXIN) 500 MG tablet Take 1 tablet at bedtime for Muscle Spasm.  . montelukast (SINGULAIR) 10 MG tablet Take 1 tablet daily for Allergies & Asthma  . Multiple Vitamin (MULTIVITAMIN WITH MINERALS) TABS tablet Take 1 tablet by mouth daily.  . potassium chloride (K-DUR) 10 MEQ tablet Take 1 tablet (10 mEq total) by mouth  3 (three) times daily.  . rosuvastatin (CRESTOR) 20 MG tablet Take 1 tablet Daily for Cholesterol  . tadalafil (CIALIS) 20 MG tablet Take 1/2 to 1 tablet every 2 to 3 days as needed for XXXX  . testosterone (TESTIM) 50 MG/5GM (1%) GEL Apply 1 Testosterone gel tube Daily  . valsartan (DIOVAN) 320 MG tablet Take 1 tablet every morning for BP  . vitamin C (ASCORBIC ACID) 500 MG tablet Take 500 mg by mouth daily.   . Vitamin D, Ergocalciferol, (DRISDOL) 1.25 MG (50000 UT) CAPS capsule Take 1 capsule Daily for severe Vit D  Deficiency  . Zinc 50 MG TABS Take by mouth daily.   No current facility-administered medications on file prior to visit.    No Known Allergies   Past Medical History:  Diagnosis Date  . Allergy   . Anxiety   . Asthma    as a child  . Barrett's esophagus   . GERD (gastroesophageal reflux disease)   . Gout    never had a flare up of gout  . H/O hiatal hernia   . Hyperlipidemia   . Hypertension   . Prediabetes   . Sleep apnea    does not use  cpap  . Vitamin D deficiency    Health Maintenance  Topic Date Due  . COLONOSCOPY  06/05/2020  . TETANUS/TDAP  08/02/2023  . INFLUENZA VACCINE  Completed  . Hepatitis C Screening  Discontinued  . HIV Screening  Discontinued   Immunization History  Administered Date(s) Administered  . Influenza Inj Mdck Quad With Preservative 07/02/2017, 06/15/2018  . Influenza Split 06/28/2014, 06/28/2015  . Influenza,inj,Quad PF,6+ Mos 06/19/2019  . Influenza,inj,quad, With Preservative 08/11/2016  . Influenza-Unspecified 09/01/2012  . PPD Test 06/28/2014, 01/18/2016, 08/11/2016, 10/12/2017, 11/01/2018, 11/21/2019  . Pneumococcal-Unspecified 09/01/1998  . Tdap 08/01/2013  . Zoster Recombinat (Shingrix) 06/19/2019, 08/23/2019   Last Colon - 07/03/2009 - Dr Deatra Ina and recc 10 year f/u due Nov 2020.         - 10.05.2020 - Dr Loletha Carrow - poor prep - recc 1 year f/u colon Last EGD   -  10.05.2020 - Dr Loletha Carrow  - Neg Bx for  hx/o Barrett's - Recc 3 yr f/u     Past Surgical History:  Procedure Laterality Date  . CLOSED REDUCTION NASAL FRACTURE N/A 06/10/2016   Procedure: CLOSED REDUCTION NASAL FRACTURE FOREHEAD SUTURE REMOVAL;  Surgeon: [name], DO;  Location: [facility];  Service: Plastics;  Laterality: N/A;  . COLONOSCOPY    . [place]  . Left knee Arthroscopy    . Right knee arthroscopy    . UPPER GASTROINTESTINAL ENDOSCOPY     last egd 12-7*2012   Family History  Problem Relation Age of Onset  . Arthritis Mother   . Lung cancer Mother   . Heart disease Mother   . Colon cancer Maternal Uncle   . Stroke Brother   .  Prostate cancer Father   . Esophageal cancer Neg Hx   . Rectal cancer Neg Hx   . Stomach cancer Neg Hx   . Liver cancer Neg Hx   . Pancreatic cancer Neg Hx   . Colon polyps Neg Hx   . Crohn's disease Neg Hx    Social History   Socioeconomic History  . Marital status: Married    Spouse name: Malachy Mood  . Number of children: 1  Occupational History  . Occupation: Event organiser  Tobacco Use  . Smoking status:  Former Smoker    Years: 6.00    Types: Cigarettes    Quit date: 02/07/2013    Years since quitting: 6.7  . Smokeless tobacco: Never Used  Substance and Sexual Activity  . Alcohol use: No    Alcohol/week: 0.0 standard drinks  . Drug use: No  . Sexual activity: Yes     ROS Constitutional: Denies fever, chills, weight loss/gain, headaches, insomnia,  night sweats or change in appetite. Does c/o fatigue. Eyes: Denies redness, blurred vision, diplopia, discharge, itchy or watery eyes.  ENT: Denies discharge, congestion, post nasal drip, epistaxis, sore throat, earache, hearing loss, dental pain, Tinnitus, Vertigo, Sinus pain or snoring.  Cardio: Denies chest pain, palpitations, irregular heartbeat, syncope, dyspnea, diaphoresis, orthopnea, PND, claudication or edema Respiratory: denies cough, dyspnea, DOE, pleurisy, hoarseness, laryngitis or wheezing.  Gastrointestinal: Denies dysphagia, heartburn, reflux, water brash, pain, cramps, nausea, vomiting, bloating, diarrhea, constipation, hematemesis, melena, hematochezia, jaundice or hemorrhoids Genitourinary: Denies dysuria, frequency,discharge, hematuria or flank pain. Has urgency, nocturia x 2-3 & occasional hesitancy. Musculoskeletal: Denies arthralgia, myalgia, stiffness, Jt. Swelling, pain, limp or strain/sprain. Denies Falls. Skin: Denies puritis, rash, hives, warts, acne, eczema or change in skin lesion Neuro: No weakness, tremor, incoordination, spasms, paresthesia or pain Psychiatric: Denies confusion, memory loss or sensory  loss. Denies Depression. Endocrine: Denies change in weight, skin, hair change, nocturia, and paresthesia, diabetic polys, visual blurring or hyper / hypo glycemic episodes.  Heme/Lymph: No excessive bleeding, bruising or enlarged lymph nodes.  Physical Exam  BP 136/82   Pulse 64   Temp (!) 96.9 F (36.1 C)   Resp 18   Ht 6' (1.829 m)   Wt (!) 353 lb 6.4 oz (160.3 kg)   BMI 47.93 kg/m   General Appearance: Over nourished and well groomed and in no apparent distress.  Eyes: PERRLA, EOMs, conjunctiva no swelling or erythema, normal fundi and vessels. Sinuses: No frontal/maxillary tenderness ENT/Mouth: EACs patent / TMs  nl. Nares clear without erythema, swelling, mucoid exudates. Oral hygiene is good. No erythema, swelling, or exudate. Tongue normal, non-obstructing. Tonsils not swollen or erythematous. Hearing normal.  Neck: Supple, thyroid not palpable. No bruits, nodes or JVD. Respiratory: Respiratory effort normal.  BS equal and clear bilateral without rales, rhonci, wheezing or stridor. Cardio: Heart sounds are normal with regular rate and rhythm and no murmurs, rubs or gallops. Peripheral pulses are obscured by moderately severe pitting edema to the knees. Nl capillary refill.   Chest: symmetric with normal excursions and percussion.  Abdomen: Soft, Rotund with Nl bowel sounds. Nontender, no guarding, rebound, hernias, masses, or organomegaly.  Lymphatics: Non tender without lymphadenopathy.  Musculoskeletal: Full ROM all peripheral extremities, joint stability, 5/5 strength, and normal gait. Skin: Warm and dry without rashes, lesions, cyanosis, clubbing or  ecchymosis. Nodular eczematous rash of LE's  Rt >> Lt. Neuro: Cranial nerves intact, reflexes equal bilaterally. Normal muscle tone, no cerebellar symptoms. Sensation intact.  Pysch: Alert and oriented X 3 with normal affect, insight and judgment appropriate.   Assessment and Plan  1. Annual Preventative/Screening Exam    2. Essential hypertension  - EKG 12-Lead - [place], RETROPERITNL ABD,  LTD - Urinalysis, Routine w reflex microscopic - Microalbumin / creatinine urine ratio - CBC with Differential/Platelet - COMPLETE METABOLIC PANEL WITH GFR - Magnesium - TSH  3. Hyperlipidemia, mixed  - EKG 12-Lead - [place], RETROPERITNL ABD,  LTD - Lipid panel - TSH  4. Abnormal glucose  - EKG 12-Lead - [place], RETROPERITNL ABD,  LTD -  Hemoglobin A1c - Insulin, random  5. Vitamin D deficiency  - VITAMIN D 25 Hydroxy  6. Prediabetes  - EKG 12-Lead - [place], RETROPERITNL ABD,  LTD - Hemoglobin A1c - Insulin, random  7. Class 3 drug-induced obesity with serious comorbidity  and body mass index (BMI) of 45.0 to 49.9 in adult ([place])   8. Gastroesophageal reflux disease  - CBC with Differential/Platelet  9. Idiopathic gout  - Uric acid  10. BPH with obstruction/lower urinary tract symptoms  - PSA  11. Testosterone deficiency  - Testosterone  12. Prostate cancer screening  - PSA  13. Screening for ischemic heart disease  - EKG 12-Lead  14. FHx: heart disease  - EKG 12-Lead - [place], RETROPERITNL ABD,  LTD  15. Aorto-iliac atherosclerosis (HCC)  - [place], RETROPERITNL ABD,  LTD  16. Screening for AAA (aortic abdominal aneurysm)  - [place], RETROPERITNL ABD,  LTD  17. Screening examination for pulmonary tuberculosis  - TB Skin Test  18. Fatigue  - Iron,Total/Total Iron Binding Cap - Vitamin B12 - CBC with Differential/Platelet - TSH  19. Medication management  - Urinalysis, Routine w reflex microscopic - Microalbumin / creatinine urine ratio - Testosterone - Uric acid - CBC with Differential/Platelet - COMPLETE METABOLIC PANEL WITH GFR - Magnesium - Lipid panel - TSH - Hemoglobin A1c - Insulin, random - VITAMIN D 25 Hydroxy         Patient was counseled in prudent diet, weight control to help control his severe venous insufficiency which is felt consequent of his severe obesity.  Encouraged regular BP monitoring, regular exercise and medications as discussed.  Discussed med effects and SE's. Routine screening labs and tests as requested with regular follow-up as recommended. Over 40 minutes of exam, counseling, chart review and high complex critical decision making was performed   Kirtland Bouchard, MD

## 2019-11-20 NOTE — Patient Instructions (Signed)

## 2019-11-21 ENCOUNTER — Ambulatory Visit: Payer: 59 | Admitting: Internal Medicine

## 2019-11-21 ENCOUNTER — Other Ambulatory Visit: Payer: Self-pay

## 2019-11-21 ENCOUNTER — Encounter: Payer: Self-pay | Admitting: Internal Medicine

## 2019-11-21 VITALS — BP 136/82 | HR 64 | Temp 96.9°F | Resp 18 | Ht 72.0 in | Wt 353.4 lb

## 2019-11-21 DIAGNOSIS — R5383 Other fatigue: Secondary | ICD-10-CM

## 2019-11-21 DIAGNOSIS — I10 Essential (primary) hypertension: Secondary | ICD-10-CM | POA: Diagnosis not present

## 2019-11-21 DIAGNOSIS — I700 Atherosclerosis of aorta: Secondary | ICD-10-CM

## 2019-11-21 DIAGNOSIS — M1000 Idiopathic gout, unspecified site: Secondary | ICD-10-CM

## 2019-11-21 DIAGNOSIS — Z8249 Family history of ischemic heart disease and other diseases of the circulatory system: Secondary | ICD-10-CM | POA: Diagnosis not present

## 2019-11-21 DIAGNOSIS — E559 Vitamin D deficiency, unspecified: Secondary | ICD-10-CM

## 2019-11-21 DIAGNOSIS — E661 Drug-induced obesity: Secondary | ICD-10-CM

## 2019-11-21 DIAGNOSIS — E349 Endocrine disorder, unspecified: Secondary | ICD-10-CM

## 2019-11-21 DIAGNOSIS — Z0000 Encounter for general adult medical examination without abnormal findings: Secondary | ICD-10-CM | POA: Diagnosis not present

## 2019-11-21 DIAGNOSIS — Z6841 Body Mass Index (BMI) 40.0 and over, adult: Secondary | ICD-10-CM

## 2019-11-21 DIAGNOSIS — Z125 Encounter for screening for malignant neoplasm of prostate: Secondary | ICD-10-CM

## 2019-11-21 DIAGNOSIS — R7309 Other abnormal glucose: Secondary | ICD-10-CM

## 2019-11-21 DIAGNOSIS — Z87891 Personal history of nicotine dependence: Secondary | ICD-10-CM | POA: Diagnosis not present

## 2019-11-21 DIAGNOSIS — K219 Gastro-esophageal reflux disease without esophagitis: Secondary | ICD-10-CM

## 2019-11-21 DIAGNOSIS — Z111 Encounter for screening for respiratory tuberculosis: Secondary | ICD-10-CM

## 2019-11-21 DIAGNOSIS — Z136 Encounter for screening for cardiovascular disorders: Secondary | ICD-10-CM

## 2019-11-21 DIAGNOSIS — N138 Other obstructive and reflux uropathy: Secondary | ICD-10-CM

## 2019-11-21 DIAGNOSIS — Z0001 Encounter for general adult medical examination with abnormal findings: Secondary | ICD-10-CM

## 2019-11-21 DIAGNOSIS — Z79899 Other long term (current) drug therapy: Secondary | ICD-10-CM

## 2019-11-21 DIAGNOSIS — R7303 Prediabetes: Secondary | ICD-10-CM

## 2019-11-21 DIAGNOSIS — E782 Mixed hyperlipidemia: Secondary | ICD-10-CM

## 2019-11-21 DIAGNOSIS — N401 Enlarged prostate with lower urinary tract symptoms: Secondary | ICD-10-CM

## 2019-11-21 MED ORDER — FENOFIBRATE 160 MG PO TABS: ORAL_TABLET | ORAL | 3 refills | Status: DC

## 2019-11-21 MED ORDER — PHENTERMINE HCL 37.5 MG PO TABS: ORAL_TABLET | ORAL | 1 refills | Status: DC

## 2019-11-21 MED ORDER — TOPIRAMATE 25 MG PO TABS: ORAL_TABLET | ORAL | 3 refills | Status: DC

## 2019-11-22 LAB — URINALYSIS, ROUTINE W REFLEX MICROSCOPIC
Bilirubin Urine: NEGATIVE
Glucose, UA: NEGATIVE
Hgb urine dipstick: NEGATIVE
Ketones, ur: NEGATIVE
Leukocytes,Ua: NEGATIVE
Nitrite: NEGATIVE
Protein, ur: NEGATIVE
Specific Gravity, Urine: 1.022 (ref 1.001–1.03)
pH: 6 (ref 5.0–8.0)

## 2019-11-22 LAB — COMPLETE METABOLIC PANEL WITH GFR
AG Ratio: 1.6 (calc) (ref 1.0–2.5)
ALT: 22 U/L (ref 9–46)
AST: 20 U/L (ref 10–35)
Albumin: 4.5 g/dL (ref 3.6–5.1)
Alkaline phosphatase (APISO): 54 U/L (ref 35–144)
BUN: 16 mg/dL (ref 7–25)
CO2: 27 mmol/L (ref 20–32)
Calcium: 9.3 mg/dL (ref 8.6–10.3)
Chloride: 101 mmol/L (ref 98–110)
Creat: 1.15 mg/dL (ref 0.70–1.25)
GFR, Est African American: 79 mL/min/{1.73_m2} (ref >=60)
GFR, Est Non African American: 68 mL/min/{1.73_m2} (ref >=60)
Globulin: 2.9 g/dL (calc) (ref 1.9–3.7)
Glucose, Bld: 96 mg/dL (ref 65–99)
Potassium: 3.9 mmol/L (ref 3.5–5.3)
Sodium: 140 mmol/L (ref 135–146)
Total Bilirubin: 0.7 mg/dL (ref 0.2–1.2)
Total Protein: 7.4 g/dL (ref 6.1–8.1)

## 2019-11-22 LAB — CBC WITH DIFFERENTIAL/PLATELET
Absolute Monocytes: 871 cells/uL (ref 200–950)
Basophils Absolute: 43 cells/uL (ref 0–200)
Basophils Relative: 0.6 %
Eosinophils Absolute: 130 cells/uL (ref 15–500)
Eosinophils Relative: 1.8 %
HCT: 38.9 % (ref 38.5–50.0)
Hemoglobin: 13.4 g/dL (ref 13.2–17.1)
Lymphs Abs: 2282 cells/uL (ref 850–3900)
MCH: 31.4 pg (ref 27.0–33.0)
MCHC: 34.4 g/dL (ref 32.0–36.0)
MCV: 91.1 fL (ref 80.0–100.0)
MPV: 10.9 fL (ref 7.5–12.5)
Monocytes Relative: 12.1 %
Neutro Abs: 3874 cells/uL (ref 1500–7800)
Neutrophils Relative %: 53.8 %
Platelets: 133 10*3/uL — ABNORMAL LOW (ref 140–400)
RBC: 4.27 10*6/uL (ref 4.20–5.80)
RDW: 13.4 % (ref 11.0–15.0)
Total Lymphocyte: 31.7 %
WBC: 7.2 10*3/uL (ref 3.8–10.8)

## 2019-11-22 LAB — VITAMIN B12: Vitamin B-12: 422 pg/mL (ref 200–1100)

## 2019-11-22 LAB — LIPID PANEL
Cholesterol: 125 mg/dL (ref <200)
HDL: 40 mg/dL (ref >=40)
LDL Cholesterol (Calc): 69 mg/dL (calc)
Non-HDL Cholesterol (Calc): 85 mg/dL (calc) (ref <130)
Total CHOL/HDL Ratio: 3.1 (calc) (ref <5.0)
Triglycerides: 81 mg/dL (ref <150)

## 2019-11-22 LAB — MAGNESIUM: Magnesium: 1.8 mg/dL (ref 1.5–2.5)

## 2019-11-22 LAB — MICROALBUMIN / CREATININE URINE RATIO
Creatinine, Urine: 266 mg/dL (ref 20–320)
Microalb Creat Ratio: 6 mcg/mg creat (ref <30)
Microalb, Ur: 1.6 mg/dL

## 2019-11-22 LAB — HEMOGLOBIN A1C
Hgb A1c MFr Bld: 6 % of total Hgb — ABNORMAL HIGH (ref <5.7)
Mean Plasma Glucose: 126 (calc)
eAG (mmol/L): 7 (calc)

## 2019-11-22 LAB — IRON, TOTAL/TOTAL IRON BINDING CAP
%SAT: 32 % (calc) (ref 20–48)
Iron: 116 ug/dL (ref 50–180)
TIBC: 365 mcg/dL (calc) (ref 250–425)

## 2019-11-22 LAB — INSULIN, RANDOM: Insulin: 15.4 u[IU]/mL

## 2019-11-22 LAB — TESTOSTERONE: Testosterone: 552 ng/dL (ref 250–827)

## 2019-11-22 LAB — VITAMIN D 25 HYDROXY (VIT D DEFICIENCY, FRACTURES): Vit D, 25-Hydroxy: 70 ng/mL (ref 30–100)

## 2019-11-22 LAB — PSA: PSA: 0.6 ng/mL (ref <=4.0)

## 2019-11-22 LAB — TSH: TSH: 2.15 mIU/L (ref 0.40–4.50)

## 2019-11-22 LAB — URIC ACID: Uric Acid, Serum: 7.7 mg/dL (ref 4.0–8.0)

## 2020-01-13 ENCOUNTER — Other Ambulatory Visit: Payer: Self-pay | Admitting: Internal Medicine

## 2020-01-26 ENCOUNTER — Other Ambulatory Visit: Payer: Self-pay | Admitting: Internal Medicine

## 2020-01-26 DIAGNOSIS — R450 Nervousness: Secondary | ICD-10-CM

## 2020-02-19 ENCOUNTER — Other Ambulatory Visit: Payer: Self-pay | Admitting: Adult Health

## 2020-02-19 DIAGNOSIS — R450 Nervousness: Secondary | ICD-10-CM

## 2020-02-29 NOTE — Progress Notes (Signed)
FOLLOW UP  Assessment and Plan:   Atherosclerosis of aorta Per CT 12/2017 Control blood pressure, cholesterol, glucose, increase exercise.   Hypertension Well controlled with current medications - see edema notes for trial off of amlodipine with terazosin  Monitor blood pressure at home; patient to call if consistently greater than 130/80 Continue DASH diet.   Reminder to go to the ER if any CP, SOB, nausea, dizziness, severe HA, changes vision/speech, left arm numbness and tingling and jaw pain.  Cholesterol Continue rosuvastatin and fenofibrate Continue low cholesterol diet and exercise.  Check lipid panel.   Prediabetes Continue diet and exercise.  Perform daily foot/skin check, notify office of any concerning changes.  Check A1C q23m  Morbid obesity- BMI 48 with prediabetes, htn, hyperlipidemia Long discussion about weight loss, diet, and exercise Recommended diet heavy in fruits and veggies and low in animal meats, cheeses, and dairy products, appropriate calorie intake, portions Weigh weekly for progress monitoring; slow steady weight loss is goal  Discussed ideal weight for height, initial goal <300 lb Portion sizes and servings reviewed; information provided Will follow up in 3 months  Vitamin D Def Above goal at last visit; he has reduced dose to 4 days a week  continue supplementation to maintain goal of 60-100 Defer Vit D level  Gout Continue allopurinolDiet discussed Check uric acid as needed  Hypogonadism - continue to monitor, states medication is helping with symptoms of low T.   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   Bil LE edema R >L / chronic venous insufficiency  Asymmetrical R > L, persistent x 8 months, some improvement with  diuretic, did have negative DVT study 08/2019 On review hx chronic venous insufficiency/ edema per Dr. Melford Aase, he has discussed repeatedly need for weight loss to improve - working on with meds and lifestyle per above However, possible this was exacerbated to amlodipine which is new last year;- will stop x 2 weeks, given terazosin to take in the evenings instead (SE, risks discussed)  Close follow up 2 weeks  Consider referral to vascular due to severe baseline chronic edema if not improved Go to the ER if any chest pain, shortness of breath, nausea, dizziness, severe HA, changes vision/speech - elevate legs TID, increase activity, increase water, decrease sodium intake.  Wear compression socks more routinely if available. Return to the office if no change with symptoms. Check labs. - weight loss emphasized  Rash, R shin Per patient precedes edema, has had for many years, intermittently pruritic, was advised to avoid chronic steroids, another provider mentioned "eczema"  My suspicion remains that this is r/t venous stasis, however strong patient preference to try a topical for eczema but other than steroid, has done triamcinolone in the past Will give single scrip without refill for eucrisa to trial benefit with this after discussion  Continue diet and meds as discussed. Further disposition pending results of labs. Discussed med's effects and SE's.   Over 30 minutes of exam, counseling, chart review, and critical decision making was performed.   Future Appointments  Date Time Provider [place]  06/07/2020  9:30 AM Unk Pinto, MD [facility] None  11/28/2020  2:00 PM Unk Pinto, MD [facility] None    ----------------------------------------------------------------------------------------------------------------------  HPI 64 y.o. male  presents for 3 month follow up on hypertension, cholesterol, prediabetes, morbid obesity, hypogonadism, gout,  depression/anxiety and  vitamin D deficiency.   He is concerned about persistent asymmetrical swelling, ongoing since Oct 2020, has chronic edema bilaterally at baseline, R calf  has been persistently more swollen than left, not improving, did have negative DVT study in 08/2019; notably did NOT show evidence of chronic venous efficiency which has been suspected. Some improvement overnight except in R calf.   Denies pain, notable event/trauma prior to onset, fever/chills, calf pain, wound. Denies personal history of clots. Denies knowledge of clotting disorder in family. Has increased lasix 20 to 40 mg BID and TID with some improvement temporarily but was unable to tolerate 40 mg TID due to cramps and now back to taking 40 mg BID. Has done compression with some benefit but overall sx are fairly severe, having trouble getting legs in pant legs and frustrated.   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 Has had normal CBC, CMP, TSH, UA, microalbumin since onset.   he has a diagnosis of depression/anxiety and is currently on wellbutrin daily and xanax 0.5-1 mg TID PRN, reports symptoms are well controlled on current regimen. he currently reports taking mainly for sleep, takes 1/2 tab 2-3 days week, has reduced use.   Patient has GERD / Barrett's (+ Bx) on Nexium & followed by Dr Loletha Carrow, next EGD due 2023.  he is prescribed phentermine/topiramate for weight loss. He reports had some urinary retention with topamax and stopped taking. He feels phentermine is beneficial, curbs his appetite, thinks current weight is related to fluid/ecema. While on the medication they have lost 0 lbs since last visit. They deny palpitations, anxiety, trouble sleeping, elevated BP.   BMI is Body mass index is 48.15 kg/m., he is working on diet and exercise.  He admits hasn't been  walking, stopped after he was feeling bad for 2 months after taking covid 19 vaccine, working to get back up to 2-3 miles daily.  He does watch diet, has cut back late night eating, he is very surprised with weight gain this visit. He wonders if this may be fluid.  Not currently weighing.  Wt Readings from Last 3 Encounters:  03/01/20 (!) 355 lb (161 kg)  11/21/19 (!) 353 lb 6.4 oz (160.3 kg)  08/17/19 (!) 341 lb (154.7 kg)   He has aorto-iliac atherosclerosis per CT 01/08/2018 His blood pressure has been controlled at home, today their BP is BP: 122/84  He does workout. He denies chest pain, shortness of breath, dizziness.   He is on cholesterol medication (fenofibrate, rosuvastatin 20 mg daily) and denies myalgias. His cholesterol is at goal. The cholesterol last visit was:   Lab Results  Component Value Date   CHOL 125 11/21/2019   HDL 40 11/21/2019   LDLCALC 69 11/21/2019   TRIG 81 11/21/2019   CHOLHDL 3.1 11/21/2019    He has been working on diet and exercise for prediabetes, and denies foot ulcerations, increased appetite, nausea, paresthesia of the feet, polydipsia, polyuria, visual disturbances, vomiting and weight loss. Last A1C in the office was:  Lab Results  Component Value Date   HGBA1C 6.0 (H) 11/21/2019   Patient is on Vitamin D supplement and at goal at recent check:    Lab Results  Component Value Date   VD25OH 70 11/21/2019     Patient is on allopurinol  for gout and does not report a recent flare.  Lab Results  Component Value Date   LABURIC 7.7 11/21/2019   He has a history of testosterone deficiency and is on testosterone replacement (gel). He states that the testosterone helps with his energy, libido, muscle mass. Lab Results  Component Value Date   TESTOSTERONE 552 11/21/2019    Current Medications:  Current Outpatient Medications on File Prior to Visit  Medication Sig  . 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  allopurinol (ZYLOPRIM) 300 MG tablet Take 1 tablet Daily to Prevent Gout  . ALPRAZolam (XANAX) 1 MG tablet TAKE 1/2 TO 1 TABLET BY MOUTH 2 TO 3 TIMES DAILY ONLY IF NEEDED FOR ANXIETY ATTACK AND LIMIT TO 5 DAYS A WEEK TO AVOID ADDICTION AND DEMENTIA  . amLODipine (NORVASC) 10 MG tablet Take 1 tablet every night for BP  . aspirin EC 81 MG tablet Take 81 mg by mouth daily.  Marland Kitchen atenolol (TENORMIN) 100 MG tablet TAKE 1 TABLET BY MOUTH  DAILY FOR BLOOD PRESSURE  . buPROPion (WELLBUTRIN XL) 300 MG 24 hr tablet Take 1 table tevery Morning for Mood, Focus & Concentration  . Cyanocobalamin (VITAMIN B 12 PO) Take 1 tablet by mouth daily.    Marland Kitchen esomeprazole (NEXIUM) 40 MG capsule Take 1 capsule 2 x /day for GERD & Barrett's Esophagus  . fenofibrate 160 MG tablet Take 1 tablet Daily for Triglycerides  . fish oil-omega-3 fatty acids 1000 MG capsule Take 1 g by mouth daily.   . Flaxseed, Linseed, (FLAX SEEDS PO) Take by mouth.  . furosemide (LASIX) 40 MG tablet Take 1 tablet 2 x/day for BP & Fluid  . magnesium oxide (MAG-OX) 400 MG tablet Take 400 mg by mouth 2 (two) times daily.   . methocarbamol (ROBAXIN) 500 MG tablet Take 1 tablet at bedtime for Muscle Spasm.  . montelukast (SINGULAIR) 10 MG tablet Take 1 tablet daily for Allergies & Asthma  . Multiple Vitamin (MULTIVITAMIN WITH MINERALS) TABS tablet Take 1 tablet by mouth daily.  . phentermine (ADIPEX-P) 37.5 MG tablet Take 1/2 to 1 tablet every morning for Dieting & Weight Loss  . potassium chloride (K-DUR) 10 MEQ tablet Take 1 tablet (10 mEq total) by mouth 3 (three) times daily.  . rosuvastatin (CRESTOR) 20 MG tablet TAKE ONE TABLET BY MOUTH DAILY FOR CHOLESTEROL  . tadalafil (CIALIS) 20 MG tablet Take 1/2 to 1 tablet every 2 to 3 days as needed for XXXX  . testosterone (TESTIM) 50 MG/5GM (1%) GEL Apply 1 Testosterone gel tube Daily  . valsartan (DIOVAN) 320 MG tablet Take 1 tablet every morning for BP  . vitamin C (ASCORBIC ACID) 500 MG tablet Take 500  mg by mouth daily.   . Vitamin D, Ergocalciferol, (DRISDOL) 1.25 MG (50000 UT) CAPS capsule Take 1 capsule Daily for severe Vit D  Deficiency  . Zinc 50 MG TABS Take by mouth daily.  Marland Kitchen topiramate (TOPAMAX) 25 MG tablet Take 1/2 to 1 tablet 2 x /day  at Suppertime & Bedtime for Dieting & Weight Loss (Patient not taking: Reported on 03/01/2020)   No current facility-administered medications on file prior to visit.     Allergies: No Known Allergies   Medical History:  Past Medical History:  Diagnosis Date  . Allergy   . Anxiety   . Asthma    as a child  . Barrett's esophagus   . GERD (gastroesophageal reflux disease)   .  Gout    never had a flare up of gout  . H/O hiatal hernia   . Hyperlipidemia   . Hypertension   . Prediabetes   . Sleep apnea    does not use  cpap  . Vitamin D deficiency    Family history- Reviewed and unchanged Social history- Reviewed and unchanged   Review of Systems:  Review of Systems  Constitutional: Negative for malaise/fatigue and weight loss.  HENT: Negative for hearing loss and tinnitus.   Eyes: Negative for blurred vision and double vision.  Respiratory: Negative for cough, shortness of breath and wheezing.   Cardiovascular: Positive for leg swelling (bil, chronic, recently worse R>L). Negative for chest pain, palpitations, orthopnea and claudication.  Gastrointestinal: Negative for abdominal pain, blood in stool, constipation, diarrhea, heartburn, melena, nausea and vomiting.  Genitourinary: Negative.   Musculoskeletal: Negative for back pain, falls, joint pain and myalgias.  Skin: Negative for rash.  Neurological: Negative for dizziness, tingling, sensory change, weakness and headaches.  Endo/Heme/Allergies: Negative for polydipsia.  Psychiatric/Behavioral: Negative.   All other systems reviewed and are negative.   Physical Exam: BP 122/84   Pulse 70   Temp (!) 96.8 F (36 C)   Wt (!) 355 lb (161 kg)   SpO2 96%   BMI 48.15 kg/m   Wt Readings from Last 3 Encounters:  03/01/20 (!) 355 lb (161 kg)  11/21/19 (!) 353 lb 6.4 oz (160.3 kg)  08/17/19 (!) 341 lb (154.7 kg)   General Appearance: Well nourished,morbidly obese patient, in no apparent distress. Eyes: PERRLA, EOMs, conjunctiva no swelling or erythema Sinuses: No Frontal/maxillary tenderness ENT/Mouth: Ext aud canals clear, TMs without erythema, bulging. Mask in place; oral exam deferred in light of pandemic and no concerns. Hearing normal.  Neck: Supple, thyroid normal.  Respiratory: Respiratory effort normal, BS equal bilaterally without rales, rhonchi, wheezing or stridor.  Cardio: RRR with no MRGs. Significant nonpitting edema of bilateral ankles and lower legs and calves, neg homan's, non-tender, pulses limited due to edema  Abdomen: Soft, morbidly obese abdomen limiting exam; + BS.  Non tender, no guarding, rebound, palpable hernias or masses. Lymphatics: Non tender without lymphadenopathy.  Musculoskeletal: Full ROM, 5/5 strength, Normal gait Skin: Warm, dry without rashes, lesions, ecchymosis. rippled texture and some brown discoloration at R lower shin (per patient precedes edema for many years) Neuro: Cranial nerves intact. No cerebellar symptoms.  Psych: Awake and oriented X 3, normal affect, Insight and Judgment appropriate.   Izora Ribas, NP 9:10 AM [facility]

## 2020-03-01 ENCOUNTER — Ambulatory Visit: Payer: 59 | Admitting: Adult Health

## 2020-03-01 ENCOUNTER — Other Ambulatory Visit: Payer: Self-pay

## 2020-03-01 ENCOUNTER — Encounter: Payer: Self-pay | Admitting: Adult Health

## 2020-03-01 VITALS — BP 122/84 | HR 70 | Temp 96.8°F | Wt 355.0 lb

## 2020-03-01 DIAGNOSIS — I10 Essential (primary) hypertension: Secondary | ICD-10-CM | POA: Diagnosis not present

## 2020-03-01 DIAGNOSIS — M1000 Idiopathic gout, unspecified site: Secondary | ICD-10-CM

## 2020-03-01 DIAGNOSIS — F419 Anxiety disorder, unspecified: Secondary | ICD-10-CM

## 2020-03-01 DIAGNOSIS — E559 Vitamin D deficiency, unspecified: Secondary | ICD-10-CM

## 2020-03-01 DIAGNOSIS — I700 Atherosclerosis of aorta: Secondary | ICD-10-CM | POA: Diagnosis not present

## 2020-03-01 DIAGNOSIS — E782 Mixed hyperlipidemia: Secondary | ICD-10-CM

## 2020-03-01 DIAGNOSIS — R600 Localized edema: Secondary | ICD-10-CM

## 2020-03-01 DIAGNOSIS — K219 Gastro-esophageal reflux disease without esophagitis: Secondary | ICD-10-CM

## 2020-03-01 DIAGNOSIS — R7309 Other abnormal glucose: Secondary | ICD-10-CM

## 2020-03-01 DIAGNOSIS — Z79899 Other long term (current) drug therapy: Secondary | ICD-10-CM

## 2020-03-01 DIAGNOSIS — R7303 Prediabetes: Secondary | ICD-10-CM

## 2020-03-01 DIAGNOSIS — I708 Atherosclerosis of other arteries: Secondary | ICD-10-CM

## 2020-03-01 DIAGNOSIS — F3341 Major depressive disorder, recurrent, in partial remission: Secondary | ICD-10-CM

## 2020-03-01 DIAGNOSIS — E349 Endocrine disorder, unspecified: Secondary | ICD-10-CM

## 2020-03-01 LAB — CBC WITH DIFFERENTIAL/PLATELET
Absolute Monocytes: 866 cells/uL (ref 200–950)
Basophils Absolute: 21 cells/uL (ref 0–200)
Basophils Relative: 0.3 %
Eosinophils Absolute: 199 cells/uL (ref 15–500)
Eosinophils Relative: 2.8 %
HCT: 40.4 % (ref 38.5–50.0)
Hemoglobin: 13.5 g/dL (ref 13.2–17.1)
Lymphs Abs: 2173 cells/uL (ref 850–3900)
MCH: 30.8 pg (ref 27.0–33.0)
MCHC: 33.4 g/dL (ref 32.0–36.0)
MCV: 92.2 fL (ref 80.0–100.0)
MPV: 10.5 fL (ref 7.5–12.5)
Monocytes Relative: 12.2 %
Neutro Abs: 3841 cells/uL (ref 1500–7800)
Neutrophils Relative %: 54.1 %
Platelets: 158 10*3/uL (ref 140–400)
RBC: 4.38 10*6/uL (ref 4.20–5.80)
RDW: 13.4 % (ref 11.0–15.0)
Total Lymphocyte: 30.6 %
WBC: 7.1 10*3/uL (ref 3.8–10.8)

## 2020-03-01 LAB — LIPID PANEL
Cholesterol: 171 mg/dL (ref <200)
HDL: 35 mg/dL — ABNORMAL LOW (ref >=40)
LDL Cholesterol (Calc): 114 mg/dL (calc) — ABNORMAL HIGH
Non-HDL Cholesterol (Calc): 136 mg/dL (calc) — ABNORMAL HIGH (ref <130)
Total CHOL/HDL Ratio: 4.9 (calc) (ref <5.0)
Triglycerides: 108 mg/dL (ref <150)

## 2020-03-01 LAB — COMPLETE METABOLIC PANEL WITH GFR
AG Ratio: 1.6 (calc) (ref 1.0–2.5)
ALT: 21 U/L (ref 9–46)
AST: 20 U/L (ref 10–35)
Albumin: 4.5 g/dL (ref 3.6–5.1)
Alkaline phosphatase (APISO): 60 U/L (ref 35–144)
BUN: 16 mg/dL (ref 7–25)
CO2: 28 mmol/L (ref 20–32)
Calcium: 9.4 mg/dL (ref 8.6–10.3)
Chloride: 105 mmol/L (ref 98–110)
Creat: 1.12 mg/dL (ref 0.70–1.25)
GFR, Est African American: 81 mL/min/{1.73_m2} (ref >=60)
GFR, Est Non African American: 70 mL/min/{1.73_m2} (ref >=60)
Globulin: 2.9 g/dL (calc) (ref 1.9–3.7)
Glucose, Bld: 106 mg/dL — ABNORMAL HIGH (ref 65–99)
Potassium: 4.2 mmol/L (ref 3.5–5.3)
Sodium: 137 mmol/L (ref 135–146)
Total Bilirubin: 0.6 mg/dL (ref 0.2–1.2)
Total Protein: 7.4 g/dL (ref 6.1–8.1)

## 2020-03-01 LAB — MAGNESIUM: Magnesium: 1.9 mg/dL (ref 1.5–2.5)

## 2020-03-01 LAB — TSH: TSH: 1.77 mIU/L (ref 0.40–4.50)

## 2020-03-01 MED ORDER — EUCRISA 2 % EX OINT: 1.0000 "application " | TOPICAL_OINTMENT | Freq: Two times a day (BID) | CUTANEOUS | 0 refills | Status: DC

## 2020-03-01 MED ORDER — TERAZOSIN HCL 2 MG PO CAPS: 2.0000 mg | ORAL_CAPSULE | Freq: Every day | ORAL | 0 refills | Status: DC

## 2020-03-01 NOTE — Patient Instructions (Signed)
Goals    . Weight (lb) < 300 lb (136.1 kg)      Restart walking  Compression hose daily  Stop amlodipine - start terazosin/hytrin daily at bedtime   Follow up 2 weeks    Edema  Edema is when you have too much fluid in your body or under your skin. Edema may make your legs, feet, and ankles swell up. Swelling is also common in looser tissues, like around your eyes. This is a common condition. It gets more common as you get older. There are many possible causes of edema. Eating too much salt (sodium) and being on your feet or sitting for a long time can cause edema in your legs, feet, and ankles. Hot weather may make edema worse. Edema is usually painless. Your skin may look swollen or shiny. Follow these instructions at home:  Keep the swollen body part raised (elevated) above the level of your heart when you are sitting or lying down.  Do not sit still or stand for a long time.  Do not wear tight clothes. Do not wear garters on your upper legs.  Exercise your legs. This can help the swelling go down.  Wear elastic bandages or support stockings as told by your doctor.  Eat a low-salt (low-sodium) diet to reduce fluid as told by your doctor.  Depending on the cause of your swelling, you may need to limit how much fluid you drink (fluid restriction).  Take over-the-counter and prescription medicines only as told by your doctor. Contact a doctor if:  Treatment is not working.  You have heart, liver, or kidney disease and have symptoms of edema.  You have sudden and unexplained weight gain. Get help right away if:  You have shortness of breath or chest pain.  You cannot breathe when you lie down.  You have pain, redness, or warmth in the swollen areas.  You have heart, liver, or kidney disease and get edema all of a sudden.  You have a fever and your symptoms get worse all of a sudden. Summary  Edema is when you have too much fluid in your body or under your  skin.  Edema may make your legs, feet, and ankles swell up. Swelling is also common in looser tissues, like around your eyes.  Raise (elevate) the swollen body part above the level of your heart when you are sitting or lying down.  Follow your doctor's instructions about diet and how much fluid you can drink (fluid restriction). This information is not intended to replace advice given to you by your health care provider. Make sure you discuss any questions you have with your health care provider. Document Revised: 08/21/2017 Document Reviewed: 09/05/2016 Elsevier Patient Education  2020 [employer].

## 2020-03-02 ENCOUNTER — Other Ambulatory Visit: Payer: Self-pay | Admitting: Adult Health

## 2020-03-02 MED ORDER — ROSUVASTATIN CALCIUM 40 MG PO TABS: ORAL_TABLET | ORAL | 3 refills | Status: DC

## 2020-03-15 ENCOUNTER — Ambulatory Visit: Payer: 59 | Admitting: Adult Health

## 2020-03-15 NOTE — Progress Notes (Deleted)
FOLLOW UP  Assessment and Plan:   Atherosclerosis of aorta Per CT 12/2017 Control blood pressure, cholesterol, glucose, increase exercise.   Hypertension Well controlled with current medications - see edema notes for trial off of amlodipine with terazosin *** Monitor blood pressure at home; patient to call if consistently greater than 130/80 Continue DASH diet.   Reminder to go to the ER if any CP, SOB, nausea, dizziness, severe HA, changes vision/speech, left arm numbness and tingling and jaw pain.  Morbid obesity- BMI 48 with prediabetes, htn, hyperlipidemia Long discussion about weight loss, diet, and exercise Recommended diet heavy in fruits and veggies and low in animal meats, cheeses, and dairy products, appropriate calorie intake, portions Weigh weekly for progress monitoring; slow steady weight loss is goal  Discussed ideal weight for height, initial goal <300 lb Portion sizes and servings reviewed; information provided Will follow up in 3 months  Bil LE edema R >L / chronic venous insufficiency  Asymmetrical R > L, persistent x 8 months, some improvement with diuretic, did have negative DVT study 08/2019 On review hx chronic venous insufficiency/ edema per Dr. Melford Aase, he has discussed repeatedly need for weight loss to improve - working on with meds and lifestyle per above However, possible this was exacerbated to amlodipine which is new last year;- will stop x 2 weeks, given terazosin to take in the evenings instead (SE, risks discussed)  Close follow up 2 weeks  Consider referral to vascular due to severe baseline chronic edema if not improved Go to the ER if any chest pain, shortness of breath, nausea, dizziness, severe HA, changes vision/speech - elevate legs TID, increase activity, increase water, decrease sodium intake.  Wear compression socks more routinely if available. Return to the office if no change with symptoms. Check labs. - weight loss emphasized  Rash, R  shin Per patient precedes edema, has had for many years, intermittently pruritic, was advised to avoid chronic steroids, another provider mentioned "eczema"  My suspicion remains that this is r/t venous stasis, however strong patient preference to try a topical for eczema but other than steroid, has done triamcinolone in the past Will give single scrip without refill for eucrisa to trial benefit with this after discussion  Continue diet and meds as discussed. Further disposition pending results of labs. Discussed med's effects and SE's.   Over 30 minutes of exam, counseling, chart review, and critical decision making was performed.   Future Appointments  Date Time Provider [place]  03/16/2020  9:30 AM Liane Comber, NP [facility] None  06/07/2020  9:30 AM Unk Pinto, MD [facility] None  11/28/2020  2:00 PM Unk Pinto, MD [facility] None    ----------------------------------------------------------------------------------------------------------------------  HPI 64 y.o. male  presents for 2 week follow up on hypertension,  morbid obesity, peripheral edema.   He is concerned about persistent asymmetrical swelling, ongoing since Oct 2020, has chronic edema bilaterally at baseline, R calf  has been persistently more swollen than left recently, not improving, did have negative DVT study in 08/2019; notably did NOT show evidence of chronic venous insufficiency which has been suspected. Some improvement overnight except in R calf.   Denies pain, notable event/trauma prior to onset, fever/chills, calf pain, wound. Denies personal history of clots. Denies knowledge of clotting disorder in family. Has increased lasix 20 to 40 mg BID and TID with some improvement temporarily but was unable to tolerate 40 mg TID due to cramps and now back to taking 40 mg BID. Has done compression with  some benefit but overall sx are fairly severe, having trouble getting legs in pant legs and  frustrated.   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 Has had normal CBC, CMP, TSH, UA, microalbumin since onset.   ***   he is prescribed phentermine/topiramate for weight loss. He reports had some urinary retention with topamax and stopped taking. He feels phentermine is beneficial, curbs his appetite, thinks current weight is related to fluid/ecema. While on the medication they have lost 0 lbs since last visit. They deny palpitations, anxiety, trouble sleeping, elevated BP.   BMI is There is no height or weight on file to calculate BMI., he is working on diet and exercise.  He admits hasn't been walking, stopped after he was feeling bad for 2 months after taking covid 19 vaccine, working to get back up to 2-3 miles daily.  He does watch diet, has cut back late night eating, he is very surprised with weight gain this visit. He wonders if this may be fluid.  Not currently weighing.  Wt Readings from Last 3 Encounters:  03/01/20 (!) 355 lb (161 kg)  11/21/19 (!) 353 lb 6.4 oz (160.3 kg)  08/17/19 (!) 341 lb (154.7 kg)   He has aorto-iliac atherosclerosis per CT 01/08/2018 His blood pressure has been controlled at home, today their BP is    He does workout. He denies chest pain, shortness of breath, dizziness.    Current Medications:  Current Outpatient Medications on File Prior to Visit  Medication Sig  . 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 allopurinol (ZYLOPRIM) 300 MG tablet Take 1 tablet Daily to Prevent Gout  . ALPRAZolam (XANAX) 1 MG tablet TAKE 1/2 TO 1 TABLET BY MOUTH 2 TO 3 TIMES DAILY ONLY IF NEEDED FOR ANXIETY ATTACK AND LIMIT TO 5 DAYS A WEEK TO AVOID ADDICTION AND DEMENTIA  . aspirin EC 81 MG tablet Take 81 mg by mouth daily.  Marland Kitchen atenolol (TENORMIN) 100 MG tablet  TAKE 1 TABLET BY MOUTH  DAILY FOR BLOOD PRESSURE  . buPROPion (WELLBUTRIN XL) 300 MG 24 hr tablet Take 1 table tevery Morning for Mood, Focus & Concentration  . Crisaborole (EUCRISA) 2 % OINT Apply 1 application topically 2 (two) times daily.  . Cyanocobalamin (VITAMIN B 12 PO) Take 1 tablet by mouth daily.    Marland Kitchen esomeprazole (NEXIUM) 40 MG capsule Take 1 capsule 2 x /day for GERD & Barrett's Esophagus  . fenofibrate 160 MG tablet Take 1 tablet Daily for Triglycerides  . fish oil-omega-3 fatty acids 1000 MG capsule Take 1 g by mouth daily.   . Flaxseed, Linseed, (FLAX SEEDS PO) Take by mouth.  . furosemide (LASIX) 40 MG tablet Take 1 tablet 2 x/day for BP & Fluid  . magnesium oxide (MAG-OX) 400 MG tablet Take 400 mg by mouth 2 (two) times daily.   . methocarbamol (ROBAXIN) 500 MG tablet Take 1 tablet at bedtime for Muscle Spasm.  . montelukast (SINGULAIR) 10 MG tablet Take 1 tablet daily for Allergies & Asthma  . Multiple Vitamin (MULTIVITAMIN WITH MINERALS) TABS tablet Take 1 tablet by mouth daily.  . phentermine (ADIPEX-P) 37.5 MG tablet Take 1/2 to 1 tablet every morning for Dieting & Weight Loss  . potassium chloride (K-DUR) 10 MEQ  tablet Take 1 tablet (10 mEq total) by mouth 3 (three) times daily.  . rosuvastatin (CRESTOR) 40 MG tablet TAKE ONE TABLET BY MOUTH DAILY FOR CHOLESTEROL  . tadalafil (CIALIS) 20 MG tablet Take 1/2 to 1 tablet every 2 to 3 days as needed for XXXX  . terazosin (HYTRIN) 2 MG capsule Take 1 capsule (2 mg total) by mouth at bedtime.  Marland Kitchen testosterone (TESTIM) 50 MG/5GM (1%) GEL Apply 1 Testosterone gel tube Daily  . valsartan (DIOVAN) 320 MG tablet Take 1 tablet every morning for BP  . vitamin C (ASCORBIC ACID) 500 MG tablet Take 500 mg by mouth daily.   . Vitamin D, Ergocalciferol, (DRISDOL) 1.25 MG (50000 UT) CAPS capsule Take 1 capsule Daily for severe Vit D  Deficiency  . Zinc 50 MG TABS Take by mouth daily.   No current facility-administered medications on file  prior to visit.     Allergies:  Allergies  Allergen Reactions  . Topiramate     Urinary retention     Medical History:  Past Medical History:  Diagnosis Date  . Allergy   . Anxiety   . Asthma    as a child  . Barrett's esophagus   . GERD (gastroesophageal reflux disease)   . Gout    never had a flare up of gout  . H/O hiatal hernia   . Hyperlipidemia   . Hypertension   . Prediabetes   . Sleep apnea    does not use  cpap  . Vitamin D deficiency    Family history- Reviewed and unchanged Social history- Reviewed and unchanged   Review of Systems:  Review of Systems  Constitutional: Negative for malaise/fatigue and weight loss.  HENT: Negative for hearing loss and tinnitus.   Eyes: Negative for blurred vision and double vision.  Respiratory: Negative for cough, shortness of breath and wheezing.   Cardiovascular: Positive for leg swelling (bil, chronic, recently worse R>L). Negative for chest pain, palpitations, orthopnea and claudication.  Gastrointestinal: Negative for abdominal pain, blood in stool, constipation, diarrhea, heartburn, melena, nausea and vomiting.  Genitourinary: Negative.   Musculoskeletal: Negative for back pain, falls, joint pain and myalgias.  Skin: Negative for rash.  Neurological: Negative for dizziness, tingling, sensory change, weakness and headaches.  Endo/Heme/Allergies: Negative for polydipsia.  Psychiatric/Behavioral: Negative.   All other systems reviewed and are negative.   Physical Exam: There were no vitals taken for this visit. Wt Readings from Last 3 Encounters:  03/01/20 (!) 355 lb (161 kg)  11/21/19 (!) 353 lb 6.4 oz (160.3 kg)  08/17/19 (!) 341 lb (154.7 kg)   General Appearance: Well nourished,morbidly obese patient, in no apparent distress. Eyes: PERRLA, EOMs, conjunctiva no swelling or erythema Sinuses: No Frontal/maxillary tenderness ENT/Mouth: Ext aud canals clear, TMs without erythema, bulging. Mask in place; oral  exam deferred in light of pandemic and no concerns. Hearing normal.  Neck: Supple, thyroid normal.  Respiratory: Respiratory effort normal, BS equal bilaterally without rales, rhonchi, wheezing or stridor.  Cardio: RRR with no MRGs. Significant nonpitting edema of bilateral ankles and lower legs and calves, neg homan's, non-tender, pulses limited due to edema  Abdomen: Soft, morbidly obese abdomen limiting exam; + BS.  Non tender, no guarding, rebound, palpable hernias or masses. Lymphatics: Non tender without lymphadenopathy.  Musculoskeletal: Full ROM, 5/5 strength, Normal gait Skin: Warm, dry without rashes, lesions, ecchymosis. rippled texture and some brown discoloration at R lower shin (per patient precedes edema for many years) *** Neuro: Cranial nerves  intact. No cerebellar symptoms.  Psych: Awake and oriented X 3, normal affect, Insight and Judgment appropriate.   Christopher Ribas, NP 1:17 PM [facility]

## 2020-03-16 ENCOUNTER — Ambulatory Visit: Payer: 59 | Admitting: Adult Health

## 2020-03-16 NOTE — Progress Notes (Signed)
FOLLOW UP  Assessment and Plan:   Hypertension Well controlled with current medications - improved edema off of amlodipine - continue current meds Monitor blood pressure at home; patient to call if consistently greater than 130/80 Continue DASH diet.   Reminder to go to the ER if any CP, SOB, nausea, dizziness, severe HA, changes vision/speech, left arm numbness and tingling and jaw pain.  Morbid obesity- BMI 48 with prediabetes, htn, hyperlipidemia, severe peripheral edema Long discussion about weight loss, diet, and exercise Recommended diet heavy in fruits and veggies and low in animal meats, cheeses, and dairy products, appropriate calorie intake, portions, encouraged bean intake  Weigh weekly for progress monitoring; slow steady weight loss is goal  Discussed ideal weight for height, initial goal <350 lb Portion sizes and servings reviewed; information provided He seems to be following a reasonable diet, not excessive calories, phentermine has helped with appetite. Discussed Mancel Parsons today but declines at this time Working on increasing exercise back up to 30-60 min daily  Weight gain despite reasonable lifestyle/dietary improvements, doesn't sound like excess calorie intake ? Sleep apnea or other condition limiting, will pursue sleep study  Will follow up in 3 months  Bil LE edema / venous stasis Chronic, worse in last year, some improvement with diuretic, did have negative DVT study 08/2019 due to some R >L, improved off of of amlodipine, improves with increased ambulation, reports compliance with compression On review hx chronic venous insufficiency/ edema per Dr. Melford [name], he has discussed repeatedly need for weight loss to improve - working on with meds and lifestyle per above Patient preference to proceed with referral to vascular due to severe baseline chronic edema affecting QOL - referral placed today after discussion Denies sx of CHF though consider ECHO if unable to tolerate  attempts to increase exercise - elevate legs TID, increase activity, increase water, decrease sodium intake.  Wear compression socks more routinely if available. Return to the office if no change with symptoms. Recent labs have been normal  Snoring/ at risk for sleep apnea Unexplained difficulty losing weight likely contributing to above edema; has snoring, some apneic episodes, recently more AM HA and non-restorative sleep in morbidly obese; will refer to Dr. Brett [name] for sleep study and recommendations   Continue diet and meds as discussed. Further disposition pending results of labs. Discussed med's effects and SE's.   Over 30 minutes of exam, counseling, chart review, and critical decision making was performed.   Future Appointments  Date Time Provider [place]  06/07/2020  9:30 AM Unk Pinto, MD [facility] None  11/28/2020  2:00 PM Unk Pinto, MD [facility] None    ----------------------------------------------------------------------------------------------------------------------  HPI 64 y.o. male  presents for 2 week follow up on hypertension,  morbid obesity, peripheral edema.   He is concerned about persistent leg swelling, worse since Oct 2020, has chronic edema bilaterally at baseline, new edema was R > L, not improving, did have negative DVT study in 08/2019; notably did NOT show evidence of chronic venous insufficiency which has been suspected previously. He was reporting some improvement overnight at previous visits, today reports worse overnight and improves with increased ambulation. Has had normal CBC, CMP, TSH, UA, microalbumin since onset. Denies fatigue, dyspnea or PND concerning for CHF.   On review last visit he was very frustrated, was doing compression with some benefit but overall severe/persistent sx, having trouble getting legs in work pants. He is taking lasix 40 mg BID with some improvement, was unable to tolerate TID with cramps  though never  had abnormal electrolytes. At last visit, it was noted he was newly on amlodipine in the last year; this was discontinued - he reports did see improvement, 1 week after stopping amlodipine has noted his casual pants aren't as tight. He is pleased with progress, but today still requesting referral to vascular for further discussion.   Have had numerous discussions in the past, morbid obesity with abdominal obesity is likely significant contributor; he has been working on weight loss. He had CT abd/pelv in 12/2017 which was benign other than small suspected fat containing inguinal hernias, small hiatal hernias. He had   he is prescribed phentermine for weight loss. He reports had some urinary retention with topamax and stopped taking. He feels phentermine is beneficial, curbs his appetite, thinks current weight is related to fluid/edema. While on the medication they have lost 0 lbs since last visit. They deny palpitations, anxiety, elevated BP. Discussed Wegovy (GLP-1i) today, he is not interested at this time.   BMI is Body mass index is 48.69 kg/m., he is working on diet and exercise.  He reported had stopped walking after he was feeling bad for 2 months following getting covid 19 vaccine, now on treadmill doing 15 min, planning to work back up to 30 min daily, used to do this BID  He does watch diet, has cut back late night eating, he is very surprised with weight gain this visit, eating more salads than anything, will do a bag of frozen veggies in the evening 8 bottles of water daily  Admits not currently weighing.  Sleep: poor, snoring, does occasionally have AM headaches/ non-restorative sleep, having more in recent months, wife has seen some apneic episdoes Wt Readings from Last 3 Encounters:  03/19/20 (!) 359 lb (162.8 kg)  03/01/20 (!) 355 lb (161 kg)  11/21/19 (!) 353 lb 6.4 oz (160.3 kg)   Meals yesterday:  Breakfast: honey nut cheerios (1.5-2 cup), skim milk Lunch: [employer] killer bread  tuna sandwich  Dinner: same Jordan sandwich  He has aorto-iliac atherosclerosis per CT 01/08/2018 His blood pressure has been controlled at home, today their BP is BP: 122/78  He does workout. He denies chest pain, shortness of breath, dizziness.    Current Medications:  Current Outpatient Medications on File Prior to Visit  Medication Sig  . 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 allopurinol (ZYLOPRIM) 300 MG tablet Take 1 tablet Daily to Prevent Gout  . ALPRAZolam (XANAX) 1 MG tablet TAKE 1/2 TO 1 TABLET BY MOUTH 2 TO 3 TIMES DAILY ONLY IF NEEDED FOR ANXIETY ATTACK AND LIMIT TO 5 DAYS A WEEK TO AVOID ADDICTION AND DEMENTIA  . aspirin EC 81 MG tablet Take 81 mg by mouth daily.  Marland Kitchen atenolol (TENORMIN) 100 MG tablet TAKE 1 TABLET BY MOUTH  DAILY FOR BLOOD PRESSURE  . buPROPion (WELLBUTRIN XL) 300 MG 24 hr tablet Take 1 table tevery Morning for Mood, Focus & Concentration  . Crisaborole (EUCRISA) 2 % OINT Apply 1 application topically 2 (two) times daily.  . Cyanocobalamin (VITAMIN B 12 PO) Take 1 tablet by mouth daily.    Marland Kitchen esomeprazole (NEXIUM) 40 MG capsule Take 1 capsule 2 x /day for GERD & Barrett's Esophagus  . fenofibrate 160 MG tablet Take 1 tablet Daily for Triglycerides  . fish oil-omega-3 fatty acids 1000 MG capsule Take 1 g by mouth daily.   . Flaxseed, Linseed, (FLAX SEEDS PO) Take by mouth.  Marland Kitchen  furosemide (LASIX) 40 MG tablet Take 1 tablet 2 x/day for BP & Fluid  . magnesium oxide (MAG-OX) 400 MG tablet Take 400 mg by mouth 2 (two) times daily.   . methocarbamol (ROBAXIN) 500 MG tablet Take 1 tablet at bedtime for Muscle Spasm.  . montelukast (SINGULAIR) 10 MG tablet Take 1 tablet daily for Allergies & Asthma  . Multiple Vitamin (MULTIVITAMIN WITH MINERALS) TABS tablet Take 1 tablet by mouth daily.  . phentermine (ADIPEX-P) 37.5 MG tablet Take 1/2 to 1 tablet every morning for Dieting & Weight Loss  . potassium chloride (K-DUR) 10 MEQ tablet  Take 1 tablet (10 mEq total) by mouth 3 (three) times daily.  . rosuvastatin (CRESTOR) 40 MG tablet TAKE ONE TABLET BY MOUTH DAILY FOR CHOLESTEROL (Patient taking differently: TAKE ONE TABLET BY MOUTH EVERY OTHER DAY FOR CHOLESTEROL)  . tadalafil (CIALIS) 20 MG tablet Take 1/2 to 1 tablet every 2 to 3 days as needed for XXXX  . terazosin (HYTRIN) 2 MG capsule Take 1 capsule (2 mg total) by mouth at bedtime.  Marland Kitchen testosterone (TESTIM) 50 MG/5GM (1%) GEL Apply 1 Testosterone gel tube Daily  . valsartan (DIOVAN) 320 MG tablet Take 1 tablet every morning for BP  . vitamin C (ASCORBIC ACID) 500 MG tablet Take 500 mg by mouth daily.   . Vitamin D, Ergocalciferol, (DRISDOL) 1.25 MG (50000 UT) CAPS capsule Take 1 capsule Daily for severe Vit D  Deficiency  . Zinc 50 MG TABS Take by mouth daily.   No current facility-administered medications on file prior to visit.     Allergies:  Allergies  Allergen Reactions  . Topiramate     Urinary retention     Medical History:  Past Medical History:  Diagnosis Date  . Allergy   . Anxiety   . Asthma    as a child  . Barrett's esophagus   . GERD (gastroesophageal reflux disease)   . Gout    never had a flare up of gout  . H/O hiatal hernia   . Hyperlipidemia   . Hypertension   . Prediabetes   . Sleep apnea    does not use  cpap  . Vitamin D deficiency    Family history- Reviewed and unchanged Social history- Reviewed and unchanged   Review of Systems:  Review of Systems  Constitutional: Negative for malaise/fatigue and weight loss.  HENT: Negative for hearing loss and tinnitus.   Eyes: Negative for blurred vision and double vision.  Respiratory: Negative for cough, shortness of breath and wheezing.   Cardiovascular: Positive for leg swelling (bil, chronic, recently worse R>L). Negative for chest pain, palpitations, orthopnea and claudication.  Gastrointestinal: Negative for abdominal pain, blood in stool, constipation, diarrhea, heartburn,  melena, nausea and vomiting.  Genitourinary: Negative.   Musculoskeletal: Negative for back pain, falls, joint pain and myalgias.  Skin: Negative for rash.  Neurological: Negative for dizziness, tingling, sensory change, weakness and headaches.  Endo/Heme/Allergies: Negative for polydipsia.  Psychiatric/Behavioral: Negative.   All other systems reviewed and are negative.   Physical Exam: BP 122/78   Pulse 63   Temp (!) 97.3 F (36.3 C)   Ht 6' (1.829 m)   Wt (!) 359 lb (162.8 kg)   SpO2 96%   BMI 48.69 kg/m  Wt Readings from Last 3 Encounters:  03/19/20 (!) 359 lb (162.8 kg)  03/01/20 (!) 355 lb (161 kg)  11/21/19 (!) 353 lb 6.4 oz (160.3 kg)   General Appearance: Morbidly obese AA  male patient, in no apparent distress. Eyes: PERRLA, EOMs, conjunctiva no swelling or erythema Sinuses: No Frontal/maxillary tenderness ENT/Mouth: Ext aud canals clear, TMs without erythema, bulging. Mask in place; oral exam deferred in light of pandemic and no concerns. Hearing normal.  Neck: Supple, thyroid normal.  Respiratory: Respiratory effort normal, BS equal bilaterally without rales, rhonchi, wheezing or stridor.  Cardio: RRR with no MRGs. Severe nonpitting edema of bilateral ankles and lower legs and calves, feet, neg homan's, non-tender, pulses limited due to edema  Abdomen: Soft, morbidly obese abdomen limiting exam; + BS.  Non tender, no guarding, rebound, palpable hernias or masses. Lymphatics: Non tender without lymphadenopathy.  Musculoskeletal: Full ROM, no obvious deformity, 5/5 strength, Normal gait Skin: Warm, dry without rashes, lesions, ecchymosis. rippled texture and some brown discoloration at R lower shin (per patient precedes edema for many years)  Neuro: Cranial nerves intact. No cerebellar symptoms.  Psych: Awake and oriented X 3, normal affect, Insight and Judgment appropriate.   Christopher Ribas, NP 9:07 AM [facility]

## 2020-03-19 ENCOUNTER — Other Ambulatory Visit: Payer: Self-pay

## 2020-03-19 ENCOUNTER — Ambulatory Visit: Payer: 59 | Admitting: Adult Health

## 2020-03-19 ENCOUNTER — Encounter: Payer: Self-pay | Admitting: Adult Health

## 2020-03-19 VITALS — BP 122/78 | HR 63 | Temp 97.3°F | Ht 72.0 in | Wt 359.0 lb

## 2020-03-19 DIAGNOSIS — Z6841 Body Mass Index (BMI) 40.0 and over, adult: Secondary | ICD-10-CM

## 2020-03-19 DIAGNOSIS — R609 Edema, unspecified: Secondary | ICD-10-CM

## 2020-03-19 DIAGNOSIS — I10 Essential (primary) hypertension: Secondary | ICD-10-CM | POA: Diagnosis not present

## 2020-03-19 DIAGNOSIS — I878 Other specified disorders of veins: Secondary | ICD-10-CM | POA: Diagnosis not present

## 2020-03-19 DIAGNOSIS — R0683 Snoring: Secondary | ICD-10-CM

## 2020-03-19 DIAGNOSIS — Z9189 Other specified personal risk factors, not elsewhere classified: Secondary | ICD-10-CM

## 2020-03-19 NOTE — Patient Instructions (Signed)
I think it is possible that you have sleep apnea. It can cause interrupted sleep, headaches, frequent awakenings, fatigue, dry mouth, fast/slow heart beats, memory issues, anxiety/depression, swelling, numbness tingling hands/feet, weight gain, shortness of breath, and the list goes on. Sleep apnea needs to be ruled out because if it is left untreated it does eventually lead to abnormal heart beats, lung failure or heart failure as well as increasing the risk of heart attack and stroke. There are masks you can wear OR a mouth piece that I can give you information about. Often times though people feel MUCH better after getting treatment.   Sleep Apnea  Sleep apnea is a sleep disorder characterized by abnormal pauses in breathing while you sleep. When your breathing pauses, the level of oxygen in your blood decreases. This causes you to move out of deep sleep and into light sleep. As a result, your quality of sleep is poor, and the system that carries your blood throughout your body (cardiovascular system) experiences stress. If sleep apnea remains untreated, the following conditions can develop:  High blood pressure (hypertension).  Coronary artery disease.  Inability to achieve or maintain an erection (impotence).  Impairment of your thought process (cognitive dysfunction). There are three types of sleep apnea: 1. Obstructive sleep apnea--Pauses in breathing during sleep because of a blocked airway. 2. Central sleep apnea--Pauses in breathing during sleep because the area of the brain that controls your breathing does not send the correct signals to the muscles that control breathing. 3. Mixed sleep apnea--A combination of both obstructive and central sleep apnea.  RISK FACTORS The following risk factors can increase your risk of developing sleep apnea:  Being overweight.  Smoking.  Having narrow passages in your nose and throat.  Being of older age.  Being male.  Alcohol use.   Sedative and tranquilizer use.  Ethnicity. Among individuals younger than 35 years, African Americans are at increased risk of sleep apnea. SYMPTOMS   Difficulty staying asleep.  Daytime sleepiness and fatigue.  Loss of energy.  Irritability.  Loud, heavy snoring.  Morning headaches.  Trouble concentrating.  Forgetfulness.  Decreased interest in sex. DIAGNOSIS  In order to diagnose sleep apnea, your caregiver will perform a physical examination. Your caregiver may suggest that you take a home sleep test. Your caregiver may also recommend that you spend the night in a sleep lab. In the sleep lab, several monitors record information about your heart, lungs, and brain while you sleep. Your leg and arm movements and blood oxygen level are also recorded. TREATMENT The following actions may help to resolve mild sleep apnea:  Sleeping on your side.   Using a decongestant if you have nasal congestion.   Avoiding the use of depressants, including alcohol, sedatives, and narcotics.   Losing weight and modifying your diet if you are overweight. There also are devices and treatments to help open your airway:  Oral appliances. These are custom-made mouthpieces that shift your lower jaw forward and slightly open your bite. This opens your airway.  Devices that create positive airway pressure. This positive pressure "splints" your airway open to help you breathe better during sleep. The following devices create positive airway pressure:  Continuous positive airway pressure (CPAP) device. The CPAP device creates a continuous level of air pressure with an air pump. The air is delivered to your airway through a mask while you sleep. This continuous pressure keeps your airway open.  Nasal expiratory positive airway pressure (EPAP) device. The EPAP device   creates positive air pressure as you exhale. The device consists of single-use valves, which are inserted into each nostril and held in  place by adhesive. The valves create very little resistance when you inhale but create much more resistance when you exhale. That increased resistance creates the positive airway pressure. This positive pressure while you exhale keeps your airway open, making it easier to breath when you inhale again.  Bilevel positive airway pressure (BPAP) device. The BPAP device is used mainly in patients with central sleep apnea. This device is similar to the CPAP device because it also uses an air pump to deliver continuous air pressure through a mask. However, with the BPAP machine, the pressure is set at two different levels. The pressure when you exhale is lower than the pressure when you inhale.  Surgery. Typically, surgery is only done if you cannot comply with less invasive treatments or if the less invasive treatments do not improve your condition. Surgery involves removing excess tissue in your airway to create a wider passage way. Document Released: 08/08/2002 Document Revised: 12/13/2012 Document Reviewed: 12/25/2011 [employer] Patient Information 2015 [employer]. This information is not intended to replace advice given to you by your health care provider. Make sure you discuss any questions you have with your health care provider.     

## 2020-04-10 ENCOUNTER — Other Ambulatory Visit: Payer: Self-pay | Admitting: Internal Medicine

## 2020-04-16 ENCOUNTER — Other Ambulatory Visit: Payer: Self-pay | Admitting: Internal Medicine

## 2020-04-16 DIAGNOSIS — K219 Gastro-esophageal reflux disease without esophagitis: Secondary | ICD-10-CM

## 2020-04-23 ENCOUNTER — Encounter: Payer: Self-pay | Admitting: Neurology

## 2020-04-23 ENCOUNTER — Other Ambulatory Visit: Payer: Self-pay

## 2020-04-23 ENCOUNTER — Ambulatory Visit: Payer: 59 | Admitting: Neurology

## 2020-04-23 VITALS — BP 139/85 | HR 61 | Ht 72.0 in | Wt 343.0 lb

## 2020-04-23 DIAGNOSIS — R519 Headache, unspecified: Secondary | ICD-10-CM

## 2020-04-23 DIAGNOSIS — Z6841 Body Mass Index (BMI) 40.0 and over, adult: Secondary | ICD-10-CM

## 2020-04-23 DIAGNOSIS — I700 Atherosclerosis of aorta: Secondary | ICD-10-CM

## 2020-04-23 DIAGNOSIS — I708 Atherosclerosis of other arteries: Secondary | ICD-10-CM

## 2020-04-23 DIAGNOSIS — R0683 Snoring: Secondary | ICD-10-CM | POA: Diagnosis not present

## 2020-04-23 DIAGNOSIS — G478 Other sleep disorders: Secondary | ICD-10-CM

## 2020-04-23 DIAGNOSIS — Z87891 Personal history of nicotine dependence: Secondary | ICD-10-CM

## 2020-04-23 NOTE — Patient Instructions (Signed)
Obesity Hypoventilation Syndrome  Obesity hypoventilation syndrome (OHS) means that you are not breathing well enough to get air in and out of your lungs efficiently (ventilation). This causes a low oxygen level and a high carbon dioxide level in your blood (hypoventilation). Having too much total body fat (obesity) is a significant risk factor for developing OHS. OHS makes it harder for your heart to pump oxygen-rich blood to your body. It can cause sleep disturbances and make you feel sleepy during the day. Over time, OHS can increase your risk for:  Heart disease.  High blood pressure (hypertension).  Reduced ability to absorb sugar from the bloodstream (insulin resistance).  Heart failure. Over time, OHS weakens your heart and can lead to heart failure. What are the causes? The exact cause of OHS is not known. Possible causes include:  Pressure on the lungs from excess body weight.  Obesity-related changes in how much air the lungs can hold (lung capacity) and how much they can expand (lung compliance).  Failure of the brain to regulate oxygen and carbon dioxide levels properly.  Chemicals (hormones) produced by excess fat cells interfering with breathing regulation.  A breathing condition in which breathing pauses or becomes shallow during sleep (sleep apnea). This condition can eventually cause the body to ventilate poorly and to hold onto carbon dioxide during the day. What increases the risk? You may have a greater risk for OHS if you:  Have a BMI of 30 or higher. BMI is an estimate of body fat that is calculated from height and weight. For adults, a BMI of 30 or higher is considered obese.  Are 40?62 years old.  Carry most of your excess weight around your waist.  Experience moderate symptoms of sleep apnea. What are the signs or symptoms? The most common symptoms of OHS are:  Daytime sleepiness.  Lack of energy.  Shortness of breath.  Morning headaches.  Sleep  apnea.  Trouble concentrating.  Irritability, mood swings, or depression.  Swollen veins in the neck.  Swelling of the legs. How is this diagnosed? Your health care provider may suspect OHS if you are obese and have poor breathing during the day and at night. Your health care provider will also do a physical exam. You may have tests to:  Measure your BMI.  Measure your blood oxygen level with a sensor placed on your finger (pulse oximetry).  Measure blood oxygen and carbon dioxide in a blood sample.  Measure the amount of red blood cells in a blood sample. OHS causes the number of red blood cells you have to increase (polycythemia).  Check your breathing ability (pulmonary function testing).  Check your breathing ability, breathing patterns, and oxygen level while you sleep (sleep study). You may also have a chest X-ray to rule out other breathing problems. You may have an electrocardiogram (ECG) and or echocardiogram to check for signs of heart failure. How is this treated? Weight loss is the most important part of treatment for OHS, and it may be the only treatment that you need. Other treatments may include:  Using a device to open your airway while you sleep, such as a continuous positive airway pressure (CPAP) machine that delivers oxygen to your airway through a mask.  Surgery (gastric bypass surgery) to lower your BMI. This may be needed if: ? You are very obese. ? Other treatments have not worked for you. ? Your OHS is very severe and is causing organ damage, such as heart failure. Follow these   instructions at home:  Medicines  Take over-the-counter and prescription medicines only as told by your health care provider.  Ask your health care provider what medicines are safe for you. You may be told to avoid medicines that can impair breathing and make OHS worse, such as sedatives and narcotics. Sleeping habits  If you are prescribed a CPAP machine, make sure you  understand and use the machine as directed.  Try to get 8 hours of sleep every night.  Go to bed at the same time every night, and get up at the same time every day. General instructions  Work with your health care provider to make a diet and exercise plan that helps you reach and maintain a healthy weight.  Eat a healthy diet.  Avoid smoking.  Exercise regularly as told by your health care provider.  During the evening, do not drink caffeine and do not eat heavy meals.  Keep all follow-up visits as told by your health care provider. This is important. Contact a health care provider if:  You experience new or worsening shortness of breath.  You have chest pain.  You have an irregular heartbeat (palpitations).  You have dizziness.  You faint.  You develop a cough.  You have a fever.  You have chest pain when you breathe (pleurisy). This information is not intended to replace advice given to you by your health care provider. Make sure you discuss any questions you have with your health care provider. Document Revised: 12/10/2018 Document Reviewed: 01/28/2016 Elsevier Patient Education  2020 [place] Sleep Information, Adult Quality sleep is important for your mental and physical health. It also improves your quality of life. Quality sleep means you:  Are asleep for most of the time you are in bed.  Fall asleep within 30 minutes.  Wake up no more than once a night.  Are awake for no longer than 20 minutes if you do wake up during the night. Most adults need 7-8 hours of quality sleep each night. How can poor sleep affect me? If you do not get enough quality sleep, you may have:  Mood swings.  Daytime sleepiness.  Confusion.  Decreased reaction time.  Sleep disorders, such as insomnia and sleep apnea.  Difficulty with: ? Solving problems. ? Coping with stress. ? Paying attention. These issues may affect your performance and productivity at  work, school, and at home. Lack of sleep may also put you at higher risk for accidents, suicide, and risky behaviors. If you do not get quality sleep you may also be at higher risk for several health problems, including:  Infections.  Type 2 diabetes.  Heart disease.  High blood pressure.  Obesity.  Worsening of long-term conditions, like arthritis, kidney disease, depression, Parkinson's disease, and epilepsy. What actions can I take to get more quality sleep?      Stick to a sleep schedule. Go to sleep and wake up at about the same time each day. Do not try to sleep less on weekdays and make up for lost sleep on weekends. This does not work.  Try to get about 30 minutes of exercise on most days. Do not exercise 2-3 hours before going to bed.  Limit naps during the day to 30 minutes or less.  Do not use any products that contain nicotine or tobacco, such as cigarettes or e-cigarettes. If you need help quitting, ask your health care provider.  Do not drink caffeinated beverages for at least 8 hours before  going to bed. Coffee, tea, and some sodas contain caffeine.  Do not drink alcohol close to bedtime.  Do not eat large meals close to bedtime.  Do not take naps in the late afternoon.  Try to get at least 30 minutes of sunlight every day. Morning sunlight is best.  Make time to relax before bed. Reading, listening to music, or taking a hot bath promotes quality sleep.  Make your bedroom a place that promotes quality sleep. Keep your bedroom dark, quiet, and at a comfortable room temperature. Make sure your bed is comfortable. Take out sleep distractions like TV, a computer, smartphone, and bright lights.  If you are lying awake in bed for longer than 20 minutes, get up and do a relaxing activity until you feel sleepy.  Work with your health care provider to treat medical conditions that may affect sleeping, such as: ? Nasal obstruction. ? Snoring. ? Sleep apnea and other  sleep disorders.  Talk to your health care provider if you think any of your prescription medicines may cause you to have difficulty falling or staying asleep.  If you have sleep problems, talk with a sleep consultant. If you think you have a sleep disorder, talk with your health care provider about getting evaluated by a specialist. Where to find more information  [place] website: https://sleepfoundation.org  National Heart, Lung, and [place] ([place]): http://www.saunders.info/.pdf  Centers for Disease Control and Prevention (CDC): LearningDermatology.pl Contact a health care provider if you:  Have trouble getting to sleep or staying asleep.  Often wake up very early in the morning and cannot get back to sleep.  Have daytime sleepiness.  Have daytime sleep attacks of suddenly falling asleep and sudden muscle weakness (narcolepsy).  Have a tingling sensation in your legs with a strong urge to move your legs (restless legs syndrome).  Stop breathing briefly during sleep (sleep apnea).  Think you have a sleep disorder or are taking a medicine that is affecting your quality of sleep. Summary  Most adults need 7-8 hours of quality sleep each night.  Getting enough quality sleep is an important part of health and well-being.  Make your bedroom a place that promotes quality sleep and avoid things that may cause you to have poor sleep, such as alcohol, caffeine, smoking, and large meals.  Talk to your health care provider if you have trouble falling asleep or staying asleep. This information is not intended to replace advice given to you by your health care provider. Make sure you discuss any questions you have with your health care provider. Document Revised: 11/25/2017 Document Reviewed: 11/25/2017 Elsevier Patient Education  [place].

## 2020-04-23 NOTE — Progress Notes (Signed)
SLEEP MEDICINE CLINIC    Provider:  Larey Seat, MD  Primary Care Physician:  Unk Pinto, MD [address] [place] [address]     Referring Provider: Unk Pinto, [place],  [place] [address]          Chief Complaint according to patient   Patient presents with:    . New Patient (Initial Visit)           HISTORY OF PRESENT ILLNESS:  Christopher [name] is a 64 year- old African American male patient and  here upon a consultation request and referral on 04/23/2020.  Chief concern according to patient : " I have been heavy and my weight fluctuates, I list 45 pounds and gained I all back- may be sleep apnea is part of my problem"  I have not been told that I have apnea nor that I snore. " "I had a broken septum corrected by ENT- that treated my snoring "   I have the pleasure of seeing Christopher [name] on 04-23-2020 a right-handed Black or [place] American male who  has a past medical history of Allergy, Anxiety, Asthma, Barrett's esophagus, GERD (gastroesophageal reflux disease), Gout, H/O hiatal hernia, Hyperlipidemia, Hypertension, Prediabetes, Sleep apnea, and Vitamin D deficiency.  He never had a sleep study.      Sleep relevant medical history: Nocturia:once or twice at night,  no Tonsillectomy but deviated septum repair- yes.   Family medical /sleep history:  no relatives with OSA, insomnia, and no sleep walkers.    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 He lives in a household with his spouse.  The patient currently works in daytime. Tobacco use; many years ago.  ETOH use ; socially,  Caffeine intake in form of Coffee( 3 or more each day ) Soda( /) Tea ( /) or energy drinks. Irregular exercise in form of walking.       Sleep habits are as follows: The patient's dinner time is between 4,30 PM.  The patient goes to bed at 9-10 PM and continues to sleep for 2-3  hours,  wakes for various reasons- he is not always sure- , the first time at 1 AM.   The preferred sleep position is on his sides, with the support of 2 pillows.  Dreams are reportedly frequent/vivid. This may wake him, he averages 4-5 hours of sleep. He watches Tv in the bed.  5  AM is the usual rise time. The patient wakes up with an alarm. He never needed 7 hours or more.  He reports not feeling refreshed or restored in AM, with symptoms such as rare morning headaches, and residual fatigue.  Naps are taken on sundays in front of the TV - infrequently, lasting from 3-4 hours , not as refreshing as nocturnal sleep.    Review of Systems: Out of a complete 14 system review, the patient complains of only the following symptoms, and all other reviewed systems are negative.:  Fatigue, sleepiness , snoring, fragmented sleep, Insomnia , short sleep, vivid  Dreams- some are recurrent - may be PTSD - he is retired form the [employer] after 29 years.    How likely are you to doze in the following situations: 0 = not likely, 1 = slight chance, 2 = moderate chance, 3 = high chance   Sitting and Reading? Watching Television? Sitting inactive in a public place (theater  or meeting)? As a passenger in a car for an hour without a break? Lying down in the afternoon when circumstances permit? Sitting and talking to someone? Sitting quietly after lunch without alcohol? In a car, while stopped for a few minutes in traffic?   Total = 5/ 24 points   FSS endorsed at 13/ 63 points.   Social History   Socioeconomic History  . Marital status: Married    Spouse name: Not on file  . Number of children: 1  . Years of education: Not on file  . Highest education level: Not on file  Occupational History  . Occupation: Event organiser  Tobacco Use  . Smoking status: Former Smoker    Years: 6.00    Types: Cigarettes    Quit date: 02/07/2013    Years since quitting: 7.2  . Smokeless tobacco: Never Used    Vaping Use  . Vaping Use: Never used  Substance and Sexual Activity  . Alcohol use: No    Alcohol/week: 0.0 standard drinks  . Drug use: No  . Sexual activity: Yes    Partners: Male  Other Topics Concern  . Not on file  Social History Narrative  . Not on file   Social Determinants of Health   Financial Resource Strain:   . Difficulty of Paying Living Expenses: Not on file  Food Insecurity:   . Worried About Charity fundraiser in the Last Year: Not on file  . Ran Out of Food in the Last Year: Not on file  Transportation Needs:   . Lack of Transportation (Medical): Not on file  . Lack of Transportation (Non-Medical): Not on file  Physical Activity:   . Days of Exercise per Week: Not on file  . Minutes of Exercise per Session: Not on file  Stress:   . Feeling of Stress : Not on file  Social Connections:   . Frequency of Communication with Friends and Family: Not on file  . Frequency of Social Gatherings with Friends and Family: Not on file  . Attends Religious Services: Not on file  . Active Member of Clubs or Organizations: Not on file  . Attends Archivist Meetings: Not on file  . Marital Status: Not on file    Family History  Problem Relation Age of Onset  . Arthritis Mother   . Lung cancer Mother   . Heart disease Mother   . Colon cancer Maternal Uncle   . Stroke Brother   . Prostate cancer Father   . Esophageal cancer Neg Hx   . Rectal cancer Neg Hx   . Stomach cancer Neg Hx   . Liver cancer Neg Hx   . Pancreatic cancer Neg Hx   . Colon polyps Neg Hx   . Crohn's disease Neg Hx     Past Medical History:  Diagnosis Date  . Allergy   . Anxiety   . Asthma    as a child  . Barrett's esophagus   . GERD (gastroesophageal reflux disease)   . Gout    never had a flare up of gout  . H/O hiatal hernia   . Hyperlipidemia   . Hypertension   . Prediabetes   . Sleep apnea    does not use  cpap  . Vitamin D deficiency     Past Surgical History:   Procedure Laterality Date  . CLOSED REDUCTION NASAL FRACTURE N/A 06/10/2016   Procedure: CLOSED REDUCTION NASAL FRACTURE FOREHEAD SUTURE REMOVAL;  Surgeon: Lyndee Leo  S Dillingham, DO;  Location: [facility];  Service: Plastics;  Laterality: N/A;  . COLONOSCOPY    . [place]  . Left knee Arthroscopy    . Right knee arthroscopy    . UPPER GASTROINTESTINAL ENDOSCOPY     last egd 12-7*2012     Current Outpatient Medications on File Prior to Visit  Medication Sig Dispense Refill  . 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 90 tablet 1  . allopurinol (ZYLOPRIM) 300 MG tablet Take 1 tablet Daily to Prevent Gout 90 tablet 3  . ALPRAZolam (XANAX) 1 MG tablet TAKE 1/2 TO 1 TABLET BY MOUTH 2 TO 3 TIMES DAILY ONLY IF NEEDED FOR ANXIETY ATTACK AND LIMIT TO 5 DAYS A WEEK TO AVOID ADDICTION AND DEMENTIA 60 tablet 0  . aspirin EC 81 MG tablet Take 81 mg by mouth daily.    Marland Kitchen atenolol (TENORMIN) 100 MG tablet TAKE 1 TABLET BY MOUTH  DAILY FOR BLOOD PRESSURE 90 tablet 3  . buPROPion (WELLBUTRIN XL) 300 MG 24 hr tablet Take 1 table tevery Morning for Mood, Focus & Concentration 90 tablet 3  . Crisaborole (EUCRISA) 2 % OINT Apply 1 application topically 2 (two) times daily. 100 g 0  . Cyanocobalamin (VITAMIN B 12 PO) Take 1 tablet by mouth daily.      Marland Kitchen esomeprazole (NEXIUM) 40 MG capsule Take 1 capsule   2 x     / Day for GERD & Barrett's Esophagitis 180 capsule 0  . fenofibrate 160 MG tablet Take 1 tablet Daily for Triglycerides 90 tablet 3  . fish oil-omega-3 fatty acids 1000 MG capsule Take 1 g by mouth daily.     . Flaxseed, Linseed, (FLAX SEEDS PO) Take by mouth.    . furosemide (LASIX) 40 MG tablet Take 1 tablet 2 x/day for BP & Fluid 180 tablet 3  . magnesium oxide (MAG-OX) 400 MG tablet Take 400 mg by mouth 2 (two) times daily.     . methocarbamol (ROBAXIN) 500 MG tablet Take 1 tablet at bedtime for Muscle Spasm. 90 tablet 3  . montelukast  (SINGULAIR) 10 MG tablet Take 1 tablet daily for Allergies & Asthma 90 tablet 3  . Multiple Vitamin (MULTIVITAMIN WITH MINERALS) TABS tablet Take 1 tablet by mouth daily.    . phentermine (ADIPEX-P) 37.5 MG tablet Take 1/2 to 1 tablet every morning for Dieting & Weight Loss 90 tablet 1  . potassium chloride (KLOR-CON) 10 MEQ tablet TAKE 1 TABLET BY MOUTH 3  TIMES DAILY 270 tablet 1  . rosuvastatin (CRESTOR) 40 MG tablet TAKE ONE TABLET BY MOUTH DAILY FOR CHOLESTEROL (Patient taking differently: TAKE ONE TABLET BY MOUTH EVERY OTHER DAY FOR CHOLESTEROL) 90 tablet 3  . tadalafil (CIALIS) 20 MG tablet Take 1/2 to 1 tablet every 2 to 3 days as needed for XXXX 30 tablet 99  . terazosin (HYTRIN) 2 MG capsule Take 1 capsule (2 mg total) by mouth at bedtime. 90 capsule 0  . testosterone (TESTIM) 50 MG/5GM (1%) GEL Apply 1 Testosterone gel tube Daily 450 g 2  . valsartan (DIOVAN) 320 MG tablet Take 1 tablet every morning for BP 90 tablet 3  . vitamin C (ASCORBIC ACID) 500 MG tablet Take 500 mg by mouth daily.     . Vitamin D, Ergocalciferol, (DRISDOL) 1.25 MG (50000 UT) CAPS capsule Take 1 capsule Daily for severe Vit D  Deficiency 90 capsule 1  .  Zinc 50 MG TABS Take by mouth daily.     No current facility-administered medications on file prior to visit.    Allergies  Allergen Reactions  . Topiramate     Urinary retention    Physical exam:  Today's Vitals   04/23/20 0905  BP: 139/85  Pulse: 61  Weight: (!) 343 lb (155.6 kg)  Height: 6' (1.829 m)   Body mass index is 46.52 kg/m.   Wt Readings from Last 3 Encounters:  04/23/20 (!) 343 lb (155.6 kg)  03/19/20 (!) 359 lb (162.8 kg)  03/01/20 (!) 355 lb (161 kg)     Ht Readings from Last 3 Encounters:  04/23/20 6' (1.829 m)  03/19/20 6' (1.829 m)  11/21/19 6' (1.829 m)      General: The patient is awake, alert and appears not in acute distress. The patient is well groomed. Head: Normocephalic, atraumatic. Neck is supple. Mallampati  2,  neck circumference:19.5 inches . Nasal airflow  patent.  Retrognathia is  seen. Functional macroglossia.  Dental status: intact.  Cardiovascular:  Regular rate and cardiac rhythm by pulse,  without distended neck veins. Respiratory: Lungs are clear to auscultation.  Skin:  With evidence of ankle edema,  Pitting.  Trunk: The patient's posture is erect.   Neurologic exam : The patient is awake and alert, oriented to place and time.   Memory subjective described as intact.  Attention span & concentration ability appears normal.  Speech is fluent,  without  dysarthria, dysphonia or aphasia.  Mood and affect are appropriate.   Cranial nerves: no loss of smell or taste reported  Pupils are equal and briskly reactive to light. Funduscopic exam deferred. .  Extraocular movements in vertical and horizontal planes were intact and without nystagmus. No Diplopia. Visual fields by finger perimetry are intact. Hearing was intact to soft voice and finger rubbing.   Facial sensation intact to fine touch.  Facial motor strength is symmetric and tongue and uvula move midline.  Neck ROM : rotation, tilt and flexion extension were normal for age and shoulder shrug was symmetrical.    Motor exam:  Symmetric bulk, tone and ROM.   Normal tone without cog wheeling, symmetric grip strength .   Sensory:  Fine touch  and vibration were normal.  Proprioception tested in the upper extremities was normal.   Coordination: Rapid alternating movements in the fingers/hands were of normal speed.  The Finger-to-nose maneuver was intact without evidence of ataxia, dysmetria or tremor.   Gait and station: Patient could rise unassisted from a seated position, walked without assistive device.  Stance is of normal width/ base and the patient turned with 3 steps.  Toe and heel walk were deferred.  Deep tendon reflexes: in the  upper and lower extremities are symmetric and intact.  Babinski response was deferred .        After spending a total time of  45  minutes face to face and additional time for physical and neurologic examination, review of laboratory studies,  personal review of imaging studies, reports and results of other testing and review of referral information / records as far as provided in visit, I have established the following assessments:  1) OSA - snoring is known, sleep is fragmented and some morning HA are reported. The patient has no report of apnea .  2) obesity BMI of 46 kg/ m2. Large neck.  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 4) Barrett's - acid reflux.     My Plan is to proceed with:  1) Will order PSG, as patient has ankle edema and reports SOB. Marland Kitchen  2) HST if not permitted by insurance.    I would like to thank Unk Pinto, MD and Unk Pinto, [place],  [place] [address] for allowing me to meet with and to take care of this pleasant patient.   II plan to follow up either personally or through our NP within 2-3  month.   CC: I will share my notes with PCP> .  Electronically signed by: Larey Seat, MD 04/23/2020 9:11 AM  [facility] Board certified by The [employer] of Sleep Medicine and Diplomate of the [employer] of Sleep Medicine. Board certified In Neurology through the [place], Fellow of the [employer] of Neurology. Medical Director of [facility].

## 2020-05-03 ENCOUNTER — Other Ambulatory Visit: Payer: Self-pay | Admitting: Adult Health

## 2020-05-10 ENCOUNTER — Other Ambulatory Visit: Payer: Self-pay

## 2020-05-10 DIAGNOSIS — R609 Edema, unspecified: Secondary | ICD-10-CM

## 2020-05-11 ENCOUNTER — Other Ambulatory Visit: Payer: Self-pay | Admitting: Internal Medicine

## 2020-05-15 ENCOUNTER — Other Ambulatory Visit: Payer: Self-pay | Admitting: Adult Health Nurse Practitioner

## 2020-05-15 ENCOUNTER — Other Ambulatory Visit: Payer: Self-pay | Admitting: Internal Medicine

## 2020-05-15 ENCOUNTER — Other Ambulatory Visit: Payer: Self-pay | Admitting: Adult Health

## 2020-05-15 DIAGNOSIS — I10 Essential (primary) hypertension: Secondary | ICD-10-CM

## 2020-05-17 ENCOUNTER — Telehealth: Payer: Self-pay

## 2020-05-17 ENCOUNTER — Other Ambulatory Visit: Payer: Self-pay | Admitting: Adult Health Nurse Practitioner

## 2020-05-17 DIAGNOSIS — I10 Essential (primary) hypertension: Secondary | ICD-10-CM

## 2020-05-17 NOTE — Telephone Encounter (Signed)
LVM for pt to call me back to schedule sleep study   We have attempted to call the patient three times to schedule sleep study.  Patient has been unavailable at the phone numbers we have on file and has not returned our calls.  If patient calls back we will schedule them for their sleep study.  

## 2020-05-23 ENCOUNTER — Encounter: Payer: Self-pay | Admitting: Vascular Surgery

## 2020-05-23 ENCOUNTER — Other Ambulatory Visit: Payer: Self-pay

## 2020-05-23 ENCOUNTER — Ambulatory Visit (HOSPITAL_COMMUNITY)
Admission: RE | Admit: 2020-05-23 | Discharge: 2020-05-23 | Disposition: A | Discharge status: Home / Self Care | Payer: 59 | Source: Ambulatory Visit | Attending: Vascular Surgery | Admitting: Vascular Surgery

## 2020-05-23 ENCOUNTER — Ambulatory Visit: Payer: 59 | Admitting: Vascular Surgery

## 2020-05-23 VITALS — BP 133/85 | HR 68 | Temp 97.5°F | Resp 16 | Ht 72.0 in | Wt 340.0 lb

## 2020-05-23 DIAGNOSIS — I87303 Chronic venous hypertension (idiopathic) without complications of bilateral lower extremity: Secondary | ICD-10-CM

## 2020-05-23 DIAGNOSIS — R609 Edema, unspecified: Secondary | ICD-10-CM

## 2020-05-23 DIAGNOSIS — I890 Lymphedema, not elsewhere classified: Secondary | ICD-10-CM

## 2020-05-23 NOTE — Progress Notes (Signed)
Referring Physician: Liane Comber, NP  Patient name: Christopher [name] MRN: [id number] DOB: 08-Jan-1956 Sex: male  REASON FOR CONSULT: Bilateral leg swelling HPI: Christopher [name] is a 64 y.o. male, with a 1 year history of bilateral leg swelling primarily confined to the calf bilaterally.  Patient states he has always had fairly large calf in both legs.  However he states over the last year this has become progressively more swollen.  He states if he takes diuretics he does get some improvement but never completely returned to baseline.  He states he also gets cramping in his legs if he takes diuretics.  He has been trying to lose some weight and is currently on phentermine and has made dietary changes going more to plant-based and also walking on a treadmill daily.  He has always struggled with his weight.  He does wear lightweight compression stockings in both lower extremities and these do help control the swelling somewhat.  He is going to get a replacement pair in the near future.  He does not really have any varicose veins.  He has no prior history of DVT.  He has no family history of lymphedema or vein problems.  He has not really had any significant infections in either leg.  Other medical problems include hyperlipidemia, prediabetes, hypertension, sleep apnea.  These are all currently stable.  Past Medical History:  Diagnosis Date  . Allergy   . Anxiety   . Asthma    as a child  . Barrett's esophagus   . GERD (gastroesophageal reflux disease)   . Gout    never had a flare up of gout  . H/O hiatal hernia   . Hyperlipidemia   . Hypertension   . Prediabetes   . Sleep apnea    does not use  cpap  . Vitamin D deficiency    Past Surgical History:  Procedure Laterality Date  . CLOSED REDUCTION NASAL FRACTURE N/A 06/10/2016   Procedure: CLOSED REDUCTION NASAL FRACTURE FOREHEAD SUTURE REMOVAL;  Surgeon: [name], DO;  Location: [facility];  Service: Plastics;   Laterality: N/A;  . COLONOSCOPY    . [place]  . Left knee Arthroscopy    . Right knee arthroscopy    . UPPER GASTROINTESTINAL ENDOSCOPY     last egd 12-7*2012    Family History  Problem Relation Age of Onset  . Arthritis Mother   . Lung cancer Mother   . Heart disease Mother   . Colon cancer Maternal Uncle   . Stroke Brother   . Prostate cancer Father   . Esophageal cancer Neg Hx   . Rectal cancer Neg Hx   . Stomach cancer Neg Hx   . Liver cancer Neg Hx   . Pancreatic cancer Neg Hx   . Colon polyps Neg Hx   . Crohn's disease Neg Hx     SOCIAL HISTORY: Social History   Socioeconomic History  . Marital status: Married    Spouse name: Not on file  . Number of children: 1  . Years of education: Not on file  . Highest education level: Not on file  Occupational History  . Occupation: Event organiser  Tobacco Use  . Smoking status: Former Smoker    Years: 6.00    Types: Cigarettes    Quit date: 02/07/2013    Years since quitting: 7.2  . Smokeless tobacco: Never Used  Vaping Use  . Vaping Use: Never used  Substance and Sexual Activity  . Alcohol use: No    Alcohol/week: 0.0 standard drinks  . Drug use: No  . Sexual activity: Yes    Partners: Male  Other Topics Concern  . Not on file  Social History Narrative  . Not on file   Social Determinants of Health   Financial Resource Strain:   . Difficulty of Paying Living Expenses: Not on file  Food Insecurity:   . Worried About Charity fundraiser in the Last Year: Not on file  . Ran Out of Food in the Last Year: Not on file  Transportation Needs:   . Lack of Transportation (Medical): Not on file  . Lack of Transportation (Non-Medical): Not on file  Physical Activity:   . Days of Exercise per Week: Not on file  . Minutes of Exercise per Session: Not on file  Stress:   . Feeling of Stress : Not on file  Social Connections:   . Frequency of Communication with Friends and Family: Not on file  .  Frequency of Social Gatherings with Friends and Family: Not on file  . Attends Religious Services: Not on file  . Active Member of Clubs or Organizations: Not on file  . Attends Archivist Meetings: Not on file  . Marital Status: Not on file  Intimate Partner Violence:   . Fear of Current or Ex-Partner: Not on file  . Emotionally Abused: Not on file  . Physically Abused: Not on file  . Sexually Abused: Not on file    Allergies  Allergen Reactions  . Topiramate     Urinary retention    Current Outpatient Medications  Medication Sig Dispense Refill  . 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 90 tablet 1  . allopurinol (ZYLOPRIM) 300 MG tablet Take 1 tablet Daily to Prevent Gout 90 tablet 3  . ALPRAZolam (XANAX) 1 MG tablet TAKE 1/2 TO 1 TABLET BY MOUTH 2 TO 3 TIMES DAILY ONLY IF NEEDED FOR ANXIETY ATTACK AND LIMIT TO 5 DAYS A WEEK TO AVOID ADDICTION AND DEMENTIA 60 tablet 0  . aspirin EC 81 MG tablet Take 81 mg by mouth daily.    Marland Kitchen atenolol (TENORMIN) 100 MG tablet TAKE 1 TABLET BY MOUTH  DAILY FOR BLOOD PRESSURE 90 tablet 3  . buPROPion (WELLBUTRIN XL) 300 MG 24 hr tablet TAKE 1 TABLET BY MOUTH IN  THE MORNING FOR MOOD, FOCUS AND CONCENTRATION 90 tablet 3  . Cyanocobalamin (VITAMIN B 12 PO) Take 1 tablet by mouth daily.      Marland Kitchen esomeprazole (NEXIUM) 40 MG capsule Take 1 capsule   2 x     / Day for GERD & Barrett's Esophagitis 180 capsule 0  . EUCRISA 2 % OINT APPLY 1 APPLICATION TOPICALLY TWO TIMES A DAY 100 g 1  . fenofibrate 160 MG tablet Take 1 tablet Daily for Triglycerides 90 tablet 3  . fish oil-omega-3 fatty acids 1000 MG capsule Take 1 g by mouth daily.     . Flaxseed, Linseed, (FLAX SEEDS PO) Take by mouth.    . furosemide (LASIX) 40 MG tablet TAKE 1 TABLET BY MOUTH  TWICE DAILY FOR BLOOD  PRESSURE AND FLUID 180 tablet 3  . magnesium oxide (MAG-OX) 400 MG tablet Take 400 mg by mouth 2 (two) times daily.     . methocarbamol (ROBAXIN)  500 MG tablet Take 1 tablet at bedtime for Muscle Spasm. 90 tablet 3  .  montelukast (SINGULAIR) 10 MG tablet Take 1 tablet daily for Allergies & Asthma 90 tablet 3  . Multiple Vitamin (MULTIVITAMIN WITH MINERALS) TABS tablet Take 1 tablet by mouth daily.    . phentermine (ADIPEX-P) 37.5 MG tablet Take 1/2 to 1 tablet every morning for Dieting & Weight Loss 90 tablet 1  . potassium chloride (KLOR-CON) 10 MEQ tablet TAKE 1 TABLET BY MOUTH 3  TIMES DAILY 270 tablet 1  . rosuvastatin (CRESTOR) 40 MG tablet TAKE ONE TABLET BY MOUTH DAILY FOR CHOLESTEROL (Patient taking differently: TAKE ONE TABLET BY MOUTH EVERY OTHER DAY FOR CHOLESTEROL) 90 tablet 3  . tadalafil (CIALIS) 20 MG tablet Take   1/2 to 1 tablet     every    2 to 3 days     if Needed for XXXX 30 tablet 0  . terazosin (HYTRIN) 2 MG capsule Take   1 capsule     at Bedtime     for BP & Prostate 90 capsule 0  . testosterone (TESTIM) 50 MG/5GM (1%) GEL Apply 1 Testosterone gel tube Daily 450 g 2  . valsartan (DIOVAN) 320 MG tablet Take 1 tablet every morning for BP 90 tablet 3  . vitamin C (ASCORBIC ACID) 500 MG tablet Take 500 mg by mouth daily.     . Vitamin D, Ergocalciferol, (DRISDOL) 1.25 MG (50000 UNIT) CAPS capsule TAKE 1 CAPSULE BY MOUTH  DAILY FOR SEVERE VIT D  DEFICIENCY 90 capsule 3  . Zinc 50 MG TABS Take by mouth daily.     No current facility-administered medications for this visit.    ROS:   General:  No weight loss, Fever, chills  HEENT: No recent headaches, no nasal bleeding, no visual changes, no sore throat  Neurologic: No dizziness, blackouts, seizures. No recent symptoms of stroke or mini- stroke. No recent episodes of slurred speech, or temporary blindness.  Cardiac: No recent episodes of chest pain/pressure, no shortness of breath at rest.  No shortness of breath with exertion.  Denies history of atrial fibrillation or irregular heartbeat  Vascular: No history of rest pain in feet.  No history of claudication.  No  history of non-healing ulcer, No history of DVT   Pulmonary: No home oxygen, no productive cough, no hemoptysis,  No asthma or wheezing  Musculoskeletal:  [ ]  Arthritis, [ ]  Low back pain,  [ ]  Joint pain  Hematologic:No history of hypercoagulable state.  No history of easy bleeding.  No history of anemia  Gastrointestinal: No hematochezia or melena,  No gastroesophageal reflux, no trouble swallowing  Urinary: [ ]  chronic Kidney disease, [ ]  on HD - [ ]  MWF or [ ]  TTHS, [ ]  Burning with urination, [ ]  Frequent urination, [ ]  Difficulty urinating;   Skin: No rashes  Psychological: No history of anxiety,  No history of depression   Physical Examination  Vitals:   05/23/20 1505  BP: 133/85  Pulse: 68  Resp: 16  Temp: (!) 97.5 F (36.4 C)  TempSrc: Temporal  SpO2: 95%  Weight: (!) 340 lb (154.2 kg)  Height: 6' (1.829 m)    Body mass index is 46.11 kg/m.  General:  Alert and oriented, no acute distress HEENT: Normal Neck: No JVD Cardiac: Regular Rate and Rhythm Abdomen: Obese Skin: No rash, peau d'orange changes pretibial area bilaterally with skin thickening and edema Extremity Pulses:  2+ dorsalis pedis pulses bilaterally Musculoskeletal: No deformity absence of a buffalo hump but does have some squaring of the toes  consistent with lymphedema leg circumference on the left side was 35 cm in the calf 58 in the thigh, 61 cm in the thigh on the right 34 cm in the calf Neurologic: Upper and lower extremity motor 5/5 and symmetric  DATA:  Patient had a venous reflux exam today.  This showed no evidence of DVT.  He did have evidence of some mild reflux at the knee and the greater saphenous vein on the right and in the lesser saphenous vein on the right.  He had essentially no reflux on the left leg.  I reviewed and interpreted the study.  ASSESSMENT: Multifactorial leg swelling.  Patient has normal arterial circulation.  He has minimal findings on his venous duplex exam  suggestive that it is a venous etiology.  However his weight certainly is suggestive that he may have a component of venous hypertension.  He also has some characteristics of lymphedema in both lower extremities.  However some of the characteristics are also consistent with light edema.  I had a lengthy discussion with the patient today regarding weight loss options.  He is currently trying to change his diet and exercise.  I did discuss with him that he may need to consider at some point whether or not a weight loss reduction operation would be in his best interest.  He will think about this some.  As far as his legs are concerned he will continue with his compression stockings.  If his symptoms worsen or he is not really noticing any improvement as he loses weight then we could certainly consider him for a lymphedema pump.  He will call me back in a few months if he wishes to pursue this avenue.   PLAN: See above   Ruta Hinds, MD [facility] Office: [phone]

## 2020-05-26 ENCOUNTER — Other Ambulatory Visit: Payer: Self-pay | Admitting: Adult Health

## 2020-05-26 ENCOUNTER — Other Ambulatory Visit: Payer: Self-pay | Admitting: Internal Medicine

## 2020-05-26 DIAGNOSIS — R450 Nervousness: Secondary | ICD-10-CM

## 2020-05-26 DIAGNOSIS — K219 Gastro-esophageal reflux disease without esophagitis: Secondary | ICD-10-CM

## 2020-06-05 ENCOUNTER — Other Ambulatory Visit: Payer: Self-pay | Admitting: Internal Medicine

## 2020-06-05 ENCOUNTER — Other Ambulatory Visit: Payer: Self-pay | Admitting: Physician Assistant

## 2020-06-05 DIAGNOSIS — Z6841 Body Mass Index (BMI) 40.0 and over, adult: Secondary | ICD-10-CM

## 2020-06-05 DIAGNOSIS — I10 Essential (primary) hypertension: Secondary | ICD-10-CM

## 2020-06-05 DIAGNOSIS — I700 Atherosclerosis of aorta: Secondary | ICD-10-CM

## 2020-06-05 DIAGNOSIS — U071 COVID-19: Secondary | ICD-10-CM

## 2020-06-05 MED ORDER — DEXAMETHASONE 4 MG PO TABS: ORAL_TABLET | ORAL | 0 refills | Status: DC

## 2020-06-05 NOTE — Progress Notes (Signed)
I connected by phone with [place] Constable on 06/05/2020 at 4:29 PM to discuss the potential use of a new treatment for mild to moderate COVID-19 viral infection in non-hospitalized patients.  This patient is a 64 y.o. male that meets the FDA criteria for Emergency Use Authorization of COVID monoclonal antibody casirivimab/imdevimab.  Has a (+) direct SARS-CoV-2 viral test result  Has mild or moderate COVID-19   Is NOT hospitalized due to COVID-19  Is within 10 days of symptom onset  Has at least one of the high risk factor(s) for progression to severe COVID-19 and/or hospitalization as defined in EUA.  Specific high risk criteria : BMI > 25 and Cardiovascular disease or hypertension   I have spoken and communicated the following to the patient or parent/caregiver regarding COVID monoclonal antibody treatment:  1. FDA has authorized the emergency use for the treatment of mild to moderate COVID-19 in adults and pediatric patients with positive results of direct SARS-CoV-2 viral testing who are 78 years of age and older weighing at least 40 kg, and who are at high risk for progressing to severe COVID-19 and/or hospitalization.  2. The significant known and potential risks and benefits of COVID monoclonal antibody, and the extent to which such potential risks and benefits are unknown.  3. Information on available alternative treatments and the risks and benefits of those alternatives, including clinical trials.  4. Patients treated with COVID monoclonal antibody should continue to self-isolate and use infection control measures (e.g., wear mask, isolate, social distance, avoid sharing personal items, clean and disinfect high touch surfaces, and frequent handwashing) according to CDC guidelines.   5. The patient or parent/caregiver has the option to accept or refuse COVID monoclonal antibody treatment.  After reviewing this information with the patient, the patient has agreed to receive one  of the available covid 19 monoclonal antibodies and will be provided an appropriate fact sheet prior to infusion.  Sx onset 10/3. Set up for infusion on 10/6 @ 2:30pm. Directions given to [facility]. Pt is aware that insurance will be charged an infusion fee. Pt is fully vaccinated.   Angelena Form 06/05/2020 4:29 PM

## 2020-06-06 ENCOUNTER — Other Ambulatory Visit (HOSPITAL_COMMUNITY): Payer: Self-pay

## 2020-06-06 ENCOUNTER — Ambulatory Visit (HOSPITAL_COMMUNITY)
Admission: RE | Admit: 2020-06-06 | Discharge: 2020-06-06 | Disposition: A | Discharge status: Home / Self Care | Payer: 59 | Source: Ambulatory Visit | Attending: Pulmonary Disease | Admitting: Pulmonary Disease

## 2020-06-06 DIAGNOSIS — Z6841 Body Mass Index (BMI) 40.0 and over, adult: Secondary | ICD-10-CM | POA: Insufficient documentation

## 2020-06-06 DIAGNOSIS — I10 Essential (primary) hypertension: Secondary | ICD-10-CM

## 2020-06-06 DIAGNOSIS — U071 COVID-19: Secondary | ICD-10-CM | POA: Insufficient documentation

## 2020-06-06 DIAGNOSIS — I700 Atherosclerosis of aorta: Secondary | ICD-10-CM | POA: Diagnosis not present

## 2020-06-06 DIAGNOSIS — I708 Atherosclerosis of other arteries: Secondary | ICD-10-CM | POA: Diagnosis present

## 2020-06-06 MED ORDER — METHYLPREDNISOLONE SODIUM SUCC 125 MG IJ SOLR: 125.0000 mg | Freq: Once | INTRAMUSCULAR | Status: DC | PRN

## 2020-06-06 MED ORDER — EPINEPHRINE 0.3 MG/0.3ML IJ SOAJ: 0.3000 mg | Freq: Once | INTRAMUSCULAR | Status: DC | PRN

## 2020-06-06 MED ORDER — ALBUTEROL SULFATE HFA 108 (90 BASE) MCG/ACT IN AERS: 2.0000 | INHALATION_SPRAY | Freq: Once | RESPIRATORY_TRACT | Status: DC | PRN

## 2020-06-06 MED ORDER — SODIUM CHLORIDE 0.9 % IV SOLN: INTRAVENOUS | Status: DC | PRN

## 2020-06-06 MED ORDER — SODIUM CHLORIDE 0.9 % IV SOLN
1200.0000 mg | Freq: Once | INTRAVENOUS | Status: AC
  Administered 2020-06-06: 1200 mg via INTRAVENOUS
  Filled 2020-06-06: qty 10

## 2020-06-06 MED ORDER — FAMOTIDINE IN NACL 20-0.9 MG/50ML-% IV SOLN: 20.0000 mg | Freq: Once | INTRAVENOUS | Status: DC | PRN

## 2020-06-06 MED ORDER — DIPHENHYDRAMINE HCL 50 MG/ML IJ SOLN: 50.0000 mg | Freq: Once | INTRAMUSCULAR | Status: DC | PRN

## 2020-06-06 NOTE — Discharge Instructions (Signed)

## 2020-06-06 NOTE — Progress Notes (Signed)
  Diagnosis: COVID-19  Physician: Asencion Noble, MD  Procedure: [facility] Infusion Clinic Med: casirivimab\imdevimab infusion - Provided patient with casirivimab\imdevimab fact sheet for patients, parents and caregivers prior to infusion.  Complications: No immediate complications noted.  Discharge: Discharged home   Christopher [name] 06/06/2020

## 2020-06-07 ENCOUNTER — Ambulatory Visit: Payer: 59 | Admitting: Internal Medicine

## 2020-06-13 ENCOUNTER — Other Ambulatory Visit: Payer: Self-pay | Admitting: Internal Medicine

## 2020-06-13 MED ORDER — BENZONATATE 200 MG PO CAPS: ORAL_CAPSULE | ORAL | 1 refills | Status: DC

## 2020-06-13 MED ORDER — DEXAMETHASONE 4 MG PO TABS
ORAL_TABLET | ORAL | 0 refills | Status: DC
Start: 2020-06-13 — End: 2020-08-12

## 2020-06-13 MED ORDER — AZITHROMYCIN 250 MG PO TABS: ORAL_TABLET | ORAL | 1 refills | Status: DC

## 2020-06-14 ENCOUNTER — Other Ambulatory Visit: Payer: Self-pay | Admitting: Internal Medicine

## 2020-07-25 ENCOUNTER — Other Ambulatory Visit: Payer: Self-pay | Admitting: Internal Medicine

## 2020-07-25 MED ORDER — TERAZOSIN HCL 2 MG PO CAPS
ORAL_CAPSULE | ORAL | 0 refills | Status: DC
Start: 2020-07-25 — End: 2020-08-09

## 2020-07-31 ENCOUNTER — Other Ambulatory Visit: Payer: Self-pay | Admitting: Internal Medicine

## 2020-08-09 ENCOUNTER — Other Ambulatory Visit: Payer: Self-pay | Admitting: Internal Medicine

## 2020-08-09 MED ORDER — TERAZOSIN HCL 2 MG PO CAPS
ORAL_CAPSULE | ORAL | 0 refills | Status: DC
Start: 2020-08-09 — End: 2020-09-30

## 2020-08-12 ENCOUNTER — Encounter: Payer: Self-pay | Admitting: Internal Medicine

## 2020-08-12 NOTE — Patient Instructions (Signed)

## 2020-08-12 NOTE — Progress Notes (Signed)
History of Present Illness:       This very nice 64 y.o. [name] presents for 6 month follow up with HTN, HLD, Pre-Diabetes, Testosterone &Vitamin D Deficiency. Patient has Gout controlled on his Allopurinol.  Patient also has GERD &  Barrett's followed by Dr Loletha Carrow.      Patient is treated for HTN  (2005) & BP has been controlled at home. Today's BP is high normal Diastolic at  768/11. Patient has had no complaints of any cardiac type chest pain, palpitations, dyspnea / orthopnea / PND, dizziness, claudication, or dependent edema.      Hyperlipidemia is not controlled with diet & is off of his Rosuvastatin.  Patient reports myalgias which he attributes to Rosuvastatin.  Last Lipids off meds were not at goal:  Lab Results  Component Value Date   CHOL 171 03/01/2020   HDL 35 (L) 03/01/2020   LDLCALC 114 (H) 03/01/2020   TRIG 108 03/01/2020   CHOLHDL 4.9 03/01/2020    Also, the patient has Gluttony /Morbid Obesity (BMI 46+) and consequent  PreDiabetes (A1c 5.9% /2010) and has had no symptoms of reactive hypoglycemia, diabetic polys, paresthesias or visual blurring.  Last A1c was not at goal:  Lab Results  Component Value Date   HGBA1C 6.0 (H) 11/21/2019           Patient is on replacement therapy Testosterone Deficiency  ("228" / 2001) by gel  and reports improved  Stamina & sense of well being.       Further, the patient also has history of Vitamin D Deficiency ("17" /2008)and supplements vitamin D without any suspected side-effects. Last vitamin D was at goal:  Lab Results  Component Value Date   VD25OH 32 11/21/2019    Current Outpatient Medications on File Prior to Visit  Medication Sig  . 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 allopurinol  300 MG  Take 1 tablet Daily to Prevent Gout  . ALPRAZolam  1 MG  TAKE 1/2-1 TAB 2 -3 x /day only if needed   . aspirin EC 81 MG tablet Take daily.  Marland Kitchen atenolol 100 MG tablet TAKE 1 TABLET  DAILY FOR BLOOD PRESSURE  .  buPROPion-XL) 300 MG  TAKE 1 TABLET IN  THE MORNING   . VITAMIN B 12  tab Take 1 tablet  daily.  Marland Kitchen esomeprazole  40 MG  Take    1 capsule     2 x /day       . EUCRISA 2 % OINT APPLY TOPICALLY TWO TIMES A DAY  . fenofibrate 160 MG tablet Take 1 tablet Daily for Triglycerides  . fish oil-omega-3 1000 MG  Take  daily.  Marland Kitchen FLAX SEEDS  Take daily  . furosemide 40 MG tablet TAKE 1 TABLET   TWICE DAILY   . magnesium oxide 400 MG  Take 2  times daily.   . methocarbamol 500 MG  Take 1 tablet at bedtime for Muscle Spasm.  . montelukast  10 MG tablet Take     1 tablet      Daily     for Allergies  . MULTI-VIT w/MINERALS Take 1 tablet  daily.  . potassium chloride  10 MEQ  TAKE 1 TAB 3  TIMES DAILY  . CIALIS 20 MG tablet Take 1/2 to 1 tablet  every 2 to 3 days as needed  . terazosin 2 MG capsule Take   1 capsule  at Bedtime     for BP & Prostate  . TESTIM  (1%) GEL Apply 1 Testosterone gel tube Daily  . valsartan  320 MG tablet Take 1 tablet every morning for BP  . vitamin C  500 MG tablet Take 500 mg  daily.   . Vitamin D  50,000 U  TAKE 1 CAP   DAILY  . Zinc 50 MG TABS Take daily.  . phentermine  37.5 MG t  Patient not taking: Reported on 08/13/2020  . rosuvastatin 40 MG tablet Patient not taking: Reported on 08/13/2020    No Known Allergies  PMHx:   Past Medical History:  Diagnosis Date  . Allergy   . Anxiety   . Asthma    as a child  . Barrett's esophagus   . GERD (gastroesophageal reflux disease)   . Gout    never had a flare up of gout  . H/O hiatal hernia   . Hyperlipidemia   . Hypertension   . Prediabetes   . Sleep apnea    does not use  cpap  . Vitamin D deficiency     Immunization History  Administered Date(s) Administered  . Influenza Inj Mdck Quad With Preservative 07/02/2017, 06/15/2018  . Influenza Split 06/28/2014, 06/28/2015  . Influenza,inj,Quad PF,6+ Mos 06/19/2019  . Influenza,inj,quad, With Preservative 08/11/2016  . Influenza-Unspecified 09/01/2012  .  PFIZER SARS-COV-2 Vaccination 10/17/2019, 11/07/2019  . PPD Test 06/28/2014, 01/18/2016, 08/11/2016, 10/12/2017, 11/01/2018, 11/21/2019  . Pneumococcal-Unspecified 09/01/1998  . Tdap 08/01/2013  . Zoster Recombinat (Shingrix) 06/19/2019, 08/23/2019    Past Surgical History:  Procedure Laterality Date  . CLOSED REDUCTION NASAL FRACTURE N/A 06/10/2016   Procedure: CLOSED REDUCTION NASAL FRACTURE FOREHEAD SUTURE REMOVAL;  Surgeon: [name], DO;  Location: [facility];  Service: Plastics;  Laterality: N/A;  . COLONOSCOPY    . [place]  . Left knee Arthroscopy    . Right knee arthroscopy    . UPPER GASTROINTESTINAL ENDOSCOPY     last egd 12-7*2012    FHx:    Reviewed / unchanged  SHx:    Reviewed / unchanged   Systems Review:  Constitutional: Denies fever, chills, wt changes, headaches, insomnia, fatigue, night sweats, change in appetite. Eyes: Denies redness, blurred vision, diplopia, discharge, itchy, watery eyes.  ENT: Denies discharge, congestion, post nasal drip, epistaxis, sore throat, earache, hearing loss, dental pain, tinnitus, vertigo, sinus pain, snoring.  CV: Denies chest pain, palpitations, irregular heartbeat, syncope, dyspnea, diaphoresis, orthopnea, PND, claudication or edema. Respiratory: denies cough, dyspnea, DOE, pleurisy, hoarseness, laryngitis, wheezing.  Gastrointestinal: Denies dysphagia, odynophagia, heartburn, reflux, water brash, abdominal pain or cramps, nausea, vomiting, bloating, diarrhea, constipation, hematemesis, melena, hematochezia  or hemorrhoids. Genitourinary: Denies dysuria, frequency, urgency, nocturia, hesitancy, discharge, hematuria or flank pain. Musculoskeletal: Denies arthralgias, myalgias, stiffness, jt. swelling, pain, limping or strain/sprain.  Skin: Denies pruritus, rash, hives, warts, acne, eczema or change in skin lesion(s). Neuro: No weakness, tremor, incoordination, spasms, paresthesia or  pain. Psychiatric: Denies confusion, memory loss or sensory loss. Endo: Denies change in weight, skin or hair change.  Heme/Lymph: No excessive bleeding, bruising or enlarged lymph nodes.  Physical Exam  BP 126/90   Pulse 63   Temp (!) 97 F (36.1 C)   Resp 18   Ht 6' (1.829 m)   Wt (!) 336 lb (152.4 kg)   SpO2 97%   BMI 45.57 kg/m   Appears  well nourished, well groomed  and in no distress.  Eyes:  PERRLA, EOMs, conjunctiva no swelling or erythema. Sinuses: No frontal/maxillary tenderness ENT/Mouth: EAC's clear, TM's nl w/o erythema, bulging. Nares clear w/o erythema, swelling, exudates. Oropharynx clear without erythema or exudates. Oral hygiene is good. Tongue normal, non obstructing. Hearing intact.  Neck: Supple. Thyroid not palpable. Car 2+/2+ without bruits, nodes or JVD. Chest: Respirations nl with BS clear & equal w/o rales, rhonchi, wheezing or stridor.  Cor: Heart sounds normal w/ regular rate and rhythm without sig. murmurs, gallops, clicks or rubs. Peripheral pulses normal and equal  without edema.  Abdomen: Soft,Rotund  & bowel sounds normal. Non-tender w/o guarding, rebound, hernias, masses or organomegaly.  Lymphatics: Unremarkable.  Musculoskeletal: Full ROM all peripheral extremities, joint stability, 5/5 strength and normal gait.  Skin: Warm, dry without exposed rashes, lesions or ecchymosis apparent.  Neuro: Cranial nerves intact, reflexes equal bilaterally. Sensory-motor testing grossly intact. Tendon reflexes grossly intact.  Pysch: Alert & oriented x 3.  Insight and judgement nl & appropriate. No ideations.  Assessment and Plan:  1. Essential hypertension  - Continue medication, monitor blood pressure at home.  - Continue DASH diet.  Reminder to go to the ER if any CP,  SOB, nausea, dizziness, severe HA, changes vision/speech.  - CBC with Differential/Platelet - COMPLETE METABOLIC PANEL WITH GFR - Magnesium - TSH  2. Hyperlipidemia, mixed   -  Continue diet/meds, exercise,& lifestyle modifications.  - Continue monitor periodic cholesterol/liver & renal functions    - Lipid panel - TSH  3. Abnormal glucose  - Continue diet, exercise  - Lifestyle modifications.  - Monitor appropriate labs.  - Hemoglobin A1c - Insulin, random  4. Vitamin D deficiency  - Continue supplementation.  - VITAMIN D 25 Hydroxy   5. Idiopathic gout  - Uric acid  6. Testosterone deficiency  - Testosterone  7. Gastroesophageal reflux disease,hx/o Barrett's  - CBC with Differential/Platelet  8. Class 3 severe obesity due to excess calories with  body mass index (BMI) of 45.0 to 49.9 in adult (HCC)  - TSH  9. Medication management  - CBC with Differential/Platelet - COMPLETE METABOLIC PANEL WITH GFR - Magnesium - Lipid panel - TSH - Hemoglobin A1c - Insulin, random - VITAMIN D 25 Hydroxy   - Uric acid - Testosterone  10. Prediabetes  - Continue diet, exercise  - Lifestyle modifications.  - Monitor appropriate labs.  - Hemoglobin A1c - Insulin, random        Discussed  regular exercise, BP monitoring, weight control to achieve/maintain BMI less than 25 and discussed med and SE's. Recommended labs to assess and monitor clinical status with further disposition pending results of labs.  I discussed the assessment and treatment plan with the patient. The patient was provided an opportunity to ask questions and all were answered. The patient agreed with the plan and demonstrated an understanding of the instructions.  I provided over 30 minutes of exam, counseling, chart review and  complex critical decision making.   Kirtland Bouchard, MD

## 2020-08-13 ENCOUNTER — Ambulatory Visit: Payer: 59 | Admitting: Internal Medicine

## 2020-08-13 ENCOUNTER — Other Ambulatory Visit: Payer: Self-pay

## 2020-08-13 VITALS — BP 126/90 | HR 63 | Temp 97.0°F | Resp 18 | Ht 72.0 in | Wt 336.0 lb

## 2020-08-13 DIAGNOSIS — R7309 Other abnormal glucose: Secondary | ICD-10-CM | POA: Diagnosis not present

## 2020-08-13 DIAGNOSIS — E782 Mixed hyperlipidemia: Secondary | ICD-10-CM

## 2020-08-13 DIAGNOSIS — M1000 Idiopathic gout, unspecified site: Secondary | ICD-10-CM

## 2020-08-13 DIAGNOSIS — K219 Gastro-esophageal reflux disease without esophagitis: Secondary | ICD-10-CM

## 2020-08-13 DIAGNOSIS — E559 Vitamin D deficiency, unspecified: Secondary | ICD-10-CM | POA: Diagnosis not present

## 2020-08-13 DIAGNOSIS — Z79899 Other long term (current) drug therapy: Secondary | ICD-10-CM

## 2020-08-13 DIAGNOSIS — Z6841 Body Mass Index (BMI) 40.0 and over, adult: Secondary | ICD-10-CM

## 2020-08-13 DIAGNOSIS — I10 Essential (primary) hypertension: Secondary | ICD-10-CM | POA: Diagnosis not present

## 2020-08-13 DIAGNOSIS — E349 Endocrine disorder, unspecified: Secondary | ICD-10-CM

## 2020-08-13 DIAGNOSIS — R7303 Prediabetes: Secondary | ICD-10-CM

## 2020-08-14 LAB — COMPLETE METABOLIC PANEL WITH GFR
AG Ratio: 1.6 (calc) (ref 1.0–2.5)
ALT: 17 U/L (ref 9–46)
AST: 16 U/L (ref 10–35)
Albumin: 4.2 g/dL (ref 3.6–5.1)
Alkaline phosphatase (APISO): 55 U/L (ref 35–144)
BUN: 15 mg/dL (ref 7–25)
CO2: 27 mmol/L (ref 20–32)
Calcium: 9.3 mg/dL (ref 8.6–10.3)
Chloride: 103 mmol/L (ref 98–110)
Creat: 1.15 mg/dL (ref 0.70–1.25)
GFR, Est African American: 79 mL/min/{1.73_m2} (ref >=60)
GFR, Est Non African American: 68 mL/min/{1.73_m2} (ref >=60)
Globulin: 2.6 g/dL (calc) (ref 1.9–3.7)
Glucose, Bld: 104 mg/dL — ABNORMAL HIGH (ref 65–99)
Potassium: 4 mmol/L (ref 3.5–5.3)
Sodium: 141 mmol/L (ref 135–146)
Total Bilirubin: 0.5 mg/dL (ref 0.2–1.2)
Total Protein: 6.8 g/dL (ref 6.1–8.1)

## 2020-08-14 LAB — CBC WITH DIFFERENTIAL/PLATELET
Absolute Monocytes: 715 cells/uL (ref 200–950)
Basophils Absolute: 22 cells/uL (ref 0–200)
Basophils Relative: 0.4 %
Eosinophils Absolute: 160 cells/uL (ref 15–500)
Eosinophils Relative: 2.9 %
HCT: 39 % (ref 38.5–50.0)
Hemoglobin: 13.3 g/dL (ref 13.2–17.1)
Lymphs Abs: 1925 cells/uL (ref 850–3900)
MCH: 31.2 pg (ref 27.0–33.0)
MCHC: 34.1 g/dL (ref 32.0–36.0)
MCV: 91.5 fL (ref 80.0–100.0)
MPV: 10.8 fL (ref 7.5–12.5)
Monocytes Relative: 13 %
Neutro Abs: 2679 cells/uL (ref 1500–7800)
Neutrophils Relative %: 48.7 %
Platelets: 119 10*3/uL — ABNORMAL LOW (ref 140–400)
RBC: 4.26 10*6/uL (ref 4.20–5.80)
RDW: 14 % (ref 11.0–15.0)
Total Lymphocyte: 35 %
WBC: 5.5 10*3/uL (ref 3.8–10.8)

## 2020-08-14 LAB — LIPID PANEL
Cholesterol: 170 mg/dL (ref <200)
HDL: 40 mg/dL (ref >=40)
LDL Cholesterol (Calc): 108 mg/dL (calc) — ABNORMAL HIGH
Non-HDL Cholesterol (Calc): 130 mg/dL (calc) — ABNORMAL HIGH (ref <130)
Total CHOL/HDL Ratio: 4.3 (calc) (ref <5.0)
Triglycerides: 114 mg/dL (ref <150)

## 2020-08-14 LAB — URIC ACID: Uric Acid, Serum: 7.3 mg/dL (ref 4.0–8.0)

## 2020-08-14 LAB — TSH: TSH: 1.8 mIU/L (ref 0.40–4.50)

## 2020-08-14 LAB — HEMOGLOBIN A1C
Hgb A1c MFr Bld: 6 % of total Hgb — ABNORMAL HIGH (ref <5.7)
Mean Plasma Glucose: 126 mg/dL
eAG (mmol/L): 7 mmol/L

## 2020-08-14 LAB — INSULIN, RANDOM: Insulin: 17.8 u[IU]/mL

## 2020-08-14 LAB — MAGNESIUM: Magnesium: 2 mg/dL (ref 1.5–2.5)

## 2020-08-14 LAB — TESTOSTERONE: Testosterone: 415 ng/dL (ref 250–827)

## 2020-08-14 LAB — VITAMIN D 25 HYDROXY (VIT D DEFICIENCY, FRACTURES): Vit D, 25-Hydroxy: 86 ng/mL (ref 30–100)

## 2020-08-14 NOTE — Progress Notes (Signed)
========================================================== -   Test results slightly outside the reference range are not unusual. If there is anything important, I will review this with you,  otherwise it is considered normal test values.  If you have further questions,  please do not hesitate to contact me at the office or via My Chart.  ==========================================================  -  Total Chol = 170 -  Excellent   - Very low risk for Heart Attack  / Stroke ========================================================  - But  the bad / Dangerous LDL Chol = 108 - too high ! (Ideal or Goal is less than 70 !  )  ==========================================================  -  So  - Recommend a stricter low cholesterol diet   - Cholesterol only comes from animal sources  - ie. meat, dairy, egg yolks  - Eat all the vegetables you want.  - Avoid meat, especially red meat - Beef AND Pork .  - Avoid cheese & dairy - milk & ice cream.     - Cheese is the most concentrated form of trans-fats which  is the worst thing to clog up our arteries.   - Veggie cheese is OK which can be found in the fresh  produce section at [employer] or [employer] or [employer] ==========================================================  -  A1c = 6.0%, still elevated in the borderline and  early or pre-diabetes range which has the same   300% increased risk for heart attack, stroke, cancer and   alzheimer- type vascular dementia as full blown diabetes.   But the good news is that diet, exercise with  weight loss can cure the early diabetes at this point. ==========================================================  -   It is very important that you work harder with diet by  avoiding all foods that are white except chicken,   fish & calliflower.  - Avoid white rice  (brown & wild rice is OK),   - Avoid white potatoes  (sweet potatoes in moderation is OK),   White bread or wheat bread or  anything made out of   white flour like bagels, donuts, rolls, buns, biscuits, cakes,  - pastries, cookies, pizza crust, and pasta (made from  white flour & egg whites)   - vegetarian pasta or spinach or wheat pasta is OK.  - Multigrain breads like Arnold's, Pepperidge Farm or   multigrain sandwich thins or high fiber breads like   Eureka bread or "Dave's Killer" breads that are  4 to 5 grams fiber per slice !  are best.    Diet, exercise and weight loss can reverse and cure  diabetes in the early stages.   ==========================================================  -  Vitamin D= 86 - Excellent  ! ==========================================================  -  All Else - CBC - Kidneys - Electrolytes - Liver - Magnesium & Thyroid    - all  Normal / OK ==========================================================

## 2020-09-02 ENCOUNTER — Other Ambulatory Visit: Payer: Self-pay | Admitting: Internal Medicine

## 2020-09-02 DIAGNOSIS — R450 Nervousness: Secondary | ICD-10-CM

## 2020-09-04 ENCOUNTER — Other Ambulatory Visit: Payer: Self-pay | Admitting: Internal Medicine

## 2020-09-04 MED ORDER — TADALAFIL 20 MG PO TABS
ORAL_TABLET | ORAL | 0 refills | Status: DC
Start: 2020-09-04 — End: 2021-02-04

## 2020-09-24 ENCOUNTER — Encounter: Payer: Self-pay | Admitting: Neurology

## 2020-09-30 ENCOUNTER — Other Ambulatory Visit: Payer: Self-pay | Admitting: Internal Medicine

## 2020-10-09 ENCOUNTER — Other Ambulatory Visit: Payer: Self-pay | Admitting: Internal Medicine

## 2020-10-10 ENCOUNTER — Ambulatory Visit (INDEPENDENT_AMBULATORY_CARE_PROVIDER_SITE_OTHER): Payer: 59 | Admitting: Neurology

## 2020-10-10 DIAGNOSIS — G4733 Obstructive sleep apnea (adult) (pediatric): Secondary | ICD-10-CM

## 2020-10-10 DIAGNOSIS — G478 Other sleep disorders: Secondary | ICD-10-CM

## 2020-10-10 DIAGNOSIS — R519 Headache, unspecified: Secondary | ICD-10-CM

## 2020-10-10 DIAGNOSIS — R0683 Snoring: Secondary | ICD-10-CM

## 2020-10-11 NOTE — Progress Notes (Signed)
   [facility] ([facility])  STUDY DATE: 10/11/20  DOB: [birth year]  MRN: [id number]  ORDERING CLINICIAN: Larey Seat, MD   REFERRING CLINICIAN: Unk Pinto, MD   CLINICAL INFORMATION/HISTORY: Christopher Perrell Simpsonis a 65 year- old African American male patientand seen in a consultation requested by his PCPon 04/23/2020. Chiefconcernaccording to patient : " I have been heavy and my weight fluctuates, I lost 45 pounds and gained it all back- may be sleep apnea is part of my problem" also, : " I have not been told that I have apnea now nor that I snore. 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 He never had a sleep study. He is excessively daytime sleepy.   Epworth sleepiness score: 15/24. BMI: 46.6 kg/m Neck Circumference: 19. "  FINDINGS:  Total Record Time (hours, min): 3 h 40 min Total Sleep Time (hours, min):  2 h 42 min   Percent REM (%):    21.6 %   Calculated pAHI (per hour): 70.7      REM pAHI: 65.1   NREM pAHI: 72.3 Supine AHI: 91.5   Oxygen Saturation (%) Mean: 90  Minimum oxygen saturation (%):         67   O2 Saturation Range (%): 67-98  O2Saturation (minutes) <=88%: 47.1 min  Pulse Mean (bpm):    69  Pulse Range (50-88)   IMPRESSION: This HST recording time was limited to 3 h and 40 minutes, impacting the validity of collected data.  Severe OSA (obstructive sleep apnea) was noted even in this very limited time, at an AHI of 70/h.  Supine sleep further exacerbated the degree of apnea. There was a significant proportion of hypoxemic sleep noted, at 47 minutes out of a sleep time of 162 minutes.    RECOMMENDATION: The patient will be invited to an attended sleep study for PAP titration which will give [place] opportunity to also titrate him to oxygen as needed, and to try the best fitting interface for him.  PS: This is an urgent return for PAP titration- please mark on REPORT.   INTERPRETING PHYSICIAN:  Larey Seat, MD   [facility] and [facility] Sleep Board certified by The [employer] of Sleep Medicine and Fellow of the [employer] of Neurology. Medical Director of [employer].

## 2020-10-15 ENCOUNTER — Encounter: Payer: Self-pay | Admitting: Gastroenterology

## 2020-10-22 DIAGNOSIS — G478 Other sleep disorders: Secondary | ICD-10-CM | POA: Insufficient documentation

## 2020-10-22 DIAGNOSIS — G4733 Obstructive sleep apnea (adult) (pediatric): Secondary | ICD-10-CM | POA: Insufficient documentation

## 2020-10-22 DIAGNOSIS — R0683 Snoring: Secondary | ICD-10-CM | POA: Insufficient documentation

## 2020-10-22 DIAGNOSIS — R519 Headache, unspecified: Secondary | ICD-10-CM | POA: Insufficient documentation

## 2020-10-22 DIAGNOSIS — G4734 Idiopathic sleep related nonobstructive alveolar hypoventilation: Secondary | ICD-10-CM | POA: Insufficient documentation

## 2020-10-22 NOTE — Procedures (Signed)
[facility] ([facility])  STUDY DATE: 10/11/20  DOB: [birth year]  MRN: [id number]  ORDERING CLINICIAN: Larey Seat, MD   REFERRING CLINICIAN: Unk Pinto, MD   CLINICAL INFORMATION/HISTORY: Christopher Deboard Simpsonis a 65 year- old African American male patientand seen in a consultation requested by his PCPon 04/23/2020. Chiefconcernaccording to patient : " I have been heavy and my weight fluctuates, I lost 45 pounds and gained it all back- may be sleep apnea is part of my problem" also, : " I have not been told that I have apnea now nor that I snore. " "I had a broken septum corrected by ENT- that treated my snoring "  Christopher [name] has a medical history of Allergy, Anxiety, Asthma, Barrett's esophagus, GERD (gastroesophageal reflux disease), Overweight, Gout, H/O hiatal hernia, Hyperlipidemia, Hypertension, Pre-diabetes, previously diagnosed septal deviation and possible Sleep apnea, and Vitamin D deficiency. He never had a sleep study. He is excessively daytime sleepy.   Epworth sleepiness score: 15/24. BMI: 46.6 kg/m Neck Circumference: 19. "  FINDINGS:  Total Record Time (hours, min): 3 h 40 min Total Sleep Time (hours, min):  2 h 42 min   Percent REM (%):    21.6 %   Calculated pAHI (per hour): 70.7      REM pAHI: 65.1   NREM pAHI: 72.3 Supine AHI: 91.5   Oxygen Saturation (%) Mean: 90  Minimum oxygen saturation (%):         67   O2 Saturation Range (%): 67-98  O2Saturation (minutes) <=88%: 47.1 min  Pulse Mean (bpm):    69  Pulse Range (50-88)   IMPRESSION: This HST recording time was limited to 3 h and 40 minutes, impacting the validity of collected data.  Severe OSA (obstructive sleep apnea) was noted even in this very limited time, at an AHI of 70/h.  Supine sleep further exacerbated the degree of apnea. There was a significant proportion of hypoxemic sleep noted, at 47 minutes out of a sleep time of 162 minutes.   RECOMMENDATION: The  patient will be invited to an attended sleep study for PAP titration which will give [place] opportunity to also titrate him to oxygen as needed, and to try the best fitting interface for him.  PS: This is an urgent return for PAP titration- please mark on REPORT.   INTERPRETING PHYSICIAN:  Larey Seat, MD   [facility] and [facility] Sleep Board certified by The [employer] of Sleep Medicine and Fellow of the [employer] of Neurology. Medical Director of [employer].

## 2020-10-22 NOTE — Addendum Note (Signed)
Addended by: Larey Seat on: 10/22/2020 01:53 PM   Modules accepted: Orders

## 2020-10-22 NOTE — Progress Notes (Signed)
IMPRESSION: This HST recording time was limited to 3 h and 40 minutes, impacting the validity of collected data.  Severe OSA (obstructive sleep apnea) was noted even in this very limited time, at an AHI of 70/h.  Supine sleep further exacerbated the degree of apnea. There was a significant proportion of hypoxemic sleep noted, at 47 minutes out of a sleep time of 162 minutes.   RECOMMENDATION: The patient will be invited to an attended sleep study for PAP titration which will give [place] opportunity to also titrate him to oxygen as needed, and to try the best fitting interface for him.  PS: This is an urgent return for PAP titration- please mark on REPORT.   INTERPRETING PHYSICIAN:  Larey Seat, MD  [facility] and [facility] Sleep

## 2020-10-23 ENCOUNTER — Telehealth: Payer: Self-pay | Admitting: Neurology

## 2020-10-23 ENCOUNTER — Encounter: Payer: Self-pay | Admitting: Neurology

## 2020-10-23 NOTE — Telephone Encounter (Signed)
Called patient to discuss sleep study results. No answer at this time. LVM for the patient to call back.  Will send a mychart message as well for now.

## 2020-10-23 NOTE — Telephone Encounter (Signed)
-----   Message from Larey Seat, MD sent at 10/22/2020  1:53 PM EST ----- IMPRESSION: This HST recording time was limited to 3 h and 40 minutes, impacting the validity of collected data.  Severe OSA (obstructive sleep apnea) was noted even in this very limited time, at an AHI of 70/h.  Supine sleep further exacerbated the degree of apnea. There was a significant proportion of hypoxemic sleep noted, at 47 minutes out of a sleep time of 162 minutes.   RECOMMENDATION: The patient will be invited to an attended sleep study for PAP titration which will give [place] opportunity to also titrate him to oxygen as needed, and to try the best fitting interface for him.  PS: This is an urgent return for PAP titration- please mark on REPORT.   INTERPRETING PHYSICIAN:  Larey Seat, MD  [facility] and [facility] Sleep

## 2020-11-03 ENCOUNTER — Other Ambulatory Visit: Payer: Self-pay | Admitting: Internal Medicine

## 2020-11-08 ENCOUNTER — Other Ambulatory Visit: Payer: Self-pay | Admitting: Internal Medicine

## 2020-11-08 DIAGNOSIS — K219 Gastro-esophageal reflux disease without esophagitis: Secondary | ICD-10-CM

## 2020-11-13 ENCOUNTER — Telehealth: Payer: Self-pay

## 2020-11-13 NOTE — Telephone Encounter (Signed)
Patient has requested a call back from nurse

## 2020-11-13 NOTE — Telephone Encounter (Signed)
Called the patient back, there was no answer.  Left a voicemail asking for the patient to return to call.

## 2020-11-13 NOTE — Telephone Encounter (Signed)
Patient had called and left a message with the sleep lab asking for a call back.  Attempted to contact the patient.  There was no answer, left a voicemail for the patient to return my call to the main office number

## 2020-11-27 ENCOUNTER — Encounter: Payer: Self-pay | Admitting: Internal Medicine

## 2020-11-27 NOTE — Progress Notes (Signed)
Annual  Screening/Preventative Visit  & Comprehensive Evaluation & Examination   Future Appointments  Date Time Provider [place]  01/02/2021  4:00 PM Unk Pinto, MD [facility] None  03/11/2021 11:00 AM Liane Comber, NP [facility] None  06/12/2021  9:30 AM Unk Pinto, MD [facility] None  12/04/2021  9:00 AM Unk Pinto, MD [facility] None                                               This very nice 65 y.o. [name]  presents for a Screening /Preventative Visit & comprehensive evaluation and management of multiple medical co-morbidities.  Patient has been followed for HTN, HLD, Prediabetes and Vitamin D Deficiency.  Patient's Gout controlled on Allopurinol. Patient has hx/o (+) Barrett's /GERD followed by Dr Loletha Carrow.      HTN predates circa 2005. Patient's BP has been controlled at home.  Today's BP is moderately elevated at 180/80. Patient denies any cardiac symptoms as chest pain, palpitations, shortness of breath, dizziness, but does have severe LE swelling.  Wt Readings from Last 3 Encounters:  11/28/20 (!) 352 lb (159.7 kg)  08/13/20 (!) 336 lb (152.4 kg)   weight up #18 since December     Patient's hyperlipidemia is not controlled with diet when last off Rosuvastatin.  Patient denies myalgias or other medication SE's. Last lipids were not at goal:  Lab Results  Component Value Date   CHOL 170 08/13/2020   HDL 40 08/13/2020   LDLCALC 108 (H) 08/13/2020   TRIG 114 08/13/2020   CHOLHDL 4.3 08/13/2020                                           Patient has  Long hx/o Low Testosterone ("228" /2001) and has been on  replacement therapy by gel with improved stamina & sense of well being.      Patient has  Morbid Obesity  (BMI 47.74)  And patient admit gluttonous compulsive overeating  With hx/o glucose intolerance /prediabetes  (A1c 5.9% /2010) and patient denies reactive hypoglycemic symptoms, visual blurring, diabetic polys or paresthesias. Last A1c was not  at goal:    Lab Results  Component Value Date   HGBA1C 6.0 (H) 08/13/2020         Finally, patient has history of Vitamin D Deficiency ("17"/2008)and last vitamin D was at goal:  Lab Results  Component Value Date   VD25OH 86 08/13/2020     Current Outpatient Medications on File Prior to Visit  Medication Sig  . acyclovir  800 MG tablet TAKE 1 TABLET   DAILY   . allopurinol  300 MG tablet Take 1 tablet Daily to Prevent Gout  . ALPRAZolam 1 MG tablet TAKE 1/2 TO 1 TABLET 2 TO 3 TIMES DAILY prn  . aspirin EC 81 MG tablet Take  daily.  Marland Kitchen atenolol  100 MG tablet TAKE 1 TABLET   DAILY   . buPROPion-XL) 300 MG  TAKE 1 TABLET  IN  THE MORNING   . VITAMIN B 12 tab Take 1 tablet daily.  Marland Kitchen esomeprazole 40 MG cap TAKE 1 CAPSULE  TWICE DAILY   . EUCRISA 2 % OINT APPLY TWO TIMES A DAY  . fenofibrate 160 MG tablet Take 1 tablet  Daily   . fish oil-omega-3  1000 MG caps Take  daily.  Marland Kitchen FLAX SEEDS Take daily  . furosemide  40 MG tablet TAKE 1 TABLET  TWICE DAILY   . magnesium  400 MG tablet Take  2  times daily.   . methocarbamol  500 MG tablet Take 1 tablet at bedtime   . montelukast  10 MG tablet TAKE 1 TABLET  DAILY FOR ALLERGIES  . Multiple Vitamin w/Minerals Take 1 tablet daily.  . potassium chloride  10 MEQ tab TAKE 1 TABLET  3  TIMES DAILY  . rosuvastatin  40 MG tablet Patient not taking: Reported on 08/13/2020  . tadalafil 20 MG tablet Take 1/2 to 1 tablet every 2 to 3 days as needed f  . terazosin  2 MG capsule TAKE ONE CAPSULE  EVERY NIGHT   . TESTIM 50 MG/5GM (1%) GEL Apply 1 Testosterone gel tube Daily  . valsartan  320 MG tablet Take 1 tablet every morning for BP  . vitamin C  500 MG tablet Take  daily.   . Vitamin D  50,000 u TAKE 1 CAPSULE   DAILY   . Zinc 50 MG TABS Take  daily.    No Known Allergies   Past Medical History:  Diagnosis Date  . Allergy   . Anxiety   . Asthma    as a child  . Barrett's esophagus   . GERD (gastroesophageal reflux disease)   . Gout     never had a flare up of gout  . H/O hiatal hernia   . Hyperlipidemia   . Hypertension   . Prediabetes   . Sleep apnea    does not use  cpap  . Vitamin D deficiency     Health Maintenance  Topic Date Due  . COLONOSCOPY  06/05/2020  . TETANUS/TDAP  08/02/2023  . INFLUENZA VACCINE  Completed  . COVID-19 Vaccine  Completed  . HPV VACCINES  Aged Out  . Hepatitis C Screening  Discontinued  . HIV Screening  Discontinued    Immunization History  Administered Date(s) Administered  . Influenza Inj Mdck Quad With Preservative 07/02/2017, 06/15/2018  . Influenza Split 06/28/2014, 06/28/2015  . Influenza,inj,Quad PF,6+ Mos 06/19/2019  . Influenza,inj,quad, With Preservative 08/11/2016  . Influenza-Unspecified 09/01/2012  . PFIZER(Purple Top)SARS-COV-2 Vaccination 10/17/2019, 11/07/2019, 09/02/2020  . PPD Test 06/28/2014, 01/18/2016, 08/11/2016, 10/12/2017, 11/01/2018, 11/21/2019  . Pneumococcal-Unspecified 09/01/1998  . Tdap 08/01/2013  . Zoster Recombinat (Shingrix) 06/19/2019, 08/23/2019   Last Colon - 07/03/2009 - Dr Deatra Ina and recc 10 year f/u due Nov 2020.                     - 10.05.2020 - Dr Loletha Carrow - poor prep - recc 1 year f/u colon  Last EGD   -  10.05.2020 - Dr Loletha Carrow  - Neg Bx for  hx/o Barrett's - Recc 3 yr f/u   Past Surgical History:  Procedure Laterality Date  . CLOSED REDUCTION NASAL FRACTURE N/A 06/10/2016   Procedure: CLOSED REDUCTION NASAL FRACTURE FOREHEAD SUTURE REMOVAL;  Surgeon: [name], DO;  Location: [facility];  Service: Plastics;  Laterality: N/A;  . COLONOSCOPY    . [place]  . Left knee Arthroscopy    . Right knee arthroscopy    . UPPER GASTROINTESTINAL ENDOSCOPY     last egd 12-7*2012    Family History  Problem Relation Age of Onset  . Arthritis Mother   .  Lung cancer Mother   . Heart disease Mother   . Colon cancer Maternal Uncle   . Stroke Brother   . Prostate cancer Father   . Esophageal cancer  Neg Hx   . Rectal cancer Neg Hx   . Stomach cancer Neg Hx   . Liver cancer Neg Hx   . Pancreatic cancer Neg Hx   . Colon polyps Neg Hx   . Crohn's disease Neg Hx     Social History   Socioeconomic History  . Marital status: Married    Spouse name: Malachy Mood  . Number of children: 1 son & 2 GC  Occupational History  . Occupation: Event organiser  Tobacco Use  . Smoking status: Former Smoker    Years: 6.00    Types: Cigarettes    Quit date: 02/07/2013    Years since quitting: 7.8  . Smokeless tobacco: Never Used  Vaping Use  . Vaping Use: Never used  Substance and Sexual Activity  . Alcohol use: No  . Drug use: No  . Sexual activity: Yes    Partners: Female   ROS Constitutional: Denies fever, chills, weight loss/gain, headaches, insomnia,  night sweats or change in appetite. Does c/o fatigue. Eyes: Denies redness, blurred vision, diplopia, discharge, itchy or watery eyes.  ENT: Denies discharge, congestion, post nasal drip, epistaxis, sore throat, earache, hearing loss, dental pain, Tinnitus, Vertigo, Sinus pain or snoring.  Cardio: Denies chest pain, palpitations, irregular heartbeat, syncope, dyspnea, diaphoresis, orthopnea, PND, claudication or edema Respiratory: denies cough, dyspnea, DOE, pleurisy, hoarseness, laryngitis or wheezing.  Gastrointestinal: Denies dysphagia, heartburn, reflux, water brash, pain, cramps, nausea, vomiting, bloating, diarrhea, constipation, hematemesis, melena, hematochezia, jaundice or hemorrhoids Genitourinary: Denies dysuria, frequency, urgency, nocturia, hesitancy, discharge, hematuria or flank pain Musculoskeletal: Denies arthralgia, myalgia, stiffness, Jt. Swelling, pain, limp or strain/sprain. Denies Falls. Skin: Denies puritis, rash, hives, warts, acne, eczema or change in skin lesion Neuro: No weakness, tremor, incoordination, spasms, paresthesia or pain Psychiatric: Denies confusion, memory loss or sensory loss. Denies  Depression. Endocrine: Denies change in weight, skin, hair change, nocturia, and paresthesia, diabetic polys, visual blurring or hyper / hypo glycemic episodes.  Heme/Lymph: No excessive bleeding, bruising or enlarged lymph nodes.  Physical Exam  BP (!) 180/80   Pulse 72   Temp 97.7 F (36.5 C)   Resp 18   Ht 6' (1.829 m)   Wt (!) 352 lb (159.7 kg)   BMI 47.74 kg/m   General Appearance:  Morbidly obese and in no apparent distress.  Eyes: PERRLA, EOMs, conjunctiva no swelling or erythema, normal fundi and vessels. Sinuses: No frontal/maxillary tenderness ENT/Mouth: EACs patent / TMs  nl. Nares clear without erythema, swelling, mucoid exudates. Oral hygiene is good. No erythema, swelling, or exudate. Tongue normal, non-obstructing. Tonsils not swollen or erythematous. Hearing normal.  Neck: Supple, thyroid not palpable. No bruits, nodes or JVD. Respiratory: Respiratory effort normal.  BS equal and clear bilateral without rales, rhonci, wheezing or stridor. Cardio: Heart sounds are normal with regular rate and rhythm and no murmurs, rubs or gallops. Peripheral pulses are obscured by severe depend edema to mid thigh. No aortic or femoral bruits. Chest: symmetric with normal excursions and percussion.  Abdomen: Soft, with Nl bowel sounds. Nontender, no guarding, rebound, hernias, masses, or organomegaly.  Lymphatics: Non tender without lymphadenopathy.  Musculoskeletal: Full ROM all peripheral extremities, joint stability, 5/5 strength, and normal gait. Skin: Warm and dry without rashes, lesions, cyanosis, clubbing or  ecchymosis.  Neuro: Cranial nerves intact, reflexes  equal bilaterally. Normal muscle tone, no cerebellar symptoms. Sensation intact.  Pysch: Alert and oriented X 3 with normal affect, insight and judgment appropriate.   Assessment and Plan  1. Annual Preventative/Screening Exam   2. Hyperlipidemia, mixed  - EKG 12-Lead - [place], RETROPERITNL ABD,  LTD - Lipid panel -  TSH - fenofibrate 160 MG tablet; Take  1 tablet  Daily  for Triglycerides    Dispense: 90 tablet; Refill: 3  3. Essential hypertension  - EKG 12-Lead - [place], RETROPERITNL ABD,  LTD - Urinalysis, Routine w reflex microscopic - Microalbumin / creatinine urine ratio - CBC with Differential/Platelet - COMPLETE METABOLIC PANEL WITH GFR - Magnesium - TSH - valsartan 320 MG tablet;  Take  1 tablet  Daily for BP   Disp: 90 tablet; Refill: 3  4. Glucose intolerance  - EKG 12-Lead - [place], RETROPERITNL ABD,  LTD - Hemoglobin A1c - Insulin, random  - Semaglutide-Weight Management 0.25 MG/0.5ML SOAJ; Inject 0.25 mg into the skin once a week for 28 days.  Dispense: 2 mL; Refill: 3 - Semaglutide-Weight Management 0.5 MG/0.5ML SOAJ; Inject 0.5 mg into the skin once a week for 28 days.  Dispense: 2 mL; Refill: 3 - Semaglutide-Weight Management 1 MG/0.5ML SOAJ; Inject 1 mg into the skin once a week for 28 days.  Dispense: 2 mL; Refill: 3 - Semaglutide-Weight Management 1.7 MG/0.75ML SOAJ; Inject 1.7 mg into the skin once a week for 28 days.  Dispense: 3 mL; Refill: 3 - Semaglutide-Weight Management 2.4 MG/0.75ML SOAJ; Inject 2.4 mg into the skin once a week for 28 days.  Dispense: 3 mL; Refill: 3  5. Vitamin D deficiency  - VITAMIN D 25 Hydroxy   6. Severe obstructive sleep apnea-hypopnea syndrome   7. Prediabetes  - EKG 12-Lead - [place], RETROPERITNL ABD,  LTD - Hemoglobin A1c - Insulin, random  8. Gout  - allopurinol (ZYLOPRIM) 300 MG tablet; Take  1 tablet  Daily  to Prevent Gout   Dispense: 90 tablet; Refill: 3  9. Testosterone deficiency  10. Morbid obesity with BMI of 45.0-49.9, adult (HCC)  - Semaglutide-Weight Management 0.25 MG/0.5ML SOAJ; Inject 0.25 mg into the skin once a week for 28 days.  Dispense: 2 mL; Refill: 3 - Semaglutide-Weight Management 0.5 MG/0.5ML SOAJ; Inject 0.5 mg into the skin once a week for 28 days.  Dispense: 2 mL; Refill: 3 - Semaglutide-Weight  Management 1 MG/0.5ML SOAJ; Inject 1 mg into the skin once a week for 28 days.  Dispense: 2 mL; Refill: 3 - Semaglutide-Weight Management 1.7 MG/0.75ML SOAJ; Inject 1.7 mg into the skin once a week for 28 days.  Dispense: 3 mL; Refill: 3 - Semaglutide-Weight Management 2.4 MG/0.75ML SOAJ; Inject 2.4 mg into the skin once a week for 28 days.  Dispense: 3 mL; Refill: 3  11. Aorto-iliac atherosclerosis ([place]) by CT Scam on 01/08/2018  - EKG 12-Lead - [place], RETROPERITNL ABD,  LTD  12. 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 Prostate cancer screening  - PSA  15. Screening for colorectal cancer   16. Screening for ischemic heart disease  - EKG 12-Lead  17. Screening examination for pulmonary tuberculosis  - TB Skin Test  18. FHx: heart disease  - EKG 12-Lead -  [place], RETROPERITNL ABD,  LTD  19. Former smoker  - EKG 12-Lead - [place], RETROPERITNL ABD,  LTD  20. Screening for AAA (aortic abdominal aneurysm)  - [place], RETROPERITNL ABD,  LTD  21. Fatigue, unspecified type  - Iron,Total/Total Iron Binding Cap - Vitamin B12 - Testosterone - CBC with Differential/Platelet - TSH  22. Medication management  - Urinalysis, Routine w reflex microscopic - Microalbumin / creatinine urine ratio - Vitamin B12 - Testosterone - CBC with Differential/Platelet - COMPLETE METABOLIC PANEL WITH GFR - Magnesium - Lipid panel - TSH - Hemoglobin A1c - Insulin, random - VITAMIN D 25 Hydroxy         Patient was counseled in prudent diet, weight control to achieve/maintain BMI less than 25, BP monitoring, regular exercise and medications as discussed.  Discussed med effects and SE's. Routine screening labs and tests as requested with regular follow-up as  recommended. Over 40 minutes of exam, counseling, chart review and high complex critical decision making was performed   Kirtland Bouchard, MD

## 2020-11-27 NOTE — Patient Instructions (Signed)

## 2020-11-28 ENCOUNTER — Ambulatory Visit: Payer: 59 | Admitting: Internal Medicine

## 2020-11-28 ENCOUNTER — Other Ambulatory Visit: Payer: Self-pay

## 2020-11-28 ENCOUNTER — Encounter: Payer: Self-pay | Admitting: Internal Medicine

## 2020-11-28 VITALS — BP 180/80 | HR 72 | Temp 97.7°F | Resp 18 | Ht 72.0 in | Wt 352.0 lb

## 2020-11-28 DIAGNOSIS — R5383 Other fatigue: Secondary | ICD-10-CM

## 2020-11-28 DIAGNOSIS — Z87891 Personal history of nicotine dependence: Secondary | ICD-10-CM

## 2020-11-28 DIAGNOSIS — Z0000 Encounter for general adult medical examination without abnormal findings: Secondary | ICD-10-CM

## 2020-11-28 DIAGNOSIS — E782 Mixed hyperlipidemia: Secondary | ICD-10-CM

## 2020-11-28 DIAGNOSIS — R7303 Prediabetes: Secondary | ICD-10-CM

## 2020-11-28 DIAGNOSIS — Z8249 Family history of ischemic heart disease and other diseases of the circulatory system: Secondary | ICD-10-CM

## 2020-11-28 DIAGNOSIS — E7439 Other disorders of intestinal carbohydrate absorption: Secondary | ICD-10-CM

## 2020-11-28 DIAGNOSIS — I700 Atherosclerosis of aorta: Secondary | ICD-10-CM | POA: Diagnosis not present

## 2020-11-28 DIAGNOSIS — Z125 Encounter for screening for malignant neoplasm of prostate: Secondary | ICD-10-CM

## 2020-11-28 DIAGNOSIS — E349 Endocrine disorder, unspecified: Secondary | ICD-10-CM

## 2020-11-28 DIAGNOSIS — N401 Enlarged prostate with lower urinary tract symptoms: Secondary | ICD-10-CM | POA: Insufficient documentation

## 2020-11-28 DIAGNOSIS — G4733 Obstructive sleep apnea (adult) (pediatric): Secondary | ICD-10-CM

## 2020-11-28 DIAGNOSIS — I872 Venous insufficiency (chronic) (peripheral): Secondary | ICD-10-CM

## 2020-11-28 DIAGNOSIS — Z136 Encounter for screening for cardiovascular disorders: Secondary | ICD-10-CM

## 2020-11-28 DIAGNOSIS — Z111 Encounter for screening for respiratory tuberculosis: Secondary | ICD-10-CM | POA: Diagnosis not present

## 2020-11-28 DIAGNOSIS — I10 Essential (primary) hypertension: Secondary | ICD-10-CM | POA: Diagnosis not present

## 2020-11-28 DIAGNOSIS — N138 Other obstructive and reflux uropathy: Secondary | ICD-10-CM

## 2020-11-28 DIAGNOSIS — I708 Atherosclerosis of other arteries: Secondary | ICD-10-CM

## 2020-11-28 DIAGNOSIS — Z1211 Encounter for screening for malignant neoplasm of colon: Secondary | ICD-10-CM

## 2020-11-28 DIAGNOSIS — E559 Vitamin D deficiency, unspecified: Secondary | ICD-10-CM

## 2020-11-28 DIAGNOSIS — Z0001 Encounter for general adult medical examination with abnormal findings: Secondary | ICD-10-CM

## 2020-11-28 DIAGNOSIS — Z6841 Body Mass Index (BMI) 40.0 and over, adult: Secondary | ICD-10-CM

## 2020-11-28 DIAGNOSIS — M109 Gout, unspecified: Secondary | ICD-10-CM

## 2020-11-28 DIAGNOSIS — M1000 Idiopathic gout, unspecified site: Secondary | ICD-10-CM

## 2020-11-28 DIAGNOSIS — R7309 Other abnormal glucose: Secondary | ICD-10-CM

## 2020-11-28 DIAGNOSIS — Z79899 Other long term (current) drug therapy: Secondary | ICD-10-CM

## 2020-11-28 MED ORDER — SEMAGLUTIDE-WEIGHT MANAGEMENT 1.7 MG/0.75ML ~~LOC~~ SOAJ
1.7000 mg | SUBCUTANEOUS | 3 refills | Status: DC
Start: 2021-02-23 — End: 2021-01-02

## 2020-11-28 MED ORDER — SEMAGLUTIDE-WEIGHT MANAGEMENT 0.25 MG/0.5ML ~~LOC~~ SOAJ: 0.2500 mg | SUBCUTANEOUS | 3 refills | Status: DC

## 2020-11-28 MED ORDER — SEMAGLUTIDE-WEIGHT MANAGEMENT 1 MG/0.5ML ~~LOC~~ SOAJ
1.0000 mg | SUBCUTANEOUS | 3 refills | Status: DC
Start: 2021-01-25 — End: 2021-01-02

## 2020-11-28 MED ORDER — SEMAGLUTIDE-WEIGHT MANAGEMENT 0.5 MG/0.5ML ~~LOC~~ SOAJ: 0.5000 mg | SUBCUTANEOUS | 3 refills | Status: DC

## 2020-11-28 MED ORDER — ALLOPURINOL 300 MG PO TABS: ORAL_TABLET | ORAL | 3 refills | Status: DC

## 2020-11-28 MED ORDER — TORSEMIDE 20 MG PO TABS: ORAL_TABLET | ORAL | 1 refills | Status: DC

## 2020-11-28 MED ORDER — VALSARTAN 320 MG PO TABS: ORAL_TABLET | ORAL | 3 refills | Status: DC

## 2020-11-28 MED ORDER — FENOFIBRATE 160 MG PO TABS
ORAL_TABLET | ORAL | 3 refills | Status: DC
Start: 2020-11-28 — End: 2022-06-25

## 2020-11-28 MED ORDER — SEMAGLUTIDE-WEIGHT MANAGEMENT 2.4 MG/0.75ML ~~LOC~~ SOAJ: 2.4000 mg | SUBCUTANEOUS | 3 refills | Status: DC

## 2020-11-28 NOTE — Progress Notes (Signed)
AortaScan < 3 cm. Within normal limits, per Dr McKeown. 

## 2020-11-29 LAB — TESTOSTERONE: Testosterone: 783 ng/dL (ref 250–827)

## 2020-11-29 LAB — COMPLETE METABOLIC PANEL WITH GFR
AG Ratio: 1.8 (calc) (ref 1.0–2.5)
ALT: 23 U/L (ref 9–46)
AST: 21 U/L (ref 10–35)
Albumin: 4.5 g/dL (ref 3.6–5.1)
Alkaline phosphatase (APISO): 55 U/L (ref 35–144)
BUN: 12 mg/dL (ref 7–25)
CO2: 28 mmol/L (ref 20–32)
Calcium: 9.4 mg/dL (ref 8.6–10.3)
Chloride: 108 mmol/L (ref 98–110)
Creat: 1.08 mg/dL (ref 0.70–1.25)
GFR, Est African American: 84 mL/min/{1.73_m2} (ref >=60)
GFR, Est Non African American: 73 mL/min/{1.73_m2} (ref >=60)
Globulin: 2.5 g/dL (calc) (ref 1.9–3.7)
Glucose, Bld: 78 mg/dL (ref 65–99)
Potassium: 4.1 mmol/L (ref 3.5–5.3)
Sodium: 144 mmol/L (ref 135–146)
Total Bilirubin: 0.5 mg/dL (ref 0.2–1.2)
Total Protein: 7 g/dL (ref 6.1–8.1)

## 2020-11-29 LAB — VITAMIN B12: Vitamin B-12: 445 pg/mL (ref 200–1100)

## 2020-11-29 LAB — MAGNESIUM: Magnesium: 1.9 mg/dL (ref 1.5–2.5)

## 2020-11-29 LAB — CBC WITH DIFFERENTIAL/PLATELET
Absolute Monocytes: 814 cells/uL (ref 200–950)
Basophils Absolute: 21 cells/uL (ref 0–200)
Basophils Relative: 0.3 %
Eosinophils Absolute: 131 cells/uL (ref 15–500)
Eosinophils Relative: 1.9 %
HCT: 39.6 % (ref 38.5–50.0)
Hemoglobin: 13.6 g/dL (ref 13.2–17.1)
Lymphs Abs: 2229 cells/uL (ref 850–3900)
MCH: 31.3 pg (ref 27.0–33.0)
MCHC: 34.3 g/dL (ref 32.0–36.0)
MCV: 91.2 fL (ref 80.0–100.0)
MPV: 11.3 fL (ref 7.5–12.5)
Monocytes Relative: 11.8 %
Neutro Abs: 3705 cells/uL (ref 1500–7800)
Neutrophils Relative %: 53.7 %
Platelets: 113 10*3/uL — ABNORMAL LOW (ref 140–400)
RBC: 4.34 10*6/uL (ref 4.20–5.80)
RDW: 13 % (ref 11.0–15.0)
Total Lymphocyte: 32.3 %
WBC: 6.9 10*3/uL (ref 3.8–10.8)

## 2020-11-29 LAB — MICROALBUMIN / CREATININE URINE RATIO
Creatinine, Urine: 162 mg/dL (ref 20–320)
Microalb Creat Ratio: 7 mcg/mg creat (ref <30)
Microalb, Ur: 1.1 mg/dL

## 2020-11-29 LAB — INSULIN, RANDOM: Insulin: 7.7 u[IU]/mL

## 2020-11-29 LAB — LIPID PANEL
Cholesterol: 166 mg/dL (ref <200)
HDL: 39 mg/dL — ABNORMAL LOW (ref >=40)
LDL Cholesterol (Calc): 112 mg/dL (calc) — ABNORMAL HIGH
Non-HDL Cholesterol (Calc): 127 mg/dL (calc) (ref <130)
Total CHOL/HDL Ratio: 4.3 (calc) (ref <5.0)
Triglycerides: 64 mg/dL (ref <150)

## 2020-11-29 LAB — URINALYSIS, ROUTINE W REFLEX MICROSCOPIC
Bilirubin Urine: NEGATIVE
Glucose, UA: NEGATIVE
Hgb urine dipstick: NEGATIVE
Ketones, ur: NEGATIVE
Leukocytes,Ua: NEGATIVE
Nitrite: NEGATIVE
Protein, ur: NEGATIVE
Specific Gravity, Urine: 1.022 (ref 1.001–1.03)
pH: 6.5 (ref 5.0–8.0)

## 2020-11-29 LAB — HEMOGLOBIN A1C
Hgb A1c MFr Bld: 5.8 % of total Hgb — ABNORMAL HIGH (ref <5.7)
Mean Plasma Glucose: 120 mg/dL
eAG (mmol/L): 6.6 mmol/L

## 2020-11-29 LAB — TSH: TSH: 2.2 mIU/L (ref 0.40–4.50)

## 2020-11-29 LAB — VITAMIN D 25 HYDROXY (VIT D DEFICIENCY, FRACTURES): Vit D, 25-Hydroxy: 92 ng/mL (ref 30–100)

## 2020-11-29 LAB — PSA: PSA: 0.59 ng/mL (ref <=4.0)

## 2020-11-29 LAB — IRON, TOTAL/TOTAL IRON BINDING CAP
%SAT: 21 % (calc) (ref 20–48)
Iron: 78 ug/dL (ref 50–180)
TIBC: 378 mcg/dL (calc) (ref 250–425)

## 2020-11-29 NOTE — Progress Notes (Signed)
============================================================ ============================================================  -    Iron levels Normal  ============================================================ ============================================================  -  -  Vitamin B12 =   445  Borderline                                           Very Low  (Ideal or Goal Vit B12 is between 450 - 1,100)   Low Vit B12 may be associated with Anemia , Fatigue,   Peripheral Neuropathy, Dementia, "Brain Fog", & Depression  - Recommend take a sub-lingual form of Vitamin B12 tablet   1,000 to 5,000 mcg tab that you dissolve under your tongue /Daily   - Can get Baron Sane - best price at [employer] or on [employer] ============================================================ ============================================================  - PSA - Low - Great -No Cancer ============================================================ ============================================================  - Testosterone - Normal - at a good Healthy Level ============================================================ ============================================================  - Total Chol = 166   Excellent   - Very low risk for Heart Attack  / Stroke ========================================================  - But the Bad /Dangerous LDL Chol = 112 - is too high        (Ideal or Goal is less than 70  !  )   - Recommend restart your Crestor / Rosuvastatin 40 mg at                             1/2 tab = 20 mg every other day (even days of month)  ============================================================ ============================================================  - A1c = 5.8% - is a little better - down from 6.0% , but still elevated in   the borderline and early or pre-diabetes range which has the same   300% increased risk for heart attack, stroke, cancer and   Alzheimer- type Vascular Dementia  as full blown diabetes.   But the good news is that diet, exercise with  weight loss can cure the early diabetes at this point. ============================================================ ============================================================  - Vitamin D = 92 - Excellent  ! ============================================================ ============================================================  - All Else - CBC - Kidneys - Electrolytes - Liver - Magnesium & Thyroid    - all  Normal / OK ============================================================ ============================================================

## 2020-12-11 ENCOUNTER — Telehealth: Payer: Self-pay

## 2020-12-11 NOTE — Telephone Encounter (Signed)
Prior authorization for [facility] submitted and denied. Patient notified via [place].

## 2021-01-01 NOTE — Progress Notes (Signed)
Future Appointments  Date Time Provider [place]  01/02/2021  4:00 PM Unk Pinto, MD [facility] None  03/11/2021 11:00 AM Liane Comber, NP [facility] None  06/12/2021  9:30 AM Unk Pinto, MD [facility] None  12/04/2021  9:00 AM Unk Pinto, MD [facility] None    History of Present Illness:      This very nice 65 yo [name] with HTN, HLD, PreDM, Hx/o Gout, returns for 6 week f/u  for BP Surveilance of 180/80 at last OV on Atenolol & Furosemide. He had Valsartan 320 mg added & Torsemide 20 mg bid replaced Lasix for worsening venous insufficiency edema. His weight was up to 352 #  (BMI 47.7+) . Mancel Parsons was requested, but not covered by his Ins co. Patient is PreDiabetic - last A1c's  were 6.0 & 5.8%  Medications  .  testosterone (TESTIM) 50 MG/5GM (1%) GEL, Apply 1 Testosterone gel tube Daily  .  atenolol  100 MG tablet, TAKE 1 TABLET  DAILY  .  fenofibrate 160 MG tablet, Take  1 tablet  Daily   .  furosemide  40 MG tablet, TAKE 1 TABLET   TWICE DAILY   .  Rosuvastatin 40 MG tablet,  (Patient not taking: No sig reported)  .  tadalafil (CIALIS) 20 MG tablet, Take 1/2 to 1 tablet every 2 to 3 days as needed   .  terazosin  2 MG capsule, TAKE ONE CAPSULE  EVERY NIGHT AT BEDTIME FOR FOR BLOOD PRESSURE AND PROSTATE  .  torsemide ( 20 MG tablet, Take  1 tablet  2x /day - Morning /Evening for severe leg swelling  .  valsartan 320 MG tablet, Take  1 tablet  Daily for BP  .  montelukast (SINGULAIR) 10 MG tablet, TAKE 1 TABLET   DAILY FOR ALLERGIES  .  allopurinol  300 MG tablet, Take  1 tablet  Daily  to Prevent Gout .  aspirin EC 81 MG tablet, Take  daily.  .  Cyanocobalamin (VITAMIN B 12 PO), Take 1 tablet b daily.   Marland Kitchen  acyclovir (ZOVIRAX) 800 MG tablet, TAKE 1 TABLETDAILY FOR HSV .  ALPRAZolam (XANAX) 1 MG tablet, TAKE 1/2 TO 1 TAB 2 TO 3 TIMES DAILY ONLY IF NEEDED  FOR ANXIETY ATTACK, LIMIT 5 DAYS PER WEEK TO AVOID  ADDICTION AND DEMENTIA .  buPROPion  (WELLBUTRIN XL) 300 MG 24 hr tablet, TAKE 1 TABLET  IN  THE MORNING FOR MOOD, FOCUS AND CONCENTRATION .  esomeprazole 40 MG capsule, TAKE 1 CAPSULE  TWICE DAILY  .  EUCRISA 2 % OINT, APPLY 1 APPLICATION TOPICALLY TWO TIMES A DAY .  fish oil-omega-3  1000 MG capsule, Take 1 g daily. Marland Kitchen  FLAX SEEDS  .  magnesium 400 MG tablet, Take 400 mg  times daily.  .  methocarbamol  500 MG tablet, Take 1 tablet at bedtime for Muscle Spasm. .  Multiple Vitamin , Take 1 tablet  daily. .  potassium chloride 10 MEQ tablet, TAKE 1 TABLET  3  TIMES DAILY .  Semaglutide-Weight Management 0.5 MG/0.5ML SOAJ, Inject 0.5 mg into the skin once a week for 28 days. Derrill Memo ON 01/25/2021] Semaglutide-Weight Management 1 MG/0.5ML SOAJ, Inject 1 mg into the skin once a week for 28 days. Derrill Memo ON 02/23/2021] Semaglutide-Weight Management 1.7 MG/0.75ML , Inject 1.7 mg into the skin once a week for 28 days. Derrill Memo ON 03/24/2021] Semaglutide-Weight Management 2.4 MG/0.75ML, Inject 2.4 mg into the skin  once a week for 28 days. .  vitamin C  500 MG tablet, Take 5 daily.  .  Vitamin D 50,000 u, TAKE 1 CAPSULE   DAILY FOR SEVERE VIT D  DEFICIENCY .  Zinc 50 MG TABS, Take  daily.  Problem list He has Essential hypertension; Hyperlipidemia, mixed; History of tobacco abuse; Gastroesophageal reflux disease; Prediabetes; Gout; Vitamin D deficiency; Medication management; Morbid obesity with BMI of 45.0-49.9, adult ([place]); Testosterone deficiency; Internal hemorrhoids; Aorto-iliac atherosclerosis ([place]) by CT Scan on 01/08/2018; Major depression in partial remission ([place]); Epistaxis, recurrent; Sleep disorder; Stenosing tenosynovitis of thumb; FHx: heart disease; Former smoker; Abnormal glucose; Class 3 severe obesity due to excess calories with serious comorbidity and body mass index (BMI) of 40.0 to 44.9 in adult [facility]); Anxiety; Chronic intermittent hypoxia with obstructive sleep apnea; Severe obstructive sleep apnea-hypopnea syndrome;  Sleep-related headache; Non-restorative sleep; Snoring; and BPH with obstruction/lower urinary tract symptoms on their problem list.   Observations/Objective:  BP 140/88   Pulse 67   Temp (!) 97.5 F (36.4 C)   Resp 16   Ht 6' (1.829 m)   Wt (!) 351 lb 12.8 oz (159.6 kg)   SpO2 95%   BMI 47.71 kg/m   HEENT - WNL. Neck - supple.  Chest - Clear equal BS. Cor - Nl HS. RRR w/o sig MGR. PP 1(+). 3-4 + edema to the knees. e MS- FROM w/o deformities.  Gait Nl. Neuro -  Nl w/o focal abnormalities.  Assessment and Plan:  1. Essential hypertension   2. Peripheral edema   3. Venous stasis of both lower extremities   4. Morbid obesity with body mass index of 45.0-49.9 in adult ([place])   5. Abnormal glucose   Starting on Sx of - Semaglutide, 1 MG/DOSE, (OZEMPIC, 1 MG/DOSE,) 4 MG/3ML SOPN; Inject 1 mg (0.75 ml)     into the Skin - Once Weekly for Diabetes  Disp: 9 mL; Refill: 0  6. Prediabetes  - Semaglutide, 1 MG/DOSE, (OZEMPIC, 1 MG/DOSE,) 4 MG/3ML SOPN; Inject 1 mg (0.75 ml)     into the Skin      Once Weekly     for Diabetes  Dispense: 9 mL; Refill: 0  Follow Up Instructions:        I discussed the assessment and treatment plan with the patient. The patient was provided an opportunity to ask questions and all were answered. The patient agreed with the plan and demonstrated an understanding of the instructions.       The patient was advised to call back or seek an in-person evaluation if the symptoms worsen or if the condition fails to improve as anticipated.   Kirtland Bouchard, MD

## 2021-01-02 ENCOUNTER — Ambulatory Visit: Payer: 59 | Admitting: Internal Medicine

## 2021-01-02 ENCOUNTER — Other Ambulatory Visit: Payer: Self-pay

## 2021-01-02 ENCOUNTER — Encounter: Payer: Self-pay | Admitting: Internal Medicine

## 2021-01-02 VITALS — BP 140/88 | HR 67 | Temp 97.5°F | Resp 16 | Ht 72.0 in | Wt 351.8 lb

## 2021-01-02 DIAGNOSIS — R609 Edema, unspecified: Secondary | ICD-10-CM

## 2021-01-02 DIAGNOSIS — R7303 Prediabetes: Secondary | ICD-10-CM

## 2021-01-02 DIAGNOSIS — I10 Essential (primary) hypertension: Secondary | ICD-10-CM | POA: Diagnosis not present

## 2021-01-02 DIAGNOSIS — I878 Other specified disorders of veins: Secondary | ICD-10-CM

## 2021-01-02 DIAGNOSIS — Z6841 Body Mass Index (BMI) 40.0 and over, adult: Secondary | ICD-10-CM

## 2021-01-02 DIAGNOSIS — R7309 Other abnormal glucose: Secondary | ICD-10-CM

## 2021-01-02 MED ORDER — OZEMPIC (1 MG/DOSE) 4 MG/3ML ~~LOC~~ SOPN: PEN_INJECTOR | SUBCUTANEOUS | 0 refills | Status: DC

## 2021-01-02 MED ORDER — DEXAMETHASONE 2 MG PO TABS: ORAL_TABLET | ORAL | 0 refills | Status: DC

## 2021-01-25 ENCOUNTER — Telehealth: Payer: Self-pay | Admitting: *Deleted

## 2021-01-25 NOTE — Telephone Encounter (Signed)
Informed patient of denial of Ozempic by his insurance company.

## 2021-02-04 ENCOUNTER — Ambulatory Visit: Payer: 59 | Admitting: Internal Medicine

## 2021-02-04 ENCOUNTER — Other Ambulatory Visit: Payer: Self-pay

## 2021-02-04 ENCOUNTER — Encounter: Payer: Self-pay | Admitting: Internal Medicine

## 2021-02-04 VITALS — BP 140/98 | HR 87 | Temp 97.9°F | Resp 18 | Ht 72.0 in | Wt 333.2 lb

## 2021-02-04 DIAGNOSIS — I10 Essential (primary) hypertension: Secondary | ICD-10-CM

## 2021-02-04 DIAGNOSIS — E349 Endocrine disorder, unspecified: Secondary | ICD-10-CM

## 2021-02-04 DIAGNOSIS — N528 Other male erectile dysfunction: Secondary | ICD-10-CM

## 2021-02-04 DIAGNOSIS — I878 Other specified disorders of veins: Secondary | ICD-10-CM

## 2021-02-04 DIAGNOSIS — G8929 Other chronic pain: Secondary | ICD-10-CM

## 2021-02-04 DIAGNOSIS — Z79899 Other long term (current) drug therapy: Secondary | ICD-10-CM

## 2021-02-04 DIAGNOSIS — M545 Low back pain, unspecified: Secondary | ICD-10-CM

## 2021-02-04 MED ORDER — CYCLOBENZAPRINE HCL 10 MG PO TABS: ORAL_TABLET | ORAL | 0 refills | Status: AC

## 2021-02-04 MED ORDER — DEXAMETHASONE 2 MG PO TABS: ORAL_TABLET | ORAL | 0 refills | Status: DC

## 2021-02-04 MED ORDER — TADALAFIL 20 MG PO TABS: ORAL_TABLET | ORAL | 0 refills | Status: DC

## 2021-02-04 MED ORDER — TESTOSTERONE 50 MG/5GM (1%) TD GEL: TRANSDERMAL | 2 refills | Status: DC

## 2021-02-04 NOTE — Progress Notes (Signed)
Future Appointments  Date Time Provider [place]  02/04/2021  4:30 PM Unk Pinto, MD [facility] None  03/11/2021 11:00 AM Liane Comber, NP [facility] None  06/12/2021  9:30 AM Unk Pinto, MD [facility] None  12/04/2021  9:00 AM Unk Pinto, MD [facility] None    History of Present Illness: HTN, HLD, PreDM, Hx/o Gout,      Patient is a very nice 65 yo [name] with HTN, HLD, PreDM, Hx/o Gout, severe Morbid Obesity (BMI 45+) who is completing a 1 month trial with samples of Ozempic and unfortunately  His insurance will not cover Wegovy or Ozempic. His weight is down 18# over the last month - probably in larger part due to his diuretics.  He has experienced some "severe" leg cramps. He reports decreased appetite on the Ozempic. He's still having LBP which did improve with the steroid taper.  He requests refill on gFlexeril in lieu of the Methocarbamol.   Wt Readings from Last 3 Encounters:  02/04/21 (!) 333 lb 3.2 oz (151.1 kg)  01/02/21 (!) 351 lb 12.8 oz (159.6 kg)   Medications  .  TESTIM 50 MG/5GM (1%) GEL, Apply 1 Testosterone gel tube Daily .  atenolol  100 MG tablet, TAKE 1 TABLET  DAILY  .  fenofibrate 160 MG, Take  1 tablet  Daily   .  tadalafil  20 MG , Take 1/2 to 1 tablet  every 2 to 3 days as needed  .  terazosin  2 MG , TAKE ONE CAPSULE  AT BEDTIME  .  torsemide 20 MG , Take  1 tablet  2x /day - Morning [name]  .  valsartan  320 MG tablet, Take  1 tablet  Daily .  montelukast  10 MG tablet, TAKE 1 TABLET DAILY  .  allopurinol 300 MG , Take  1 tablet  Daily  .  aspirin EC 81 MG tablet, Take  daily. Marland Kitchen  VITAMIN B 12 , Take 1 tablet  daily. Marland Kitchen  acyclovir  800 MG , TAKE 1 TABLET  DAILY  .  ALPRAZolam  1 MG , TAKE 1/2 TO 1 TABLET 2 TO 3 TIMES DAILY ONLY IF NEEDED .  buPROPion-XL 300 MG , TAKE 1 TABLET IN  THE MORNING  .  esomeprazole 40 MG, TAKE 1 CAPSULE  TWICE DAILY  .  EUCRISA 2 % OINT, APPLY  TWO TIMES A DAY .  fish oil-omega-3  1000 MG capsule,  Take 1 g  daily. Marland Kitchen  FLAX SEEDS , Take daily .  magnesium 400 MG t, Take 2 times daily.  .  MultiVit w/Minerals, Take 1 tablet  daily. .  potassium chloride  10 MEQ, TAKE 1 TABLET 3  TIMES DAILY .  vitamin C  500 MG , Take 500 mg by mouth daily.  .  Vitamin D 50,000 u, TAKE 1 CAPSULE   DAILY  .  Zinc 50 MG , Take  daily.  Problem list He has Essential hypertension; Hyperlipidemia, mixed; History of tobacco abuse; Gastroesophageal reflux disease; Prediabetes; Gout; Vitamin D deficiency; Medication management; Morbid obesity with BMI of 45.0-49.9, adult ([place]); Testosterone deficiency; Internal hemorrhoids; Aorto-iliac atherosclerosis ([place]) by CT Scan on 01/08/2018; Major depression in partial remission ([place]); Epistaxis, recurrent; Sleep disorder; Stenosing tenosynovitis of thumb; FHx: heart disease; Former smoker; Abnormal glucose; Class 3 severe obesity due to excess calories with serious comorbidity and body mass index (BMI) of 40.0 to 44.9 in adult [facility]); Anxiety; Chronic intermittent hypoxia with obstructive sleep apnea;  Severe obstructive sleep apnea-hypopnea syndrome; Sleep-related headache; Non-restorative sleep; Snoring; and BPH with obstruction/lower urinary tract symptoms on their problem list.   Observations/Objective:  BP 140/98    P 87   T 97.9 F   R  18   Ht 6'   Wt (!) 333 lb    SpO2 97%   BMI 45.19   HEENT - WNL. Neck - supple.  Chest - Clear equal BS. Cor - Nl HS. RRR w/o sig MGR. PP obscured by 4+ pretibial/ankle/pedal edema.   MS- FROM w/o deformities.  Gait Nl. Neuro -  Nl w/o focal abnormalities.   Assessment and Plan:  1. Essential hypertension   - CBC with Differential/Platelet - COMPLETE METABOLIC PANEL WITH GFR  2. Venous stasis of both lower extremities  - CBC with Differential/Platelet - COMPLETE METABOLIC PANEL WITH GFR  3. Medication management  - CBC with Differential/Platelet - COMPLETE METABOLIC PANEL WITH GFR  4. Chronic low back  pain  - dexamethasone  2 MG; Take 1 tab 3 x day - 5 days, then 2 x day - 5 days, then 1 tab daily  Disp: 30 tab;  - cyclobenzaprine 10 MG; Take  1/2 to 1 tablet  3 x /day   as needed for Muscle Spasm  Disp: 90 tablet   Follow Up Instructions:        I discussed the assessment and treatment plan with the patient. The patient was provided an opportunity to ask questions and all were answered. The patient agreed with the plan and demonstrated an understanding of the instructions.       The patient was advised to call back or seek an in-person evaluation if the symptoms worsen or if the condition fails to improve as anticipated.    Kirtland Bouchard, MD

## 2021-02-05 LAB — COMPLETE METABOLIC PANEL WITH GFR
AG Ratio: 1.7 (calc) (ref 1.0–2.5)
ALT: 38 U/L (ref 9–46)
AST: 46 U/L — ABNORMAL HIGH (ref 10–35)
Albumin: 4.1 g/dL (ref 3.6–5.1)
Alkaline phosphatase (APISO): 59 U/L (ref 35–144)
BUN/Creatinine Ratio: 14 (calc) (ref 6–22)
BUN: 18 mg/dL (ref 7–25)
CO2: 29 mmol/L (ref 20–32)
Calcium: 9.4 mg/dL (ref 8.6–10.3)
Chloride: 106 mmol/L (ref 98–110)
Creat: 1.29 mg/dL — ABNORMAL HIGH (ref 0.70–1.25)
GFR, Est African American: 68 mL/min/{1.73_m2} (ref >=60)
GFR, Est Non African American: 59 mL/min/{1.73_m2} — ABNORMAL LOW (ref >=60)
Globulin: 2.4 g/dL (calc) (ref 1.9–3.7)
Glucose, Bld: 97 mg/dL (ref 65–99)
Potassium: 4.1 mmol/L (ref 3.5–5.3)
Sodium: 144 mmol/L (ref 135–146)
Total Bilirubin: 0.7 mg/dL (ref 0.2–1.2)
Total Protein: 6.5 g/dL (ref 6.1–8.1)

## 2021-02-05 LAB — CBC WITH DIFFERENTIAL/PLATELET
Absolute Monocytes: 741 cells/uL (ref 200–950)
Basophils Absolute: 7 cells/uL (ref 0–200)
Basophils Relative: 0.1 %
Eosinophils Absolute: 143 cells/uL (ref 15–500)
Eosinophils Relative: 2.1 %
HCT: 40.2 % (ref 38.5–50.0)
Hemoglobin: 13.5 g/dL (ref 13.2–17.1)
Lymphs Abs: 1850 cells/uL (ref 850–3900)
MCH: 31 pg (ref 27.0–33.0)
MCHC: 33.6 g/dL (ref 32.0–36.0)
MCV: 92.4 fL (ref 80.0–100.0)
MPV: 10.9 fL (ref 7.5–12.5)
Monocytes Relative: 10.9 %
Neutro Abs: 4060 cells/uL (ref 1500–7800)
Neutrophils Relative %: 59.7 %
Platelets: 99 10*3/uL — ABNORMAL LOW (ref 140–400)
RBC: 4.35 10*6/uL (ref 4.20–5.80)
RDW: 13.4 % (ref 11.0–15.0)
Total Lymphocyte: 27.2 %
WBC: 6.8 10*3/uL (ref 3.8–10.8)

## 2021-02-05 NOTE — Progress Notes (Signed)
============================================================ -   Test results slightly outside the reference range are not unusual. If there is anything important, I will review this with you,  otherwise it is considered normal test values.  If you have further questions,  please do not hesitate to contact me at the office or via My Chart.  ============================================================ ============================================================  -  CBC is Normal  & OK  ============================================================ ============================================================  -  Other tests are OK except kidney function tests    -  Look a little dehydrated    Very important to drink adequate amounts of fluids to                                                                                 prevent permanent damage    - Recommend drink at least                                6 bottles (16 ounces) of fluids /water /day = 96 Oz ~100 oz  - 100 oz = 3,000 cc or 3 liters / day  - >>                                                  That's 1 &1/2 bottles of a 2 liter soda bottle /day !  ============================================================ ============================================================

## 2021-03-08 NOTE — Progress Notes (Deleted)
FOLLOW UP  Assessment and Plan:   Atherosclerosis of aorta ([place]) Per CT 12/2017 Control blood pressure, cholesterol, glucose, increase exercise.   Hypertension Well controlled with current medications - see edema notes for trial off of amlodipine with terazosin  Monitor blood pressure at home; patient to call if consistently greater than 130/80 Continue DASH diet.   Reminder to go to the ER if any CP, SOB, nausea, dizziness, severe HA, changes vision/speech, left arm numbness and tingling and jaw pain.  Cholesterol Continue rosuvastatin and fenofibrate Continue low cholesterol diet and exercise.  Check lipid panel.   Prediabetes Continue diet and exercise.  Perform daily foot/skin check, notify office of any concerning changes.  Check A1C q75m  Morbid obesity- BMI 48 *** with prediabetes, htn, hyperlipidemia Long discussion about weight loss, diet, and exercise Recommended diet heavy in fruits and veggies and low in animal meats, cheeses, and dairy products, appropriate calorie intake, portions Weigh weekly for progress monitoring; slow steady weight loss is goal  Discussed ideal weight for height, initial goal <300 lb Portion sizes and servings reviewed; information provided Will follow up in 3 months  Vitamin D Def Above goal at last visit; he has reduced dose to 4 days a week  continue supplementation to maintain goal of 60-100 Defer Vit D level  Gout Continue allopurinolDiet discussed Check uric acid as needed  Hypogonadism - continue to monitor, states medication is helping with symptoms of low T.   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    Continue diet and meds as discussed. Further disposition pending results of labs. Discussed med's effects  and SE's.   Over 30 minutes of exam, counseling, chart review, and critical decision making was performed.   Future Appointments  Date Time Provider [place]  03/11/2021 11:00 AM Liane Comber, NP [facility] None  06/12/2021  9:30 AM Unk Pinto, MD [facility] None  12/04/2021  9:00 AM Unk Pinto, MD [facility] None    ----------------------------------------------------------------------------------------------------------------------  HPI 65 y.o. male  presents for 3 month follow up on hypertension, cholesterol, prediabetes, morbid obesity, hypogonadism, gout, depression/anxiety and vitamin D deficiency.   he has a diagnosis of depression/anxiety and is currently on wellbutrin daily and xanax 0.5-1 mg TID PRN, reports symptoms are well controlled on current regimen. he currently reports taking mainly for sleep, takes 1/2 tab 2-3 days week, has reduced use.   Patient has GERD / Barrett's (+ Bx) on Nexium and followed by Dr Loletha Carrow, next EGD due 2023.  He has home sleep study in 10/2020, per Dr. Brett Fairy severe OSA ***  *** Wegovy/ozempic wasn't covered but with benefit  he is prescribed phentermine/topiramate for weight loss. He reports had some urinary retention with topamax and stopped taking. He feels phentermine is beneficial, curbs his appetite, thinks current weight is related to fluid/ecema. While on the medication they have lost 0 lbs since last visit. They deny palpitations, anxiety, trouble sleeping, elevated BP.   BMI is There is no height or weight on file to calculate BMI., he is working on diet and exercise.  He admits hasn't been walking, stopped after he was feeling bad for 2 months after taking covid 19 vaccine, working to get back up to 2-3 miles daily.  He does watch diet, has cut back late night eating, he is very surprised with weight gain this  visit. He wonders if this may be fluid.  Not currently weighing.  Wt Readings from Last 3 Encounters:   02/04/21 (!) 333 lb 3.2 oz (151.1 kg)  01/02/21 (!) 351 lb 12.8 oz (159.6 kg)  11/28/20 (!) 352 lb (159.7 kg)   He has aorto-iliac atherosclerosis per CT 01/08/2018 His blood pressure has been controlled at home, today their BP is    He does workout. He denies chest pain, shortness of breath, dizziness. He has had persistent LE edema, did see vascular surg Dr. Oneida Alar in 05/2020, no DVT, advised weight loss and follow up if not improved ***   He is on cholesterol medication (fenofibrate, rosuvastatin 20 mg daily) and denies myalgias. His cholesterol is not at goal. The cholesterol last visit was:   Lab Results  Component Value Date   CHOL 166 11/28/2020   HDL 39 (L) 11/28/2020   LDLCALC 112 (H) 11/28/2020   TRIG 64 11/28/2020   CHOLHDL 4.3 11/28/2020    He has been working on diet and exercise for prediabetes, and denies foot ulcerations, increased appetite, nausea, paresthesia of the feet, polydipsia, polyuria, visual disturbances, vomiting and weight loss. Last A1C in the office was:  Lab Results  Component Value Date   HGBA1C 5.8 (H) 11/28/2020   Patient is on Vitamin D supplement and at goal at recent check:    Lab Results  Component Value Date   VD25OH 92 11/28/2020     Patient is on allopurinol for gout and does not report a recent flare.  Lab Results  Component Value Date   LABURIC 7.3 08/13/2020   He has a history of testosterone deficiency and is on testosterone replacement (gel). He states that the testosterone helps with his energy, libido, muscle mass. Lab Results  Component Value Date   TESTOSTERONE 783 11/28/2020    Current Medications:  Current Outpatient Medications on File Prior to Visit  Medication Sig   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   allopurinol (ZYLOPRIM) 300 MG tablet Take  1 tablet  Daily  to Prevent Gout   ALPRAZolam (XANAX) 1 MG tablet TAKE 1/2 TO 1 TABLET 2 TO 3 TIMES DAILY ONLY IF NEEDED  FOR ANXIETY  ATTACK, LIMIT 5 DAYS PER WEEK TO AVOID  ADDICTION AND DEMENTIA   aspirin EC 81 MG tablet Take 81 mg by mouth daily.   atenolol (TENORMIN) 100 MG tablet TAKE 1 TABLET BY MOUTH  DAILY FOR BLOOD PRESSURE   buPROPion (WELLBUTRIN XL) 300 MG 24 hr tablet TAKE 1 TABLET BY MOUTH IN  THE MORNING FOR MOOD, FOCUS AND CONCENTRATION   Cyanocobalamin (VITAMIN B 12 PO) Take 1 tablet by mouth daily.   cyclobenzaprine (FLEXERIL) 10 MG tablet Take  1/2 to 1 tablet  3 x /day   as needed for Muscle Spasm   esomeprazole (NEXIUM) 40 MG capsule TAKE 1 CAPSULE BY MOUTH  TWICE DAILY FOR GERD AND  BARRETT&apos;S ESOPHAGITIS   EUCRISA 2 % OINT APPLY 1 APPLICATION TOPICALLY TWO TIMES A DAY   fenofibrate 160 MG tablet Take  1 tablet  Daily  for Triglycerides (Blood Fats)   fish oil-omega-3 fatty acids 1000 MG capsule Take 1 g by mouth daily.   Flaxseed, Linseed, (FLAX SEEDS PO) Take by mouth.   magnesium oxide (MAG-OX) 400 MG tablet Take 400 mg by mouth 2 (two) times daily.    montelukast (SINGULAIR) 10 MG tablet TAKE 1 TABLET BY MOUTH  DAILY FOR  ALLERGIES   Multiple Vitamin (MULTIVITAMIN WITH MINERALS) TABS tablet Take 1 tablet by mouth daily.   potassium chloride (KLOR-CON) 10 MEQ tablet TAKE 1 TABLET BY MOUTH 3  TIMES DAILY   rosuvastatin (CRESTOR) 40 MG tablet TAKE ONE TABLET BY MOUTH DAILY FOR CHOLESTEROL (Patient not taking: Reported on 02/04/2021)   Semaglutide, 1 MG/DOSE, (OZEMPIC, 1 MG/DOSE,) 4 MG/3ML SOPN Inject 1 mg (0.75 ml)     into the Skin      Once Weekly     for Diabetes   tadalafil (CIALIS) 20 MG tablet Take  1/2 to 1 tablet  every 2 to 3 days  as needed for XXXX   terazosin (HYTRIN) 2 MG capsule TAKE ONE CAPSULE BY MOUTH EVERY NIGHT AT BEDTIME FOR FOR BLOOD PRESSURE AND PROSTATE   testosterone (TESTIM) 50 MG/5GM (1%) GEL Apply 1 Testosterone gel tube Daily   torsemide (DEMADEX) 20 MG tablet Take  1 tablet  2x /day - Morning /Evening for severe leg swelling   valsartan (DIOVAN) 320 MG tablet Take  1 tablet   Daily for BP   vitamin C (ASCORBIC ACID) 500 MG tablet Take 500 mg by mouth daily.    Vitamin D, Ergocalciferol, (DRISDOL) 1.25 MG (50000 UNIT) CAPS capsule TAKE 1 CAPSULE BY MOUTH  DAILY FOR SEVERE VIT D  DEFICIENCY   Zinc 50 MG TABS Take by mouth daily.   No current facility-administered medications on file prior to visit.     Allergies: No Known Allergies   Medical History:  Past Medical History:  Diagnosis Date   Allergy    Anxiety    Asthma    as a child   Barrett's esophagus    GERD (gastroesophageal reflux disease)    Gout    never had a flare up of gout   H/O hiatal hernia    Hyperlipidemia    Hypertension    Prediabetes    Sleep apnea    does not use  cpap   Vitamin D deficiency    Family history- Reviewed and unchanged Social history- Reviewed and unchanged   Review of Systems:  Review of Systems  Constitutional:  Negative for malaise/fatigue and weight loss.  HENT:  Negative for hearing loss and tinnitus.   Eyes:  Negative for blurred vision and double vision.  Respiratory:  Negative for cough, shortness of breath and wheezing.   Cardiovascular:  Positive for leg swelling (bil, chronic, recently worse R>L). Negative for chest pain, palpitations, orthopnea and claudication.  Gastrointestinal:  Negative for abdominal pain, blood in stool, constipation, diarrhea, heartburn, melena, nausea and vomiting.  Genitourinary: Negative.   Musculoskeletal:  Negative for back pain, falls, joint pain and myalgias.  Skin:  Negative for rash.  Neurological:  Negative for dizziness, tingling, sensory change, weakness and headaches.  Endo/Heme/Allergies:  Negative for polydipsia.  Psychiatric/Behavioral: Negative.    All other systems reviewed and are negative.  Physical Exam: There were no vitals taken for this visit. Wt Readings from Last 3 Encounters:  02/04/21 (!) 333 lb 3.2 oz (151.1 kg)  01/02/21 (!) 351 lb 12.8 oz (159.6 kg)  11/28/20 (!) 352 lb (159.7 kg)    General Appearance: Well nourished,morbidly obese patient, in no apparent distress. Eyes: PERRLA, EOMs, conjunctiva no swelling or erythema Sinuses: No Frontal/maxillary tenderness ENT/Mouth: Ext aud canals clear, TMs without erythema, bulging. Mask in place; oral exam deferred in light of pandemic and no concerns. Hearing normal.  Neck: Supple, thyroid normal.  Respiratory: Respiratory effort normal, BS  equal bilaterally without rales, rhonchi, wheezing or stridor.  Cardio: RRR with no MRGs. Significant nonpitting edema of bilateral ankles and lower legs and calves, neg homan's, non-tender, pulses limited due to edema  Abdomen: Soft, morbidly obese abdomen limiting exam; + BS.  Non tender, no guarding, rebound, palpable hernias or masses. Lymphatics: Non tender without lymphadenopathy.  Musculoskeletal: Full ROM, 5/5 strength, Normal gait Skin: Warm, dry without rashes, lesions, ecchymosis. rippled texture and some brown discoloration at R lower shin (per patient precedes edema for many years) Neuro: Cranial nerves intact. No cerebellar symptoms.  Psych: Awake and oriented X 3, normal affect, Insight and Judgment appropriate.   Izora Ribas, NP 2:32 PM [facility]

## 2021-03-11 ENCOUNTER — Ambulatory Visit: Payer: 59 | Admitting: Adult Health

## 2021-03-11 ENCOUNTER — Other Ambulatory Visit: Payer: Self-pay | Admitting: Internal Medicine

## 2021-03-11 DIAGNOSIS — E349 Endocrine disorder, unspecified: Secondary | ICD-10-CM

## 2021-03-11 MED ORDER — TESTOSTERONE CYPIONATE 200 MG/ML IM SOLN: INTRAMUSCULAR | 2 refills | Status: DC

## 2021-03-15 ENCOUNTER — Ambulatory Visit: Payer: 59 | Admitting: Adult Health

## 2021-03-15 ENCOUNTER — Encounter: Payer: Self-pay | Admitting: Adult Health

## 2021-03-15 ENCOUNTER — Other Ambulatory Visit: Payer: Self-pay

## 2021-03-15 VITALS — BP 128/88 | HR 82 | Temp 97.7°F | Wt 350.0 lb

## 2021-03-15 DIAGNOSIS — E349 Endocrine disorder, unspecified: Secondary | ICD-10-CM

## 2021-03-15 DIAGNOSIS — E559 Vitamin D deficiency, unspecified: Secondary | ICD-10-CM

## 2021-03-15 DIAGNOSIS — G4733 Obstructive sleep apnea (adult) (pediatric): Secondary | ICD-10-CM

## 2021-03-15 DIAGNOSIS — I708 Atherosclerosis of other arteries: Secondary | ICD-10-CM

## 2021-03-15 DIAGNOSIS — R7309 Other abnormal glucose: Secondary | ICD-10-CM

## 2021-03-15 DIAGNOSIS — I10 Essential (primary) hypertension: Secondary | ICD-10-CM | POA: Diagnosis not present

## 2021-03-15 DIAGNOSIS — E782 Mixed hyperlipidemia: Secondary | ICD-10-CM

## 2021-03-15 DIAGNOSIS — R7301 Impaired fasting glucose: Secondary | ICD-10-CM

## 2021-03-15 DIAGNOSIS — M1A9XX0 Chronic gout, unspecified, without tophus (tophi): Secondary | ICD-10-CM

## 2021-03-15 DIAGNOSIS — F3341 Major depressive disorder, recurrent, in partial remission: Secondary | ICD-10-CM

## 2021-03-15 DIAGNOSIS — R7303 Prediabetes: Secondary | ICD-10-CM

## 2021-03-15 DIAGNOSIS — I700 Atherosclerosis of aorta: Secondary | ICD-10-CM

## 2021-03-15 DIAGNOSIS — F419 Anxiety disorder, unspecified: Secondary | ICD-10-CM

## 2021-03-15 DIAGNOSIS — Z6841 Body Mass Index (BMI) 40.0 and over, adult: Secondary | ICD-10-CM

## 2021-03-15 DIAGNOSIS — E8881 Metabolic syndrome: Secondary | ICD-10-CM

## 2021-03-15 DIAGNOSIS — Z79899 Other long term (current) drug therapy: Secondary | ICD-10-CM

## 2021-03-15 MED ORDER — TESTOSTERONE CYPIONATE 200 MG/ML IM SOLN
200.0000 mg | Freq: Once | INTRAMUSCULAR | Status: AC
  Administered 2021-03-15: 200 mg via INTRAMUSCULAR

## 2021-03-15 MED ORDER — MOUNJARO 2.5 MG/0.5ML ~~LOC~~ SOAJ: SUBCUTANEOUS | 0 refills | Status: DC

## 2021-03-15 NOTE — Progress Notes (Signed)
Patient here for a Testosterone injection 200 mg/ml 1 ml IM injection in LEFT upper outer quadrant. Patient tolerated well and will return in 1 week for his next injection.

## 2021-03-15 NOTE — Progress Notes (Signed)
FOLLOW UP  Assessment and Plan:   Atherosclerosis of aorta ([place]) Per CT 12/2017 Control blood pressure, cholesterol, glucose, increase exercise.   Hypertension Diastolic elevated - emphasized diuretic, med compliance, reduce sodium and animal protein in diet Monitor blood pressure at home; patient to call if consistently greater than 130/85 Continue DASH diet.   Reminder to go to the ER if any CP, SOB, nausea, dizziness, severe HA, changes vision/speech, left arm numbness and tingling and jaw pain.  Cholesterol Continue fenofibrate/ rosuvastatin - if at goal update script to every other day LDL goal <100 Continue low cholesterol diet and exercise.  Check lipid panel.   Prediabetes Continue diet and exercise.  Perform daily foot/skin check, notify office of any concerning changes.  Check A1C q30m  Morbid obesity- BMI 47with prediabetes, htn, hyperlipidemia Long discussion about weight loss, diet, and exercise Recommended diet heavy in fruits and veggies and low in animal meats, cheeses, and dairy products, appropriate calorie intake, portions Weigh weekly for progress monitoring; slow steady weight loss is goal  Discussed ideal weight for height, initial goal <300 lb Benefit with ozempic but cost barrier - given mounjaro starting dose x 1 month with coupon, follow up in Christopher-3 weeks with progress.   Vitamin D Def continue supplementation to maintain goal of 60-100 Defer Vit D level  Gout Continue allopurinol, Diet discussed Check uric acid as needed  Hypogonadism - starting injection 200 mg weekly due to cost, will get here, first shot today tolerated well  - states medication is helping with symptoms of low T.   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     Continue diet and meds as discussed. Further disposition pending results of labs. Discussed med's effects and SE's.   Over 30 minutes of exam, counseling, chart review, and critical decision making was performed.   Future Appointments  Date Time Provider [place]  06/12/2021  9:30 AM Unk Pinto, MD [facility] None  12/04/2021  9:00 AM Unk Pinto, MD [facility] None    ----------------------------------------------------------------------------------------------------------------------  HPI 65 y.o. Christopher [name]  presents for 3 month follow up on hypertension, cholesterol, prediabetes, morbid obesity, hypogonadism, gout, depression/anxiety and vitamin D deficiency.   he has a diagnosis of depression/anxiety and is currently on wellbutrin daily and xanax 0.5-1 mg TID PRN, reports symptoms are well controlled on current regimen. he currently reports taking mainly for sleep, takes 1/Christopher tab 3 days week, has reduced use.   Patient has GERD / Barrett's (+ Bx) on Nexium and followed by Dr Loletha Carrow, next EGD due 2023.  He has home sleep study in Christopher/2022, per Dr. Brett Fairy severe OSA but he never followed up due to schedule, he plans to follow up next month after we discussed risks of untreated OSA and encouraged.   BMI is Body mass index is 47.47 kg/m., he is working on diet and exercise.  He does watch diet, has cut back late night eating, doing less fried more baked. Admits to increased red meat this month. He reports on home scale steady at 330 lb, has work belt on today. He rides bicycle daily. He had mild benefit only with phentermine, urinary retention with topamax. Excellent ~20 lb loss with ozempic but cost barrier. Discussed mounjaro with coupon, prediabetes and insulin resistance.  Wt Readings from Last 3 Encounters:  03/15/21 (!) 350 lb (158.8  kg)  02/04/21 (!) 333 lb 3.Christopher oz (151.1 kg)  01/02/21 (!) 351 lb 12.8 oz (159.6 kg)   He has aorto-iliac atherosclerosis per CT  01/08/2018 His blood pressure has not been controlled at home (reports systolic intermittently 78+), today their BP is BP: 128/88  He does workout. He denies chest pain, shortness of breath, dizziness.  He has had persistent LE edema, did see vascular surg Dr. Oneida Alar in 05/2020, no DVT, advised weight loss and follow up if not improved. He notes improves with intermittent steroid prescription via Dr. Melford Aase.    He is on cholesterol medication (fenofibrate, rosuvastatin 40 mg every other day per pt, just restarted as was getting let cramps taking daily) and denies myalgias. His cholesterol is not at goal. The cholesterol last visit was:   Lab Results  Component Value Date   CHOL 166 11/28/2020   HDL 39 (L) 11/28/2020   LDLCALC 112 (H) 11/28/2020   TRIG 64 11/28/2020   CHOLHDL 4.3 11/28/2020    He has been working on diet and exercise for prediabetes/insulin resistance, and denies foot ulcerations, increased appetite, nausea, paresthesia of the feet, polydipsia, polyuria, visual disturbances, vomiting and weight loss.  Last A1C in the office was:  Lab Results  Component Value Date   HGBA1C 5.8 (H) 11/28/2020   Patient is on Vitamin D supplement and at goal at recent check:    Lab Results  Component Value Date   VD25OH 92 11/28/2020     Patient is on allopurinol for gout and does not report a recent flare.  Lab Results  Component Value Date   LABURIC 7.3 08/13/2020   He has a history of testosterone deficiency and is on testosterone replacement transitioned from gel to injections - doing 200 mg weekly injection starting today. He states that the testosterone helps with his energy, libido, muscle mass. Lab Results  Component Value Date   TESTOSTERONE 783 11/28/2020    Current Medications:  Current Outpatient Medications on File Prior to Visit  Medication Sig   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   allopurinol (ZYLOPRIM) 300 MG tablet Take  1  tablet  Daily  to Prevent Gout   ALPRAZolam (XANAX) 1 MG tablet TAKE 1/Christopher TO 1 TABLET Christopher TO 3 TIMES DAILY ONLY IF NEEDED  FOR ANXIETY ATTACK, LIMIT 5 DAYS PER WEEK TO AVOID  ADDICTION AND DEMENTIA   aspirin EC 81 MG tablet Take 81 mg by mouth daily.   atenolol (TENORMIN) 100 MG tablet TAKE 1 TABLET BY MOUTH  DAILY FOR BLOOD PRESSURE   buPROPion (WELLBUTRIN XL) 300 MG 24 hr tablet TAKE 1 TABLET BY MOUTH IN  THE MORNING FOR MOOD, FOCUS AND CONCENTRATION   Cyanocobalamin (VITAMIN B 12 PO) Take 1 tablet by mouth daily.   cyclobenzaprine (FLEXERIL) 10 MG tablet Take  1/Christopher to 1 tablet  3 x /day   as needed for Muscle Spasm   esomeprazole (NEXIUM) 40 MG capsule TAKE 1 CAPSULE BY MOUTH  TWICE DAILY FOR GERD AND  BARRETT&apos;S ESOPHAGITIS   EUCRISA Christopher % OINT APPLY 1 APPLICATION TOPICALLY TWO TIMES A DAY   fenofibrate 160 MG tablet Take  1 tablet  Daily  for Triglycerides (Blood Fats)   fish oil-omega-3 fatty acids 1000 MG capsule Take 1 g by mouth daily.   Flaxseed, Linseed, (FLAX SEEDS PO) Take by mouth.   magnesium oxide (MAG-OX) 400 MG tablet Take 400 mg by mouth Christopher (two)  times daily.    montelukast (SINGULAIR) 10 MG tablet TAKE 1 TABLET BY MOUTH  DAILY FOR ALLERGIES   Multiple Vitamin (MULTIVITAMIN WITH MINERALS) TABS tablet Take 1 tablet by mouth daily.   potassium chloride (KLOR-CON) 10 MEQ tablet TAKE 1 TABLET BY MOUTH 3  TIMES DAILY   rosuvastatin (CRESTOR) 40 MG tablet TAKE ONE TABLET BY MOUTH DAILY FOR CHOLESTEROL (Patient taking differently: TAKE ONE HALF TABLET BY MOUTH DAILY FOR CHOLESTEROL)   Semaglutide, 1 MG/DOSE, (OZEMPIC, 1 MG/DOSE,) 4 MG/3ML SOPN Inject 1 mg (0.75 ml)     into the Skin      Once Weekly     for Diabetes   tadalafil (CIALIS) 20 MG tablet Take  1/Christopher to 1 tablet  every Christopher to 3 days  as needed for XXXX   terazosin (HYTRIN) Christopher MG capsule TAKE ONE CAPSULE BY MOUTH EVERY NIGHT AT BEDTIME FOR FOR BLOOD PRESSURE AND PROSTATE   testosterone cypionate (DEPO-TESTOSTERONE) 200 MG/ML  injection Inject  1 ml  into Muscle  every week   torsemide (DEMADEX) 20 MG tablet Take  1 tablet  2x /day - Morning /Evening for severe leg swelling   valsartan (DIOVAN) 320 MG tablet Take  1 tablet  Daily for BP   vitamin C (ASCORBIC ACID) 500 MG tablet Take 500 mg by mouth daily.    Vitamin D, Ergocalciferol, (DRISDOL) 1.25 MG (50000 UNIT) CAPS capsule TAKE 1 CAPSULE BY MOUTH  DAILY FOR SEVERE VIT D  DEFICIENCY   Zinc 50 MG TABS Take by mouth daily.   No current facility-administered medications on file prior to visit.     Allergies: No Known Allergies   Medical History:  Past Medical History:  Diagnosis Date   Allergy    Anxiety    Asthma    as a child   Barrett's esophagus    Closed fracture of nasal septum 10/Christopher/2017   GERD (gastroesophageal reflux disease)    Gout    never had a flare up of gout   H/O hiatal hernia    Hyperlipidemia    Hypertension    Prediabetes    Sleep apnea    does not use  cpap   Vitamin D deficiency    Family history- Reviewed and unchanged Social history- Reviewed and unchanged   Review of Systems:  Review of Systems  Constitutional:  Negative for malaise/fatigue and weight loss.  HENT:  Negative for hearing loss and tinnitus.   Eyes:  Negative for blurred vision and double vision.  Respiratory:  Negative for cough, shortness of breath and wheezing.   Cardiovascular:  Positive for leg swelling (bil, chronic, recently worse R>L). Negative for chest pain, palpitations, orthopnea and claudication.  Gastrointestinal:  Negative for abdominal pain, blood in stool, constipation, diarrhea, heartburn, melena, nausea and vomiting.  Genitourinary: Negative.   Musculoskeletal:  Negative for back pain, falls, joint pain and myalgias.  Skin:  Negative for rash.  Neurological:  Negative for dizziness, tingling, sensory change, weakness and headaches.  Endo/Heme/Allergies:  Negative for polydipsia.  Psychiatric/Behavioral: Negative.    All other  systems reviewed and are negative.  Physical Exam: BP 128/88   Pulse 82   Temp 97.7 F (36.5 C)   Wt (!) 350 lb (158.8 kg)   SpO2 94%   BMI 47.47 kg/m  Wt Readings from Last 3 Encounters:  03/15/21 (!) 350 lb (158.8 kg)  02/04/21 (!) 333 lb 3.Christopher oz (151.1 kg)  01/02/21 (!) 351 lb 12.8 oz (159.6 kg)  General Appearance: Well nourished,morbidly obese patient, in no apparent distress. Eyes: PERRLA, EOMs, conjunctiva no swelling or erythema Sinuses: No Frontal/maxillary tenderness ENT/Mouth: Ext aud canals clear, TMs without erythema, bulging. Mask in place; oral exam deferred in light of pandemic and no concerns. Hearing normal.  Neck: Supple, thyroid normal.  Respiratory: Respiratory effort normal, BS equal bilaterally without rales, rhonchi, wheezing or stridor.  Cardio: RRR with no MRGs. Significant nonpitting edema of bilateral ankles and lower legs and calves, neg homan's, non-tender, pulses limited due to edema  Abdomen: Soft, morbidly obese abdomen limiting exam; + BS.  Non tender, no guarding, rebound, palpable hernias or masses. Lymphatics: Non tender without lymphadenopathy.  Musculoskeletal: Full ROM, 5/5 strength, Normal gait Skin: Warm, dry without rashes, lesions, ecchymosis. rippled texture and some brown discoloration at R lower shin (per patient precedes edema for many years) Neuro: Cranial nerves intact. No cerebellar symptoms.  Psych: Awake and oriented X 3, normal affect, Insight and Judgment appropriate.   Christopher Ribas, NP 11:49 AM [facility]

## 2021-03-15 NOTE — Patient Instructions (Addendum)
Goals      Blood Pressure < 130/80     Weight (lb) < 300 lb (136.1 kg)          Please schedule follow up with Dr. Brett Fairy ASAP - call ahead for next month   Recommend reducing animal protein and increase plant protein - beans, nuts/seeds, whole grain, non-starchy veggies    HYPERTENSION INFORMATION  Monitor your blood pressure at home, please keep a record and bring that in with you to your next office visit.   Go to the ER if any CP, SOB, nausea, dizziness, severe HA, changes vision/speech  Testing/Procedures: HOW TO TAKE YOUR BLOOD PRESSURE: Rest 5 minutes before taking your blood pressure. Don't smoke or drink caffeinated beverages for at least 30 minutes before. Take your blood pressure before (not after) you eat. Sit comfortably with your back supported and both feet on the floor (don't cross your legs). Elevate your arm to heart level on a table or a desk. Use the proper sized cuff. It should fit smoothly and snugly around your bare upper arm. There should be enough room to slip a fingertip under the cuff. The bottom edge of the cuff should be 1 inch above the crease of the elbow.  Due to a recent study, SPRINT, we have changed our goal for the systolic or top blood pressure number. Ideally we want your top number at 120.  In the [facility] Trial, 5000 people were randomized to a goal BP of 120 and 5000 people were randomized to a goal BP of less than 140. The patients with the goal BP at 120 had LESS DEMENTIA, LESS HEART ATTACKS, AND LESS STROKES, AS WELL AS OVERALL DECREASED MORTALITY OR DEATH RATE.   There was another study that showed taking your blood pressure medications at night decrease cardiovascular events.  However if you are on a fluid pill, please take this in the morning.   If you are willing, our goal BP is the top number of 120.   Your most recent BP: BP: 128/88   Take your medications faithfully as instructed. Maintain a healthy weight. Get at least  150 minutes of aerobic exercise per week. Minimize salt intake. Minimize alcohol intake  DASH Eating Plan DASH stands for "Dietary Approaches to Stop Hypertension." The DASH eating plan is a healthy eating plan that has been shown to reduce high blood pressure (hypertension). Additional health benefits may include reducing the risk of type 2 diabetes mellitus, heart disease, and stroke. The DASH eating plan may also help with weight loss. WHAT DO I NEED TO KNOW ABOUT THE DASH EATING PLAN? For the DASH eating plan, you will follow these general guidelines: Choose foods with a percent daily value for sodium of less than 5% (as listed on the food label). Use salt-free seasonings or herbs instead of table salt or sea salt. Check with your health care provider or pharmacist before using salt substitutes. Eat lower-sodium products, often labeled as "lower sodium" or "no salt added." Eat fresh foods. Eat more vegetables, fruits, and low-fat dairy products. Choose whole grains. Look for the word "whole" as the first word in the ingredient list. Choose fish and skinless chicken or [place] more often than red meat. Limit fish, poultry, and meat to 6 oz (170 g) each day. Limit sweets, desserts, sugars, and sugary drinks. Choose heart-healthy fats. Limit cheese to 1 oz (28 g) per day. Eat more home-cooked food and less restaurant, buffet, and fast food. Limit fried foods. Lacinda Axon  foods using methods other than frying. Limit canned vegetables. If you do use them, rinse them well to decrease the sodium. When eating at a restaurant, ask that your food be prepared with less salt, or no salt if possible. WHAT FOODS CAN I EAT? Seek help from a dietitian for individual calorie needs. Grains Whole grain or whole wheat bread. Brown rice. Whole grain or whole wheat pasta. Quinoa, bulgur, and whole grain cereals. Low-sodium cereals. Corn or whole wheat flour tortillas. Whole grain cornbread. Whole grain crackers.  Low-sodium crackers. Vegetables Fresh or frozen vegetables (raw, steamed, roasted, or grilled). Low-sodium or reduced-sodium tomato and vegetable juices. Low-sodium or reduced-sodium tomato sauce and paste. Low-sodium or reduced-sodium canned vegetables.  Fruits All fresh, canned (in natural juice), or frozen fruits. Meat and Other Protein Products Ground beef (85% or leaner), grass-fed beef, or beef trimmed of fat. Skinless chicken or [place]. Ground chicken or [place]. Pork trimmed of fat. All fish and seafood. Eggs. Dried beans, peas, or lentils. Unsalted nuts and seeds. Unsalted canned beans. Dairy Low-fat dairy products, such as skim or 1% milk, 2% or reduced-fat cheeses, low-fat ricotta or cottage cheese, or plain low-fat yogurt. Low-sodium or reduced-sodium cheeses. Fats and Oils Tub margarines without trans fats. Light or reduced-fat mayonnaise and salad dressings (reduced sodium). Avocado. Safflower, olive, or canola oils. Natural peanut or almond butter. Other Unsalted popcorn and pretzels. The items listed above may not be a complete list of recommended foods or beverages. Contact your dietitian for more options. WHAT FOODS ARE NOT RECOMMENDED? Grains White bread. White pasta. White rice. Refined cornbread. Bagels and croissants. Crackers that contain trans fat. Vegetables Creamed or fried vegetables. Vegetables in a cheese sauce. Regular canned vegetables. Regular canned tomato sauce and paste. Regular tomato and vegetable juices. Fruits Dried fruits. Canned fruit in light or heavy syrup. Fruit juice. Meat and Other Protein Products Fatty cuts of meat. Ribs, chicken wings, bacon, sausage, bologna, salami, chitterlings, fatback, hot dogs, bratwurst, and packaged luncheon meats. Salted nuts and seeds. Canned beans with salt. Dairy Whole or 2% milk, cream, half-and-half, and cream cheese. Whole-fat or sweetened yogurt. Full-fat cheeses or blue cheese. Nondairy creamers and whipped  toppings. Processed cheese, cheese spreads, or cheese curds. Condiments Onion and garlic salt, seasoned salt, table salt, and sea salt. Canned and packaged gravies. Worcestershire sauce. Tartar sauce. Barbecue sauce. Teriyaki sauce. Soy sauce, including reduced sodium. Steak sauce. Fish sauce. Oyster sauce. Cocktail sauce. Horseradish. Ketchup and mustard. Meat flavorings and tenderizers. Bouillon cubes. Hot sauce. Tabasco sauce. Marinades. Taco seasonings. Relishes. Fats and Oils Butter, stick margarine, lard, shortening, ghee, and bacon fat. Coconut, palm kernel, or palm oils. Regular salad dressings. Other Pickles and olives. Salted popcorn and pretzels. The items listed above may not be a complete list of foods and beverages to avoid. Contact your dietitian for more information. WHERE CAN I FIND MORE INFORMATION? National [employer], [employer], [employer] Institute: travelstabloid.com Document Released: 08/07/2011 Document Revised: 01/02/2014 Document Reviewed: 06/22/2013 [facility] Patient Information 2015 [place], [place]. This information is not intended to replace advice given to you by your health care provider. Make sure you discuss any questions you have with your health care provider.

## 2021-03-16 LAB — CBC WITH DIFFERENTIAL/PLATELET
Absolute Monocytes: 862 cells/uL (ref 200–950)
Basophils Absolute: 23 cells/uL (ref 0–200)
Basophils Relative: 0.3 %
Eosinophils Absolute: 231 cells/uL (ref 15–500)
Eosinophils Relative: 3 %
HCT: 40.3 % (ref 38.5–50.0)
Hemoglobin: 13.7 g/dL (ref 13.2–17.1)
Lymphs Abs: 2287 cells/uL (ref 850–3900)
MCH: 31.2 pg (ref 27.0–33.0)
MCHC: 34 g/dL (ref 32.0–36.0)
MCV: 91.8 fL (ref 80.0–100.0)
MPV: 10.4 fL (ref 7.5–12.5)
Monocytes Relative: 11.2 %
Neutro Abs: 4297 cells/uL (ref 1500–7800)
Neutrophils Relative %: 55.8 %
Platelets: 144 10*3/uL (ref 140–400)
RBC: 4.39 10*6/uL (ref 4.20–5.80)
RDW: 13.5 % (ref 11.0–15.0)
Total Lymphocyte: 29.7 %
WBC: 7.7 10*3/uL (ref 3.8–10.8)

## 2021-03-16 LAB — MAGNESIUM: Magnesium: 1.6 mg/dL (ref 1.5–2.5)

## 2021-03-16 LAB — COMPLETE METABOLIC PANEL WITH GFR
AG Ratio: 1.8 (calc) (ref 1.0–2.5)
ALT: 19 U/L (ref 9–46)
AST: 14 U/L (ref 10–35)
Albumin: 4.3 g/dL (ref 3.6–5.1)
Alkaline phosphatase (APISO): 59 U/L (ref 35–144)
BUN: 13 mg/dL (ref 7–25)
CO2: 30 mmol/L (ref 20–32)
Calcium: 9.7 mg/dL (ref 8.6–10.3)
Chloride: 101 mmol/L (ref 98–110)
Creat: 1.28 mg/dL (ref 0.70–1.35)
Globulin: 2.4 g/dL (calc) (ref 1.9–3.7)
Glucose, Bld: 89 mg/dL (ref 65–99)
Potassium: 3.8 mmol/L (ref 3.5–5.3)
Sodium: 139 mmol/L (ref 135–146)
Total Bilirubin: 0.7 mg/dL (ref 0.2–1.2)
Total Protein: 6.7 g/dL (ref 6.1–8.1)
eGFR: 63 mL/min/{1.73_m2} (ref >=60)

## 2021-03-16 LAB — LIPID PANEL
Cholesterol: 168 mg/dL (ref <200)
HDL: 36 mg/dL — ABNORMAL LOW (ref >=40)
LDL Cholesterol (Calc): 111 mg/dL (calc) — ABNORMAL HIGH
Non-HDL Cholesterol (Calc): 132 mg/dL (calc) — ABNORMAL HIGH (ref <130)
Total CHOL/HDL Ratio: 4.7 (calc) (ref <5.0)
Triglycerides: 100 mg/dL (ref <150)

## 2021-03-16 LAB — TSH: TSH: 1.39 mIU/L (ref 0.40–4.50)

## 2021-03-18 ENCOUNTER — Other Ambulatory Visit: Payer: Self-pay | Admitting: Adult Health

## 2021-03-18 MED ORDER — ROSUVASTATIN CALCIUM 40 MG PO TABS: ORAL_TABLET | ORAL | 3 refills | Status: DC

## 2021-03-22 ENCOUNTER — Ambulatory Visit: Payer: 59

## 2021-03-25 ENCOUNTER — Ambulatory Visit (INDEPENDENT_AMBULATORY_CARE_PROVIDER_SITE_OTHER): Payer: BC Managed Care – PPO

## 2021-03-25 ENCOUNTER — Other Ambulatory Visit: Payer: Self-pay

## 2021-03-25 VITALS — BP 120/80 | HR 82 | Temp 97.3°F | Wt 332.4 lb

## 2021-03-25 DIAGNOSIS — E349 Endocrine disorder, unspecified: Secondary | ICD-10-CM | POA: Diagnosis not present

## 2021-03-25 MED ORDER — TESTOSTERONE CYPIONATE 200 MG/ML IM SOLN
200.0000 mg | Freq: Once | INTRAMUSCULAR | Status: AC
  Administered 2021-03-25: 200 mg via INTRAMUSCULAR

## 2021-03-25 NOTE — Progress Notes (Signed)
Patient here for a Testosterone injection 200 mg/ml 1 ml IM injection in Right upper outer quadrant. Patient tolerated well and will return in 1 week for his next injection.

## 2021-04-02 ENCOUNTER — Other Ambulatory Visit: Payer: Self-pay | Admitting: Internal Medicine

## 2021-04-08 ENCOUNTER — Other Ambulatory Visit: Payer: Self-pay | Admitting: Adult Health

## 2021-04-08 DIAGNOSIS — E8881 Metabolic syndrome: Secondary | ICD-10-CM

## 2021-04-08 DIAGNOSIS — R7301 Impaired fasting glucose: Secondary | ICD-10-CM

## 2021-04-08 MED ORDER — MOUNJARO 5 MG/0.5ML ~~LOC~~ SOAJ: 5.0000 mg | SUBCUTANEOUS | 0 refills | Status: DC

## 2021-04-22 ENCOUNTER — Ambulatory Visit (INDEPENDENT_AMBULATORY_CARE_PROVIDER_SITE_OTHER): Payer: BC Managed Care – PPO

## 2021-04-22 ENCOUNTER — Other Ambulatory Visit: Payer: Self-pay

## 2021-04-22 VITALS — BP 126/82 | HR 85 | Temp 97.0°F | Resp 16 | Ht 72.0 in | Wt 344.4 lb

## 2021-04-22 DIAGNOSIS — E349 Endocrine disorder, unspecified: Secondary | ICD-10-CM

## 2021-04-22 MED ORDER — TESTOSTERONE CYPIONATE 200 MG/ML IM SOLN
100.0000 mg | Freq: Once | INTRAMUSCULAR | Status: AC
  Administered 2021-04-22: 100 mg via INTRAMUSCULAR

## 2021-04-22 NOTE — Progress Notes (Signed)
Patient here for a Testosterone injection 200 mg/ml 1 ml IM injection in Left upper outer quadrant. Patient tolerated well and will return in 1 week for his next injection.

## 2021-04-26 ENCOUNTER — Other Ambulatory Visit: Payer: Self-pay | Admitting: Internal Medicine

## 2021-04-26 ENCOUNTER — Other Ambulatory Visit: Payer: Self-pay | Admitting: Adult Health Nurse Practitioner

## 2021-04-26 DIAGNOSIS — I10 Essential (primary) hypertension: Secondary | ICD-10-CM

## 2021-04-29 ENCOUNTER — Ambulatory Visit: Payer: BC Managed Care – PPO

## 2021-04-30 ENCOUNTER — Ambulatory Visit (INDEPENDENT_AMBULATORY_CARE_PROVIDER_SITE_OTHER): Payer: BC Managed Care – PPO

## 2021-04-30 ENCOUNTER — Other Ambulatory Visit: Payer: Self-pay

## 2021-04-30 VITALS — BP 138/90 | HR 81 | Temp 97.9°F | Wt 350.0 lb

## 2021-04-30 DIAGNOSIS — E349 Endocrine disorder, unspecified: Secondary | ICD-10-CM

## 2021-04-30 MED ORDER — TESTOSTERONE CYPIONATE 200 MG/ML IM SOLN
100.0000 mg | Freq: Once | INTRAMUSCULAR | Status: AC
  Administered 2021-04-30: 100 mg via INTRAMUSCULAR

## 2021-04-30 NOTE — Progress Notes (Signed)
Patient here for a Testosterone injection 200 mg/ml 1 ml IM injection in RIGHT upper outer quadrant. Patient tolerated well and will return in 1 week for his next injection.

## 2021-05-03 ENCOUNTER — Other Ambulatory Visit: Payer: Self-pay | Admitting: Adult Health

## 2021-05-03 MED ORDER — MOUNJARO 7.5 MG/0.5ML ~~LOC~~ SOAJ: 7.5000 mg | SUBCUTANEOUS | 0 refills | Status: DC

## 2021-05-22 ENCOUNTER — Other Ambulatory Visit: Payer: Self-pay | Admitting: Adult Health

## 2021-05-27 ENCOUNTER — Other Ambulatory Visit: Payer: Self-pay | Admitting: Internal Medicine

## 2021-05-27 MED ORDER — DEXAMETHASONE 4 MG PO TABS: ORAL_TABLET | ORAL | 0 refills | Status: DC

## 2021-06-05 ENCOUNTER — Other Ambulatory Visit (HOSPITAL_COMMUNITY): Payer: Self-pay

## 2021-06-05 ENCOUNTER — Other Ambulatory Visit (HOSPITAL_BASED_OUTPATIENT_CLINIC_OR_DEPARTMENT_OTHER): Payer: Self-pay

## 2021-06-05 ENCOUNTER — Other Ambulatory Visit: Payer: Self-pay

## 2021-06-05 ENCOUNTER — Ambulatory Visit (INDEPENDENT_AMBULATORY_CARE_PROVIDER_SITE_OTHER): Payer: BC Managed Care – PPO

## 2021-06-05 VITALS — BP 122/82 | HR 72 | Temp 97.9°F | Wt 327.0 lb

## 2021-06-05 DIAGNOSIS — E349 Endocrine disorder, unspecified: Secondary | ICD-10-CM | POA: Diagnosis not present

## 2021-06-05 DIAGNOSIS — R7301 Impaired fasting glucose: Secondary | ICD-10-CM

## 2021-06-05 MED ORDER — MOUNJARO 7.5 MG/0.5ML ~~LOC~~ SOAJ
7.5000 mg | SUBCUTANEOUS | 0 refills | Status: DC
Start: 2021-06-05 — End: 2021-06-25
  Filled 2021-06-05 (×2): qty 2, 28d supply, fill #0

## 2021-06-05 MED ORDER — TESTOSTERONE CYPIONATE 200 MG/ML IM SOLN
200.0000 mg | Freq: Once | INTRAMUSCULAR | Status: AC
  Administered 2021-06-05: 200 mg via INTRAMUSCULAR

## 2021-06-05 NOTE — Progress Notes (Signed)
Patient here for a Testosterone injection 200 mg/ml 1 ml IM injection in LEFT upper outer quadrant. Patient tolerated well and will return in 1 week for his next injection.

## 2021-06-12 ENCOUNTER — Ambulatory Visit: Payer: 59 | Admitting: Internal Medicine

## 2021-06-12 ENCOUNTER — Encounter: Payer: Self-pay | Admitting: Internal Medicine

## 2021-06-12 ENCOUNTER — Other Ambulatory Visit: Payer: Self-pay

## 2021-06-12 DIAGNOSIS — Z79899 Other long term (current) drug therapy: Secondary | ICD-10-CM

## 2021-06-12 DIAGNOSIS — R7301 Impaired fasting glucose: Secondary | ICD-10-CM

## 2021-06-12 DIAGNOSIS — R450 Nervousness: Secondary | ICD-10-CM

## 2021-06-12 DIAGNOSIS — I10 Essential (primary) hypertension: Secondary | ICD-10-CM

## 2021-06-12 DIAGNOSIS — Z6841 Body Mass Index (BMI) 40.0 and over, adult: Secondary | ICD-10-CM

## 2021-06-12 DIAGNOSIS — M109 Gout, unspecified: Secondary | ICD-10-CM

## 2021-06-12 DIAGNOSIS — I708 Atherosclerosis of other arteries: Secondary | ICD-10-CM

## 2021-06-12 DIAGNOSIS — E7439 Other disorders of intestinal carbohydrate absorption: Secondary | ICD-10-CM

## 2021-06-12 DIAGNOSIS — E782 Mixed hyperlipidemia: Secondary | ICD-10-CM | POA: Diagnosis not present

## 2021-06-12 DIAGNOSIS — I700 Atherosclerosis of aorta: Secondary | ICD-10-CM

## 2021-06-12 DIAGNOSIS — E349 Endocrine disorder, unspecified: Secondary | ICD-10-CM

## 2021-06-12 DIAGNOSIS — E559 Vitamin D deficiency, unspecified: Secondary | ICD-10-CM | POA: Diagnosis not present

## 2021-06-12 DIAGNOSIS — Z23 Encounter for immunization: Secondary | ICD-10-CM | POA: Diagnosis not present

## 2021-06-12 MED ORDER — ALPRAZOLAM 1 MG PO TABS: ORAL_TABLET | ORAL | 0 refills | Status: DC

## 2021-06-12 NOTE — Progress Notes (Signed)
Future Appointments  Date Time Provider [place]  06/12/2021  9:30 AM Unk Pinto, MD [facility] None  12/04/2021  9:00 AM Unk Pinto, MD [facility] None    History of Present Illness:       This very nice 65 y.o. [name] presents for 3 month follow up with HTN, HLD, Pre-Diabetes and Vitamin D Deficiency. Abd CT scan in 2019 showed Aorto-Iliac Atherosclerosis.       Patient is treated for HTN & BP has been controlled at home. Today's  . Patient has had no complaints of any cardiac type chest pain, palpitations, dyspnea / orthopnea / PND, dizziness, claudication, or dependent edema.       Hyperlipidemia is controlled with diet & meds. Patient denies myalgias or other med SE's. Last Lipids were not at goal:  Lab Results  Component Value Date   CHOL 168 03/15/2021   HDL 36 (L) 03/15/2021   LDLCALC 111 (H) 03/15/2021   TRIG 100 03/15/2021   CHOLHDL 4.7 03/15/2021     Also, the patient has history of PreDiabetes and has had no symptoms of reactive hypoglycemia, diabetic polys, paresthesias or visual blurring.  Last A1c was not at goal:  Lab Results  Component Value Date   HGBA1C 5.8 (H) 11/28/2020                                                         Further, the patient also has history of Vitamin D Deficiency and supplements vitamin D without any suspected side-effects. Last vitamin D was at goal:  Lab Results  Component Value Date   VD25OH 92 11/28/2020     Current Outpatient Medications on File Prior to Visit  Medication Sig   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   allopurinol (ZYLOPRIM) 300 MG tablet Take  1 tablet  Daily  to Prevent Gout   ALPRAZolam (XANAX) 1 MG tablet TAKE 1/2 TO 1 TABLET 2 TO 3 TIMES DAILY ONLY IF NEEDED  FOR ANXIETY ATTACK, LIMIT 5 DAYS PER WEEK TO AVOID  ADDICTION AND DEMENTIA   aspirin EC 81 MG tablet Take 81 mg by mouth daily.   atenolol (TENORMIN) 100 MG tablet TAKE 1 TABLET BY MOUTH   DAILY FOR BLOOD PRESSURE   buPROPion (WELLBUTRIN XL) 300 MG 24 hr tablet TAKE 1 TABLET BY MOUTH IN  THE MORNING FOR MOOD, FOCUS AND CONCENTRATION   Cyanocobalamin (VITAMIN B 12 PO) Take 1 tablet by mouth daily.   cyclobenzaprine (FLEXERIL) 10 MG tablet Take  1/2 to 1 tablet  3 x /day   as needed for Muscle Spasm   dexamethasone (DECADRON) 4 MG tablet Take 1 tab 3 x /day for 2 days,      then 2 x /day for 2  Days,     then 1 tab daily   esomeprazole (NEXIUM) 40 MG capsule TAKE 1 CAPSULE BY MOUTH  TWICE DAILY FOR GERD AND  BARRETT&apos;S ESOPHAGITIS   EUCRISA 2 % OINT APPLY 1 APPLICATION TOPICALLY TWO TIMES A DAY   fenofibrate 160 MG tablet Take  1 tablet  Daily  for Triglycerides (Blood Fats)   fish oil-omega-3 fatty acids 1000 MG capsule Take 1 g by mouth daily.   Flaxseed, Linseed, (FLAX SEEDS PO) Take by mouth.  magnesium oxide (MAG-OX) 400 MG tablet Take 400 mg by mouth 2 (two) times daily.    montelukast (SINGULAIR) 10 MG tablet TAKE 1 TABLET BY MOUTH  DAILY FOR ALLERGIES   Multiple Vitamin (MULTIVITAMIN WITH MINERALS) TABS tablet Take 1 tablet by mouth daily.   potassium chloride (KLOR-CON) 10 MEQ tablet TAKE 1 TABLET BY MOUTH 3  TIMES DAILY   rosuvastatin (CRESTOR) 40 MG tablet TAKE ONE TABLET BY MOUTH 5 DAYS A WEEK FOR CHOLESTEROL   tadalafil (CIALIS) 20 MG tablet Take  1/2 to 1 tablet  every 2 to 3 days  as needed for XXXX   terazosin (HYTRIN) 2 MG capsule TAKE 1 CAPSULE BY MOUTH AT  BEDTIME FOR BP &amp; PROSTATE   testosterone cypionate (DEPO-TESTOSTERONE) 200 MG/ML injection Inject  1 ml  into Muscle  every week   tirzepatide (MOUNJARO) 7.5 MG/0.5ML Pen Inject 7.5 mg into the skin once a week.   torsemide (DEMADEX) 20 MG tablet Take  1 tablet  2x /day - Morning /Evening for severe leg swelling   valsartan (DIOVAN) 320 MG tablet Take  1 tablet  Daily for BP   vitamin C (ASCORBIC ACID) 500 MG tablet Take 500 mg by mouth daily.    Vitamin D, Ergocalciferol, (DRISDOL) 1.25 MG (50000  UNIT) CAPS capsule TAKE 1 CAPSULE BY MOUTH  DAILY FOR SEVERE VIT D  DEFICIENCY   Zinc 50 MG TABS Take by mouth daily.   No current facility-administered medications on file prior to visit.     No Known Allergies   PMHx:   Past Medical History:  Diagnosis Date   Allergy    Anxiety    Asthma    as a child   Barrett's esophagus    Closed fracture of nasal septum 06/02/2016   GERD (gastroesophageal reflux disease)    Gout    never had a flare up of gout   H/O hiatal hernia    Hyperlipidemia    Hypertension    Prediabetes    Sleep apnea    does not use  cpap   Vitamin D deficiency      Immunization History  Administered Date(s) Administered   Influenza Inj Mdck Quad  07/02/2017, 06/15/2018   Influenza Split 06/28/2014, 06/28/2015   Influenza,inj,Quad  06/19/2019   Influenza,inj,quad 08/11/2016   Influenza 09/01/2012   PFIZER SARS-COV-2 Vacc 10/17/2019, 11/07/2019, 09/02/2020   PPD Test 11/01/2018, 11/21/2019, 11/28/2020   Pneumococcal-23 09/01/1998   Tdap 08/01/2013   Zoster Recombinat (Shingrix) 06/19/2019, 08/23/2019     Past Surgical History:  Procedure Laterality Date   CLOSED REDUCTION NASAL FRACTURE N/A 06/10/2016   Procedure: CLOSED REDUCTION NASAL FRACTURE FOREHEAD SUTURE REMOVAL;  Surgeon: [name], DO;  Location: [facility];  Service: Plastics;  Laterality: N/A;   COLONOSCOPY     HEMORRHOID SURGERY  1980   Left knee Arthroscopy     Right knee arthroscopy     UPPER GASTROINTESTINAL ENDOSCOPY     last egd 12-7*2012    FHx:    Reviewed / unchanged  SHx:    Reviewed / unchanged   Systems Review:  Constitutional: Denies fever, chills, wt changes, headaches, insomnia, fatigue, night sweats, change in appetite. Eyes: Denies redness, blurred vision, diplopia, discharge, itchy, watery eyes.  ENT: Denies discharge, congestion, post nasal drip, epistaxis, sore throat, earache, hearing loss, dental pain, tinnitus, vertigo, sinus  pain, snoring.  CV: Denies chest pain, palpitations, irregular heartbeat, syncope, dyspnea, diaphoresis, orthopnea, PND, claudication or edema. Respiratory:  denies cough, dyspnea, DOE, pleurisy, hoarseness, laryngitis, wheezing.  Gastrointestinal: Denies dysphagia, odynophagia, heartburn, reflux, water brash, abdominal pain or cramps, nausea, vomiting, bloating, diarrhea, constipation, hematemesis, melena, hematochezia  or hemorrhoids. Genitourinary: Denies dysuria, frequency, urgency, nocturia, hesitancy, discharge, hematuria or flank pain. Musculoskeletal: Denies arthralgias, myalgias, stiffness, jt. swelling, pain, limping or strain/sprain.  Skin: Denies pruritus, rash, hives, warts, acne, eczema or change in skin lesion(s). Neuro: No weakness, tremor, incoordination, spasms, paresthesia or pain. Psychiatric: Denies confusion, memory loss or sensory loss. Endo: Denies change in weight, skin or hair change.  Heme/Lymph: No excessive bleeding, bruising or enlarged lymph nodes.  Physical Exam  There were no vitals taken for this visit.  Appears  well nourished, well groomed  and in no distress.  Eyes: PERRLA, EOMs, conjunctiva no swelling or erythema. Sinuses: No frontal/maxillary tenderness ENT/Mouth: EAC's clear, TM's nl w/o erythema, bulging. Nares clear w/o erythema, swelling, exudates. Oropharynx clear without erythema or exudates. Oral hygiene is good. Tongue normal, non obstructing. Hearing intact.  Neck: Supple. Thyroid not palpable. Car 2+/2+ without bruits, nodes or JVD. Chest: Respirations nl with BS clear & equal w/o rales, rhonchi, wheezing or stridor.  Cor: Heart sounds normal w/ regular rate and rhythm without sig. murmurs, gallops, clicks or rubs. Peripheral pulses normal and equal  without edema.  Abdomen: Soft & bowel sounds normal. Non-tender w/o guarding, rebound, hernias, masses or organomegaly.  Lymphatics: Unremarkable.  Musculoskeletal: Full ROM all peripheral  extremities, joint stability, 5/5 strength and normal gait.  Skin: Warm, dry without exposed rashes, lesions or ecchymosis apparent.  Neuro: Cranial nerves intact, reflexes equal bilaterally. Sensory-motor testing grossly intact. Tendon reflexes grossly intact.  Pysch: Alert & oriented x 3.  Insight and judgement nl & appropriate. No ideations.  Assessment and Plan:  - Continue medication, monitor blood pressure at home.  - Continue DASH diet.  Reminder to go to the ER if any CP,  SOB, nausea, dizziness, severe HA, changes vision/speech.  - Continue diet/meds, exercise,& lifestyle modifications.  - Continue monitor periodic cholesterol/liver & renal functions    - Continue diet, exercise  - Lifestyle modifications.  - Monitor appropriate labs. - Continue supplementation.        Discussed  regular exercise, BP monitoring, weight control to achieve/maintain BMI less than 25 and discussed med and SE's. Recommended labs to assess and monitor clinical status with further disposition pending results of labs.  I discussed the assessment and treatment plan with the patient. The patient was provided an opportunity to ask questions and all were answered. The patient agreed with the plan and demonstrated an understanding of the instructions.  I provided over 30 minutes of exam, counseling, chart review and  complex critical decision making.        The patient was advised to call back or seek an in-person evaluation if the symptoms worsen or if the condition fails to improve as anticipated.   Kirtland Bouchard, MD

## 2021-06-12 NOTE — Patient Instructions (Signed)

## 2021-06-13 LAB — LIPID PANEL
Cholesterol: 167 mg/dL (ref <200)
HDL: 38 mg/dL — ABNORMAL LOW (ref >=40)
LDL Cholesterol (Calc): 111 mg/dL (calc) — ABNORMAL HIGH
Non-HDL Cholesterol (Calc): 129 mg/dL (calc) (ref <130)
Total CHOL/HDL Ratio: 4.4 (calc) (ref <5.0)
Triglycerides: 85 mg/dL (ref <150)

## 2021-06-13 LAB — INSULIN, RANDOM: Insulin: 13.9 u[IU]/mL

## 2021-06-13 LAB — COMPLETE METABOLIC PANEL WITH GFR
AG Ratio: 1.6 (calc) (ref 1.0–2.5)
ALT: 19 U/L (ref 9–46)
AST: 18 U/L (ref 10–35)
Albumin: 4.2 g/dL (ref 3.6–5.1)
Alkaline phosphatase (APISO): 42 U/L (ref 35–144)
BUN: 16 mg/dL (ref 7–25)
CO2: 28 mmol/L (ref 20–32)
Calcium: 9.3 mg/dL (ref 8.6–10.3)
Chloride: 102 mmol/L (ref 98–110)
Creat: 1.34 mg/dL (ref 0.70–1.35)
Globulin: 2.6 g/dL (calc) (ref 1.9–3.7)
Glucose, Bld: 91 mg/dL (ref 65–99)
Potassium: 4.1 mmol/L (ref 3.5–5.3)
Sodium: 140 mmol/L (ref 135–146)
Total Bilirubin: 0.6 mg/dL (ref 0.2–1.2)
Total Protein: 6.8 g/dL (ref 6.1–8.1)
eGFR: 60 mL/min/{1.73_m2} (ref >=60)

## 2021-06-13 LAB — CBC WITH DIFFERENTIAL/PLATELET
Absolute Monocytes: 1006 cells/uL — ABNORMAL HIGH (ref 200–950)
Basophils Absolute: 26 cells/uL (ref 0–200)
Basophils Relative: 0.3 %
Eosinophils Absolute: 146 cells/uL (ref 15–500)
Eosinophils Relative: 1.7 %
HCT: 44.4 % (ref 38.5–50.0)
Hemoglobin: 14.9 g/dL (ref 13.2–17.1)
Lymphs Abs: 2245 cells/uL (ref 850–3900)
MCH: 30.8 pg (ref 27.0–33.0)
MCHC: 33.6 g/dL (ref 32.0–36.0)
MCV: 91.9 fL (ref 80.0–100.0)
MPV: 11.1 fL (ref 7.5–12.5)
Monocytes Relative: 11.7 %
Neutro Abs: 5177 cells/uL (ref 1500–7800)
Neutrophils Relative %: 60.2 %
Platelets: 140 10*3/uL (ref 140–400)
RBC: 4.83 10*6/uL (ref 4.20–5.80)
RDW: 12.4 % (ref 11.0–15.0)
Total Lymphocyte: 26.1 %
WBC: 8.6 10*3/uL (ref 3.8–10.8)

## 2021-06-13 LAB — HEMOGLOBIN A1C
Hgb A1c MFr Bld: 5.5 % of total Hgb (ref <5.7)
Mean Plasma Glucose: 111 mg/dL
eAG (mmol/L): 6.2 mmol/L

## 2021-06-13 LAB — VITAMIN D 25 HYDROXY (VIT D DEFICIENCY, FRACTURES): Vit D, 25-Hydroxy: 93 ng/mL (ref 30–100)

## 2021-06-13 LAB — MAGNESIUM: Magnesium: 2 mg/dL (ref 1.5–2.5)

## 2021-06-13 LAB — TSH: TSH: 1.81 mIU/L (ref 0.40–4.50)

## 2021-06-13 LAB — URIC ACID: Uric Acid, Serum: 7.5 mg/dL (ref 4.0–8.0)

## 2021-06-13 LAB — TESTOSTERONE: Testosterone: 801 ng/dL (ref 250–827)

## 2021-06-13 NOTE — Progress Notes (Signed)
============================================================ -   Test results slightly outside the reference range are not unusual. If there is anything important, I will review this with you,  otherwise it is considered normal test values.  If you have further questions,  please do not hesitate to contact me at the office or via My Chart.  ============================================================ ============================================================  -  Total Chol = 167 - Great ! , But   - Bad /Dangerous LDL Chol = 111 =- too high           (  Ideal or Goal is less than 70  !  )   - PLEASE start back taking your Rosuvastatin (Crestor) 40 mg   - at 1/2  tablet  3 x /week   Sun - Tues   & Thu ============================================================ ============================================================  -  A1c = 5.5% - Wonderful - back down in the Normal non- Diabetic Range ! ============================================================ ============================================================  -  Vitamin D = 93 - Excellent - Please keep dose same ============================================================ ============================================================  -  Testosterone - Normal range ============================================================ ============================================================  -  All Else - CBC - Kidneys - Electrolytes - Liver - Magnesium & Thyroid    - all  Normal / OK ============================================================ ============================================================

## 2021-06-14 ENCOUNTER — Other Ambulatory Visit: Payer: Self-pay | Admitting: Internal Medicine

## 2021-06-14 DIAGNOSIS — F3341 Major depressive disorder, recurrent, in partial remission: Secondary | ICD-10-CM

## 2021-06-14 DIAGNOSIS — F419 Anxiety disorder, unspecified: Secondary | ICD-10-CM

## 2021-06-14 MED ORDER — BUPROPION HCL ER (XL) 300 MG PO TB24: ORAL_TABLET | ORAL | 3 refills | Status: DC

## 2021-06-15 ENCOUNTER — Other Ambulatory Visit: Payer: Self-pay | Admitting: Internal Medicine

## 2021-06-17 ENCOUNTER — Other Ambulatory Visit: Payer: Self-pay | Admitting: Internal Medicine

## 2021-06-17 DIAGNOSIS — I872 Venous insufficiency (chronic) (peripheral): Secondary | ICD-10-CM

## 2021-06-19 ENCOUNTER — Ambulatory Visit (INDEPENDENT_AMBULATORY_CARE_PROVIDER_SITE_OTHER): Payer: BC Managed Care – PPO

## 2021-06-19 ENCOUNTER — Other Ambulatory Visit: Payer: Self-pay

## 2021-06-19 VITALS — BP 120/84 | HR 60 | Temp 97.7°F | Wt 340.4 lb

## 2021-06-19 DIAGNOSIS — E349 Endocrine disorder, unspecified: Secondary | ICD-10-CM

## 2021-06-19 MED ORDER — TESTOSTERONE CYPIONATE 200 MG/ML IM SOLN
200.0000 mg | Freq: Once | INTRAMUSCULAR | Status: AC
  Administered 2021-06-19: 200 mg via INTRAMUSCULAR

## 2021-06-19 NOTE — Progress Notes (Signed)
Patient here for a Testosterone injection 200 mg/ml 1 ml IM injection in RIGHT upper outer quadrant. Patient tolerated well and will return in 1 week for his next injection.

## 2021-06-25 ENCOUNTER — Other Ambulatory Visit: Payer: Self-pay | Admitting: Nurse Practitioner

## 2021-06-25 ENCOUNTER — Other Ambulatory Visit (HOSPITAL_COMMUNITY): Payer: Self-pay

## 2021-06-25 DIAGNOSIS — R7301 Impaired fasting glucose: Secondary | ICD-10-CM

## 2021-06-25 MED ORDER — MOUNJARO 10 MG/0.5ML ~~LOC~~ SOAJ
10.0000 mg | SUBCUTANEOUS | 3 refills | Status: DC
  Filled 2021-06-25: qty 2, 28d supply, fill #0
  Filled 2021-07-17: qty 2, 28d supply, fill #1
  Filled 2021-08-19: qty 2, 28d supply, fill #2
  Filled 2021-09-09: qty 2, 28d supply, fill #3

## 2021-06-25 MED ORDER — MOUNJARO 10 MG/0.5ML ~~LOC~~ SOAJ
10.0000 mg | SUBCUTANEOUS | 3 refills | Status: DC
  Filled 2021-06-25: qty 2, 28d supply, fill #0

## 2021-06-26 ENCOUNTER — Other Ambulatory Visit (HOSPITAL_COMMUNITY): Payer: Self-pay

## 2021-06-27 ENCOUNTER — Other Ambulatory Visit (HOSPITAL_COMMUNITY): Payer: Self-pay

## 2021-07-03 ENCOUNTER — Other Ambulatory Visit: Payer: Self-pay

## 2021-07-03 ENCOUNTER — Ambulatory Visit (INDEPENDENT_AMBULATORY_CARE_PROVIDER_SITE_OTHER): Payer: BC Managed Care – PPO

## 2021-07-03 VITALS — BP 122/84 | HR 74 | Temp 97.7°F | Wt 332.0 lb

## 2021-07-03 DIAGNOSIS — E349 Endocrine disorder, unspecified: Secondary | ICD-10-CM

## 2021-07-03 MED ORDER — TESTOSTERONE CYPIONATE 100 MG/ML IM SOLN: 100.0000 mg | Freq: Once | INTRAMUSCULAR | Status: DC

## 2021-07-03 MED ORDER — TESTOSTERONE CYPIONATE 200 MG/ML IM SOLN
200.0000 mg | Freq: Once | INTRAMUSCULAR | Status: AC
  Administered 2021-07-03: 100 mg via INTRAMUSCULAR

## 2021-07-03 NOTE — Progress Notes (Signed)
Patient here for a Testosterone injection 200 mg/ml 1 ml IM injection in LEFT upper outer quadrant. Patient tolerated well and will return in 1 week for his next injection.

## 2021-07-05 ENCOUNTER — Other Ambulatory Visit: Payer: Self-pay | Admitting: Internal Medicine

## 2021-07-05 DIAGNOSIS — R450 Nervousness: Secondary | ICD-10-CM

## 2021-07-05 MED ORDER — ALPRAZOLAM 1 MG PO TABS: ORAL_TABLET | ORAL | 0 refills | Status: DC

## 2021-07-05 NOTE — Telephone Encounter (Signed)
Can you make sure his immunizations are entered

## 2021-07-10 ENCOUNTER — Ambulatory Visit (INDEPENDENT_AMBULATORY_CARE_PROVIDER_SITE_OTHER): Payer: BC Managed Care – PPO

## 2021-07-10 ENCOUNTER — Other Ambulatory Visit: Payer: Self-pay

## 2021-07-10 VITALS — BP 114/74 | HR 77 | Temp 97.5°F

## 2021-07-10 DIAGNOSIS — E349 Endocrine disorder, unspecified: Secondary | ICD-10-CM | POA: Diagnosis not present

## 2021-07-10 MED ORDER — TESTOSTERONE CYPIONATE 200 MG/ML IM SOLN
200.0000 mg | Freq: Once | INTRAMUSCULAR | Status: AC
  Administered 2021-07-10: 100 mg via INTRAMUSCULAR

## 2021-07-10 NOTE — Progress Notes (Signed)
Patient here for a Testosterone injection 200 mg/ml 1 ml IM injection in RIGHT upper outer quadrant. Patient tolerated well and will return in 1 week for his next injection

## 2021-07-17 ENCOUNTER — Other Ambulatory Visit (HOSPITAL_COMMUNITY): Payer: Self-pay

## 2021-07-18 ENCOUNTER — Other Ambulatory Visit (HOSPITAL_COMMUNITY): Payer: Self-pay

## 2021-07-19 ENCOUNTER — Other Ambulatory Visit (HOSPITAL_COMMUNITY): Payer: Self-pay

## 2021-08-01 ENCOUNTER — Other Ambulatory Visit: Payer: Self-pay

## 2021-08-01 ENCOUNTER — Ambulatory Visit (INDEPENDENT_AMBULATORY_CARE_PROVIDER_SITE_OTHER): Payer: BC Managed Care – PPO

## 2021-08-01 VITALS — BP 128/84 | HR 67 | Temp 97.5°F | Wt 324.8 lb

## 2021-08-01 DIAGNOSIS — E349 Endocrine disorder, unspecified: Secondary | ICD-10-CM

## 2021-08-01 MED ORDER — TESTOSTERONE CYPIONATE 200 MG/ML IM SOLN
200.0000 mg | Freq: Once | INTRAMUSCULAR | Status: AC
  Administered 2021-08-01: 200 mg via INTRAMUSCULAR

## 2021-08-01 NOTE — Progress Notes (Signed)
Patient here for a Testosterone injection 200 mg/ml 1 ml IM injection in LEFT upper outer quadrant. Patient tolerated well and will return in 1 week for his next injection

## 2021-08-09 ENCOUNTER — Other Ambulatory Visit: Payer: Self-pay

## 2021-08-09 ENCOUNTER — Ambulatory Visit (INDEPENDENT_AMBULATORY_CARE_PROVIDER_SITE_OTHER): Payer: BC Managed Care – PPO

## 2021-08-09 VITALS — BP 114/80 | HR 69 | Temp 97.9°F | Wt 334.0 lb

## 2021-08-09 DIAGNOSIS — E349 Endocrine disorder, unspecified: Secondary | ICD-10-CM

## 2021-08-09 MED ORDER — TESTOSTERONE CYPIONATE 200 MG/ML IM SOLN
200.0000 mg | Freq: Once | INTRAMUSCULAR | Status: AC
  Administered 2021-08-09: 200 mg via INTRAMUSCULAR

## 2021-08-09 NOTE — Progress Notes (Signed)
Patient here for a Testosterone injection 200 mg/ml 1 ml IM injection in RIGHT upper outer quadrant. Patient tolerated well and will return in 1 week for his next injection

## 2021-08-14 ENCOUNTER — Other Ambulatory Visit: Payer: Self-pay | Admitting: Internal Medicine

## 2021-08-14 DIAGNOSIS — I872 Venous insufficiency (chronic) (peripheral): Secondary | ICD-10-CM

## 2021-08-14 MED ORDER — TORSEMIDE 20 MG PO TABS: ORAL_TABLET | ORAL | 3 refills | Status: DC

## 2021-08-19 ENCOUNTER — Other Ambulatory Visit (HOSPITAL_COMMUNITY): Payer: Self-pay

## 2021-08-20 ENCOUNTER — Other Ambulatory Visit (HOSPITAL_COMMUNITY): Payer: Self-pay

## 2021-08-21 ENCOUNTER — Ambulatory Visit (INDEPENDENT_AMBULATORY_CARE_PROVIDER_SITE_OTHER): Payer: BC Managed Care – PPO

## 2021-08-21 ENCOUNTER — Other Ambulatory Visit: Payer: Self-pay

## 2021-08-21 VITALS — BP 118/80 | HR 74 | Temp 97.7°F | Wt 321.4 lb

## 2021-08-21 DIAGNOSIS — E349 Endocrine disorder, unspecified: Secondary | ICD-10-CM | POA: Diagnosis not present

## 2021-08-21 MED ORDER — TESTOSTERONE CYPIONATE 200 MG/ML IM SOLN
200.0000 mg | Freq: Once | INTRAMUSCULAR | Status: AC
  Administered 2021-08-21: 14:00:00 200 mg via INTRAMUSCULAR

## 2021-08-21 NOTE — Progress Notes (Signed)
Patient here for a Testosterone injection 200 mg/ml 1 ml IM injection in LEFT upper outer quadrant. Patient tolerated well and will return in 1 week for his next injection

## 2021-08-28 ENCOUNTER — Ambulatory Visit (INDEPENDENT_AMBULATORY_CARE_PROVIDER_SITE_OTHER): Payer: BC Managed Care – PPO

## 2021-08-28 ENCOUNTER — Other Ambulatory Visit: Payer: Self-pay

## 2021-08-28 VITALS — BP 130/78 | HR 83 | Temp 97.9°F | Resp 16 | Ht 72.0 in | Wt 320.0 lb

## 2021-08-28 DIAGNOSIS — E349 Endocrine disorder, unspecified: Secondary | ICD-10-CM

## 2021-08-28 MED ORDER — TESTOSTERONE CYPIONATE 200 MG/ML IM SOLN
200.0000 mg | Freq: Once | INTRAMUSCULAR | Status: AC
  Administered 2021-08-28: 12:00:00 200 mg via INTRAMUSCULAR

## 2021-08-28 NOTE — Progress Notes (Signed)
Patient here for a Testosterone injection 200 mg/ml 1 ml IM injection in RIGHT upper outer quadrant. Patient tolerated well and will return in 1 week for his next injection

## 2021-09-05 ENCOUNTER — Ambulatory Visit (INDEPENDENT_AMBULATORY_CARE_PROVIDER_SITE_OTHER): Payer: BC Managed Care – PPO

## 2021-09-05 ENCOUNTER — Other Ambulatory Visit: Payer: Self-pay

## 2021-09-05 VITALS — BP 118/84 | HR 86 | Temp 97.7°F | Wt 316.2 lb

## 2021-09-05 DIAGNOSIS — E349 Endocrine disorder, unspecified: Secondary | ICD-10-CM

## 2021-09-05 MED ORDER — TESTOSTERONE CYPIONATE 200 MG/ML IM SOLN
200.0000 mg | Freq: Once | INTRAMUSCULAR | Status: AC
  Administered 2021-09-05: 200 mg via INTRAMUSCULAR

## 2021-09-05 NOTE — Progress Notes (Signed)
Patient here for a Testosterone injection 200 mg/ml 1 ml IM injection in LEFT upper outer quadrant. Patient tolerated well and will return in 1 week for his next injection

## 2021-09-09 ENCOUNTER — Other Ambulatory Visit (HOSPITAL_COMMUNITY): Payer: Self-pay

## 2021-09-10 ENCOUNTER — Other Ambulatory Visit (HOSPITAL_COMMUNITY): Payer: Self-pay

## 2021-09-11 ENCOUNTER — Other Ambulatory Visit: Payer: Self-pay | Admitting: Internal Medicine

## 2021-09-11 DIAGNOSIS — E349 Endocrine disorder, unspecified: Secondary | ICD-10-CM

## 2021-09-12 ENCOUNTER — Other Ambulatory Visit: Payer: Self-pay

## 2021-09-12 ENCOUNTER — Ambulatory Visit (INDEPENDENT_AMBULATORY_CARE_PROVIDER_SITE_OTHER): Payer: BC Managed Care – PPO

## 2021-09-12 DIAGNOSIS — E349 Endocrine disorder, unspecified: Secondary | ICD-10-CM | POA: Diagnosis not present

## 2021-09-12 MED ORDER — TESTOSTERONE CYPIONATE 200 MG/ML IM SOLN
200.0000 mg | Freq: Once | INTRAMUSCULAR | Status: AC
  Administered 2021-09-12: 200 mg via INTRAMUSCULAR

## 2021-09-12 NOTE — Progress Notes (Signed)
Patient here for a Testosterone injection 200 mg/ml 1 ml IM injection in RIGHT upper outer quadrant. Patient tolerated well and will return in 1 week for his next injection

## 2021-09-19 ENCOUNTER — Ambulatory Visit (INDEPENDENT_AMBULATORY_CARE_PROVIDER_SITE_OTHER): Payer: BC Managed Care – PPO

## 2021-09-19 ENCOUNTER — Other Ambulatory Visit: Payer: Self-pay

## 2021-09-19 VITALS — BP 130/88 | HR 81 | Temp 97.9°F | Wt 318.8 lb

## 2021-09-19 DIAGNOSIS — E349 Endocrine disorder, unspecified: Secondary | ICD-10-CM | POA: Diagnosis not present

## 2021-09-19 MED ORDER — TESTOSTERONE CYPIONATE 200 MG/ML IM SOLN
200.0000 mg | Freq: Once | INTRAMUSCULAR | Status: AC
  Administered 2021-09-19: 200 mg via INTRAMUSCULAR

## 2021-09-19 NOTE — Progress Notes (Signed)
Patient here for a Testosterone injection 200 mg/ml 1 ml IM injection in LEFT upper outer quadrant. Patient tolerated well and will return in 1 week for his next injection.

## 2021-09-26 ENCOUNTER — Other Ambulatory Visit: Payer: Self-pay

## 2021-09-26 ENCOUNTER — Ambulatory Visit (INDEPENDENT_AMBULATORY_CARE_PROVIDER_SITE_OTHER): Payer: BC Managed Care – PPO

## 2021-09-26 VITALS — BP 120/82 | HR 75 | Temp 98.1°F | Wt 314.0 lb

## 2021-09-26 DIAGNOSIS — E349 Endocrine disorder, unspecified: Secondary | ICD-10-CM | POA: Diagnosis not present

## 2021-09-26 MED ORDER — TESTOSTERONE CYPIONATE 100 MG/ML IM SOLN: 100.0000 mg | Freq: Once | INTRAMUSCULAR | Status: DC

## 2021-09-26 MED ORDER — TESTOSTERONE CYPIONATE 200 MG/ML IM SOLN
200.0000 mg | Freq: Once | INTRAMUSCULAR | Status: AC
  Administered 2021-09-26: 100 mg via INTRAMUSCULAR

## 2021-09-26 NOTE — Progress Notes (Signed)
Patient here for a Testosterone injection 200 mg/ml 1 ml IM injection in RIGHT upper outer quadrant. Patient tolerated well and will return in 1 week for his next injection.

## 2021-10-03 ENCOUNTER — Ambulatory Visit (INDEPENDENT_AMBULATORY_CARE_PROVIDER_SITE_OTHER): Payer: BC Managed Care – PPO

## 2021-10-03 ENCOUNTER — Other Ambulatory Visit: Payer: Self-pay

## 2021-10-03 VITALS — BP 140/90 | HR 75 | Temp 97.7°F | Wt 311.0 lb

## 2021-10-03 DIAGNOSIS — E349 Endocrine disorder, unspecified: Secondary | ICD-10-CM | POA: Diagnosis not present

## 2021-10-03 MED ORDER — TESTOSTERONE CYPIONATE 200 MG/ML IM SOLN
200.0000 mg | Freq: Once | INTRAMUSCULAR | Status: AC
  Administered 2021-10-03: 200 mg via INTRAMUSCULAR

## 2021-10-03 NOTE — Progress Notes (Signed)
Patient here for a Testosterone injection 200 mg/ml 1 ml IM injection in LEFT upper outer quadrant. Patient tolerated well and will return in 1 week for his next injection

## 2021-10-07 ENCOUNTER — Other Ambulatory Visit: Payer: Self-pay | Admitting: Nurse Practitioner

## 2021-10-07 ENCOUNTER — Other Ambulatory Visit (HOSPITAL_COMMUNITY): Payer: Self-pay

## 2021-10-07 DIAGNOSIS — R7301 Impaired fasting glucose: Secondary | ICD-10-CM

## 2021-10-07 MED ORDER — MOUNJARO 10 MG/0.5ML ~~LOC~~ SOAJ
10.0000 mg | SUBCUTANEOUS | 3 refills | Status: DC
  Filled 2021-10-07: qty 2, 28d supply, fill #0

## 2021-10-08 ENCOUNTER — Other Ambulatory Visit (HOSPITAL_COMMUNITY): Payer: Self-pay

## 2021-10-09 ENCOUNTER — Other Ambulatory Visit: Payer: Self-pay | Admitting: Internal Medicine

## 2021-10-09 ENCOUNTER — Other Ambulatory Visit (HOSPITAL_COMMUNITY): Payer: Self-pay

## 2021-10-09 MED ORDER — TIRZEPATIDE 12.5 MG/0.5ML ~~LOC~~ SOAJ
12.5000 mg | SUBCUTANEOUS | 0 refills | Status: DC
  Filled 2021-10-09: qty 6, 84d supply, fill #0

## 2021-10-10 ENCOUNTER — Other Ambulatory Visit: Payer: Self-pay | Admitting: Internal Medicine

## 2021-10-10 ENCOUNTER — Other Ambulatory Visit: Payer: Self-pay

## 2021-10-10 ENCOUNTER — Ambulatory Visit (INDEPENDENT_AMBULATORY_CARE_PROVIDER_SITE_OTHER): Payer: BC Managed Care – PPO

## 2021-10-10 ENCOUNTER — Other Ambulatory Visit (HOSPITAL_COMMUNITY): Payer: Self-pay

## 2021-10-10 VITALS — BP 128/80 | HR 87 | Temp 97.3°F | Wt 308.0 lb

## 2021-10-10 DIAGNOSIS — Z23 Encounter for immunization: Secondary | ICD-10-CM

## 2021-10-10 DIAGNOSIS — E349 Endocrine disorder, unspecified: Secondary | ICD-10-CM

## 2021-10-10 MED ORDER — TESTOSTERONE CYPIONATE 200 MG/ML IM SOLN
200.0000 mg | Freq: Once | INTRAMUSCULAR | Status: AC
  Administered 2021-10-10: 100 mg via INTRAMUSCULAR

## 2021-10-10 MED ORDER — TIRZEPATIDE 12.5 MG/0.5ML ~~LOC~~ SOAJ: 12.5000 mg | SUBCUTANEOUS | 0 refills | Status: DC

## 2021-10-10 MED ORDER — TIRZEPATIDE 12.5 MG/0.5ML ~~LOC~~ SOAJ
12.5000 mg | SUBCUTANEOUS | 0 refills | Status: DC
  Filled 2021-10-10: qty 2, 28d supply, fill #0
  Filled 2021-11-05: qty 2, 28d supply, fill #1
  Filled 2021-12-04: qty 2, 28d supply, fill #2

## 2021-10-11 ENCOUNTER — Other Ambulatory Visit (HOSPITAL_COMMUNITY): Payer: Self-pay

## 2021-10-14 ENCOUNTER — Other Ambulatory Visit: Payer: Self-pay | Admitting: Internal Medicine

## 2021-10-17 ENCOUNTER — Ambulatory Visit: Payer: BC Managed Care – PPO

## 2021-10-21 ENCOUNTER — Ambulatory Visit (INDEPENDENT_AMBULATORY_CARE_PROVIDER_SITE_OTHER): Payer: BC Managed Care – PPO

## 2021-10-21 ENCOUNTER — Other Ambulatory Visit: Payer: Self-pay

## 2021-10-21 VITALS — BP 136/84 | HR 67 | Temp 97.5°F | Wt 309.0 lb

## 2021-10-21 DIAGNOSIS — E349 Endocrine disorder, unspecified: Secondary | ICD-10-CM | POA: Diagnosis not present

## 2021-10-21 MED ORDER — TESTOSTERONE CYPIONATE 200 MG/ML IM SOLN
200.0000 mg | Freq: Once | INTRAMUSCULAR | Status: AC
  Administered 2021-10-21: 100 mg via INTRAMUSCULAR

## 2021-10-21 NOTE — Progress Notes (Signed)
Patient here for a Testosterone injection 200 mg/ml 1 ml IM injection in RIGHT upper outer quadrant. Patient tolerated well and will return in 1 week for his next injection

## 2021-10-23 ENCOUNTER — Other Ambulatory Visit: Payer: Self-pay | Admitting: Internal Medicine

## 2021-10-24 ENCOUNTER — Ambulatory Visit: Payer: BC Managed Care – PPO

## 2021-10-25 ENCOUNTER — Other Ambulatory Visit (HOSPITAL_COMMUNITY): Payer: Self-pay

## 2021-10-28 ENCOUNTER — Ambulatory Visit: Payer: BC Managed Care – PPO

## 2021-10-28 ENCOUNTER — Other Ambulatory Visit: Payer: Self-pay | Admitting: Adult Health

## 2021-10-29 ENCOUNTER — Other Ambulatory Visit: Payer: Self-pay

## 2021-10-29 ENCOUNTER — Ambulatory Visit (INDEPENDENT_AMBULATORY_CARE_PROVIDER_SITE_OTHER): Payer: BC Managed Care – PPO

## 2021-10-29 DIAGNOSIS — E349 Endocrine disorder, unspecified: Secondary | ICD-10-CM | POA: Diagnosis not present

## 2021-10-29 MED ORDER — TESTOSTERONE CYPIONATE 200 MG/ML IM SOLN
200.0000 mg | Freq: Once | INTRAMUSCULAR | Status: AC
  Administered 2021-10-29: 100 mg via INTRAMUSCULAR

## 2021-10-29 NOTE — Progress Notes (Signed)
Patient here for a Testosterone injection 200 mg/ml 1 ml IM injection in LEFT upper outer quadrant. Patient tolerated well and will return in 1 week for his next injection

## 2021-10-31 ENCOUNTER — Ambulatory Visit: Payer: BC Managed Care – PPO

## 2021-11-04 ENCOUNTER — Ambulatory Visit: Payer: BC Managed Care – PPO

## 2021-11-05 ENCOUNTER — Other Ambulatory Visit (HOSPITAL_COMMUNITY): Payer: Self-pay

## 2021-11-05 ENCOUNTER — Ambulatory Visit (INDEPENDENT_AMBULATORY_CARE_PROVIDER_SITE_OTHER): Payer: BC Managed Care – PPO

## 2021-11-05 ENCOUNTER — Other Ambulatory Visit: Payer: Self-pay

## 2021-11-05 VITALS — BP 112/80 | HR 75 | Temp 97.7°F | Wt 309.2 lb

## 2021-11-05 DIAGNOSIS — E349 Endocrine disorder, unspecified: Secondary | ICD-10-CM | POA: Diagnosis not present

## 2021-11-05 MED ORDER — TESTOSTERONE CYPIONATE 200 MG/ML IM SOLN
200.0000 mg | Freq: Once | INTRAMUSCULAR | Status: AC
  Administered 2021-11-05: 200 mg via INTRAMUSCULAR

## 2021-11-05 NOTE — Progress Notes (Signed)
Patient here for a Testosterone injection 200 mg/ml 1 ml IM injection in Right upper outer quadrant. Patient tolerated well and will return in 1 week for his next injection ?

## 2021-11-07 ENCOUNTER — Ambulatory Visit: Payer: BC Managed Care – PPO

## 2021-11-11 ENCOUNTER — Ambulatory Visit (INDEPENDENT_AMBULATORY_CARE_PROVIDER_SITE_OTHER): Payer: BC Managed Care – PPO

## 2021-11-11 ENCOUNTER — Other Ambulatory Visit: Payer: Self-pay

## 2021-11-11 VITALS — BP 118/74 | HR 79 | Temp 98.1°F | Wt 304.6 lb

## 2021-11-11 DIAGNOSIS — E349 Endocrine disorder, unspecified: Secondary | ICD-10-CM

## 2021-11-11 MED ORDER — TESTOSTERONE CYPIONATE 200 MG/ML IM SOLN
200.0000 mg | Freq: Once | INTRAMUSCULAR | Status: AC
  Administered 2021-11-11: 100 mg via INTRAMUSCULAR

## 2021-11-11 NOTE — Progress Notes (Signed)
Patient here for a Testosterone injection 200 mg/ml 1 ml IM injection in Left upper outer quadrant. Patient tolerated well and will return in 1 week for his next injection ?

## 2021-11-18 ENCOUNTER — Other Ambulatory Visit: Payer: Self-pay

## 2021-11-18 ENCOUNTER — Ambulatory Visit (INDEPENDENT_AMBULATORY_CARE_PROVIDER_SITE_OTHER): Payer: BC Managed Care – PPO

## 2021-11-18 VITALS — BP 124/90 | HR 91 | Temp 97.2°F | Wt 301.0 lb

## 2021-11-18 DIAGNOSIS — E349 Endocrine disorder, unspecified: Secondary | ICD-10-CM

## 2021-11-18 MED ORDER — TESTOSTERONE CYPIONATE 200 MG/ML IM SOLN
200.0000 mg | Freq: Once | INTRAMUSCULAR | Status: AC
  Administered 2021-11-18: 200 mg via INTRAMUSCULAR

## 2021-11-18 NOTE — Progress Notes (Signed)
Patient here for a Testosterone injection 200 mg/ml 1 ml IM injection in right upper outer quadrant. Patient tolerated well and will return in 1 week for his next injection ?

## 2021-11-25 ENCOUNTER — Ambulatory Visit (INDEPENDENT_AMBULATORY_CARE_PROVIDER_SITE_OTHER): Payer: BC Managed Care – PPO

## 2021-11-25 ENCOUNTER — Other Ambulatory Visit: Payer: Self-pay

## 2021-11-25 VITALS — BP 110/78 | HR 84 | Temp 97.7°F | Wt 304.6 lb

## 2021-11-25 DIAGNOSIS — E349 Endocrine disorder, unspecified: Secondary | ICD-10-CM | POA: Diagnosis not present

## 2021-11-25 MED ORDER — TESTOSTERONE CYPIONATE 200 MG/ML IM SOLN
200.0000 mg | Freq: Once | INTRAMUSCULAR | Status: AC
  Administered 2021-11-25: 200 mg via INTRAMUSCULAR

## 2021-11-25 NOTE — Progress Notes (Signed)
Patient here for a Testosterone injection 200 mg/ml 1 ml IM injection in LEFT upper outer quadrant. Patient tolerated well and will return in 1 week for his next injection ?

## 2021-12-02 ENCOUNTER — Ambulatory Visit: Payer: BC Managed Care – PPO

## 2021-12-03 NOTE — Progress Notes (Signed)
? ?Annual  Screening/Preventative Visit  ?& Comprehensive Evaluation & Examination ? ?Future Appointments  ?Date Time Provider Department  ?12/04/2021               CPE 11:00 AM Unk Pinto, MD [facility]  ?12/09/2021  3:00 PM [facility] NURSE [facility]  ?12/11/2022               CPE  11:00 AM Unk Pinto, MD [facility]  ? ?    ?     This very nice 66 y.o. Christopher [name] presents for a Screening /Preventative Visit & comprehensive evaluation and management of multiple medical co-morbidities.  Patient has been followed for HTN, HLD, and Vitamin D Deficiency.  In 2019,  Abd CT scan showed Aorto-Iliac Atherosclerosis. ? ? ?    HTN predates since  2005. Patient's BP has been controlled at home.  Today's BP is at goal -  128/Christopher. Patient denies any cardiac symptoms as chest pain, palpitations, shortness of breath, dizziness or ankle swelling. ? ? ?    Patient's hyperlipidemia is controlled with diet and medications. Patient denies myalgias or other medication SE's.   Last lipids were not at goal : ? ?Lab Results  ?Component Value Date  ? CHOL 167 06/12/2021  ? HDL 38 (L) 06/12/2021  ? LDLCALC 111 (H) 06/12/2021  ? TRIG 85 06/12/2021  ? CHOLHDL 4.4 06/12/2021  ? ? ? ?    Patient has hx/o  Patient has  Morbid Obesity  (BMI 40.88)  and  admit compulsive overeating  with hx/o glucose intolerance /prediabetes  (A1c 5.9% /2010 and 6.0% 12 /2021) and patient denies reactive hypoglycemic symptoms, visual blurring, diabetic polys or paresthesias. Last A1c was at goal : ?  ?Lab Results  ?Component Value Date  ? HGBA1C 5.5 06/12/2021  ?  ? ? ?    Finally, patient has history of Vitamin D Deficiency ("17"/2008) and last vitamin D was at goal : ?  ?Lab Results  ?Component Value Date  ? VD25OH 93 06/12/2021  ? ? ? ?Current Outpatient Medications on File Prior to Visit  ?Medication Sig  ? acyclovir (ZOVIRAX) 800 MG tablet TAKE 1 TABLET  DAILY FOR HSV  ? allopurinol (ZYLOPRIM) 300 MG tablet Take  1 tablet  Daily  to Prevent Gout  ?  ALPRAZolam (XANAX) 1 MG tablet Take 1/2 to 1 tablet 2 to 3 x / day  ONLY if needed   ? aspirin EC 81 MG tablet Take daily.  ? atenolol (TENORMIN) 100 MG tablet TAKE 1 TABLET B DAILY   ? buPROPion  XL) 300 MG 24 hr tablet Take  1 tablet  every Morning   ? Cyanocobalamin (VITAMIN B 12 PO) Take 1 tablet daily.  ? cyclobenzaprine (FLEXERIL) 10 MG tablet Take  1/2 to 1 tablet  3 x /day   as needed   ? esomeprazole (NEXIUM) 40 MG capsule TAKE 1 CAPSULE  TWICE DAILY F  ? EUCRISA 2 % OINT APPLY  TWO TIMES A DAY  ? fenofibrate 160 MG tablet Take  1 tablet  Daily    ? fish oil-omega-3 fatty acids 1000 MG capsule Take 1 g daily.  ? Flaxseed, Linseed, (FLAX SEEDS PO) Take   ? magnesium oxide (MAG-OX) 400 MG tablet Take 400 mg 2 (two) times daily.   ? montelukast (SINGULAIR) 10 MG tablet TAKE 1 TABLET  DAILY FOR ALLERGIES  ? Multiple Vitamin (MULTIVITAMIN WITH MINERALS) TABS tablet Take 1 tablet daily.  ? potassium chloride (KLOR-CON) 10 MEQ  tablet TAKE 1 TABLET  3  TIMES DAILY  ? tadalafil (CIALIS) 20 MG tablet Take  1/2 to 1 tablet  every 2 to 3 days  as needed   ? terazosin (HYTRIN) 2 MG capsule TAKE 1 CAPSULE  AT  BEDTIME   ? testosterone cypio  200 MG/ML inj ADMINISTER 1 ML IM  EVERY WEEK  ? tirzepatide (MOUNJARO) 12.5 MG/0.5ML Pen Inject 12.5 mg into the skin once a week.  ? torsemide (DEMADEX) 20 MG tablet Take  1 tablet  2 x /day  am & pm  for severe leg swelling  ? valsartan (DIOVAN) 320 MG tablet Take  1 tablet  Daily for BP  ? vitamin C (ASCORBIC ACID) 500 MG tablet Take daily.   ? Vitamin D, Ergocalciferol, (DRISDOL) 1.25 MG (50000 UNIT) CAPS capsule TAKE 1 CAPSULE  DAILY   ? Zinc 50 MG TABS Take  daily.  ? ? ? ?No Known Allergies ? ? ?Past Medical History:  ?Diagnosis Date  ? Allergy   ? Anxiety   ? Asthma   ? as a child  ? Barrett's esophagus   ? Closed fracture of nasal septum 06/02/2016  ? GERD (gastroesophageal reflux disease)   ? Gout   ? never had a flare up of gout  ? H/O hiatal hernia   ? Hyperlipidemia   ?  Hypertension   ? Prediabetes   ? Sleep apnea   ? does not use  cpap  ? Vitamin D deficiency   ? ? ? ?Health Maintenance  ?Topic Date Due  ? INFLUENZA VACCINE  04/01/2022  ? TETANUS/TDAP  08/02/2023  ? COVID-19 Vaccine  Completed  ? Zoster Vaccines- Shingrix  Completed  ? HPV VACCINES  Aged Out  ? Hepatitis C Screening  Discontinued  ? HIV Screening  Discontinued  ? ? ? ?Immunization History  ?Administered Date(s) Administered  ? Influenza Inj Mdck Quad 07/06/2020  ? Influenza Inj Mdck Quad  07/02/2017, 06/15/2018  ? Influenza Split 06/28/2014, 06/28/2015  ? Influenza,inj,Quad  06/19/2019, 06/12/2021  ? Influenza,inj,quad 08/11/2016  ? Influenza 09/01/2012, 06/26/2020  ? PFIZER SARS-COV-2 Vacc 10/17/2019, 11/07/2019, 09/02/2020  ? PPD Test 06/28/2014, 01/18/2016, 08/11/2016, 10/12/2017, 11/01/2018, 11/21/2019, 11/28/2020  ? Pfizer [employer]  01/01/2021  ? Pneumococcal-23 09/01/1998  ? Tdap 08/01/2013  ? Zoster Recombinat (Shingrix) 06/19/2019, 08/23/2019  ? ?Last Colon - 07/03/2009 - Dr Deatra Ina and recc 10 year f/u due Nov 2020. ?                    - 10.05.2020 - Dr Loletha Carrow - poor prep - recc 1 year f/u colon -due ?  ?Last EGD   -  10.05.2020 - Dr Loletha Carrow  - Neg Bx for  hx/o Barrett's - Recc 3 yr f/u  ?  ? ?Past Surgical History:  ?Procedure Laterality Date  ? CLOSED REDUCTION NASAL FRACTURE N/A 06/10/2016  ? Procedure: CLOSED REDUCTION NASAL FRACTURE FOREHEAD SUTURE REMOVAL;  Surgeon: [name], DO;  Location: [facility];  Service: Plastics;  Laterality: N/A;  ? COLONOSCOPY    ? [place]  ? Left knee Arthroscopy    ? Right knee arthroscopy    ? UPPER GASTROINTESTINAL ENDOSCOPY    ? last egd 12-7*2012  ? ? ? ?Family History  ?Problem Relation Age of Onset  ? Arthritis Mother   ? Lung cancer Mother   ? Heart disease Mother   ? Colon cancer Maternal Uncle   ?  Stroke Brother   ? Prostate cancer Father   ? Esophageal cancer Neg Hx   ? Rectal cancer Neg Hx   ? Stomach  cancer Neg Hx   ? Liver cancer Neg Hx   ? Pancreatic cancer Neg Hx   ? Colon polyps Neg Hx   ? Crohn's disease Neg Hx   ? ? ? ?Social History  ? ?Tobacco Use  ? Smoking status: Former  ?  Years: 6.00  ?  Types: Cigarettes  ?  Quit date: 02/07/2013  ?  Years since quitting: 8.8  ? Smokeless tobacco: Never  ?Vaping Use  ? Vaping Use: Never used  ?Substance Use Topics  ? Alcohol use: No  ?  Alcohol/week: 0.0 standard drinks  ? Drug use: No  ? ? ? ? ROS ?Constitutional: Denies fever, chills, weight loss/gain, headaches, insomnia,  night sweats or change in appetite. Does c/o fatigue. ?Eyes: Denies redness, blurred vision, diplopia, discharge, itchy or watery eyes.  ?ENT: Denies discharge, congestion, post nasal drip, epistaxis, sore throat, earache, hearing loss, dental pain, Tinnitus, Vertigo, Sinus pain or snoring.  ?Cardio: Denies chest pain, palpitations, irregular heartbeat, syncope, dyspnea, diaphoresis, orthopnea, PND, claudication or edema ?Respiratory: denies cough, dyspnea, DOE, pleurisy, hoarseness, laryngitis or wheezing.  ?Gastrointestinal: Denies dysphagia, heartburn, reflux, water brash, pain, cramps, nausea, vomiting, bloating, diarrhea, constipation, hematemesis, melena, hematochezia, jaundice or hemorrhoids ?Genitourinary: Denies dysuria, frequency, urgency, nocturia, hesitancy, discharge, hematuria or flank pain ?Musculoskeletal: Denies arthralgia, myalgia, stiffness, Jt. Swelling, pain, limp or strain/sprain. Denies Falls. ?Skin: Denies puritis, rash, hives, warts, acne, eczema or change in skin lesion ?Neuro: No weakness, tremor, incoordination, spasms, paresthesia or pain ?Psychiatric: Denies confusion, memory loss or sensory loss. Denies Depression. ?Endocrine: Denies change in weight, skin, hair change, nocturia, and paresthesia, diabetic polys, visual blurring or hyper / hypo glycemic episodes.  ?Heme/Lymph: No excessive bleeding, bruising or enlarged lymph nodes. ? ? ?Physical Exam ? ?BP 128/Christopher    Pulse 71   Temp 97.9 ?F (36.6 ?C)   Resp 16   Ht 6' (1.829 m)   Wt (!) 301 lb 6.4 oz (136.7 kg)   SpO2 96%   BMI 40.88 kg/m?  ? ?General Appearance: Well nourished and well groomed and in no apparent distress

## 2021-12-04 ENCOUNTER — Ambulatory Visit (INDEPENDENT_AMBULATORY_CARE_PROVIDER_SITE_OTHER): Payer: BC Managed Care – PPO | Admitting: Internal Medicine

## 2021-12-04 ENCOUNTER — Encounter: Payer: Self-pay | Admitting: Internal Medicine

## 2021-12-04 ENCOUNTER — Ambulatory Visit (INDEPENDENT_AMBULATORY_CARE_PROVIDER_SITE_OTHER): Payer: BC Managed Care – PPO

## 2021-12-04 ENCOUNTER — Other Ambulatory Visit (HOSPITAL_COMMUNITY): Payer: Self-pay

## 2021-12-04 VITALS — BP 128/78 | HR 71 | Temp 97.9°F | Resp 16 | Ht 72.0 in | Wt 301.4 lb

## 2021-12-04 DIAGNOSIS — E7439 Other disorders of intestinal carbohydrate absorption: Secondary | ICD-10-CM

## 2021-12-04 DIAGNOSIS — Z0000 Encounter for general adult medical examination without abnormal findings: Secondary | ICD-10-CM | POA: Diagnosis not present

## 2021-12-04 DIAGNOSIS — E349 Endocrine disorder, unspecified: Secondary | ICD-10-CM

## 2021-12-04 DIAGNOSIS — Z8249 Family history of ischemic heart disease and other diseases of the circulatory system: Secondary | ICD-10-CM

## 2021-12-04 DIAGNOSIS — I10 Essential (primary) hypertension: Secondary | ICD-10-CM | POA: Diagnosis not present

## 2021-12-04 DIAGNOSIS — I700 Atherosclerosis of aorta: Secondary | ICD-10-CM

## 2021-12-04 DIAGNOSIS — N401 Enlarged prostate with lower urinary tract symptoms: Secondary | ICD-10-CM

## 2021-12-04 DIAGNOSIS — Z125 Encounter for screening for malignant neoplasm of prostate: Secondary | ICD-10-CM

## 2021-12-04 DIAGNOSIS — E559 Vitamin D deficiency, unspecified: Secondary | ICD-10-CM

## 2021-12-04 DIAGNOSIS — Z79899 Other long term (current) drug therapy: Secondary | ICD-10-CM

## 2021-12-04 DIAGNOSIS — Z136 Encounter for screening for cardiovascular disorders: Secondary | ICD-10-CM

## 2021-12-04 DIAGNOSIS — G4733 Obstructive sleep apnea (adult) (pediatric): Secondary | ICD-10-CM

## 2021-12-04 DIAGNOSIS — Z0001 Encounter for general adult medical examination with abnormal findings: Secondary | ICD-10-CM

## 2021-12-04 DIAGNOSIS — Z111 Encounter for screening for respiratory tuberculosis: Secondary | ICD-10-CM

## 2021-12-04 DIAGNOSIS — M109 Gout, unspecified: Secondary | ICD-10-CM

## 2021-12-04 DIAGNOSIS — R5383 Other fatigue: Secondary | ICD-10-CM

## 2021-12-04 DIAGNOSIS — Z87891 Personal history of nicotine dependence: Secondary | ICD-10-CM

## 2021-12-04 DIAGNOSIS — Z1211 Encounter for screening for malignant neoplasm of colon: Secondary | ICD-10-CM

## 2021-12-04 DIAGNOSIS — E782 Mixed hyperlipidemia: Secondary | ICD-10-CM

## 2021-12-04 DIAGNOSIS — N138 Other obstructive and reflux uropathy: Secondary | ICD-10-CM

## 2021-12-04 MED ORDER — TESTOSTERONE CYPIONATE 200 MG/ML IM SOLN
200.0000 mg | Freq: Once | INTRAMUSCULAR | Status: AC
  Administered 2021-12-04: 100 mg via INTRAMUSCULAR

## 2021-12-04 NOTE — Patient Instructions (Addendum)
Due to recent changes in healthcare laws, you may see the results of your imaging and laboratory studies on MyChart before your provider has had a chance to review them.  We understand that in some cases there may be results that are confusing or concerning to you. Not all laboratory results come back in the same time frame and the provider may be waiting for multiple results in order to interpret others.  Please give [place] 48 hours in order for your provider to thoroughly review all the results before contacting the office for clarification of your results.  ? ?++++++++++++++++++++++++++++++++++++++ ? Vit D  & ?Vit C 1,000 mg   ?are recommended to help protect  ?against the Covid-19 and other Corona viruses.  ? ? Also it's recommended  ?to take  ?Zinc 50 mg  ?to help  ?protect against the Covid-19   ?and best place to get ? is also on [employer]  ?and don't pay more than 6-8 cents /pill !  ?=============================== ?Coronavirus (COVID-19) Are you at risk? ? ?Are you at risk for the Coronavirus (COVID-19)? ? ?To be considered HIGH RISK for Coronavirus (COVID-19), you have to meet the following criteria: ? ?Traveled to [place], [place], [place], [place] or [place]; or in the [place] to [place], [place], [place]  ?or [place]; and have fever, cough, and shortness of breath within the last 2 weeks of travel OR ?Been in close contact with a person diagnosed with COVID-19 within the last 2 weeks and have  ?fever, cough,and shortness of breath ? ?IF YOU DO NOT MEET THESE CRITERIA, YOU ARE CONSIDERED LOW RISK FOR COVID-19. ? ?What to do if you are HIGH RISK for COVID-19? ? ?If you are having a medical emergency, call 911. ?Seek medical care right away. Before you go to a doctor?s office, urgent care or emergency department, ? call ahead and tell them about your recent travel, contact with someone diagnosed with COVID-19  ? and your symptoms.  ?You should receive instructions from your physician?s office  regarding next steps of care.  ?When you arrive at healthcare provider, tell the healthcare staff immediately you have returned from  ?visiting [place], [place], [place], [place] or [place]; or traveled in the [place] to [place], [place],  ?[place] or [place] in the last two weeks or you have been in close contact with a person diagnosed with  ?COVID-19 in the last 2 weeks.   ?Tell the health care staff about your symptoms: fever, cough and shortness of breath. ?After you have been seen by a medical provider, you will be either: ?Tested for (COVID-19) and discharged home on quarantine except to seek medical care if  ?symptoms worsen, and asked to  ?Stay home and avoid contact with others until you get your results (4-5 days)  ?Avoid travel on public transportation if possible (such as bus, train, or airplane) or ?Sent to the Emergency Department by EMS for evaluation, COVID-19 testing  and  ?possible admission depending on your condition and test results. ? ?What to do if you are LOW RISK for COVID-19? ? ?Reduce your risk of any infection by using the same precautions used for avoiding the common cold or flu:  ?Wash your hands often with soap and warm water for at least 20 seconds.  If soap and water are not readily available,  ?use an alcohol-based hand sanitizer with at least 60% alcohol.  ?If coughing or sneezing, cover your mouth and nose by coughing  or sneezing into the elbow areas of your shirt or coat, ? into a tissue or into your sleeve (not your hands). ?Avoid shaking hands with others and consider head nods or verbal greetings only. ?Avoid touching your eyes, nose, or mouth with unwashed hands.  ?Avoid close contact with people who are sick. ?Avoid places or events with large numbers of people in one location, like concerts or sporting events. ?Carefully consider travel plans you have or are making. ?If you are planning any travel outside or inside the [place], visit the CDC?s Travelers? Health  webpage for the latest health notices. ?If you have some symptoms but not all symptoms, continue to monitor at home and seek medical attention  ?if your symptoms worsen. ?If you are having a medical emergency, call 911. ?>>>>>>>>>>>>>>>>>>>>>>>>>>>> ?Preventive Care for Adults ? ?A healthy lifestyle and preventive care can promote health and wellness. Preventive health guidelines for men include the following key practices: ?A routine yearly physical is a good way to check with your health care provider about your health and preventative screening. It is a chance to share any concerns and updates on your health and to receive a thorough exam. ?Visit your dentist for a routine exam and preventative care every 6 months. Brush your teeth twice a day and floss once a day. Good oral hygiene prevents tooth decay and gum disease. ?The frequency of eye exams is based on your age, health, family medical history, use of contact lenses, and other factors. Follow your health care provider's recommendations for frequency of eye exams. ?Eat a healthy diet. Foods such as vegetables, fruits, whole grains, low-fat dairy products, and lean protein foods contain the nutrients you need without too many calories. Decrease your intake of foods high in solid fats, added sugars, and salt. Eat the right amount of calories for you. Get information about a proper diet from your health care provider, if necessary. ?Regular physical exercise is one of the most important things you can do for your health. Most adults should get at least 150 minutes of moderate-intensity exercise (any activity that increases your heart rate and causes you to sweat) each week. In addition, most adults need muscle-strengthening exercises on 2 or more days a week. ?Maintain a healthy weight. The body mass index (BMI) is a screening tool to identify possible weight problems. It provides an estimate of body fat based on height and weight. Your health care provider can  find your BMI and can help you achieve or maintain a healthy weight. For adults 20 years and older: ?A BMI below 18.5 is considered underweight. ?A BMI of 18.5 to 24.9 is normal. ?A BMI of 25 to 29.9 is considered overweight. ?A BMI of 30 and above is considered obese. ?Maintain normal blood lipids and cholesterol levels by exercising and minimizing your intake of saturated fat. Eat a balanced diet with plenty of fruit and vegetables. Blood tests for lipids and cholesterol should begin at age 56 and be repeated every 5 years. If your lipid or cholesterol levels are high, you are over 50, or you are at high risk for heart disease, you may need your cholesterol levels checked more frequently. Ongoing high lipid and cholesterol levels should be treated with medicines if diet and exercise are not working. ?If you smoke, find out from your health care provider how to quit. If you do not use tobacco, do not start. ?Lung cancer screening is recommended for adults aged 29-80 years who are at high risk for  developing lung cancer because of a history of smoking. A yearly low-dose CT scan of the lungs is recommended for people who have at least a 30-pack-year history of smoking and are a current smoker or have quit within the past 15 years. A pack year of smoking is smoking an average of 1 pack of cigarettes a day for 1 year (for example: 1 pack a day for 30 years or 2 packs a day for 15 years). Yearly screening should continue until the smoker has stopped smoking for at least 15 years. Yearly screening should be stopped for people who develop a health problem that would prevent them from having lung cancer treatment. ?If you choose to drink alcohol, do not have more than 2 drinks per day. One drink is considered to be 12 ounces (355 mL) of beer, 5 ounces (148 mL) of wine, or 1.5 ounces (44 mL) of liquor. ?Avoid use of street drugs. Do not share needles with anyone. Ask for help if you need support or instructions about  stopping the use of drugs. ?High blood pressure causes heart disease and increases the risk of stroke. Your blood pressure should be checked at least every 1-2 years. Ongoing high blood pressure should be treated

## 2021-12-04 NOTE — Progress Notes (Signed)
Patient here for a Testosterone injection 200 mg/ml 1 ml IM injection in RIGHT upper outer quadrant. Patient tolerated well and will return in 1 week for his next injection. Vitals taken at appointment today. 

## 2021-12-05 LAB — MICROALBUMIN / CREATININE URINE RATIO
Creatinine, Urine: 224 mg/dL (ref 20–320)
Microalb Creat Ratio: 2 mcg/mg creat (ref <30)
Microalb, Ur: 0.4 mg/dL

## 2021-12-05 LAB — CBC WITH DIFFERENTIAL/PLATELET
Absolute Monocytes: 762 cells/uL (ref 200–950)
Basophils Absolute: 18 cells/uL (ref 0–200)
Basophils Relative: 0.3 %
Eosinophils Absolute: 150 cells/uL (ref 15–500)
Eosinophils Relative: 2.5 %
HCT: 45.1 % (ref 38.5–50.0)
Hemoglobin: 15.1 g/dL (ref 13.2–17.1)
Lymphs Abs: 1992 cells/uL (ref 850–3900)
MCH: 29.7 pg (ref 27.0–33.0)
MCHC: 33.5 g/dL (ref 32.0–36.0)
MCV: 88.8 fL (ref 80.0–100.0)
MPV: 11.6 fL (ref 7.5–12.5)
Monocytes Relative: 12.7 %
Neutro Abs: 3078 cells/uL (ref 1500–7800)
Neutrophils Relative %: 51.3 %
Platelets: 123 10*3/uL — ABNORMAL LOW (ref 140–400)
RBC: 5.08 10*6/uL (ref 4.20–5.80)
RDW: 13.6 % (ref 11.0–15.0)
Total Lymphocyte: 33.2 %
WBC: 6 10*3/uL (ref 3.8–10.8)

## 2021-12-05 LAB — URINALYSIS, ROUTINE W REFLEX MICROSCOPIC
Bilirubin Urine: NEGATIVE
Glucose, UA: NEGATIVE
Hgb urine dipstick: NEGATIVE
Ketones, ur: NEGATIVE
Leukocytes,Ua: NEGATIVE
Nitrite: NEGATIVE
Protein, ur: NEGATIVE
Specific Gravity, Urine: 1.019 (ref 1.001–1.035)
pH: 7.5 (ref 5.0–8.0)

## 2021-12-05 LAB — COMPLETE METABOLIC PANEL WITH GFR
AG Ratio: 1.6 (calc) (ref 1.0–2.5)
ALT: 15 U/L (ref 9–46)
AST: 22 U/L (ref 10–35)
Albumin: 4.1 g/dL (ref 3.6–5.1)
Alkaline phosphatase (APISO): 48 U/L (ref 35–144)
BUN: 14 mg/dL (ref 7–25)
CO2: 27 mmol/L (ref 20–32)
Calcium: 9.7 mg/dL (ref 8.6–10.3)
Chloride: 104 mmol/L (ref 98–110)
Creat: 1.27 mg/dL (ref 0.70–1.35)
Globulin: 2.6 g/dL (calc) (ref 1.9–3.7)
Glucose, Bld: 89 mg/dL (ref 65–99)
Potassium: 4.3 mmol/L (ref 3.5–5.3)
Sodium: 137 mmol/L (ref 135–146)
Total Bilirubin: 0.8 mg/dL (ref 0.2–1.2)
Total Protein: 6.7 g/dL (ref 6.1–8.1)
eGFR: 63 mL/min/{1.73_m2} (ref >=60)

## 2021-12-05 LAB — LIPID PANEL
Cholesterol: 127 mg/dL (ref <200)
HDL: 38 mg/dL — ABNORMAL LOW (ref >=40)
LDL Cholesterol (Calc): 75 mg/dL (calc)
Non-HDL Cholesterol (Calc): 89 mg/dL (calc) (ref <130)
Total CHOL/HDL Ratio: 3.3 (calc) (ref <5.0)
Triglycerides: 49 mg/dL (ref <150)

## 2021-12-05 LAB — HEMOGLOBIN A1C
Hgb A1c MFr Bld: 5.4 % of total Hgb (ref <5.7)
Mean Plasma Glucose: 108 mg/dL
eAG (mmol/L): 6 mmol/L

## 2021-12-05 LAB — VITAMIN B12: Vitamin B-12: 1923 pg/mL — ABNORMAL HIGH (ref 200–1100)

## 2021-12-05 LAB — IRON, TOTAL/TOTAL IRON BINDING CAP
%SAT: 23 % (calc) (ref 20–48)
Iron: 86 ug/dL (ref 50–180)
TIBC: 375 mcg/dL (calc) (ref 250–425)

## 2021-12-05 LAB — INSULIN, RANDOM: Insulin: 7.1 u[IU]/mL

## 2021-12-05 LAB — TSH: TSH: 1.49 mIU/L (ref 0.40–4.50)

## 2021-12-05 LAB — VITAMIN D 25 HYDROXY (VIT D DEFICIENCY, FRACTURES): Vit D, 25-Hydroxy: 119 ng/mL — ABNORMAL HIGH (ref 30–100)

## 2021-12-05 LAB — MAGNESIUM: Magnesium: 2 mg/dL (ref 1.5–2.5)

## 2021-12-05 NOTE — Progress Notes (Signed)
<><><><><><><><><><><><><><><><><><><><><><><><><><><><><><><><><> ?<><><><><><><><><><><><><><><><><><><><><><><><><><><><><><><><><> ?-   Test results slightly outside the reference range are not unusual. ?If there is anything important, I will review this with you,  ?otherwise it is considered normal test values.  ?If you have further questions,  ?please do not hesitate to contact me at the office or via My Chart.  ?<><><><><><><><><><><><><><><><><><><><><><><><><><><><><><><><><> ?<><><><><><><><><><><><><><><><><><><><><><><><><><><><><><><><><> ? ?-  Vitamin B12 level is very high now - [place]   !  ? ?- So . . . . Dennis Bast may decrease your supplement down to take just 3 x /week ?<><><><><><><><><><><><><><><><><><><><><><><><><><><><><><><><><> ?<><><><><><><><><><><><><><><><><><><><><><><><><><><><><><><><><> ? ?-  Vitamin D = 119 is Elevated - too High  ! ? ?- So . . . . .  Recommend decrease your dose down from 50,000 units Daily to  ? ?Take 50,000 units every day, for example take on even days of month  ? ?<><><><><><><><><><><><><><><><><><><><><><><><><><><><><><><><><> ?<><><><><><><><><><><><><><><><><><><><><><><><><><><><><><><><><> ? ?-  Iron levels are OK  ? ?<><><><><><><><><><><><><><><><><><><><><><><><><><><><><><><><><> ?<><><><><><><><><><><><><><><><><><><><><><><><><><><><><><><><><> ? ?-  A1c is Normal - No Diabetes - Great ! ? ?<><><><><><><><><><><><><><><><><><><><><><><><><><><><><><><><><> ?<><><><><><><><><><><><><><><><><><><><><><><><><><><><><><><><><> ? ?-  All Else - CBC - Kidneys - U/A - Electrolytes - Liver - Magnesium & Thyroid   ? ?- all  Normal / OK ? ?<><><><><><><><><><><><><><><><><><><><><><><><><><><><><><><><><> ?<><><><><><><><><><><><><><><><><><><><><><><><><><><><><><><><><> ? ?- Keep up the [place] Work   ! ? ?<><><><><><><><><><><><><><><><><><><><><><><><><><><><><><><><><> ?<><><><><><><><><><><><><><><><><><><><><><><><><><><><><><><><><> ? ? ? ? ? ? ? ? ? ? ? ? ? ? ? ? ? ? ? ? ? ? ?

## 2021-12-09 ENCOUNTER — Ambulatory Visit: Payer: BC Managed Care – PPO

## 2021-12-11 ENCOUNTER — Ambulatory Visit (INDEPENDENT_AMBULATORY_CARE_PROVIDER_SITE_OTHER): Payer: BC Managed Care – PPO

## 2021-12-11 VITALS — BP 118/80 | HR 81 | Temp 97.9°F | Resp 16 | Ht 72.0 in | Wt 298.2 lb

## 2021-12-11 DIAGNOSIS — E349 Endocrine disorder, unspecified: Secondary | ICD-10-CM | POA: Diagnosis not present

## 2021-12-11 MED ORDER — TESTOSTERONE CYPIONATE 200 MG/ML IM SOLN
200.0000 mg | Freq: Once | INTRAMUSCULAR | Status: AC
  Administered 2021-12-11: 200 mg via INTRAMUSCULAR

## 2021-12-11 NOTE — Progress Notes (Signed)
Patient here for a Testosterone injection 200 mg/ml 1 ml IM injection in LEFT upper outer quadrant. Patient tolerated well and will return in 1 week for his next injection. Vitals taken at appointment today.  ?

## 2021-12-18 ENCOUNTER — Ambulatory Visit (INDEPENDENT_AMBULATORY_CARE_PROVIDER_SITE_OTHER): Payer: BC Managed Care – PPO

## 2021-12-18 VITALS — BP 110/78 | HR 78 | Temp 97.5°F | Wt 296.4 lb

## 2021-12-18 DIAGNOSIS — E349 Endocrine disorder, unspecified: Secondary | ICD-10-CM

## 2021-12-18 MED ORDER — TESTOSTERONE CYPIONATE 200 MG/ML IM SOLN
200.0000 mg | Freq: Once | INTRAMUSCULAR | Status: AC
  Administered 2021-12-18: 200 mg via INTRAMUSCULAR

## 2021-12-18 NOTE — Progress Notes (Signed)
Patient here for a Testosterone injection 200 mg/ml 1 ml IM injection in RIGHT upper outer quadrant. Patient tolerated well and will return in 1 week for his next injection. Vitals taken at appointment today. 

## 2021-12-25 ENCOUNTER — Ambulatory Visit (INDEPENDENT_AMBULATORY_CARE_PROVIDER_SITE_OTHER): Payer: BC Managed Care – PPO

## 2021-12-25 VITALS — BP 130/84 | HR 76 | Temp 97.5°F | Wt 292.8 lb

## 2021-12-25 DIAGNOSIS — E349 Endocrine disorder, unspecified: Secondary | ICD-10-CM | POA: Diagnosis not present

## 2021-12-25 MED ORDER — TESTOSTERONE CYPIONATE 200 MG/ML IM SOLN
200.0000 mg | Freq: Once | INTRAMUSCULAR | Status: AC
  Administered 2021-12-25: 200 mg via INTRAMUSCULAR

## 2021-12-25 NOTE — Progress Notes (Signed)
Patient here for a Testosterone injection 200 mg/ml 1 ml IM injection in LEFT upper outer quadrant. Patient tolerated well and will return in 1 week for his next injection. Vitals taken at appointment today.  ?

## 2021-12-30 ENCOUNTER — Telehealth: Payer: Self-pay | Admitting: Gastroenterology

## 2021-12-30 ENCOUNTER — Encounter: Payer: Self-pay | Admitting: Gastroenterology

## 2021-12-30 NOTE — Telephone Encounter (Signed)
Patient has been scheduled for EGD/colonoscopy on 02/14/22.  ?

## 2021-12-30 NOTE — Telephone Encounter (Signed)
Patient called to schedule EGD/colonoscopy. Patient recall for colonoscopy past due 2021. EGD is due 06/01/2022. Patient wants to know if he can schedule both at the same time. Please advise. ?

## 2021-12-30 NOTE — Telephone Encounter (Signed)
Yes this patient is due for EGD for Barrett's esophagus surveillance and colonoscopy for screening. ? ?(Note to [facility] scheduling nurse: Patient needs Dulcolax/GoLytely prep due to poor bowel preparation on 2020 colonoscopy) ? ?Please set up [facility] previsit to arrange these procedures with me. ? ?HD ?

## 2021-12-31 ENCOUNTER — Other Ambulatory Visit (HOSPITAL_COMMUNITY): Payer: Self-pay

## 2021-12-31 ENCOUNTER — Other Ambulatory Visit: Payer: Self-pay | Admitting: Internal Medicine

## 2021-12-31 ENCOUNTER — Other Ambulatory Visit: Payer: Self-pay | Admitting: Adult Health

## 2021-12-31 DIAGNOSIS — K219 Gastro-esophageal reflux disease without esophagitis: Secondary | ICD-10-CM

## 2021-12-31 MED ORDER — MOUNJARO 12.5 MG/0.5ML ~~LOC~~ SOAJ
12.5000 mg | SUBCUTANEOUS | 0 refills | Status: DC
  Filled 2021-12-31: qty 2, 28d supply, fill #0

## 2022-01-01 ENCOUNTER — Ambulatory Visit (INDEPENDENT_AMBULATORY_CARE_PROVIDER_SITE_OTHER): Payer: BC Managed Care – PPO

## 2022-01-01 VITALS — BP 130/78 | HR 81 | Temp 98.0°F | Resp 16 | Ht 72.0 in | Wt 288.6 lb

## 2022-01-01 DIAGNOSIS — E349 Endocrine disorder, unspecified: Secondary | ICD-10-CM

## 2022-01-01 MED ORDER — TESTOSTERONE CYPIONATE 200 MG/ML IM SOLN
200.0000 mg | Freq: Once | INTRAMUSCULAR | Status: AC
  Administered 2022-01-01: 200 mg via INTRAMUSCULAR

## 2022-01-01 NOTE — Progress Notes (Signed)
Patient here for a Testosterone injection 200 mg/ml 1 ml IM injection in RIGHT upper outer quadrant. Patient tolerated well and will return in 1 week for his next injection. Vitals taken at appointment today. 

## 2022-01-04 ENCOUNTER — Other Ambulatory Visit: Payer: Self-pay | Admitting: Internal Medicine

## 2022-01-04 DIAGNOSIS — I10 Essential (primary) hypertension: Secondary | ICD-10-CM

## 2022-01-08 ENCOUNTER — Ambulatory Visit (INDEPENDENT_AMBULATORY_CARE_PROVIDER_SITE_OTHER): Payer: BC Managed Care – PPO

## 2022-01-08 VITALS — BP 118/78 | HR 80 | Temp 97.7°F | Wt 291.0 lb

## 2022-01-08 DIAGNOSIS — E349 Endocrine disorder, unspecified: Secondary | ICD-10-CM

## 2022-01-08 MED ORDER — TESTOSTERONE CYPIONATE 200 MG/ML IM SOLN
200.0000 mg | Freq: Once | INTRAMUSCULAR | Status: AC
  Administered 2022-01-08: 200 mg via INTRAMUSCULAR

## 2022-01-08 NOTE — Progress Notes (Signed)
Patient here for a Testosterone injection 200 mg/ml 1 ml IM injection in LEFT upper outer quadrant. Patient tolerated well and will return in 1 week for his next injection. Vitals taken at appointment today ?

## 2022-01-15 ENCOUNTER — Ambulatory Visit (INDEPENDENT_AMBULATORY_CARE_PROVIDER_SITE_OTHER): Payer: BC Managed Care – PPO

## 2022-01-15 VITALS — BP 134/84 | HR 82 | Temp 97.5°F | Wt 287.0 lb

## 2022-01-15 DIAGNOSIS — E349 Endocrine disorder, unspecified: Secondary | ICD-10-CM | POA: Diagnosis not present

## 2022-01-15 MED ORDER — TESTOSTERONE CYPIONATE 200 MG/ML IM SOLN
200.0000 mg | Freq: Once | INTRAMUSCULAR | Status: AC
  Administered 2022-01-15: 200 mg via INTRAMUSCULAR

## 2022-01-15 NOTE — Progress Notes (Signed)
Patient here for a Testosterone injection 200 mg/ml 1 ml IM injection in RIGHT upper outer quadrant. Patient tolerated well and will return in 1 week for his next injection. Vitals taken at appointment today. 

## 2022-01-17 ENCOUNTER — Ambulatory Visit (AMBULATORY_SURGERY_CENTER): Payer: Self-pay | Admitting: *Deleted

## 2022-01-17 VITALS — Ht 72.0 in | Wt 287.6 lb

## 2022-01-17 DIAGNOSIS — Z1211 Encounter for screening for malignant neoplasm of colon: Secondary | ICD-10-CM

## 2022-01-17 DIAGNOSIS — Z8719 Personal history of other diseases of the digestive system: Secondary | ICD-10-CM

## 2022-01-17 MED ORDER — PEG 3350-KCL-NA BICARB-NACL 420 G PO SOLR: 4000.0000 mL | Freq: Once | ORAL | 0 refills | Status: AC

## 2022-01-17 NOTE — Progress Notes (Signed)
No egg or soy allergy known to patient  No issues known to pt with past sedation with any surgeries or procedures Patient denies ever being told they had issues or difficulty with intubation  No FH of Malignant Hyperthermia Pt is not on diet pills Pt is not on  home 02  Pt is not on blood thinners  Pt denies issues with constipation  No A fib or A flutter   Coupon to pt in PV today , Code to Pharmacy and  NO PA's for preps discussed with pt In PV today  Discussed with pt there will be an out-of-pocket cost for prep and that varies from $0 to 70 +  dollars - pt verbalized understanding  Pt instructed to use [employer].com or GoodRx for a price reduction on prep   PV completed over the phone. Pt verified name, DOB, address and insurance during PV today.  Pt mailed instruction packet with copy of consent form to read and not return, and instructions.  Pt encouraged to call with questions or issues.  If pt has My chart, procedure instructions sent via My Chart  Insurance confirmed with pt at PV today   

## 2022-01-22 ENCOUNTER — Ambulatory Visit (INDEPENDENT_AMBULATORY_CARE_PROVIDER_SITE_OTHER): Payer: BC Managed Care – PPO

## 2022-01-22 VITALS — BP 136/90 | HR 86 | Temp 97.3°F | Wt 290.0 lb

## 2022-01-22 DIAGNOSIS — E349 Endocrine disorder, unspecified: Secondary | ICD-10-CM

## 2022-01-22 MED ORDER — TESTOSTERONE CYPIONATE 200 MG/ML IM SOLN
200.0000 mg | Freq: Once | INTRAMUSCULAR | Status: AC
  Administered 2022-01-22: 200 mg via INTRAMUSCULAR

## 2022-01-22 NOTE — Progress Notes (Signed)
Patient here for a Testosterone injection 200 mg/ml 1 ml IM injection in LEFT upper outer quadrant. Patient tolerated well and will return in 1 week for his next injection. Vitals taken at appointment today

## 2022-01-29 ENCOUNTER — Ambulatory Visit (INDEPENDENT_AMBULATORY_CARE_PROVIDER_SITE_OTHER): Payer: BC Managed Care – PPO

## 2022-01-29 ENCOUNTER — Other Ambulatory Visit (HOSPITAL_COMMUNITY): Payer: Self-pay

## 2022-01-29 VITALS — Wt 288.0 lb

## 2022-01-29 DIAGNOSIS — E349 Endocrine disorder, unspecified: Secondary | ICD-10-CM | POA: Diagnosis not present

## 2022-01-29 MED ORDER — TESTOSTERONE CYPIONATE 200 MG/ML IM SOLN
200.0000 mg | Freq: Once | INTRAMUSCULAR | Status: AC
  Administered 2022-01-29: 100 mg via INTRAMUSCULAR

## 2022-01-29 NOTE — Progress Notes (Signed)
Patient here for a Testosterone injection 200 mg/ml 1 ml IM injection in RIGHT upper outer quadrant. Patient tolerated well and will return in 1 week for his next injection. Vitals taken at appointment today. 

## 2022-02-05 ENCOUNTER — Other Ambulatory Visit: Payer: Self-pay | Admitting: Internal Medicine

## 2022-02-05 ENCOUNTER — Ambulatory Visit (INDEPENDENT_AMBULATORY_CARE_PROVIDER_SITE_OTHER): Payer: BC Managed Care – PPO

## 2022-02-05 VITALS — BP 126/80 | HR 83 | Temp 97.2°F | Resp 16 | Ht 72.0 in | Wt 285.0 lb

## 2022-02-05 DIAGNOSIS — E349 Endocrine disorder, unspecified: Secondary | ICD-10-CM

## 2022-02-05 MED ORDER — TESTOSTERONE CYPIONATE 200 MG/ML IM SOLN: INTRAMUSCULAR | 1 refills | Status: DC

## 2022-02-05 MED ORDER — TESTOSTERONE CYPIONATE 200 MG/ML IM SOLN
200.0000 mg | Freq: Once | INTRAMUSCULAR | Status: AC
  Administered 2022-02-05: 200 mg via INTRAMUSCULAR

## 2022-02-05 NOTE — Progress Notes (Signed)
Patient here for a Testosterone injection 200 mg/ml 1 ml IM injection in LEFT upper outer quadrant. Patient tolerated well and will return in 1 week for his next injection. Vitals taken at appointment today

## 2022-02-10 ENCOUNTER — Other Ambulatory Visit (HOSPITAL_COMMUNITY): Payer: Self-pay

## 2022-02-10 ENCOUNTER — Other Ambulatory Visit: Payer: Self-pay | Admitting: Internal Medicine

## 2022-02-10 DIAGNOSIS — E349 Endocrine disorder, unspecified: Secondary | ICD-10-CM

## 2022-02-10 MED ORDER — TESTOSTERONE CYPIONATE 200 MG/ML IM SOLN
200.0000 mg | INTRAMUSCULAR | 1 refills | Status: DC
  Filled 2022-02-10: qty 10, 70d supply, fill #0

## 2022-02-11 ENCOUNTER — Other Ambulatory Visit (HOSPITAL_COMMUNITY): Payer: Self-pay

## 2022-02-12 ENCOUNTER — Ambulatory Visit (INDEPENDENT_AMBULATORY_CARE_PROVIDER_SITE_OTHER): Payer: BC Managed Care – PPO

## 2022-02-12 VITALS — BP 138/70 | HR 86 | Temp 97.9°F | Resp 16 | Ht 72.0 in | Wt 282.6 lb

## 2022-02-12 DIAGNOSIS — E349 Endocrine disorder, unspecified: Secondary | ICD-10-CM

## 2022-02-12 MED ORDER — TESTOSTERONE CYPIONATE 200 MG/ML IM SOLN
200.0000 mg | Freq: Once | INTRAMUSCULAR | Status: AC
  Administered 2022-02-12: 200 mg via INTRAMUSCULAR

## 2022-02-12 NOTE — Progress Notes (Signed)
Patient here for a Testosterone injection 200 mg/ml 1 ml IM injection in RIGHT upper outer quadrant. Patient tolerated well and will return in 1 week for his next injection. Vitals taken at appointment today. 

## 2022-02-14 ENCOUNTER — Encounter: Payer: Self-pay | Admitting: Gastroenterology

## 2022-02-14 ENCOUNTER — Ambulatory Visit (AMBULATORY_SURGERY_CENTER): Payer: BC Managed Care – PPO | Admitting: Gastroenterology

## 2022-02-14 VITALS — BP 128/86 | HR 83 | Temp 97.8°F | Resp 22 | Ht 72.0 in | Wt 287.6 lb

## 2022-02-14 DIAGNOSIS — K219 Gastro-esophageal reflux disease without esophagitis: Secondary | ICD-10-CM

## 2022-02-14 DIAGNOSIS — K635 Polyp of colon: Secondary | ICD-10-CM | POA: Diagnosis not present

## 2022-02-14 DIAGNOSIS — D123 Benign neoplasm of transverse colon: Secondary | ICD-10-CM

## 2022-02-14 DIAGNOSIS — Z8719 Personal history of other diseases of the digestive system: Secondary | ICD-10-CM | POA: Diagnosis present

## 2022-02-14 DIAGNOSIS — K2970 Gastritis, unspecified, without bleeding: Secondary | ICD-10-CM

## 2022-02-14 DIAGNOSIS — Z1211 Encounter for screening for malignant neoplasm of colon: Secondary | ICD-10-CM

## 2022-02-14 MED ORDER — SODIUM CHLORIDE 0.9 % IV SOLN: 500.0000 mL | Freq: Once | INTRAVENOUS | Status: DC

## 2022-02-14 NOTE — Op Note (Signed)
[facility] Patient Name: Christopher [name] Procedure Date: 02/14/2022 7:56 AM MRN: [id number] Endoscopist: Mallie Mussel L. Loletha Carrow , MD Age: 66 Referring MD:  Date of Birth: 03-Mar-1956 Gender: Male Account #: [id number] Procedure:                Colonoscopy Indications:              Screening for colorectal malignant neoplasm                           Poor prep Oct 2020 ( prior colonoscopy 2010) Medicines:                Monitored Anesthesia Care Procedure:                Pre-Anesthesia Assessment:                           - Prior to the procedure, a History and Physical                            was performed, and patient medications and                            allergies were reviewed. The patient's tolerance of                            previous anesthesia was also reviewed. The risks                            and benefits of the procedure and the sedation                            options and risks were discussed with the patient.                            All questions were answered, and informed consent                            was obtained. Prior Anticoagulants: The patient has                            taken no previous anticoagulant or antiplatelet                            agents. ASA Grade Assessment: III - A patient with                            severe systemic disease. After reviewing the risks                            and benefits, the patient was deemed in                            satisfactory condition to undergo the procedure.  After obtaining informed consent, the colonoscope                            was passed under direct vision. Throughout the                            procedure, the patient's blood pressure, pulse, and                            oxygen saturations were monitored continuously. The                            CF [id number] #[id number] was introduced through the anus                            and advanced to the  the cecum, identified by                            appendiceal orifice and ileocecal valve. The                            colonoscopy was somewhat difficult due to a                            redundant colon and significant looping. Successful                            completion of the procedure was aided by                            straightening and shortening the scope to obtain                            bowel loop reduction and lavage. The patient                            tolerated the procedure well. The quality of the                            bowel preparation was fair. The ileocecal valve,                            appendiceal orifice, and rectum were photographed.                            The bowel preparation used was 2 day Suprep/Miralax. Scope In: 8:34:32 AM Scope Out: 8:51:35 AM Scope Withdrawal Time: 0 hours 13 minutes 27 seconds  Total Procedure Duration: 0 hours 17 minutes 3 seconds  Findings:                 The perianal and digital rectal examinations were                            normal.  Two flat and semi-sessile polyps were found in the                            distal transverse colon. The polyps were 5 to 12 mm                            in size. These polyps were removed with a cold                            snare. Resection and retrieval were complete.                           Multiple diverticula were found in the left colon                            and right colon.                           The colon (entire examined portion) was redundant.                           Repeat examination of right colon under NBI                            performed.                           The exam was otherwise without abnormality on                            direct and retroflexion views. Complications:            No immediate complications. Estimated Blood Loss:     Estimated blood loss was minimal. Impression:               -  Preparation of the colon was fair.                           - Two 5 to 12 mm polyps in the distal transverse                            colon, removed with a cold snare. Resected and                            retrieved.                           - Diverticulosis in the left colon and in the right                            colon.                           - Redundant colon.                           -  The examination was otherwise normal on direct                            and retroflexion views. Recommendation:           - Patient has a contact number available for                            emergencies. The signs and symptoms of potential                            delayed complications were discussed with the                            patient. Return to normal activities tomorrow.                            Written discharge instructions were provided to the                            patient.                           - Resume previous diet.                           - Continue present medications.                           - Await pathology results.                           - Repeat colonoscopy in 1 year for surveillance.                            (fair prep ) - next prep : Miralax twice daily for                            3 days, then Ducolax/golytely                           - See the other procedure note for documentation of                            additional recommendations. Annaston Upham L. Loletha Carrow, MD 02/14/2022 9:14:38 AM This report has been signed electronically.

## 2022-02-14 NOTE — Progress Notes (Signed)
Vss nad trans to pacu °

## 2022-02-14 NOTE — Progress Notes (Signed)
Called to room to assist during endoscopic procedure.  Patient ID and intended procedure confirmed with present staff. Received instructions for my participation in the procedure from the performing physician.  

## 2022-02-14 NOTE — Progress Notes (Signed)
VS completed by DT.  Pt's states no medical or surgical changes since previsit or office visit.  

## 2022-02-14 NOTE — Progress Notes (Signed)
No problems noted in the recovery room. maw 

## 2022-02-14 NOTE — Patient Instructions (Addendum)
Handouts were given to your care partner on Barrett's Esophagus, Gastritis, Colon Polyps and Diverticulosis. You may resume your current medications today. Await biopsy results.  May take 1-3 weeks to receive pathology results. Please call if any questions or concerns.       YOU HAD AN ENDOSCOPIC PROCEDURE TODAY AT [facility]:   Refer to the procedure report that was given to you for any specific questions about what was found during the examination.  If the procedure report does not answer your questions, please call your gastroenterologist to clarify.  If you requested that your care partner not be given the details of your procedure findings, then the procedure report has been included in a sealed envelope for you to review at your convenience later.  YOU SHOULD EXPECT: Some feelings of bloating in the abdomen. Passage of more gas than usual.  Walking can help get rid of the air that was put into your GI tract during the procedure and reduce the bloating. If you had a lower endoscopy (such as a colonoscopy or flexible sigmoidoscopy) you may notice spotting of blood in your stool or on the toilet paper. If you underwent a bowel prep for your procedure, you may not have a normal bowel movement for a few days.  Please Note:  You might notice some irritation and congestion in your nose or some drainage.  This is from the oxygen used during your procedure.  There is no need for concern and it should clear up in a day or so.  SYMPTOMS TO REPORT IMMEDIATELY:  Following lower endoscopy (colonoscopy or flexible sigmoidoscopy):  Excessive amounts of blood in the stool  Significant tenderness or worsening of abdominal pains  Swelling of the abdomen that is new, acute  Fever of 100F or higher  Following upper endoscopy (EGD)  Vomiting of blood or coffee ground material  New chest pain or pain under the shoulder blades  Painful or persistently difficult swallowing  New shortness  of breath  Fever of 100F or higher  Black, tarry-looking stools  For urgent or emergent issues, a gastroenterologist can be reached at any hour by calling [phone]. Do not use MyChart messaging for urgent concerns.    DIET:  We do recommend a small meal at first, but then you may proceed to your regular diet.  Drink plenty of fluids but you should avoid alcoholic beverages for 24 hours.  ACTIVITY:  You should plan to take it easy for the rest of today and you should NOT DRIVE or use heavy machinery until tomorrow (because of the sedation medicines used during the test).    FOLLOW UP: Our staff will call the number listed on your records 24-72 hours following your procedure to check on you and address any questions or concerns that you may have regarding the information given to you following your procedure. If we do not reach you, we will leave a message.  We will attempt to reach you two times.  During this call, we will ask if you have developed any symptoms of COVID 19. If you develop any symptoms (ie: fever, flu-like symptoms, shortness of breath, cough etc.) before then, please call [phone].  If you test positive for Covid 19 in the 2 weeks post procedure, please call and report this information to [place].    If any biopsies were taken you will be contacted by phone or by letter within the next 1-3 weeks.  Please call us at [phone]  if you have not heard about the biopsies in 3 weeks.    SIGNATURES/CONFIDENTIALITY: You and/or your care partner have signed paperwork which will be entered into your electronic medical record.  These signatures attest to the fact that that the information above on your After Visit Summary has been reviewed and is understood.  Full responsibility of the confidentiality of this discharge information lies with you and/or your care-partner.

## 2022-02-14 NOTE — Op Note (Signed)
[place] Patient Name: Christopher [name] Procedure Date: 02/14/2022 7:55 AM MRN: [id number] Endoscopist: [place]. Loletha Carrow , MD Age: 66 Referring MD:  Date of Birth: 06-May-1956 Gender: Male Account #: [id number] Procedure:                Upper GI endoscopy Indications:              Surveillance for malignancy due to personal history                            of Barrett's esophagus                           Dx Feb 2019 , indefinite for dysplasia, no                            dysplasia Aug 2019. no dysplasia OCt 2020 Medicines:                Monitored Anesthesia Care Procedure:                Pre-Anesthesia Assessment:                           - Prior to the procedure, a History and Physical                            was performed, and patient medications and                            allergies were reviewed. The patient's tolerance of                            previous anesthesia was also reviewed. The risks                            and benefits of the procedure and the sedation                            options and risks were discussed with the patient.                            All questions were answered, and informed consent                            was obtained. Prior Anticoagulants: The patient has                            taken no previous anticoagulant or antiplatelet                            agents. ASA Grade Assessment: III - A patient with                            severe systemic disease. After reviewing the risks  and benefits, the patient was deemed in                            satisfactory condition to undergo the procedure.                           After obtaining informed consent, the endoscope was                            passed under direct vision. Throughout the                            procedure, the patient's blood pressure, pulse, and                            oxygen saturations were monitored continuously.  The                            GIF [id number] #[id number] was introduced through the                            mouth, and advanced to the second part of duodenum.                            The upper GI endoscopy was accomplished without                            difficulty. The patient tolerated the procedure                            well. Scope In: Scope Out: Findings:                 Two small tongues of salmon-colored mucosa were                            present. No other visible abnormalities were                            present. The maximum longitudinal extent of these                            esophageal mucosal changes was 1 cm in length.                            Biopsies were taken with a cold forceps for                            histology.                           Diffuse inflammation characterized by congestion                            (edema) and erythema was found in the gastric  fundus and in the gastric body. Several biopsies                            were obtained on the greater curvature of the                            gastric body, on the lesser curvature of the                            gastric body, on the greater curvature of the                            gastric antrum and on the lesser curvature of the                            gastric antrum with cold forceps for histology.                           The exam of the stomach was otherwise normal.                           The cardia and gastric fundus were normal on                            retroflexion.                           The examined duodenum was normal. Complications:            No immediate complications. Estimated Blood Loss:     Estimated blood loss was minimal. Impression:               - Salmon-colored mucosa. Biopsied.                           - Gastritis.                           - Normal examined duodenum.                           - Several biopsies  were obtained on the greater                            curvature of the gastric body, on the lesser                            curvature of the gastric body, on the greater                            curvature of the gastric antrum and on the lesser                            curvature of the gastric antrum. Recommendation:           - Patient  has a contact number available for                            emergencies. The signs and symptoms of potential                            delayed complications were discussed with the                            patient. Return to normal activities tomorrow.                            Written discharge instructions were provided to the                            patient.                           - Resume previous diet.                           - Continue present medications.                           - Await pathology results.                           - See the other procedure note for documentation of                            additional recommendations. [place] Luddy L. Loletha Carrow, MD 02/14/2022 9:19:15 AM This report has been signed electronically.

## 2022-02-14 NOTE — Progress Notes (Signed)
History and Physical:  This patient presents for endoscopic testing for: Encounter Diagnoses  Name Primary?   History of Barrett's esophagus Yes   Special screening for malignant neoplasms, colon     This patient presents for endoscopic evaluation today.  In October 2020 he had short segment Barrett's esophagus without dysplasia, originally diagnosed in 2019. Screening colonoscopy at that time also with poor preparation, thus repeat exam today. Patient denies chronic abdominal pain, rectal bleeding, constipation or diarrhea.   Patient is otherwise without complaints or active issues today.   Past Medical History: Past Medical History:  Diagnosis Date   Allergy    Anxiety    Asthma    as a child   Barrett's esophagus    Cataract    early stages left   Closed fracture of nasal septum 06/02/2016   GERD (gastroesophageal reflux disease)    Gout    never had a flare up of gout   H/O hiatal hernia    Hyperlipidemia    Hypertension    Prediabetes    Sleep apnea    does not use  cpap   Vitamin D deficiency      Past Surgical History: Past Surgical History:  Procedure Laterality Date   CLOSED REDUCTION NASAL FRACTURE N/A 06/10/2016   Procedure: CLOSED REDUCTION NASAL FRACTURE FOREHEAD SUTURE REMOVAL;  Surgeon: [name], DO;  Location: [facility];  Service: Plastics;  Laterality: N/A;   COLONOSCOPY     HEMORRHOID SURGERY  1980   Left knee Arthroscopy     Right knee arthroscopy     UPPER GASTROINTESTINAL ENDOSCOPY     last egd 12-7*2012    Allergies: No Known Allergies  Outpatient Meds: Current Outpatient Medications  Medication Sig Dispense Refill   allopurinol (ZYLOPRIM) 300 MG tablet Take  1 tablet  Daily  to Prevent Gout 90 tablet 3   ALPRAZolam (XANAX) 1 MG tablet Take 1/2 to 1 tablet 2 to 3 x / day  ONLY if needed for Panic Attack or Severe  Anxiety Attack & please try to limit to 5 days /week to avoid Addiction & Dementia ( Should last   6 weeks - til Dec 24) 60 tablet 0   atenolol (TENORMIN) 100 MG tablet TAKE 1 TABLET BY MOUTH  DAILY FOR BLOOD PRESSURE 90 tablet 3   cyclobenzaprine (FLEXERIL) 10 MG tablet Take  1/2 to 1 tablet  3 x /day   as needed for Muscle Spasm 90 tablet 0   esomeprazole (NEXIUM) 40 MG capsule TAKE 1 CAPSULE BY MOUTH  TWICE DAILY FOR GERD AND  BARRETT&apos;S ESOPHAGITIS 180 capsule 3   fenofibrate 160 MG tablet Take  1 tablet  Daily  for Triglycerides (Blood Fats) 90 tablet 3   fish oil-omega-3 fatty acids 1000 MG capsule Take 1 g by mouth daily.     magnesium oxide (MAG-OX) 400 MG tablet Take 400 mg by mouth 2 (two) times daily.      montelukast (SINGULAIR) 10 MG tablet TAKE 1 TABLET BY MOUTH  DAILY FOR ALLERGIES 90 tablet 1   Multiple Vitamin (MULTIVITAMIN WITH MINERALS) TABS tablet Take 1 tablet by mouth daily.     OVER THE COUNTER MEDICATION daily. Sea moss  with beet root-take 2 capsuls     potassium chloride (KLOR-CON) 10 MEQ tablet TAKE 1 TABLET BY MOUTH 3  TIMES DAILY 270 tablet 1   terazosin (HYTRIN) 2 MG capsule TAKE 1 CAPSULE BY MOUTH AT  BEDTIME FOR BP &amp;  PROSTATE 90 capsule 3   testosterone cypionate (DEPOTESTOSTERONE CYPIONATE) 200 MG/ML injection Inject 1 mL (200 mg total) into the muscle once a week. 10 mL 1   torsemide (DEMADEX) 20 MG tablet Take  1 tablet  2 x /day  am & pm  for severe leg swelling (Patient taking differently: 2 (two) times daily. Take  1 tablet  2 x /day  am & pm  for severe leg swelling) 180 tablet 3   valsartan (DIOVAN) 320 MG tablet Take   1 tablet Daily for  BP                                                            /                        TAKE                      BY                           MOUTH 90 tablet 3   vitamin C (ASCORBIC ACID) 500 MG tablet Take 500 mg by mouth daily.      Zinc 50 MG TABS Take by mouth daily.     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 90 tablet 1   aspirin EC 81 MG tablet Take 81 mg by mouth daily.      buPROPion (WELLBUTRIN XL) 300 MG 24 hr tablet Take  1 tablet  every Morning  for Mood, Focus & Concentration                                /       TAKE 1 TABLET BY MOUTH IN  THE MORNING FOR MOOD, 90 tablet 3   Cyanocobalamin (VITAMIN B 12 PO) Take 1 tablet by mouth daily.     EUCRISA 2 % OINT APPLY 1 APPLICATION TOPICALLY TWO TIMES A DAY (Patient taking differently: as needed.) 100 g 1   Flaxseed, Linseed, (FLAX SEEDS PO) Take by mouth.     tirzepatide (MOUNJARO) 12.5 MG/0.5ML Pen Inject 12.5 mg into the skin once a week. 6 mL 0   Vitamin D, Ergocalciferol, (DRISDOL) 1.25 MG (50000 UNIT) CAPS capsule TAKE 1 CAPSULE BY MOUTH  DAILY FOR SEVERE VIT D  DEFICIENCY (Patient taking differently: every other day. TAKE 1 CAPSULE BY MOUTH  DAILY FOR SEVERE VIT D  DEFICIENCY) 90 capsule 3   Current Facility-Administered Medications  Medication Dose Route Frequency Provider Last Rate Last Admin   0.9 %  sodium chloride infusion  500 mL Intravenous Once Nelida Meuse III, MD          ___________________________________________________________________ Objective   Exam:  BP 136/86   Pulse 69   Temp 97.8 F (36.6 C) (Temporal)   Resp 10   Ht 6' (1.829 m)   Wt 287 lb 9.6 oz (130.5 kg)   SpO2 95%   BMI 39.01 kg/m   CV: RRR without murmur, S1/S2 Resp: clear to auscultation bilaterally, normal RR and effort noted GI: soft, no tenderness, with active bowel  sounds.   Assessment: Encounter Diagnoses  Name Primary?   History of Barrett's esophagus Yes   Special screening for malignant neoplasms, colon      Plan: Colonoscopy EGD  The benefits and risks of the planned procedure were described in detail with the patient or (when appropriate) their health care proxy.  Risks were outlined as including, but not limited to, bleeding, infection, perforation, adverse medication reaction leading to cardiac or pulmonary decompensation, pancreatitis (if ERCP).  The limitation of incomplete mucosal  visualization was also discussed.  No guarantees or warranties were given.    The patient is appropriate for an endoscopic procedure in the ambulatory setting.   - Wilfrid Lund, MD

## 2022-02-17 ENCOUNTER — Telehealth: Payer: Self-pay

## 2022-02-17 NOTE — Telephone Encounter (Signed)
  Follow up Call-     02/14/2022    7:26 AM 06/06/2019    7:26 AM  Call back number  Post procedure Call Back phone  # [phone]  Permission to leave phone message Yes Yes     Patient questions:  Do you have a fever, pain , or abdominal swelling? No. Pain Score  0 *  Have you tolerated food without any problems? Yes.    Have you been able to return to your normal activities? Yes.    Do you have any questions about your discharge instructions: Diet   No. Medications  No. Follow up visit  No.  Do you have questions or concerns about your Care? No.  Actions: * If pain score is 4 or above: No action needed, pain <4.

## 2022-02-18 ENCOUNTER — Encounter: Payer: Self-pay | Admitting: Gastroenterology

## 2022-02-19 ENCOUNTER — Ambulatory Visit (INDEPENDENT_AMBULATORY_CARE_PROVIDER_SITE_OTHER): Payer: BC Managed Care – PPO

## 2022-02-19 VITALS — Wt 283.0 lb

## 2022-02-19 DIAGNOSIS — E349 Endocrine disorder, unspecified: Secondary | ICD-10-CM

## 2022-02-19 MED ORDER — TESTOSTERONE CYPIONATE 200 MG/ML IM SOLN
200.0000 mg | Freq: Once | INTRAMUSCULAR | Status: AC
  Administered 2022-02-19: 100 mg via INTRAMUSCULAR

## 2022-02-19 NOTE — Progress Notes (Signed)
Patient here for a Testosterone injection 200 mg/ml 1 ml IM injection in LEFT upper outer quadrant. Patient tolerated well and will return in 1 week for his next injection. Vitals taken at appointment today

## 2022-02-24 ENCOUNTER — Ambulatory Visit: Payer: BC Managed Care – PPO

## 2022-02-26 ENCOUNTER — Ambulatory Visit (INDEPENDENT_AMBULATORY_CARE_PROVIDER_SITE_OTHER): Payer: BC Managed Care – PPO

## 2022-02-26 VITALS — BP 126/72 | HR 74 | Temp 97.9°F | Resp 16 | Ht 72.0 in | Wt 281.2 lb

## 2022-02-26 DIAGNOSIS — E349 Endocrine disorder, unspecified: Secondary | ICD-10-CM

## 2022-02-26 MED ORDER — TESTOSTERONE CYPIONATE 200 MG/ML IM SOLN
200.0000 mg | Freq: Once | INTRAMUSCULAR | Status: AC
  Administered 2022-02-26: 200 mg via INTRAMUSCULAR

## 2022-02-26 NOTE — Progress Notes (Signed)
Patient here for a Testosterone injection 200 mg/ml 1 ml IM injection in RIGHT upper outer quadrant. Patient tolerated well and will return in 1 week for his next injection. Vitals taken at appointment today.

## 2022-03-05 ENCOUNTER — Ambulatory Visit (INDEPENDENT_AMBULATORY_CARE_PROVIDER_SITE_OTHER): Payer: BC Managed Care – PPO

## 2022-03-05 VITALS — BP 120/80 | HR 76 | Temp 97.5°F | Wt 281.8 lb

## 2022-03-05 DIAGNOSIS — E349 Endocrine disorder, unspecified: Secondary | ICD-10-CM | POA: Diagnosis not present

## 2022-03-05 MED ORDER — TESTOSTERONE CYPIONATE 200 MG/ML IM SOLN
200.0000 mg | Freq: Once | INTRAMUSCULAR | Status: AC
  Administered 2022-03-05: 100 mg via INTRAMUSCULAR

## 2022-03-05 NOTE — Progress Notes (Signed)
Patient here for a Testosterone injection '100mg'$ /8m IM injection in LEFT upper outer quadrant. Patient tolerated well and will return in 1 week for his next injection. Vitals taken at appointment today.

## 2022-03-11 ENCOUNTER — Ambulatory Visit (INDEPENDENT_AMBULATORY_CARE_PROVIDER_SITE_OTHER): Payer: BC Managed Care – PPO | Admitting: Nurse Practitioner

## 2022-03-11 ENCOUNTER — Encounter: Payer: Self-pay | Admitting: Nurse Practitioner

## 2022-03-11 VITALS — BP 110/78 | HR 77 | Temp 97.7°F | Wt 284.0 lb

## 2022-03-11 DIAGNOSIS — E349 Endocrine disorder, unspecified: Secondary | ICD-10-CM | POA: Diagnosis not present

## 2022-03-11 DIAGNOSIS — I10 Essential (primary) hypertension: Secondary | ICD-10-CM

## 2022-03-11 DIAGNOSIS — R7309 Other abnormal glucose: Secondary | ICD-10-CM

## 2022-03-11 DIAGNOSIS — I700 Atherosclerosis of aorta: Secondary | ICD-10-CM | POA: Diagnosis not present

## 2022-03-11 DIAGNOSIS — E782 Mixed hyperlipidemia: Secondary | ICD-10-CM

## 2022-03-11 DIAGNOSIS — I708 Atherosclerosis of other arteries: Secondary | ICD-10-CM

## 2022-03-11 DIAGNOSIS — R7303 Prediabetes: Secondary | ICD-10-CM | POA: Diagnosis not present

## 2022-03-11 DIAGNOSIS — K219 Gastro-esophageal reflux disease without esophagitis: Secondary | ICD-10-CM

## 2022-03-11 DIAGNOSIS — Z79899 Other long term (current) drug therapy: Secondary | ICD-10-CM

## 2022-03-11 DIAGNOSIS — E559 Vitamin D deficiency, unspecified: Secondary | ICD-10-CM

## 2022-03-11 MED ORDER — TESTOSTERONE CYPIONATE 200 MG/ML IM SOLN
200.0000 mg | Freq: Once | INTRAMUSCULAR | Status: AC
  Administered 2022-03-11: 200 mg via INTRAMUSCULAR

## 2022-03-11 MED ORDER — TIRZEPATIDE 15 MG/0.5ML ~~LOC~~ SOAJ: 15.0000 mg | SUBCUTANEOUS | 2 refills | Status: DC

## 2022-03-11 NOTE — Progress Notes (Signed)
FOLLOW UP  1. Testosterone deficiency Injection administered today. Patient tolerated well.  - testosterone cypionate (DEPOTESTOSTERONE CYPIONATE) injection 200 mg  2. Aorto-iliac atherosclerosis ([place]) by CT Scan on 01/08/2018 Continues to have BLE edema. Working on weight loss. Continue medications. Discussed lifestyle modifications. Recommended diet heavy in fruits and veggies, omega 3's. Decrease consumption of animal meats, cheeses, and dairy products. Remain active and exercise as tolerated. Continue to monitor.   3. Essential hypertension Discussed DASH (Dietary Approaches to Stop Hypertension) DASH diet is lower in sodium than a typical American diet. Cut back on foods that are high in saturated fat, cholesterol, and trans fats. Eat more whole-grain foods, fish, poultry, and nuts Remain active and exercise as tolerated daily.  Monitor BP at home-Call if greater than 130/80.    4. Prediabetes  - tirzepatide (MOUNJARO) 15 MG/0.5ML Pen; Inject 15 mg into the skin once a week.  Dispense: 2 mL; Refill: 2  5. Gastroesophageal reflux disease, unspecified whether esophagitis present Controlled No recent flares Avoid triggers. Avoid laying down after eating.  6. Abnormal glucose Education: Reviewed 'ABCs' of diabetes management  A1C (<7) Blood pressure (<130/80) Cholesterol (LDL <70) Continue Eye Exam yearly  Continue Dental Exam Q6 mo Discussed dietary recommendations Discussed Physical Activity recommendations Foot exam UTD  - tirzepatide (MOUNJARO) 15 MG/0.5ML Pen; Inject 15 mg into the skin once a week.  Dispense: 2 mL; Refill: 2  7. Hyperlipidemia, mixed Controlled Discussed lifestyle modifications. Recommended diet heavy in fruits and veggies, omega 3's. Decrease consumption of animal meats, cheeses, and dairy products. Remain active and exercise as tolerated. Continue to monitor.   8. Vitamin D deficiency Elevated during last blood work  11/2021 Continue taking QOD. Continue to monitor Will obtain updated levels NOV  9. Medication management All medications discussed and reviewed in full. All questions and concerns regarding medications addressed.    - tirzepatide (MOUNJARO) 15 MG/0.5ML Pen; Inject 15 mg into the skin once a week.  Dispense: 2 mL; Refill: 2   Continue diet and meds as discussed. Further disposition pending results of labs. Discussed med's effects and SE's.   Over 30 minutes of exam, counseling, chart review, and critical decision making was performed.   Future Appointments  Date Time Provider [place]  03/18/2022  3:00 PM [facility] NURSE [facility] None  03/25/2022  3:00 PM [facility] NURSE [facility] None  04/01/2022  3:00 PM [facility] NURSE [facility] None  04/08/2022  3:00 PM [facility] NURSE [facility] None  04/15/2022  3:00 PM [facility] NURSE [facility] None  06/11/2022 10:30 AM Unk Pinto, MD [facility] None  12/11/2022 11:00 AM Unk Pinto, MD [facility] None    ----------------------------------------------------------------------------------------------------------------------  HPI 66 y.o. male  presents for 3 month follow up on hypertension, cholesterol, diabetes, weight and vitamin D deficiency.   Overall he reports doing well.  He has no additional complaints in clinic today.  He continues with medication compliance.  BMI is Body mass index is 38.52 kg/m., he has been working on diet and exercise. Wt Readings from Last 3 Encounters:  03/11/22 284 lb (128.8 kg)  03/05/22 281 lb 12.8 oz (127.8 kg)  02/26/22 281 lb 3.2 oz (127.6 kg)    His blood pressure has been controlled at home, today their BP is BP: 110/78  He does workout. He denies chest pain, shortness of breath, dizziness.   He is not on cholesterol medication. His cholesterol is at goal. The cholesterol last visit was:   Lab Results  Component Value Date   CHOL  127 12/04/2021   HDL 38 (L) 12/04/2021    LDLCALC 75 12/04/2021   TRIG 49 12/04/2021   CHOLHDL 3.3 12/04/2021    He has been working on diet and exercise for prediabetes, and denies polydipsia and polyuria. Last A1C in the office was:  Lab Results  Component Value Date   HGBA1C 5.4 12/04/2021   Patient is on Vitamin D supplement.   Lab Results  Component Value Date   VD25OH 119 (H) 12/04/2021        Current Medications:  Current Outpatient Medications on File Prior to Visit  Medication Sig   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   allopurinol (ZYLOPRIM) 300 MG tablet Take  1 tablet  Daily  to Prevent Gout   ALPRAZolam (XANAX) 1 MG tablet Take 1/2 to 1 tablet 2 to 3 x / day  ONLY if needed for Panic Attack or Severe  Anxiety Attack & please try to limit to 5 days /week to avoid Addiction & Dementia ( Should last  6 weeks - til Dec 24)   aspirin EC 81 MG tablet Take 81 mg by mouth daily.   atenolol (TENORMIN) 100 MG tablet TAKE 1 TABLET BY MOUTH  DAILY FOR BLOOD PRESSURE   buPROPion (WELLBUTRIN XL) 300 MG 24 hr tablet Take  1 tablet  every Morning  for Mood, Focus & Concentration                                /       TAKE 1 TABLET BY MOUTH IN  THE MORNING FOR MOOD,   Cyanocobalamin (VITAMIN B 12 PO) Take 1 tablet by mouth daily.   cyclobenzaprine (FLEXERIL) 10 MG tablet Take  1/2 to 1 tablet  3 x /day   as needed for Muscle Spasm   esomeprazole (NEXIUM) 40 MG capsule TAKE 1 CAPSULE BY MOUTH  TWICE DAILY FOR GERD AND  BARRETT&apos;S ESOPHAGITIS   EUCRISA 2 % OINT APPLY 1 APPLICATION TOPICALLY TWO TIMES A DAY (Patient taking differently: as needed.)   fenofibrate 160 MG tablet Take  1 tablet  Daily  for Triglycerides (Blood Fats)   fish oil-omega-3 fatty acids 1000 MG capsule Take 1 g by mouth daily.   Flaxseed, Linseed, (FLAX SEEDS PO) Take by mouth.   magnesium oxide (MAG-OX) 400 MG tablet Take 400 mg by mouth 2 (two) times daily.    montelukast (SINGULAIR) 10 MG tablet TAKE 1 TABLET BY  MOUTH  DAILY FOR ALLERGIES   Multiple Vitamin (MULTIVITAMIN WITH MINERALS) TABS tablet Take 1 tablet by mouth daily.   OVER THE COUNTER MEDICATION daily. Sea moss  with beet root-take 2 capsuls   potassium chloride (KLOR-CON) 10 MEQ tablet TAKE 1 TABLET BY MOUTH 3  TIMES DAILY   terazosin (HYTRIN) 2 MG capsule TAKE 1 CAPSULE BY MOUTH AT  BEDTIME FOR BP &amp; PROSTATE   testosterone cypionate (DEPOTESTOSTERONE CYPIONATE) 200 MG/ML injection Inject 1 mL (200 mg total) into the muscle once a week.   tirzepatide (MOUNJARO) 12.5 MG/0.5ML Pen Inject 12.5 mg into the skin once a week.   torsemide (DEMADEX) 20 MG tablet Take  1 tablet  2 x /day  am & pm  for severe leg swelling (Patient taking differently: 2 (two) times daily. Take  1 tablet  2 x /day  am & pm  for severe leg swelling)   valsartan (  DIOVAN) 320 MG tablet Take   1 tablet Daily for  BP                                                            /                        TAKE                      BY                           MOUTH   vitamin C (ASCORBIC ACID) 500 MG tablet Take 500 mg by mouth daily.    Vitamin D, Ergocalciferol, (DRISDOL) 1.25 MG (50000 UNIT) CAPS capsule TAKE 1 CAPSULE BY MOUTH  DAILY FOR SEVERE VIT D  DEFICIENCY (Patient taking differently: every other day. TAKE 1 CAPSULE BY MOUTH  DAILY FOR SEVERE VIT D  DEFICIENCY)   Zinc 50 MG TABS Take by mouth daily.   No current facility-administered medications on file prior to visit.     Allergies: No Known Allergies   Medical History:  Past Medical History:  Diagnosis Date   Allergy    Anxiety    Asthma    as a child   Barrett's esophagus    Cataract    early stages left   Closed fracture of nasal septum 06/02/2016   GERD (gastroesophageal reflux disease)    Gout    never had a flare up of gout   H/O hiatal hernia    Hyperlipidemia    Hypertension    Prediabetes    Sleep apnea    does not use  cpap   Vitamin D deficiency    Family history- Reviewed and  unchanged Social history- Reviewed and unchanged   Review of Systems:  ROS    Physical Exam: BP 110/78   Pulse 77   Temp 97.7 F (36.5 C)   Wt 284 lb (128.8 kg)   SpO2 96%   BMI 38.52 kg/m  Wt Readings from Last 3 Encounters:  03/11/22 284 lb (128.8 kg)  03/05/22 281 lb 12.8 oz (127.8 kg)  02/26/22 281 lb 3.2 oz (127.6 kg)   General Appearance: Well nourished, in no apparent distress. Eyes: PERRLA, EOMs, conjunctiva no swelling or erythema Sinuses: No Frontal/maxillary tenderness ENT/Mouth: Ext aud canals clear, TMs without erythema, bulging. No erythema, swelling, or exudate on post pharynx.  Tonsils not swollen or erythematous. Hearing normal.  Neck: Supple, thyroid normal.  Respiratory: Respiratory effort normal, BS equal bilaterally without rales, rhonchi, wheezing or stridor.  Cardio: RRR with no MRGs. Brisk peripheral pulses without edema.  Abdomen: Soft, + BS.  Non tender, no guarding, rebound, hernias, masses. Lymphatics: Non tender without lymphadenopathy.  Musculoskeletal: Full ROM, 5/5 strength, Normal gait Skin: Warm, dry without rashes, lesions, ecchymosis.  Neuro: Cranial nerves intact. No cerebellar symptoms.  Psych: Awake and oriented X 3, normal affect, Insight and Judgment appropriate.    Darrol Jump, NP 11:29 AM [facility]

## 2022-03-11 NOTE — Patient Instructions (Signed)

## 2022-03-11 NOTE — Progress Notes (Signed)
Patient here for a Testosterone injection '100mg'$ /85m IM injection in RIGHT upper outer quadrant. Patient tolerated well and will return in 1 week for his next injection. Vitals taken at appointment today.

## 2022-03-18 ENCOUNTER — Ambulatory Visit: Payer: BC Managed Care – PPO

## 2022-03-19 ENCOUNTER — Ambulatory Visit (INDEPENDENT_AMBULATORY_CARE_PROVIDER_SITE_OTHER): Payer: BC Managed Care – PPO

## 2022-03-19 DIAGNOSIS — E349 Endocrine disorder, unspecified: Secondary | ICD-10-CM | POA: Diagnosis not present

## 2022-03-19 MED ORDER — TESTOSTERONE CYPIONATE 200 MG/ML IM SOLN
200.0000 mg | Freq: Once | INTRAMUSCULAR | Status: AC
  Administered 2022-03-19: 200 mg via INTRAMUSCULAR

## 2022-03-19 NOTE — Progress Notes (Signed)
Patient here for a Testosterone injection '100mg'$ /27m IM injection in LEFT upper outer quadrant. Patient tolerated well and will return in 1 week for his next injection. Vitals taken at appointment today.

## 2022-03-25 ENCOUNTER — Ambulatory Visit (INDEPENDENT_AMBULATORY_CARE_PROVIDER_SITE_OTHER): Payer: BC Managed Care – PPO

## 2022-03-25 VITALS — BP 110/80 | HR 77 | Temp 97.7°F | Wt 274.2 lb

## 2022-03-25 DIAGNOSIS — E349 Endocrine disorder, unspecified: Secondary | ICD-10-CM | POA: Diagnosis not present

## 2022-03-25 MED ORDER — TESTOSTERONE CYPIONATE 200 MG/ML IM SOLN
200.0000 mg | Freq: Once | INTRAMUSCULAR | Status: AC
  Administered 2022-03-25: 200 mg via INTRAMUSCULAR

## 2022-03-25 NOTE — Progress Notes (Signed)
Patient here for a Testosterone injection '100mg'$ /11m IM injection in RIGHT upper outer quadrant. Patient tolerated well and will return in 1 week for his next injection. Vitals taken at appointment today.

## 2022-04-01 ENCOUNTER — Ambulatory Visit (INDEPENDENT_AMBULATORY_CARE_PROVIDER_SITE_OTHER): Payer: BC Managed Care – PPO

## 2022-04-01 VITALS — BP 118/76 | HR 72 | Temp 98.0°F | Resp 16 | Ht 72.0 in | Wt 274.0 lb

## 2022-04-01 DIAGNOSIS — E349 Endocrine disorder, unspecified: Secondary | ICD-10-CM

## 2022-04-01 MED ORDER — TESTOSTERONE CYPIONATE 200 MG/ML IM SOLN
200.0000 mg | Freq: Once | INTRAMUSCULAR | Status: AC
  Administered 2022-04-01: 200 mg via INTRAMUSCULAR

## 2022-04-01 NOTE — Progress Notes (Signed)
Patient here for a Testosterone injection '100mg'$ /65m IM injection in LEFT upper outer quadrant. Patient tolerated well and will return in 1 week for his next injection. Vitals taken at appointment today.

## 2022-04-08 ENCOUNTER — Ambulatory Visit (INDEPENDENT_AMBULATORY_CARE_PROVIDER_SITE_OTHER): Payer: BC Managed Care – PPO

## 2022-04-08 VITALS — BP 136/88 | HR 72 | Temp 97.8°F | Resp 16 | Ht 72.0 in | Wt 275.6 lb

## 2022-04-08 DIAGNOSIS — E349 Endocrine disorder, unspecified: Secondary | ICD-10-CM | POA: Diagnosis not present

## 2022-04-08 MED ORDER — TESTOSTERONE CYPIONATE 200 MG/ML IM SOLN
200.0000 mg | Freq: Once | INTRAMUSCULAR | Status: AC
  Administered 2022-04-08: 200 mg via INTRAMUSCULAR

## 2022-04-08 NOTE — Progress Notes (Signed)
Patient here for a Testosterone injection '100mg'$ /60m IM injection in RIGHT upper outer quadrant. Patient tolerated well and will return in 1 week for his next injection. Vitals taken at appointment today.

## 2022-04-13 ENCOUNTER — Other Ambulatory Visit: Payer: Self-pay | Admitting: Internal Medicine

## 2022-04-13 DIAGNOSIS — F3341 Major depressive disorder, recurrent, in partial remission: Secondary | ICD-10-CM

## 2022-04-14 ENCOUNTER — Other Ambulatory Visit: Payer: Self-pay

## 2022-04-14 ENCOUNTER — Other Ambulatory Visit: Payer: Self-pay | Admitting: Internal Medicine

## 2022-04-14 DIAGNOSIS — I10 Essential (primary) hypertension: Secondary | ICD-10-CM

## 2022-04-14 DIAGNOSIS — Z79899 Other long term (current) drug therapy: Secondary | ICD-10-CM

## 2022-04-14 DIAGNOSIS — R7309 Other abnormal glucose: Secondary | ICD-10-CM

## 2022-04-14 DIAGNOSIS — N138 Other obstructive and reflux uropathy: Secondary | ICD-10-CM

## 2022-04-14 DIAGNOSIS — R7303 Prediabetes: Secondary | ICD-10-CM

## 2022-04-14 DIAGNOSIS — J302 Other seasonal allergic rhinitis: Secondary | ICD-10-CM

## 2022-04-14 MED ORDER — ATENOLOL 100 MG PO TABS: ORAL_TABLET | ORAL | 3 refills | Status: DC

## 2022-04-14 MED ORDER — TERAZOSIN HCL 2 MG PO CAPS: ORAL_CAPSULE | ORAL | 3 refills | Status: DC

## 2022-04-14 MED ORDER — MONTELUKAST SODIUM 10 MG PO TABS: ORAL_TABLET | ORAL | 3 refills | Status: AC

## 2022-04-14 MED ORDER — MONTELUKAST SODIUM 10 MG PO TABS: ORAL_TABLET | ORAL | 3 refills | Status: DC

## 2022-04-14 MED ORDER — TIRZEPATIDE 15 MG/0.5ML ~~LOC~~ SOAJ: 15.0000 mg | SUBCUTANEOUS | 2 refills | Status: DC

## 2022-04-15 ENCOUNTER — Other Ambulatory Visit: Payer: Self-pay | Admitting: Internal Medicine

## 2022-04-15 ENCOUNTER — Ambulatory Visit (INDEPENDENT_AMBULATORY_CARE_PROVIDER_SITE_OTHER): Payer: BC Managed Care – PPO

## 2022-04-15 VITALS — BP 118/78 | HR 81 | Temp 97.9°F | Resp 16 | Ht 72.0 in | Wt 262.8 lb

## 2022-04-15 DIAGNOSIS — E1122 Type 2 diabetes mellitus with diabetic chronic kidney disease: Secondary | ICD-10-CM

## 2022-04-15 DIAGNOSIS — E349 Endocrine disorder, unspecified: Secondary | ICD-10-CM

## 2022-04-15 MED ORDER — TRULICITY 3 MG/0.5ML ~~LOC~~ SOAJ: SUBCUTANEOUS | 0 refills | Status: DC

## 2022-04-15 MED ORDER — TESTOSTERONE CYPIONATE 200 MG/ML IM SOLN
200.0000 mg | Freq: Once | INTRAMUSCULAR | Status: AC
  Administered 2022-04-15: 200 mg via INTRAMUSCULAR

## 2022-04-15 NOTE — Progress Notes (Signed)
Patient here for a Testosterone injection '100mg'$ /31m IM injection in LEFTupper outer quadrant. Patient tolerated well and will return in 1 week for his next injection. Vitals taken at appointment today.

## 2022-04-22 ENCOUNTER — Other Ambulatory Visit: Payer: Self-pay | Admitting: Internal Medicine

## 2022-04-22 ENCOUNTER — Ambulatory Visit (INDEPENDENT_AMBULATORY_CARE_PROVIDER_SITE_OTHER): Payer: BC Managed Care – PPO

## 2022-04-22 VITALS — BP 118/78 | HR 82 | Temp 98.0°F | Resp 16 | Ht 72.0 in | Wt 272.4 lb

## 2022-04-22 DIAGNOSIS — E1122 Type 2 diabetes mellitus with diabetic chronic kidney disease: Secondary | ICD-10-CM

## 2022-04-22 DIAGNOSIS — E349 Endocrine disorder, unspecified: Secondary | ICD-10-CM

## 2022-04-22 MED ORDER — TESTOSTERONE CYPIONATE 200 MG/ML IM SOLN
200.0000 mg | Freq: Once | INTRAMUSCULAR | Status: AC
  Administered 2022-04-22: 200 mg via INTRAMUSCULAR

## 2022-04-22 MED ORDER — TRULICITY 4.5 MG/0.5ML ~~LOC~~ SOAJ: SUBCUTANEOUS | 0 refills | Status: DC

## 2022-04-22 NOTE — Progress Notes (Signed)
Patient here for a Testosterone injection '100mg'$ /72m IM injection in RIGHT upper outer quadrant. Patient tolerated well and will return in 1 week for his next injection. Vitals taken at appointment today.

## 2022-04-29 ENCOUNTER — Ambulatory Visit (INDEPENDENT_AMBULATORY_CARE_PROVIDER_SITE_OTHER): Payer: BC Managed Care – PPO

## 2022-04-29 VITALS — BP 126/70 | HR 86 | Temp 98.0°F | Resp 18 | Ht 72.0 in | Wt 263.6 lb

## 2022-04-29 DIAGNOSIS — E349 Endocrine disorder, unspecified: Secondary | ICD-10-CM | POA: Diagnosis not present

## 2022-04-29 MED ORDER — TESTOSTERONE CYPIONATE 200 MG/ML IM SOLN
200.0000 mg | Freq: Once | INTRAMUSCULAR | Status: AC
  Administered 2022-04-29: 200 mg via INTRAMUSCULAR

## 2022-04-29 NOTE — Progress Notes (Signed)
Patient here for a Testosterone injection '100mg'$ /13m IM injection in LEFT upper outer quadrant. Patient tolerated well and will return in 1 week for his next injection. Vitals taken at appointment today.

## 2022-05-03 ENCOUNTER — Other Ambulatory Visit: Payer: Self-pay | Admitting: Internal Medicine

## 2022-05-03 DIAGNOSIS — R450 Nervousness: Secondary | ICD-10-CM

## 2022-05-06 ENCOUNTER — Ambulatory Visit (INDEPENDENT_AMBULATORY_CARE_PROVIDER_SITE_OTHER): Payer: BC Managed Care – PPO

## 2022-05-06 VITALS — BP 130/80 | HR 77 | Temp 97.7°F | Ht 72.0 in | Wt 267.0 lb

## 2022-05-06 DIAGNOSIS — E349 Endocrine disorder, unspecified: Secondary | ICD-10-CM

## 2022-05-06 MED ORDER — TESTOSTERONE CYPIONATE 200 MG/ML IM SOLN
200.0000 mg | Freq: Once | INTRAMUSCULAR | Status: AC
  Administered 2022-05-06: 100 mg via INTRAMUSCULAR

## 2022-05-06 NOTE — Progress Notes (Signed)
Patient here for a Testosterone injection '100mg'$ /36m IM injection in RIGHT upper outer quadrant. Patient tolerated well and will return in 1 week for his next injection. Vitals taken at appointment today

## 2022-05-13 ENCOUNTER — Ambulatory Visit (INDEPENDENT_AMBULATORY_CARE_PROVIDER_SITE_OTHER): Payer: BC Managed Care – PPO

## 2022-05-13 VITALS — BP 132/90 | HR 77 | Temp 97.9°F | Ht 72.0 in | Wt 261.0 lb

## 2022-05-13 DIAGNOSIS — E349 Endocrine disorder, unspecified: Secondary | ICD-10-CM

## 2022-05-13 MED ORDER — TESTOSTERONE CYPIONATE 200 MG/ML IM SOLN
200.0000 mg | Freq: Once | INTRAMUSCULAR | Status: AC
  Administered 2022-05-13: 200 mg via INTRAMUSCULAR

## 2022-05-13 NOTE — Progress Notes (Signed)
Patient here for a Testosterone injection '100mg'$ /50m IM injection in LEFT upper outer quadrant. Patient tolerated well and will return in 1 week for his next injection. Vitals taken at appointment today

## 2022-05-17 ENCOUNTER — Other Ambulatory Visit: Payer: Self-pay | Admitting: Internal Medicine

## 2022-05-17 MED ORDER — TRULICITY 4.5 MG/0.5ML ~~LOC~~ SOAJ
SUBCUTANEOUS | 3 refills | Status: DC
Start: 2022-05-17 — End: 2023-05-31

## 2022-05-20 ENCOUNTER — Ambulatory Visit (INDEPENDENT_AMBULATORY_CARE_PROVIDER_SITE_OTHER): Payer: BC Managed Care – PPO

## 2022-05-20 VITALS — BP 126/84 | HR 70 | Temp 97.9°F | Ht 72.0 in | Wt 257.0 lb

## 2022-05-20 DIAGNOSIS — E349 Endocrine disorder, unspecified: Secondary | ICD-10-CM | POA: Diagnosis not present

## 2022-05-20 MED ORDER — TESTOSTERONE CYPIONATE 200 MG/ML IM SOLN
200.0000 mg | Freq: Once | INTRAMUSCULAR | Status: AC
  Administered 2022-05-20: 100 mg via INTRAMUSCULAR

## 2022-05-20 NOTE — Progress Notes (Signed)
Patient here for a Testosterone injection '100mg'$ /22m IM injection in RIGHT upper outer quadrant. Patient tolerated well and will return in 1 week for his next injection. Vitals taken at appointment today

## 2022-05-27 ENCOUNTER — Ambulatory Visit: Payer: BC Managed Care – PPO

## 2022-05-28 ENCOUNTER — Ambulatory Visit (INDEPENDENT_AMBULATORY_CARE_PROVIDER_SITE_OTHER): Payer: BC Managed Care – PPO

## 2022-05-28 VITALS — BP 118/78 | HR 78 | Temp 97.3°F | Ht 72.0 in | Wt 255.0 lb

## 2022-05-28 DIAGNOSIS — E349 Endocrine disorder, unspecified: Secondary | ICD-10-CM

## 2022-05-28 MED ORDER — TESTOSTERONE CYPIONATE 200 MG/ML IM SOLN
200.0000 mg | Freq: Once | INTRAMUSCULAR | Status: AC
  Administered 2022-05-28: 200 mg via INTRAMUSCULAR

## 2022-05-28 NOTE — Progress Notes (Signed)
Patient here for a Testosterone injection '100mg'$ /65m IM injection in LEFT upper outer quadrant. Patient tolerated well and will return in 1 week for his next injection. Vitals taken at appointment today

## 2022-05-30 ENCOUNTER — Other Ambulatory Visit: Payer: Self-pay | Admitting: Internal Medicine

## 2022-05-30 DIAGNOSIS — E1122 Type 2 diabetes mellitus with diabetic chronic kidney disease: Secondary | ICD-10-CM

## 2022-06-03 ENCOUNTER — Ambulatory Visit (INDEPENDENT_AMBULATORY_CARE_PROVIDER_SITE_OTHER): Payer: BC Managed Care – PPO

## 2022-06-03 VITALS — BP 102/72 | HR 77 | Temp 97.5°F | Ht 72.0 in | Wt 260.6 lb

## 2022-06-03 DIAGNOSIS — E349 Endocrine disorder, unspecified: Secondary | ICD-10-CM

## 2022-06-03 MED ORDER — TESTOSTERONE CYPIONATE 200 MG/ML IM SOLN
200.0000 mg | Freq: Once | INTRAMUSCULAR | Status: AC
  Administered 2022-06-03: 200 mg via INTRAMUSCULAR

## 2022-06-03 NOTE — Progress Notes (Signed)
Patient here for a Testosterone injection '100mg'$ /19m IM injection in RIGHT upper outer quadrant. Patient tolerated well and will return in 1 week for his next injection. Vitals taken at appointment today

## 2022-06-10 ENCOUNTER — Encounter: Payer: Self-pay | Admitting: Internal Medicine

## 2022-06-10 NOTE — Patient Instructions (Signed)

## 2022-06-10 NOTE — Progress Notes (Unsigned)
Future Appointments  Date Time Provider [place]  06/12/2021  9:30 AM Unk Pinto, MD [facility] None  12/04/2021  9:00 AM Unk Pinto, MD [facility] None    History of Present Illness:       This very nice 66 y.o. [name] presents for 6 month follow up with HTN, HLD, Pre-Diabetes and Vitamin D Deficiency. Abd CT scan in 2019 showed Aorto-Iliac Atherosclerosis.       Patient is treated for HTN (2005) & BP has been controlled at home. Today's BP is at goal  -  122/80. Patient has had no complaints of any cardiac type chest pain, palpitations, dyspnea Vertell Limber /PND, dizziness, claudication   or dependent edema.       Hyperlipidemia is controlled with diet & meds. Patient denies myalgias or other med SE's. Last Lipids were  at goal:  Lab Results  Component Value Date   CHOL 127 12/04/2021   HDL 38 (L) 12/04/2021   LDLCALC 75 12/04/2021   TRIG 49 12/04/2021   CHOLHDL 3.3 12/04/2021     Also, the patient has history of  Morbid Obesity ( BMI  35.53 ) and  admits compulsive overeating  with hx/o glucose intolerance /prediabetes  (A1c 5.9% /2010 and 6.0% 12 /2021 ) . Patient is felt to have diet controlled diabetes. Patient has had no symptoms of reactive hypoglycemia, diabetic polys, paresthesias or visual blurring.  Last A1c was not at goal:  Lab Results  Component Value Date   HGBA1C 5.4 12/04/2021   Wt Readings from Last 3 Encounters:  06/11/22 262 lb (118.8 kg)  06/03/22 260 lb 9.6 oz (118.2 kg)  05/28/22 255 lb (115.7 kg)                                                      Further, the patient also has history of Vitamin D Deficiency and supplements vitamin D without any suspected side-effects. Last vitamin D was elevated & dose 50K was tapered to qod  :  Lab Results  Component Value Date   VD25OH 119 (H) 12/04/2021      Current Outpatient Medications on File Prior to Visit  Medication Sig   acyclovir 800 MG tablet TAKE 1 TABLET BY MOUTH  DAILY FOR  HSV   Allopurinol  300 MG tablet Take  1 tablet  Daily  to Prevent Gout   ALPRAZolam 1 MG tablet Take 1/2 - 1 tablet 2 - 3 x /day ONLY if needed f   aspirin EC 81 MG tablet Take  daily.   atenolol 100 MG tablet Take  1 tablet  Daily    buPROPion  XL 300 MG  Take  1 tablet  Daily for Mood, Focus & concentration   VITAMIN B 12 P Take 1 tablet daily.   cyclobenzaprine 10 MG tablet Take  1/2 to 1 tablet  3 x /day   as needed for Muscle Spasm   TRULICITY 4.5 VO/3.5KK  Inject 4.5 mg into skin every 7 days  for Diabetes    esomeprazole 40 MG capsule TAKE 1 CAPSULE  TWICE DAILY   EUCRISA 2 % OINT  taking differently - as needed   fenofibrate 160 MG tablet Take  1 tablet  Daily    fish oil-omega-3 1000 MG capsule Take 1 g  daily.   FLAX SEEDS  Take daily   MAG-OX 400 MG tablet Take 400 mg by 2  times daily.    montelukast 10 MG tablet Take  1 tablet  Daily     Multiple Vitamin  Take 1 tablet daily.   Sea moss  with beet root Take 2 capsules daily.    potassium chloride 10 MEQ  TAKE 1 TABLET \ 3  TIMES DAILY   terazosin  2 MG capsule Take  1 capsule at Bedtime     testosterone cypio  200 MG/ML injec Inject 1 mL into muscle once a week.   MOUNJARO   15 MG/0.5ML Pen Inject 15 mg into the skin once a week.   torsemide  20 MG tablet Take  1 tablet  2 x /day  am & pm  for severe leg swelling   valsartan 320 MG tablet Take   1 tablet Daily   vitamin C  500 MG tablet Take  daily.    Vitamin D 50,000 UNIT taking every other day   Zinc 50 MG TABS Take daily.    No Known Allergies   PMHx:   Past Medical History:  Diagnosis Date   Allergy    Anxiety    Asthma    as a child   Barrett's esophagus    Closed fracture of nasal septum 06/02/2016   GERD (gastroesophageal reflux disease)    Gout    never had a flare up of gout   H/O hiatal hernia    Hyperlipidemia    Hypertension    Prediabetes    Sleep apnea    does not use  cpap   Vitamin D deficiency      Immunization History  Administered  Date(s) Administered   Influenza Inj Mdck Quad  07/02/2017, 06/15/2018   Influenza Split 06/28/2014, 06/28/2015   Influenza,inj,Quad  06/19/2019   Influenza,inj,quad 08/11/2016   Influenza 09/01/2012   PFIZER SARS-COV-2 Vacc 10/17/2019, 11/07/2019, 09/02/2020   PPD Test 11/01/2018, 11/21/2019, 11/28/2020   Pneumococcal-23 09/01/1998   Tdap 08/01/2013   Zoster Recombinat (Shingrix) 06/19/2019, 08/23/2019     Past Surgical History:  Procedure Laterality Date   CLOSED REDUCTION NASAL FRACTURE N/A 06/10/2016   Procedure: CLOSED REDUCTION NASAL FRACTURE FOREHEAD SUTURE REMOVAL;  Surgeon: [name], DO;  Location: [facility];  Service: Plastics;  Laterality: N/A;   COLONOSCOPY     HEMORRHOID SURGERY  1980   Left knee Arthroscopy     Right knee arthroscopy     UPPER GASTROINTESTINAL ENDOSCOPY     last EGD  08-08-2011    FHx:    Reviewed / unchanged  SHx:    Reviewed / unchanged   Systems Review:  Constitutional: Denies fever, chills, wt changes, headaches, insomnia, fatigue, night sweats, change in appetite. Eyes: Denies redness, blurred vision, diplopia, discharge, itchy, watery eyes.  ENT: Denies discharge, congestion, post nasal drip, epistaxis, sore throat, earache, hearing loss, dental pain, tinnitus, vertigo, sinus pain, snoring.  CV: Denies chest pain, palpitations, irregular heartbeat, syncope, dyspnea, diaphoresis, orthopnea, PND, claudication or edema. Respiratory: denies cough, dyspnea, DOE, pleurisy, hoarseness, laryngitis, wheezing.  Gastrointestinal: Denies dysphagia, odynophagia, heartburn, reflux, water brash, abdominal pain or cramps, nausea, vomiting, bloating, diarrhea, constipation, hematemesis, melena, hematochezia  or hemorrhoids. Genitourinary: Denies dysuria, frequency, urgency, nocturia, hesitancy, discharge, hematuria or flank pain. Musculoskeletal: Denies arthralgias, myalgias, stiffness, jt. swelling, pain, limping or strain/sprain.   Skin: Denies pruritus, rash, hives, warts, acne, eczema or change in skin lesion(s). Neuro: No weakness, tremor, incoordination, spasms,  paresthesia or pain. Psychiatric: Denies confusion, memory loss or sensory loss. Endo: Denies change in weight, skin or hair change.  Heme/Lymph: No excessive bleeding, bruising or enlarged lymph nodes.  Physical Exam  BP 122/80   Pulse 73   Temp (!) 97.5 F (36.4 C)   Resp 16   Ht 6' (1.829 m)   Wt 262 lb (118.8 kg)   SpO2 97%   BMI 35.53 kg/m   Appears  over nourished and in no distress.  Eyes: PERRLA, EOMs, conjunctiva no swelling or erythema. Sinuses: No frontal/maxillary tenderness ENT/Mouth: EAC's clear, TM's nl w/o erythema, bulging. Nares clear w/o erythema, swelling, exudates. Oropharynx clear without erythema or exudates. Oral hygiene is good. Tongue normal, non obstructing. Hearing intact.  Neck: Supple. Thyroid not palpable. Car 2+/2+ without bruits, nodes or JVD. Chest: Respirations nl with BS clear & equal w/o rales, rhonchi, wheezing or stridor.  Cor: Heart sounds normal w/ regular rate and rhythm without sig. murmurs, gallops, clicks or rubs. Peripheral pulses normal and equal  without edema.  Abdomen: Soft & bowel sounds normal. Non-tender w/o guarding, rebound, hernias, masses or organomegaly.  Lymphatics: Unremarkable.  Musculoskeletal: Full ROM all peripheral extremities, joint stability, 5/5 strength and normal gait.  Skin: Warm, dry without exposed rashes, lesions or ecchymosis apparent.  Neuro: Cranial nerves intact, reflexes equal bilaterally. Sensory-motor testing grossly intact. Tendon reflexes grossly intact.  Pysch: Alert & oriented x 3.  Insight and judgement nl & appropriate. No ideations.  Assessment and Plan:  1. Essential hypertension  - Continue medication, monitor blood pressure at home.  - Continue DASH diet.  Reminder to go to the ER if any CP,  SOB, nausea, dizziness, severe HA, changes vision/speech.    - CBC with Differential/Platelet - COMPLETE METABOLIC PANEL WITH GFR - Magnesium - TSH  2. Hyperlipidemia associated with type 2 diabetes mellitus ([place])  - Continue diet/meds, exercise,& lifestyle modifications.  - Continue monitor periodic cholesterol/liver & renal functions    - Lipid panel - TSH  3. Type 2 diabetes mellitus with stage 2 chronic kidney  disease, without long-term current use of insulin (HCC)  - Continue diet, exercise  - Lifestyle modifications.  - Monitor appropriate labs   - Hemoglobin A1c - Insulin, random  4. Vitamin D deficiency  - Continue supplementation   - VITAMIN D 25 Hydroxy   5. Gout  - Uric acid  6. Morbid obesity with BMI of 45.0-49.9, adult (HCC)  - TSH  7. Testosterone deficiency  - Testosterone  8. Medication management  - CBC with Differential/Platelet - COMPLETE METABOLIC PANEL WITH GFR - Magnesium - Lipid panel - TSH - Hemoglobin A1c - Insulin, random - VITAMIN D 25 Hydroxy  - Testosterone - Uric acid  9. Flu vaccine need  - Flu Vaccine QUAD 6+ mos PF IM (Fluarix Quad PF)  10. Primary insomnia  - traZODone (DESYREL) 150 MG tablet;  Take 1/2 to 1 tablet 1 to 2 hours before Bedtime as needed for Sleep    (Dx:  f 51.01)  Dispense: 90 tablet; Refill: 1         Discussed  regular exercise, BP monitoring, weight control to achieve/maintain BMI less than 25 and discussed med and SE's. Recommended labs to assess and monitor clinical status with further disposition pending results of labs.  I discussed the assessment and treatment plan with the patient. The patient was provided an opportunity to ask questions and all were answered. The patient agreed with the plan and  demonstrated an understanding of the instructions.  I provided over 30 minutes of exam, counseling, chart review and  complex critical decision making.        The patient was advised to call back or seek an in-person evaluation if the symptoms worsen or  if the condition fails to improve as anticipated.   Kirtland Bouchard, MD

## 2022-06-11 ENCOUNTER — Encounter: Payer: Self-pay | Admitting: Internal Medicine

## 2022-06-11 ENCOUNTER — Ambulatory Visit (INDEPENDENT_AMBULATORY_CARE_PROVIDER_SITE_OTHER): Payer: BC Managed Care – PPO | Admitting: Internal Medicine

## 2022-06-11 VITALS — BP 122/80 | HR 73 | Temp 97.5°F | Resp 16 | Ht 72.0 in | Wt 262.0 lb

## 2022-06-11 DIAGNOSIS — Z79899 Other long term (current) drug therapy: Secondary | ICD-10-CM

## 2022-06-11 DIAGNOSIS — E349 Endocrine disorder, unspecified: Secondary | ICD-10-CM | POA: Diagnosis not present

## 2022-06-11 DIAGNOSIS — Z23 Encounter for immunization: Secondary | ICD-10-CM | POA: Diagnosis not present

## 2022-06-11 DIAGNOSIS — E559 Vitamin D deficiency, unspecified: Secondary | ICD-10-CM

## 2022-06-11 DIAGNOSIS — M109 Gout, unspecified: Secondary | ICD-10-CM

## 2022-06-11 DIAGNOSIS — N182 Chronic kidney disease, stage 2 (mild): Secondary | ICD-10-CM

## 2022-06-11 DIAGNOSIS — E1169 Type 2 diabetes mellitus with other specified complication: Secondary | ICD-10-CM

## 2022-06-11 DIAGNOSIS — E785 Hyperlipidemia, unspecified: Secondary | ICD-10-CM

## 2022-06-11 DIAGNOSIS — E1122 Type 2 diabetes mellitus with diabetic chronic kidney disease: Secondary | ICD-10-CM | POA: Diagnosis not present

## 2022-06-11 DIAGNOSIS — I10 Essential (primary) hypertension: Secondary | ICD-10-CM

## 2022-06-11 DIAGNOSIS — Z6841 Body Mass Index (BMI) 40.0 and over, adult: Secondary | ICD-10-CM

## 2022-06-11 DIAGNOSIS — F5101 Primary insomnia: Secondary | ICD-10-CM

## 2022-06-11 MED ORDER — TRAZODONE HCL 150 MG PO TABS: ORAL_TABLET | ORAL | 1 refills | Status: DC

## 2022-06-11 MED ORDER — TESTOSTERONE CYPIONATE 200 MG/ML IM SOLN
200.0000 mg | Freq: Once | INTRAMUSCULAR | Status: AC
  Administered 2022-06-11: 200 mg via INTRAMUSCULAR

## 2022-06-11 NOTE — Progress Notes (Signed)
Patient here for a Testosterone injection '100mg'$ /49m IM injection in LEFT upper outer quadrant. Patient tolerated well and will return in 1 week for his next injection. Vitals taken at appointment today

## 2022-06-12 ENCOUNTER — Other Ambulatory Visit: Payer: Self-pay | Admitting: Internal Medicine

## 2022-06-12 DIAGNOSIS — E559 Vitamin D deficiency, unspecified: Secondary | ICD-10-CM

## 2022-06-12 LAB — COMPLETE METABOLIC PANEL WITH GFR
AG Ratio: 2 (calc) (ref 1.0–2.5)
ALT: 22 U/L (ref 9–46)
AST: 30 U/L (ref 10–35)
Albumin: 4.7 g/dL (ref 3.6–5.1)
Alkaline phosphatase (APISO): 51 U/L (ref 35–144)
BUN/Creatinine Ratio: 9 (calc) (ref 6–22)
BUN: 14 mg/dL (ref 7–25)
CO2: 31 mmol/L (ref 20–32)
Calcium: 9.8 mg/dL (ref 8.6–10.3)
Chloride: 99 mmol/L (ref 98–110)
Creat: 1.51 mg/dL — ABNORMAL HIGH (ref 0.70–1.35)
Globulin: 2.3 g/dL (calc) (ref 1.9–3.7)
Glucose, Bld: 82 mg/dL (ref 65–99)
Potassium: 4 mmol/L (ref 3.5–5.3)
Sodium: 139 mmol/L (ref 135–146)
Total Bilirubin: 1.1 mg/dL (ref 0.2–1.2)
Total Protein: 7 g/dL (ref 6.1–8.1)
eGFR: 51 mL/min/{1.73_m2} — ABNORMAL LOW (ref >=60)

## 2022-06-12 LAB — LIPID PANEL
Cholesterol: 158 mg/dL (ref <200)
HDL: 52 mg/dL (ref >=40)
LDL Cholesterol (Calc): 92 mg/dL (calc)
Non-HDL Cholesterol (Calc): 106 mg/dL (calc) (ref <130)
Total CHOL/HDL Ratio: 3 (calc) (ref <5.0)
Triglycerides: 49 mg/dL (ref <150)

## 2022-06-12 LAB — CBC WITH DIFFERENTIAL/PLATELET
Absolute Monocytes: 862 cells/uL (ref 200–950)
Basophils Absolute: 23 cells/uL (ref 0–200)
Basophils Relative: 0.3 %
Eosinophils Absolute: 92 cells/uL (ref 15–500)
Eosinophils Relative: 1.2 %
HCT: 48.5 % (ref 38.5–50.0)
Hemoglobin: 16.8 g/dL (ref 13.2–17.1)
Lymphs Abs: 2187 cells/uL (ref 850–3900)
MCH: 32.7 pg (ref 27.0–33.0)
MCHC: 34.6 g/dL (ref 32.0–36.0)
MCV: 94.5 fL (ref 80.0–100.0)
MPV: 11.2 fL (ref 7.5–12.5)
Monocytes Relative: 11.2 %
Neutro Abs: 4535 cells/uL (ref 1500–7800)
Neutrophils Relative %: 58.9 %
Platelets: 125 10*3/uL — ABNORMAL LOW (ref 140–400)
RBC: 5.13 10*6/uL (ref 4.20–5.80)
RDW: 13.1 % (ref 11.0–15.0)
Total Lymphocyte: 28.4 %
WBC: 7.7 10*3/uL (ref 3.8–10.8)

## 2022-06-12 LAB — VITAMIN D 25 HYDROXY (VIT D DEFICIENCY, FRACTURES): Vit D, 25-Hydroxy: 122 ng/mL — ABNORMAL HIGH (ref 30–100)

## 2022-06-12 LAB — TESTOSTERONE: Testosterone: 1761 ng/dL — ABNORMAL HIGH (ref 250–827)

## 2022-06-12 LAB — INSULIN, RANDOM: Insulin: 4.9 u[IU]/mL

## 2022-06-12 LAB — MAGNESIUM: Magnesium: 2.1 mg/dL (ref 1.5–2.5)

## 2022-06-12 LAB — HEMOGLOBIN A1C
Hgb A1c MFr Bld: 5.1 % of total Hgb (ref <5.7)
Mean Plasma Glucose: 100 mg/dL
eAG (mmol/L): 5.5 mmol/L

## 2022-06-12 LAB — TSH: TSH: 1.51 mIU/L (ref 0.40–4.50)

## 2022-06-12 LAB — URIC ACID: Uric Acid, Serum: 8.6 mg/dL — ABNORMAL HIGH (ref 4.0–8.0)

## 2022-06-12 MED ORDER — VITAMIN D3 125 MCG (5000 UT) PO CAPS: ORAL_CAPSULE | ORAL | 99 refills | Status: AC

## 2022-06-12 NOTE — Progress Notes (Signed)
<><><><><><><><><><><><><><><><><><><><><><><><><><><><><><><><><> <><><><><><><><><><><><><><><><><><><><><><><><><><><><><><><><><> -   Test results slightly outside the reference range are not unusual. If there is anything important, I will review this with you,  otherwise it is considered normal test values.  If you have further questions,  please do not hesitate to contact me at the office or via My Chart.  <><><><><><><><><><><><><><><><><><><><><><><><><><><><><><><><><> <><><><><><><><><><><><><><><><><><><><><><><><><><><><><><><><><>  -  Vitamin D = 122  is TOO HIGH !   Stop your Vitamin D 50,000 unit capsules forever !  - After you have been off of the 50,000 unit Vit D for 1 week                                     Please purchase OTC Vitamin d 5,000 unit capsules & take                                     2 capsules of Vit D  5,000 units = 10,000 units  Every Day   <><><><><><><><><><><><><><><><><><><><><><><><><><><><><><><><><>  - Testosterone level high - Please be sure only taking 1 ml every 7 days  <><><><><><><><><><><><><><><><><><><><><><><><><><><><><><><><><>  - Uric Acid / Gout test is very high  !                                      Please be sure that you are taking your Allopurinol EVERY day  ! <><><><><><><><><><><><><><><><><><><><><><><><><><><><><><><><><>  - Total Chol = 158  & LDL Chol = 92 - Both  Great   - Very low risk for Heart Attack  / Stroke  <><><><><><><><><><><><><><><><><><><><><><><><><><><><><><><><><>  -   A1c - Normal - No Diabetes  - Great  !  <><><><><><><><><><><><><><><><><><><><><><><><><><><><><><><><><>  - All Else - CBC - Kidneys - Electrolytes - Liver - Magnesium & Thyroid    - all  Normal / OK  <><><><><><><><><><><><><><><><><><><><><><><><><><><><><><><><><> <><><><><><><><><><><><><><><><><><><><><><><><><><><><><><><><><>

## 2022-06-14 ENCOUNTER — Encounter: Payer: Self-pay | Admitting: Internal Medicine

## 2022-06-16 DIAGNOSIS — Z23 Encounter for immunization: Secondary | ICD-10-CM | POA: Diagnosis not present

## 2022-06-17 ENCOUNTER — Ambulatory Visit: Payer: BC Managed Care – PPO

## 2022-06-17 ENCOUNTER — Encounter: Payer: Self-pay | Admitting: Internal Medicine

## 2022-06-18 ENCOUNTER — Ambulatory Visit (INDEPENDENT_AMBULATORY_CARE_PROVIDER_SITE_OTHER): Payer: BC Managed Care – PPO

## 2022-06-18 VITALS — BP 120/80 | HR 77 | Temp 97.9°F | Ht 72.0 in | Wt 255.0 lb

## 2022-06-18 DIAGNOSIS — E349 Endocrine disorder, unspecified: Secondary | ICD-10-CM | POA: Diagnosis not present

## 2022-06-18 MED ORDER — TESTOSTERONE CYPIONATE 200 MG/ML IM SOLN: 200.0000 mg | Freq: Once | INTRAMUSCULAR | Status: DC

## 2022-06-18 NOTE — Progress Notes (Signed)
Patient here for a Testosterone injection '100mg'$ /41m IM injection in LEFT upper outer quadrant. Patient tolerated well and will return in 1 week for his next injection. Vitals taken at appointment today

## 2022-06-24 ENCOUNTER — Ambulatory Visit: Payer: BC Managed Care – PPO

## 2022-06-25 ENCOUNTER — Ambulatory Visit (INDEPENDENT_AMBULATORY_CARE_PROVIDER_SITE_OTHER): Payer: BC Managed Care – PPO

## 2022-06-25 ENCOUNTER — Other Ambulatory Visit: Payer: Self-pay | Admitting: Internal Medicine

## 2022-06-25 DIAGNOSIS — Z79899 Other long term (current) drug therapy: Secondary | ICD-10-CM

## 2022-06-25 DIAGNOSIS — E782 Mixed hyperlipidemia: Secondary | ICD-10-CM

## 2022-06-25 DIAGNOSIS — E349 Endocrine disorder, unspecified: Secondary | ICD-10-CM | POA: Diagnosis not present

## 2022-06-25 DIAGNOSIS — M109 Gout, unspecified: Secondary | ICD-10-CM

## 2022-06-25 MED ORDER — TESTOSTERONE CYPIONATE 200 MG/ML IM SOLN
200.0000 mg | Freq: Once | INTRAMUSCULAR | Status: AC
  Administered 2022-06-25: 200 mg via INTRAMUSCULAR

## 2022-06-25 NOTE — Progress Notes (Signed)
Patient here for a Testosterone injection '100mg'$ /70m IM injection in RIGHT upper outer quadrant. Patient tolerated well and will return in 1 week for his next injection. Vitals taken at appointment today

## 2022-06-27 ENCOUNTER — Other Ambulatory Visit: Payer: Self-pay | Admitting: Internal Medicine

## 2022-06-27 DIAGNOSIS — E349 Endocrine disorder, unspecified: Secondary | ICD-10-CM

## 2022-06-27 MED ORDER — TESTOSTERONE CYPIONATE 200 MG/ML IM SOLN: 200.0000 mg | INTRAMUSCULAR | 1 refills | Status: DC

## 2022-07-01 ENCOUNTER — Ambulatory Visit (INDEPENDENT_AMBULATORY_CARE_PROVIDER_SITE_OTHER): Payer: BC Managed Care – PPO

## 2022-07-01 VITALS — BP 120/80 | HR 80 | Temp 98.7°F | Ht 72.0 in | Wt 257.0 lb

## 2022-07-01 DIAGNOSIS — E349 Endocrine disorder, unspecified: Secondary | ICD-10-CM

## 2022-07-01 MED ORDER — TESTOSTERONE CYPIONATE 200 MG/ML IM SOLN
200.0000 mg | Freq: Once | INTRAMUSCULAR | Status: AC
  Administered 2022-07-01: 100 mg via INTRAMUSCULAR

## 2022-07-01 NOTE — Progress Notes (Signed)
Patient here for a Testosterone injection '100mg'$ /23m IM injection in LEFT upper outer quadrant. Patient tolerated well and will return in 1 week for his next injection. Blood pressure 120/80, pulse 80, temperature 98.7 F (37.1 C), height 6' (1.829 m), weight 257 lb (116.6 kg), SpO2 97 %.

## 2022-07-08 ENCOUNTER — Ambulatory Visit (INDEPENDENT_AMBULATORY_CARE_PROVIDER_SITE_OTHER): Payer: BC Managed Care – PPO

## 2022-07-08 VITALS — BP 124/84 | HR 76 | Temp 97.7°F | Ht 72.0 in | Wt 259.8 lb

## 2022-07-08 DIAGNOSIS — E349 Endocrine disorder, unspecified: Secondary | ICD-10-CM | POA: Diagnosis not present

## 2022-07-08 MED ORDER — TESTOSTERONE CYPIONATE 200 MG/ML IM SOLN
200.0000 mg | Freq: Once | INTRAMUSCULAR | Status: AC
  Administered 2022-07-08: 200 mg via INTRAMUSCULAR

## 2022-07-08 NOTE — Progress Notes (Signed)
Patient here for a Testosterone injection 100mg/1ml IM injection in RIGHT upper outer quadrant. Patient tolerated well and will return in 1 week for his next injection    

## 2022-07-15 ENCOUNTER — Ambulatory Visit: Payer: BC Managed Care – PPO

## 2022-07-16 ENCOUNTER — Ambulatory Visit (INDEPENDENT_AMBULATORY_CARE_PROVIDER_SITE_OTHER): Payer: BC Managed Care – PPO

## 2022-07-16 VITALS — BP 128/80 | HR 74 | Temp 97.9°F | Ht 72.0 in | Wt 262.0 lb

## 2022-07-16 DIAGNOSIS — E349 Endocrine disorder, unspecified: Secondary | ICD-10-CM

## 2022-07-16 MED ORDER — TESTOSTERONE CYPIONATE 200 MG/ML IM SOLN
200.0000 mg | Freq: Once | INTRAMUSCULAR | Status: AC
  Administered 2022-07-16: 200 mg via INTRAMUSCULAR

## 2022-07-16 NOTE — Progress Notes (Signed)
Patient here for a Testosterone injection 100mg/1ml IM injection in LEFT upper outer quadrant. Patient tolerated well and will return in 1 week for his next injection 

## 2022-07-22 ENCOUNTER — Ambulatory Visit (INDEPENDENT_AMBULATORY_CARE_PROVIDER_SITE_OTHER): Payer: BC Managed Care – PPO

## 2022-07-22 VITALS — BP 130/80 | HR 78 | Temp 98.3°F | Resp 16 | Ht 72.0 in | Wt 253.2 lb

## 2022-07-22 DIAGNOSIS — E349 Endocrine disorder, unspecified: Secondary | ICD-10-CM

## 2022-07-22 MED ORDER — TESTOSTERONE CYPIONATE 200 MG/ML IM SOLN
200.0000 mg | Freq: Once | INTRAMUSCULAR | Status: AC
  Administered 2022-07-22: 200 mg via INTRAMUSCULAR

## 2022-07-22 NOTE — Progress Notes (Signed)
Patient here for a Testosterone injection 100mg/1ml IM injection in RIGHT upper outer quadrant. Patient tolerated well and will return in 1 week for his next injection    

## 2022-07-29 ENCOUNTER — Ambulatory Visit (INDEPENDENT_AMBULATORY_CARE_PROVIDER_SITE_OTHER): Payer: BC Managed Care – PPO

## 2022-07-29 VITALS — BP 108/80 | HR 77 | Temp 97.7°F | Ht 72.0 in | Wt 257.8 lb

## 2022-07-29 DIAGNOSIS — E349 Endocrine disorder, unspecified: Secondary | ICD-10-CM

## 2022-07-29 MED ORDER — TESTOSTERONE CYPIONATE 200 MG/ML IM SOLN
200.0000 mg | Freq: Once | INTRAMUSCULAR | Status: AC
  Administered 2022-07-29: 200 mg via INTRAMUSCULAR

## 2022-07-29 NOTE — Progress Notes (Signed)
Patient here for a Testosterone injection 100mg/1ml IM injection in LEFT upper outer quadrant. Patient tolerated well and will return in 1 week for his next injection 

## 2022-08-05 ENCOUNTER — Ambulatory Visit (INDEPENDENT_AMBULATORY_CARE_PROVIDER_SITE_OTHER): Payer: BC Managed Care – PPO

## 2022-08-05 VITALS — BP 118/80 | HR 82 | Temp 97.5°F | Ht 72.0 in | Wt 254.0 lb

## 2022-08-05 DIAGNOSIS — E349 Endocrine disorder, unspecified: Secondary | ICD-10-CM | POA: Diagnosis not present

## 2022-08-05 MED ORDER — TESTOSTERONE CYPIONATE 200 MG/ML IM SOLN
200.0000 mg | Freq: Once | INTRAMUSCULAR | Status: AC
  Administered 2022-08-05: 100 mg via INTRAMUSCULAR

## 2022-08-05 NOTE — Progress Notes (Signed)
Patient here for a Testosterone injection 100mg/1ml IM injection in RIGHT upper outer quadrant. Patient tolerated well and will return in 1 week for his next injection    

## 2022-08-12 ENCOUNTER — Ambulatory Visit (INDEPENDENT_AMBULATORY_CARE_PROVIDER_SITE_OTHER): Payer: BC Managed Care – PPO

## 2022-08-12 VITALS — BP 140/82 | HR 83 | Temp 97.5°F | Ht 72.0 in | Wt 251.4 lb

## 2022-08-12 DIAGNOSIS — E349 Endocrine disorder, unspecified: Secondary | ICD-10-CM | POA: Diagnosis not present

## 2022-08-12 MED ORDER — TESTOSTERONE CYPIONATE 200 MG/ML IM SOLN
200.0000 mg | Freq: Once | INTRAMUSCULAR | Status: AC
  Administered 2022-08-12: 200 mg via INTRAMUSCULAR

## 2022-08-12 NOTE — Progress Notes (Signed)
Patient here for a Testosterone injection 100mg/1ml IM injection in LEFT upper outer quadrant. Patient tolerated well and will return in 1 week for his next injection 

## 2022-08-19 ENCOUNTER — Ambulatory Visit (INDEPENDENT_AMBULATORY_CARE_PROVIDER_SITE_OTHER): Payer: BC Managed Care – PPO

## 2022-08-19 VITALS — BP 138/80 | HR 75 | Temp 97.6°F | Resp 17 | Ht 72.0 in | Wt 261.0 lb

## 2022-08-19 DIAGNOSIS — E349 Endocrine disorder, unspecified: Secondary | ICD-10-CM

## 2022-08-19 MED ORDER — TESTOSTERONE CYPIONATE 200 MG/ML IM SOLN
200.0000 mg | Freq: Once | INTRAMUSCULAR | Status: AC
  Administered 2022-08-19: 200 mg via INTRAMUSCULAR

## 2022-08-19 NOTE — Progress Notes (Signed)
Patient here for a Testosterone injection 100mg/1ml IM injection in RIGHT upper outer quadrant. Patient tolerated well and will return in 1 week for his next injection    

## 2022-08-26 ENCOUNTER — Ambulatory Visit (INDEPENDENT_AMBULATORY_CARE_PROVIDER_SITE_OTHER): Payer: BC Managed Care – PPO

## 2022-08-26 VITALS — BP 132/84 | HR 66 | Temp 97.7°F | Ht 72.0 in | Wt 249.0 lb

## 2022-08-26 DIAGNOSIS — E349 Endocrine disorder, unspecified: Secondary | ICD-10-CM

## 2022-08-26 MED ORDER — TESTOSTERONE CYPIONATE 200 MG/ML IM SOLN
200.0000 mg | Freq: Once | INTRAMUSCULAR | Status: AC
  Administered 2022-08-26: 200 mg via INTRAMUSCULAR

## 2022-08-26 NOTE — Progress Notes (Signed)
Patient here for a Testosterone injection 100mg/1ml IM injection in LEFT upper outer quadrant. Patient tolerated well and will return in 1 week for his next injection 

## 2022-09-02 ENCOUNTER — Ambulatory Visit (INDEPENDENT_AMBULATORY_CARE_PROVIDER_SITE_OTHER): Payer: BC Managed Care – PPO

## 2022-09-02 VITALS — BP 122/80 | HR 87 | Temp 97.8°F | Resp 16 | Ht 72.0 in | Wt 257.4 lb

## 2022-09-02 DIAGNOSIS — E349 Endocrine disorder, unspecified: Secondary | ICD-10-CM | POA: Diagnosis not present

## 2022-09-02 MED ORDER — TESTOSTERONE CYPIONATE 200 MG/ML IM SOLN
200.0000 mg | Freq: Once | INTRAMUSCULAR | Status: AC
  Administered 2022-09-02: 200 mg via INTRAMUSCULAR

## 2022-09-02 NOTE — Progress Notes (Signed)
Patient here for a Testosterone injection 100mg/1ml IM injection in RIGHT upper outer quadrant. Patient tolerated well and will return in 1 week for his next injection    

## 2022-09-09 ENCOUNTER — Ambulatory Visit (INDEPENDENT_AMBULATORY_CARE_PROVIDER_SITE_OTHER): Payer: BC Managed Care – PPO

## 2022-09-09 VITALS — BP 122/80 | HR 85 | Temp 97.9°F | Resp 16 | Ht 72.0 in | Wt 255.2 lb

## 2022-09-09 DIAGNOSIS — E349 Endocrine disorder, unspecified: Secondary | ICD-10-CM

## 2022-09-09 MED ORDER — TESTOSTERONE CYPIONATE 200 MG/ML IM SOLN
200.0000 mg | Freq: Once | INTRAMUSCULAR | Status: AC
  Administered 2022-09-09: 200 mg via INTRAMUSCULAR

## 2022-09-09 NOTE — Progress Notes (Signed)
Patient here for a Testosterone injection 100mg/1ml IM injection in LEFT upper outer quadrant. Patient tolerated well and will return in 1 week for his next injection 

## 2022-09-10 ENCOUNTER — Encounter: Payer: Self-pay | Admitting: Internal Medicine

## 2022-09-16 ENCOUNTER — Encounter: Payer: Self-pay | Admitting: Internal Medicine

## 2022-09-16 ENCOUNTER — Ambulatory Visit (INDEPENDENT_AMBULATORY_CARE_PROVIDER_SITE_OTHER): Payer: BC Managed Care – PPO | Admitting: Internal Medicine

## 2022-09-16 VITALS — BP 124/80 | HR 79 | Temp 97.9°F | Resp 17 | Ht 72.0 in | Wt 255.0 lb

## 2022-09-16 DIAGNOSIS — E6609 Other obesity due to excess calories: Secondary | ICD-10-CM | POA: Diagnosis not present

## 2022-09-16 DIAGNOSIS — I708 Atherosclerosis of other arteries: Secondary | ICD-10-CM

## 2022-09-16 DIAGNOSIS — E559 Vitamin D deficiency, unspecified: Secondary | ICD-10-CM

## 2022-09-16 DIAGNOSIS — Z79899 Other long term (current) drug therapy: Secondary | ICD-10-CM

## 2022-09-16 DIAGNOSIS — Z6835 Body mass index (BMI) 35.0-35.9, adult: Secondary | ICD-10-CM

## 2022-09-16 DIAGNOSIS — E785 Hyperlipidemia, unspecified: Secondary | ICD-10-CM

## 2022-09-16 DIAGNOSIS — E1122 Type 2 diabetes mellitus with diabetic chronic kidney disease: Secondary | ICD-10-CM

## 2022-09-16 DIAGNOSIS — I700 Atherosclerosis of aorta: Secondary | ICD-10-CM

## 2022-09-16 DIAGNOSIS — M109 Gout, unspecified: Secondary | ICD-10-CM

## 2022-09-16 DIAGNOSIS — N182 Chronic kidney disease, stage 2 (mild): Secondary | ICD-10-CM

## 2022-09-16 DIAGNOSIS — E1169 Type 2 diabetes mellitus with other specified complication: Secondary | ICD-10-CM

## 2022-09-16 DIAGNOSIS — I10 Essential (primary) hypertension: Secondary | ICD-10-CM

## 2022-09-16 DIAGNOSIS — E349 Endocrine disorder, unspecified: Secondary | ICD-10-CM

## 2022-09-16 MED ORDER — TESTOSTERONE CYPIONATE 200 MG/ML IM SOLN
200.0000 mg | Freq: Once | INTRAMUSCULAR | Status: AC
  Administered 2022-09-16: 200 mg via INTRAMUSCULAR

## 2022-09-16 MED ORDER — TOPIRAMATE 100 MG PO TABS: ORAL_TABLET | ORAL | 1 refills | Status: DC

## 2022-09-16 MED ORDER — PHENTERMINE HCL 37.5 MG PO TABS: ORAL_TABLET | ORAL | 1 refills | Status: DC

## 2022-09-16 NOTE — Progress Notes (Signed)
Patient here for a Testosterone injection 100mg/1ml IM injection in RIGHT upper outer quadrant. Patient tolerated well and will return in 1 week for his next injection    

## 2022-09-16 NOTE — Progress Notes (Signed)
Future Appointments  Date Time Provider Department  09/16/2022  3:30 PM Unk Pinto, MD [facility]  12/11/2022                       cpe 11:00 AM Unk Pinto, MD [facility]      History of Present Illness:       This very nice 67 y.o. [name] presents for 9 month follow up with HTN, HLD, Pre-Diabetes and Vitamin D Deficiency. Abd CT scan in 2019 showed Aorto-Iliac Atherosclerosis.       Patient is treated for HTN (2005) & BP has been controlled at home. Today's BP is at goal  -124/80. Patient has had no complaints of any cardiac type chest pain, palpitations, dyspnea Vertell Limber /PND, dizziness, claudication , but does have refractory LE dependent edema  attributed to his Morbid Obesity significantly improved with his 100# weight loss over the last 18 months.        Hyperlipidemia is controlled with diet & meds. Patient denies myalgias or other med SE's. Last Lipids were  at goal:  Lab Results  Component Value Date   CHOL 158 06/11/2022   HDL 52 06/11/2022   LDLCALC 92 06/11/2022   TRIG 49 06/11/2022   CHOLHDL 3.0 06/11/2022     Also, the patient has history of  Morbid Obesity ( BMI  34.61 ) and  admits compulsive overeating  with hx/o glucose intolerance /prediabetes  (A1c 5.9% /2010 and 6.0% 12 /2021 ) .  Weight was 350# in July 2022 when started [facility]. Later was switched to [employer] for [employer]. Patient is felt to have diet controlled diabetes. Patient has had no symptoms of reactive hypoglycemia, diabetic polys, paresthesias or visual blurring.  Last A1c was not at goal:  Lab Results  Component Value Date   HGBA1C 5.1 06/11/2022   Wt Readings from Last 3 Encounters:  09/16/22 255 lb (115.7 kg)  09/09/22 255 lb 3.2 oz (115.8 kg)  09/02/22 257 lb 6.4 oz (116.8 kg)                                                      Further, the patient also has history of Vitamin D Deficiency and supplements vitamin D without any suspected side-effects. Last vitamin  D was elevated & dose 50K was tapered to qod  :  Lab Results  Component Value Date   VD25OH 119 (H) 12/04/2021       Current Outpatient Medications:     acyclovir (ZOVIRAX) 800 MG tablet, TAKE 1 TABLET BY MOUTH  DAILY FOR HSV, Disp: 90 tablet, Rfl: 1   allopurinol (ZYLOPRIM) 300 MG tablet, Take  1 tablet Daily to Prevent Gout      ALPRAZolam (XANAX) 1 MG tablet, Take 1/2 - 1 tablet 2 - 3 x /day ONLY if needed for Panic or Severe Anxiety Attack &  limit to 5 days /week to avoid Addiction & Dementia     aspirin EC 81 MG tablet, Take 81 mg by mouth daily., Disp: , Rfl:    atenolol (TENORMIN) 100 MG tablet, Take  1 tablet  Daily  for BP    buPROPion (WELLBUTRIN XL) 300 MG 24 hr tablet, Take  1 tablet  Daily for Mood, Focus & concentration       Cholecalciferol (VITAMIN D3)  125 MCG (5000 UT) CAPS, Take  2 capsules (10,000 u)  every day, Disp: 730 capsule, Rfl: 99   Cyanocobalamin (VITAMIN B 12 PO), Take 1 tablet by mouth daily., Disp: , Rfl:    cyclobenzaprine (FLEXERIL) 10 MG tablet, Take  1/2 to 1 tablet  3 x /day   as needed for Muscle Spasm, Disp: 90 tablet, Rfl: 0   Dulaglutide (TRULICITY) 4.5 IR/4.8NI SOPN, Inject 4.5 mg into skin every 7 days  for Diabetes  ( Dx: e11.29 ), Disp: 6 mL, Rfl: 3   esomeprazole (NEXIUM) 40 MG capsule, TAKE 1 CAPSULE  TWICE DAILY FOR GERD AND  BARRETTS ESOPHAGITIS, Disp: 180 capsule, Rfl: 3   EUCRISA 2 % OINT, APPLY TWO TIMES A DAY (Patient taking differently: as needed.), Disp: 100 g, Rfl: 1   fenofibrate 160 MG tablet, Take 1 tablet Daily for Triglycerides (Blood Fats)                                            fish oil-omega-3 fatty acids 1000 MG capsule, Take 1 g by mouth daily., Disp: , Rfl:    Flaxseed, Linseed, (FLAX SEEDS PO), Take by mouth., Disp: , Rfl:    magnesium oxide (MAG-OX) 400 MG tablet, Take 400 mg by mouth 2 (two) times daily. , Disp: , Rfl:    montelukast (SINGULAIR) 10 MG tablet, Take  1 tablet  Daily  for Allergies                                 Multiple Vitamin (MULTIVITAMIN WITH MINERALS) TABS tablet, Take 1 tablet by mouth daily., Disp: , Rfl:    daily. Sea moss  with beet root-take 2 capsules   potassium chloride 10 MEQ tablet, TAKE 1 TABLET 3  TIMES DAILY, Disp: 270 tablet, Rfl: 1   terazosin (HYTRIN) 2 MG capsule, Take  1 capsule at Bedtime  for BP & Prostate                                            testosterone cypio 200 MG/ML injection, Inject 1 mL (200 mg total) into the muscle once a week., Disp: 10 mL, Rfl: 1   torsemide (DEMADEX) 20 MG tablet, Take  1 tablet  2 x /day  am & pm  for severe leg swelling (Patient taking differently: 2 (two) times daily. Take  1 tablet  2 x /day  am & pm  for severe leg swelling), Disp: 180 tablet, Rfl: 3   traZODone (DESYREL) 150 MG tablet, Take 1/2 to 1 tablet 1 to 2 hours before Bedtime as needed for Sleep   (Dx:  f 51.01), Disp: 90 tablet, Rfl: 1   Turmeric (QC TUMERIC COMPLEX PO), Take Daily   valsartan (DIOVAN) 320 MG tablet, Take   1 tablet Daily for  BP          vitamin C (ASCORBIC ACID) 500 MG tablet, Take  daily   Zinc 50 MG TABS, Take daily  No Known Allergies   PMHx:   Past Medical History:  Diagnosis Date   Allergy    Anxiety    Asthma    as a child   Barrett's esophagus  Closed fracture of nasal septum 06/02/2016   GERD (gastroesophageal reflux disease)    Gout    never had a flare up of gout   H/O hiatal hernia    Hyperlipidemia    Hypertension    Prediabetes    Sleep apnea    does not use  cpap   Vitamin D deficiency      Immunization History  Administered Date(s) Administered   Influenza Inj Mdck Quad  07/02/2017, 06/15/2018   Influenza Split 06/28/2014, 06/28/2015   Influenza,inj,Quad  06/19/2019   Influenza,inj,quad 08/11/2016   Influenza 09/01/2012   PFIZER SARS-COV-2 Vacc 10/17/2019, 11/07/2019, 09/02/2020   PPD Test 11/01/2018, 11/21/2019, 11/28/2020   Pneumococcal-23 09/01/1998   Tdap 08/01/2013   Zoster Recombinat (Shingrix)  06/19/2019, 08/23/2019     Past Surgical History:  Procedure Laterality Date   CLOSED REDUCTION NASAL FRACTURE N/A 06/10/2016   Procedure: CLOSED REDUCTION NASAL FRACTURE FOREHEAD SUTURE REMOVAL;  Surgeon: [name], DO;  Location: [facility];  Service: Plastics;  Laterality: N/A;   COLONOSCOPY     HEMORRHOID SURGERY  1980   Left knee Arthroscopy     Right knee arthroscopy     UPPER GASTROINTESTINAL ENDOSCOPY     last EGD  08-08-2011    FHx:    Reviewed / unchanged  SHx:    Reviewed / unchanged   Systems Review:  Constitutional: Denies fever, chills, wt changes, headaches, insomnia, fatigue, night sweats, change in appetite. Eyes: Denies redness, blurred vision, diplopia, discharge, itchy, watery eyes.  ENT: Denies discharge, congestion, post nasal drip, epistaxis, sore throat, earache, hearing loss, dental pain, tinnitus, vertigo, sinus pain, snoring.  CV: Denies chest pain, palpitations, irregular heartbeat, syncope, dyspnea, diaphoresis, orthopnea, PND, claudication or edema. Respiratory: denies cough, dyspnea, DOE, pleurisy, hoarseness, laryngitis, wheezing.  Gastrointestinal: Denies dysphagia, odynophagia, heartburn, reflux, water brash, abdominal pain or cramps, nausea, vomiting, bloating, diarrhea, constipation, hematemesis, melena, hematochezia  or hemorrhoids. Genitourinary: Denies dysuria, frequency, urgency, nocturia, hesitancy, discharge, hematuria or flank pain. Musculoskeletal: Denies arthralgias, myalgias, stiffness, jt. swelling, pain, limping or strain/sprain.  Skin: Denies pruritus, rash, hives, warts, acne, eczema or change in skin lesion(s). Neuro: No weakness, tremor, incoordination, spasms, paresthesia or pain. Psychiatric: Denies confusion, memory loss or sensory loss. Endo: Denies change in weight, skin or hair change.  Heme/Lymph: No excessive bleeding, bruising or enlarged lymph nodes.  Physical Exam  BP 124/80   Pulse 79    Temp 97.9 F (36.6 C)   Resp 17   Ht 6' (1.829 m)   Wt 255 lb (115.7 kg)   SpO2 96%   BMI 34.58 kg/m   Appears  over nourished and in no distress.  Eyes: PERRLA, EOMs, conjunctiva no swelling or erythema. Sinuses: No frontal/maxillary tenderness ENT/Mouth: EAC's clear, TM's nl w/o erythema, bulging. Nares clear w/o erythema, swelling, exudates. Oropharynx clear without erythema or exudates. Oral hygiene is good. Tongue normal, non obstructing. Hearing intact.  Neck: Supple. Thyroid not palpable. Car 2+/2+ without bruits, nodes or JVD. Chest: Respirations nl with BS clear & equal w/o rales, rhonchi, wheezing or stridor.  Cor: Heart sounds normal w/ regular rate and rhythm without sig. murmurs, gallops, clicks or rubs. Peripheral pulses normal and equal  without edema.  Abdomen: Soft & bowel sounds normal. Non-tender w/o guarding, rebound, hernias, masses or organomegaly.  Lymphatics: Unremarkable.  Musculoskeletal: Full ROM all peripheral extremities, joint stability, 5/5 strength and normal gait.  Skin: Warm, dry without exposed rashes, lesions or ecchymosis apparent.  Neuro:  Cranial nerves intact, reflexes equal bilaterally. Sensory-motor testing grossly intact. Tendon reflexes grossly intact.  Pysch: Alert & oriented x 3.  Insight and judgement nl & appropriate. No ideations.  Assessment and Plan:  1. Class 2 obesity due to excess calories without serious comorbidity with body mass index (BMI) of 35.0 to 35.9 in adult  - Rx - Phentermine 37.5 mg  - sig 1/2 1 tab qam and Rx - Topiramate 10 mg - sig 1-2 qhs  - TSH  2. Essential hypertension   - Continue medication, monitor blood pressure at home.  - Continue DASH diet.  Reminder to go to the ER if any CP,  SOB, nausea, dizziness, severe HA, changes vision/speech.   - CBC with Differential/Platelet - COMPLETE METABOLIC PANEL WITH GFR - Magnesium - TSH  3. Hyperlipidemia associated with type 2 diabetes mellitus ([place])   -  Continue diet/meds, exercise,& lifestyle modifications.  - Continue monitor periodic cholesterol/liver & renal functions    - Lipid panel - TSH  4. Type 2 diabetes mellitus with stage 2 chronic kidney disease, without long-term current use of insulin ([facility])   - Continue diet, exercise  - Lifestyle modifications.  - Monitor appropriate labs   - Hemoglobin A1c - Insulin, random  5. Vitamin D deficiency   - Continue supplementation   - VITAMIN D 25 Hydroxy   6. Aorto-iliac atherosclerosis ([place]) by CT Scan on 01/08/2018  - Lipid panel  7. Gout  - Uric acid  8. Testosterone deficiency  - Testosterone  9. Medication management  - CBC with Differential/Platelet - COMPLETE METABOLIC PANEL WITH GFR - Magnesium - Lipid panel - TSH - Hemoglobin A1c - Insulin, random - VITAMIN D 25 Hydroxy - Testosterone - Uric acid          Discussed  regular exercise, BP monitoring, weight control to achieve/maintain BMI less than 25 and discussed med and SE's. Recommended labs to assess and monitor clinical status with further disposition pending results of labs.  I discussed the assessment and treatment plan with the patient. The patient was provided an opportunity to ask questions and all were answered. The patient agreed with the plan and demonstrated an understanding of the instructions.  I provided over 30 minutes of exam, counseling, chart review and  complex critical decision making.        The patient was advised to call back or seek an in-person evaluation if the symptoms worsen or if the condition fails to improve as anticipated.   Kirtland Bouchard, MD

## 2022-09-16 NOTE — Patient Instructions (Addendum)
Due to recent changes in healthcare laws, you may see the results of your imaging and laboratory studies on MyChart before your provider has had a chance to review them.  We understand that in some cases there may be results that are confusing or concerning to you. Not all laboratory results come back in the same time frame and the provider may be waiting for multiple results in order to interpret others.  Please give [place] 48 hours in order for your provider to thoroughly review all the results before contacting the office for clarification of your results.  ++++++++++++++++++++++++++ Obesity, Adult  Obesity is the condition of having too much total body fat. Being overweight or obese means that your weight is greater than what is considered healthy for your body size. Obesity is determined by a measurement called BMI (body mass index). BMI is an estimate of body fat and is calculated from height and weight. For adults, a BMI of 30 or higher is considered obese.  Obesity can lead to other health concerns and major illnesses, including: Stroke. Coronary artery disease (CAD). Type 2 diabetes. Some types of cancer, including cancers of the colon, breast, uterus, and gallbladder. High blood pressure (hypertension). High cholesterol. Gallbladder stones.  Obesity can also contribute to: Osteoarthritis. Sleep apnea  What are the causes?  Common causes of this condition include:  Eating daily meals that are high in calories, sugar, and fat. Drinking high amounts of sugar-sweetened beverages, such as soft drinks. Being born with genes that may make you more likely to become obese. Having a medical condition that causes obesity, including: Hypothyroidism. Binge-eating disorder. Not being physically active (sedentary lifestyle). Not getting enough sleep  What increases the risk?  The following factors may make you more likely to develop this condition: Having a family history of  obesity. Living in an area with limited access to: [place], recreation centers, or sidewalks. Healthy food choices, such as grocery stores and farmers' markets.  What are the signs or symptoms?  The main sign of this condition is having too much body fat.  How is this diagnosed?  This condition is diagnosed based on:  Your BMI. If you are an adult with a BMI of 30 or higher, you are considered obese. Your waist circumference. This measures the distance around your waistline. Your skinfold thickness. Your health care provider may gently pinch a fold of your skin and measure it.  You may have other tests to check for underlying conditions. How is this treated?  Treatment for this condition often includes changing your lifestyle. Treatment may include some or all of the following:  Dietary changes. This may include developing a healthy meal plan. Regular physical activity. This may include activity that causes your heart to beat faster (aerobic exercise) and strength training. Work with your health care provider to design an exercise program that works for you. Medicine to help you lose weight if you are unable to lose one pound a week after six weeks of healthy eating and more physical activity. Treating conditions that cause the obesity (underlying conditions). Surgery. Surgical options may include gastric banding and gastric bypass. Surgery may be done if: Other treatments have not helped to improve your condition. You have a BMI of 40 or higher. You have life-threatening health problems related to obesity. Follow these instructions at home: Eating and drinking  Follow recommendations from your health care provider about what you eat and drink. Your health care provider may advise you to: Limit fast  food, sweets, and processed snack foods. Choose low-fat options, such as low-fat milk instead of whole milk. Eat five or more servings of fruits or vegetables every day. Choose healthy  foods when you eat out. Keep low-fat snacks available. Limit sugary drinks, such as soda, fruit juice, sweetened iced tea, and flavored milk.  Drink enough water to keep your urine pale yellow. Do not follow a fad diet. Fad diets can be unhealthy and even dangerous.  Other healthful choices include: Eat at home more often. This gives you more control over what you eat. Learn to read food labels. This will help you understand how much food is considered one serving. Learn what a healthy serving size is.  Physical activity  Exercise regularly, as told by your health care provider. Most adults should get up to 150 minutes of moderate-intensity exercise every week. Ask your health care provider what types of exercise are safe for you and how often you should exercise. Warm up and stretch before being active. Cool down and stretch after being active. Rest between periods of activity.  Lifestyle  Work with your health care provider and a dietitian to set a weight-loss goal that is healthy and reasonable for you. Limit your screen time. Find ways to reward yourself that do not involve food. Do not drink alcohol if: Your health care provider tells you not to drink.  General instructions  Keep a weight-loss journal to keep track of the food you eat and how much exercise you get. Take over-the-counter and prescription medicines only as told by your health care provider. Take vitamins and supplements only as told by your health care provider. Consider joining a support group. Your health care provider may be able to recommend a support group. Pay attention to your mental health as obesity can lead to depression or self esteem issues. Keep all follow-up visits. This is important.  Contact a health care provider if:  You have trouble breathing.  Summary  Obesity is the condition of having too much total body fat.  Being overweight or obese means that your weight is greater than what  is considered healthy for your body size. Work with your health care provider and a dietitian to set a weight-loss goal that is healthy and reasonable for you. Exercise regularly, as told by your health care provider. Ask your health care provider what types of exercise are safe for you and how often you should exercise.    ++++++++++++++++++++++++++  Vit D  & Vit C 1,000 mg   are recommended to help protect  against the Covid-19 and other Corona viruses.    Also it's recommended  to take  Zinc 50 mg  to help  protect against the Covid-19   and best place to get  is also on [email or web address]  and don't pay more than 6-8 cents /pill !   +++++++++++++++++++++++++++++++++++++++ Recommend Adult Low Dose Aspirin or  coated  Aspirin 81 mg daily  To reduce risk of Colon Cancer 40 %,  Skin Cancer 26 % ,  Melanoma 46%  and  Pancreatic cancer 60% +++++++++++++++++++++++++++++++++++++++++ Vitamin D goal  is between 70-100.  Please make sure that you are taking your Vitamin D as directed.  It is very important as a natural anti-inflammatory  helping hair, skin, and nails, as well as reducing stroke and heart attack risk.  It helps your bones and helps with mood. It also decreases numerous cancer risks so please take it as directed.  Low Vit  D is associated with a 200-300% higher risk for CANCER  and 200-300% higher risk for HEART   ATTACK  &  STROKE.   .....................................Marland Kitchen It is also associated with higher death rate at younger ages,  autoimmune diseases like Rheumatoid arthritis, Lupus, Multiple Sclerosis.    Also many other serious conditions, like depression, Alzheimer's Dementia, infertility, muscle aches, fatigue, fibromyalgia - just to name a few. +++++++++++++++++++++++++++++++++++++++++ Recommend the book "The END of DIETING" by Dr Excell Seltzer  & the book "The END of DIABETES " by Dr Excell Seltzer At [email or web address] - get book & Audio CD's    Being diabetic has a   300% increased risk for heart attack, stroke, cancer, and alzheimer- type vascular dementia. It is very important that you work harder with diet by avoiding all foods that are white. Avoid white rice (brown & wild rice is OK), white potatoes (sweetpotatoes in moderation is OK), White bread or wheat bread or anything made out of white flour like bagels, donuts, rolls, buns, biscuits, cakes, pastries, cookies, pizza crust, and pasta (made from white flour & egg whites) - vegetarian pasta or spinach or wheat pasta is OK. Multigrain breads like Arnold's or [employer], or multigrain sandwich thins or flatbreads.  Diet, exercise and weight loss can reverse and cure diabetes in the early stages.  Diet, exercise and weight loss is very important in the control and prevention of complications of diabetes which affects every system in your body, ie. Brain - dementia/stroke, eyes - glaucoma/blindness, heart - heart attack/heart failure, kidneys - dialysis, stomach - gastric paralysis, intestines - malabsorption, nerves - severe painful neuritis, circulation - gangrene & loss of a leg(s), and finally cancer and Alzheimers.    I recommend avoid fried & greasy foods,  sweets/candy, white rice (brown or wild rice or Quinoa is OK), white potatoes (sweet potatoes are OK) - anything made from white flour - bagels, doughnuts, rolls, buns, biscuits,white and wheat breads, pizza crust and traditional pasta made of white flour & egg white(vegetarian pasta or spinach or wheat pasta is OK).  Multi-grain bread is OK - like multi-grain flat bread or sandwich thins. Avoid alcohol in excess. Exercise is also important.    Eat all the vegetables you want - avoid meat, especially red meat and dairy - especially cheese.  Cheese is the most concentrated form of trans-fats which is the worst thing to clog up our arteries. Veggie cheese is OK which can be found in the fresh produce section at [employer] or [employer] or  [employer]  +++++++++++++++++++++++++++++++++++++++ DASH Eating Plan  DASH stands for "Dietary Approaches to Stop Hypertension."   The DASH eating plan is a healthy eating plan that has been shown to reduce high blood pressure (hypertension). Additional health benefits may include reducing the risk of type 2 diabetes mellitus, heart disease, and stroke. The DASH eating plan may also help with weight loss. WHAT DO I NEED TO KNOW ABOUT THE DASH EATING PLAN? For the DASH eating plan, you will follow these general guidelines: Choose foods with a percent daily value for sodium of less than 5% (as listed on the food label). Use salt-free seasonings or herbs instead of table salt or sea salt. Check with your health care provider or pharmacist before using salt substitutes. Eat lower-sodium products, often labeled as "lower sodium" or "no salt added." Eat fresh foods. Eat more vegetables, fruits, and low-fat dairy products. Choose whole grains. Look for the word "whole" as the first word in  the ingredient list. Choose fish  Limit sweets, desserts, sugars, and sugary drinks. Choose heart-healthy fats. Eat veggie cheese  Eat more home-cooked food and less restaurant, buffet, and fast food. Limit fried foods. Cook foods using methods other than frying. Limit canned vegetables. If you do use them, rinse them well to decrease the sodium. When eating at a restaurant, ask that your food be prepared with less salt, or no salt if possible.                      WHAT FOODS CAN I EAT? Read Dr Fara Olden Fuhrman's books on The End of Dieting & The End of Diabetes  Grains Whole grain or whole wheat bread. Brown rice. Whole grain or whole wheat pasta. Quinoa, bulgur, and whole grain cereals. Low-sodium cereals. Corn or whole wheat flour tortillas. Whole grain cornbread. Whole grain crackers. Low-sodium crackers.  Vegetables Fresh or frozen vegetables (raw, steamed, roasted, or grilled). Low-sodium or  reduced-sodium tomato and vegetable juices. Low-sodium or reduced-sodium tomato sauce and paste. Low-sodium or reduced-sodium canned vegetables.   Fruits All fresh, canned (in natural juice), or frozen fruits.  Protein Products  All fish and seafood.  Dried beans, peas, or lentils. Unsalted nuts and seeds. Unsalted canned beans.  Dairy Low-fat dairy products, such as skim or 1% milk, 2% or reduced-fat cheeses, low-fat ricotta or cottage cheese, or plain low-fat yogurt. Low-sodium or reduced-sodium cheeses.  Fats and Oils Tub margarines without trans fats. Light or reduced-fat mayonnaise and salad dressings (reduced sodium). Avocado. Safflower, olive, or canola oils. Natural peanut or almond butter.  Other Unsalted popcorn and pretzels. The items listed above may not be a complete list of recommended foods or beverages. Contact your dietitian for more options.  +++++++++++++++  WHAT FOODS ARE NOT RECOMMENDED? Grains/ White flour or wheat flour White bread. White pasta. White rice. Refined cornbread. Bagels and croissants. Crackers that contain trans fat.  Vegetables  Creamed or fried vegetables. Vegetables in a . Regular canned vegetables. Regular canned tomato sauce and paste. Regular tomato and vegetable juices.  Fruits Dried fruits. Canned fruit in light or heavy syrup. Fruit juice.  Meat and Other Protein Products Meat in general - RED meat & White meat.  Fatty cuts of meat. Ribs, chicken wings, all processed meats as bacon, sausage, bologna, salami, fatback, hot dogs, bratwurst and packaged luncheon meats.  Dairy Whole or 2% milk, cream, half-and-half, and cream cheese. Whole-fat or sweetened yogurt. Full-fat cheeses or blue cheese. Non-dairy creamers and whipped toppings. Processed cheese, cheese spreads, or cheese curds.  Condiments Onion and garlic salt, seasoned salt, table salt, and sea salt. Canned and packaged gravies. Worcestershire sauce. Tartar sauce. Barbecue  sauce. Teriyaki sauce. Soy sauce, including reduced sodium. Steak sauce. Fish sauce. Oyster sauce. Cocktail sauce. Horseradish. Ketchup and mustard. Meat flavorings and tenderizers. Bouillon cubes. Hot sauce. Tabasco sauce. Marinades. Taco seasonings. Relishes.  Fats and Oils Butter, stick margarine, lard, shortening and bacon fat. Coconut, palm kernel, or palm oils. Regular salad dressings.  Pickles and olives. Salted popcorn and pretzels.  The items listed above may not be a complete list of foods and beverages to avoid.

## 2022-09-17 LAB — LIPID PANEL
Cholesterol: 150 mg/dL (ref <200)
HDL: 48 mg/dL (ref >=40)
LDL Cholesterol (Calc): 88 mg/dL (calc)
Non-HDL Cholesterol (Calc): 102 mg/dL (calc) (ref <130)
Total CHOL/HDL Ratio: 3.1 (calc) (ref <5.0)
Triglycerides: 46 mg/dL (ref <150)

## 2022-09-17 LAB — CBC WITH DIFFERENTIAL/PLATELET
Absolute Monocytes: 749 cells/uL (ref 200–950)
Basophils Absolute: 32 cells/uL (ref 0–200)
Basophils Relative: 0.5 %
Eosinophils Absolute: 128 cells/uL (ref 15–500)
Eosinophils Relative: 2 %
HCT: 45.6 % (ref 38.5–50.0)
Hemoglobin: 16.2 g/dL (ref 13.2–17.1)
Lymphs Abs: 1952 cells/uL (ref 850–3900)
MCH: 33.7 pg — ABNORMAL HIGH (ref 27.0–33.0)
MCHC: 35.5 g/dL (ref 32.0–36.0)
MCV: 94.8 fL (ref 80.0–100.0)
MPV: 11.2 fL (ref 7.5–12.5)
Monocytes Relative: 11.7 %
Neutro Abs: 3539 cells/uL (ref 1500–7800)
Neutrophils Relative %: 55.3 %
Platelets: 110 10*3/uL — ABNORMAL LOW (ref 140–400)
RBC: 4.81 10*6/uL (ref 4.20–5.80)
RDW: 12.6 % (ref 11.0–15.0)
Total Lymphocyte: 30.5 %
WBC: 6.4 10*3/uL (ref 3.8–10.8)

## 2022-09-17 LAB — VITAMIN D 25 HYDROXY (VIT D DEFICIENCY, FRACTURES): Vit D, 25-Hydroxy: 93 ng/mL (ref 30–100)

## 2022-09-17 LAB — COMPLETE METABOLIC PANEL WITH GFR
AG Ratio: 1.9 (calc) (ref 1.0–2.5)
ALT: 11 U/L (ref 9–46)
AST: 16 U/L (ref 10–35)
Albumin: 4.3 g/dL (ref 3.6–5.1)
Alkaline phosphatase (APISO): 39 U/L (ref 35–144)
BUN: 12 mg/dL (ref 7–25)
CO2: 30 mmol/L (ref 20–32)
Calcium: 9.2 mg/dL (ref 8.6–10.3)
Chloride: 100 mmol/L (ref 98–110)
Creat: 1.26 mg/dL (ref 0.70–1.35)
Globulin: 2.3 g/dL (calc) (ref 1.9–3.7)
Glucose, Bld: 73 mg/dL (ref 65–99)
Potassium: 3.8 mmol/L (ref 3.5–5.3)
Sodium: 137 mmol/L (ref 135–146)
Total Bilirubin: 1.3 mg/dL — ABNORMAL HIGH (ref 0.2–1.2)
Total Protein: 6.6 g/dL (ref 6.1–8.1)
eGFR: 64 mL/min/{1.73_m2} (ref >=60)

## 2022-09-17 LAB — MAGNESIUM: Magnesium: 1.8 mg/dL (ref 1.5–2.5)

## 2022-09-17 LAB — URIC ACID: Uric Acid, Serum: 6.7 mg/dL (ref 4.0–8.0)

## 2022-09-17 LAB — TESTOSTERONE: Testosterone: 1336 ng/dL — ABNORMAL HIGH (ref 250–827)

## 2022-09-17 LAB — TSH: TSH: 1.76 mIU/L (ref 0.40–4.50)

## 2022-09-17 LAB — HEMOGLOBIN A1C
Hgb A1c MFr Bld: 5 % of total Hgb (ref <5.7)
Mean Plasma Glucose: 97 mg/dL
eAG (mmol/L): 5.4 mmol/L

## 2022-09-17 LAB — INSULIN, RANDOM: Insulin: 5.4 u[IU]/mL

## 2022-09-17 NOTE — Progress Notes (Signed)
<><><><><><><><><><><><><><><><><><><><><><><><><><><><><><><><><> <><><><><><><><><><><><><><><><><><><><><><><><><><><><><><><><><> -   Test results slightly outside the reference range are not unusual. If there is anything important, I will review this with you,  otherwise it is considered normal test values.  If you have further questions,  please do not hesitate to contact me at the office or via My Chart.  <><><><><><><><><><><><><><><><><><><><><><><><><><><><><><><><><> <><><><><><><><><><><><><><><><><><><><><><><><><><><><><><><><><>  -  Testosterone is High from recent shot 1 week ago   - Recommend decrease dose by 50 % to 50 mg dose every 7 days  <><><><><><><><><><><><><><><><><><><><><><><><><><><><><><><><><>  -  A1c - Normal  <><><><><><><><><><><><><><><><><><><><><><><><><><><><><><><><><>  -  Chol = 150   Excellent   - Very low risk for Heart Attack  / Stroke <><><><><><><><><><><><><><><><><><><><><><><><><><><><><><><><><>  -  Vitamin D = 93 - Excellent - Please keep dose same   <><><><><><><><><><><><><><><><><><><><><><><><><><><><><><><><><>  -  Uric Acid / Gout test - Normal - Great !                                                       Recommend Cut Allopurinol Dose in 1/2 tablet  / day  <><><><><><><><><><><><><><><><><><><><><><><><><><><><><><><><><>  -  All Else - CBC - Kidneys - Electrolytes - Liver - Magnesium & Thyroid    - all  Normal / OK <><><><><><><><><><><><><><><><><><><><><><><><><><><><><><><><><> <><><><><><><><><><><><><><><><><><><><><><><><><><><><><><><><><>

## 2022-09-23 ENCOUNTER — Ambulatory Visit (INDEPENDENT_AMBULATORY_CARE_PROVIDER_SITE_OTHER): Payer: BC Managed Care – PPO

## 2022-09-23 ENCOUNTER — Encounter: Payer: Self-pay | Admitting: Internal Medicine

## 2022-09-23 VITALS — BP 124/80 | HR 86 | Temp 97.9°F | Resp 16 | Ht 72.0 in | Wt 250.8 lb

## 2022-09-23 DIAGNOSIS — E349 Endocrine disorder, unspecified: Secondary | ICD-10-CM

## 2022-09-23 DIAGNOSIS — I872 Venous insufficiency (chronic) (peripheral): Secondary | ICD-10-CM

## 2022-09-23 MED ORDER — TORSEMIDE 20 MG PO TABS: ORAL_TABLET | ORAL | 3 refills | Status: DC

## 2022-09-23 MED ORDER — TESTOSTERONE CYPIONATE 200 MG/ML IM SOLN
200.0000 mg | Freq: Once | INTRAMUSCULAR | Status: AC
  Administered 2022-09-23: 200 mg via INTRAMUSCULAR

## 2022-09-23 NOTE — Progress Notes (Signed)
Patient here for a Testosterone injection 100mg/1ml IM injection in RIGHT upper outer quadrant. Patient tolerated well and will return in 1 week for his next injection    

## 2022-09-30 ENCOUNTER — Ambulatory Visit (INDEPENDENT_AMBULATORY_CARE_PROVIDER_SITE_OTHER): Payer: BC Managed Care – PPO

## 2022-09-30 VITALS — BP 124/82 | HR 72 | Temp 98.1°F | Ht 72.0 in | Wt 246.2 lb

## 2022-09-30 DIAGNOSIS — E349 Endocrine disorder, unspecified: Secondary | ICD-10-CM

## 2022-09-30 MED ORDER — TESTOSTERONE CYPIONATE 200 MG/ML IM SOLN
200.0000 mg | Freq: Once | INTRAMUSCULAR | Status: AC
  Administered 2022-09-30: 200 mg via INTRAMUSCULAR

## 2022-09-30 NOTE — Progress Notes (Signed)
Patient here for a Testosterone injection 100mg/1ml IM injection in LEFT upper outer quadrant. Patient tolerated well and will return in 1 week for his next injection 

## 2022-10-07 ENCOUNTER — Ambulatory Visit: Payer: BC Managed Care – PPO

## 2022-10-07 VITALS — BP 130/88 | HR 76 | Temp 98.1°F | Ht 72.0 in | Wt 243.0 lb

## 2022-10-07 DIAGNOSIS — E349 Endocrine disorder, unspecified: Secondary | ICD-10-CM

## 2022-10-07 MED ORDER — TESTOSTERONE CYPIONATE 200 MG/ML IM SOLN
200.0000 mg | Freq: Once | INTRAMUSCULAR | Status: AC
  Administered 2022-10-07: 100 mg via INTRAMUSCULAR

## 2022-10-07 NOTE — Progress Notes (Signed)
Patient here for a Testosterone injection 100mg/1ml IM injection in RIGHT upper outer quadrant. Patient tolerated well and will return in 1 week for his next injection    

## 2022-10-14 ENCOUNTER — Ambulatory Visit (INDEPENDENT_AMBULATORY_CARE_PROVIDER_SITE_OTHER): Payer: BC Managed Care – PPO

## 2022-10-14 VITALS — BP 124/68 | HR 80 | Temp 97.7°F | Ht 72.0 in | Wt 239.8 lb

## 2022-10-14 DIAGNOSIS — E349 Endocrine disorder, unspecified: Secondary | ICD-10-CM

## 2022-10-14 MED ORDER — TESTOSTERONE CYPIONATE 200 MG/ML IM SOLN
200.0000 mg | Freq: Once | INTRAMUSCULAR | Status: AC
  Administered 2022-10-14: 200 mg via INTRAMUSCULAR

## 2022-10-14 NOTE — Progress Notes (Signed)
Patient here for a Testosterone injection 181m/1ml IM injection in LEFT upper outer quadrant. Patient tolerated well and will return in 1 week for his next injection

## 2022-10-21 ENCOUNTER — Ambulatory Visit (INDEPENDENT_AMBULATORY_CARE_PROVIDER_SITE_OTHER): Payer: BC Managed Care – PPO

## 2022-10-21 VITALS — BP 122/86 | HR 77 | Temp 98.6°F | Ht 72.0 in | Wt 246.6 lb

## 2022-10-21 DIAGNOSIS — E349 Endocrine disorder, unspecified: Secondary | ICD-10-CM

## 2022-10-21 MED ORDER — TESTOSTERONE CYPIONATE 200 MG/ML IM SOLN
200.0000 mg | Freq: Once | INTRAMUSCULAR | Status: AC
  Administered 2022-10-21: 200 mg via INTRAMUSCULAR

## 2022-10-21 NOTE — Progress Notes (Signed)
Patient here for a Testosterone injection 153m/1ml IM injection in RIGHT upper outer quadrant. Patient tolerated well and will return in 1 week for his next injection

## 2022-10-28 ENCOUNTER — Ambulatory Visit (INDEPENDENT_AMBULATORY_CARE_PROVIDER_SITE_OTHER): Payer: BC Managed Care – PPO

## 2022-10-28 VITALS — BP 150/98 | HR 88 | Temp 97.9°F | Ht 72.0 in | Wt 243.0 lb

## 2022-10-28 DIAGNOSIS — E349 Endocrine disorder, unspecified: Secondary | ICD-10-CM

## 2022-10-28 MED ORDER — TESTOSTERONE CYPIONATE 200 MG/ML IM SOLN
200.0000 mg | Freq: Once | INTRAMUSCULAR | Status: AC
  Administered 2022-10-28: 200 mg via INTRAMUSCULAR

## 2022-10-28 NOTE — Progress Notes (Signed)
Patient here for a Testosterone injection '100mg'$ /32m IM injection in LEFT upper outer quadrant. Patient tolerated well and will return in 1 week for his next injection

## 2022-11-04 ENCOUNTER — Ambulatory Visit (INDEPENDENT_AMBULATORY_CARE_PROVIDER_SITE_OTHER): Payer: BC Managed Care – PPO

## 2022-11-04 VITALS — BP 122/70 | HR 64 | Temp 97.5°F | Ht 72.0 in | Wt 243.0 lb

## 2022-11-04 DIAGNOSIS — E349 Endocrine disorder, unspecified: Secondary | ICD-10-CM

## 2022-11-04 MED ORDER — TESTOSTERONE CYPIONATE 200 MG/ML IM SOLN
200.0000 mg | Freq: Once | INTRAMUSCULAR | Status: AC
  Administered 2022-11-04: 200 mg via INTRAMUSCULAR

## 2022-11-04 NOTE — Progress Notes (Signed)
Patient here for a Testosterone injection '100mg'$ /42m IM injection in RIGHT upper outer quadrant. Patient tolerated well and will return in 1 week for his next injection

## 2022-11-11 ENCOUNTER — Ambulatory Visit (INDEPENDENT_AMBULATORY_CARE_PROVIDER_SITE_OTHER): Payer: BC Managed Care – PPO

## 2022-11-11 ENCOUNTER — Other Ambulatory Visit: Payer: Self-pay | Admitting: Internal Medicine

## 2022-11-11 VITALS — BP 138/78 | HR 85 | Temp 97.9°F | Resp 16 | Ht 72.0 in | Wt 242.6 lb

## 2022-11-11 DIAGNOSIS — E291 Testicular hypofunction: Secondary | ICD-10-CM | POA: Diagnosis not present

## 2022-11-11 DIAGNOSIS — E349 Endocrine disorder, unspecified: Secondary | ICD-10-CM

## 2022-11-11 MED ORDER — TESTOSTERONE CYPIONATE 200 MG/ML IM SOLN
200.0000 mg | Freq: Once | INTRAMUSCULAR | Status: AC
  Administered 2022-11-11: 200 mg via INTRAMUSCULAR

## 2022-11-11 NOTE — Progress Notes (Signed)
Patient here for a Testosterone injection 100mg/1ml IM injection in RIGHT upper outer quadrant. Patient tolerated well and will return in 1 week for his next injection    

## 2022-11-18 ENCOUNTER — Ambulatory Visit: Payer: BC Managed Care – PPO

## 2022-11-25 ENCOUNTER — Ambulatory Visit: Payer: BC Managed Care – PPO

## 2022-12-02 ENCOUNTER — Ambulatory Visit (INDEPENDENT_AMBULATORY_CARE_PROVIDER_SITE_OTHER): Payer: BC Managed Care – PPO

## 2022-12-02 VITALS — BP 112/78 | HR 89 | Temp 97.9°F | Ht 72.0 in | Wt 241.6 lb

## 2022-12-02 DIAGNOSIS — E349 Endocrine disorder, unspecified: Secondary | ICD-10-CM

## 2022-12-02 MED ORDER — TESTOSTERONE CYPIONATE 200 MG/ML IM SOLN
200.0000 mg | Freq: Once | INTRAMUSCULAR | Status: AC
  Administered 2022-12-02: 100 mg via INTRAMUSCULAR

## 2022-12-02 NOTE — Progress Notes (Signed)
Patient here for a Testosterone injection 100mg/1ml IM injection in LEFT upper outer quadrant. Patient tolerated well and will return in 1 week for his next injection   

## 2022-12-05 ENCOUNTER — Other Ambulatory Visit: Payer: Self-pay | Admitting: Internal Medicine

## 2022-12-05 DIAGNOSIS — F5101 Primary insomnia: Secondary | ICD-10-CM

## 2022-12-10 ENCOUNTER — Encounter: Payer: Self-pay | Admitting: Internal Medicine

## 2022-12-10 NOTE — Patient Instructions (Signed)

## 2022-12-10 NOTE — Progress Notes (Unsigned)
Annual  Screening/Preventative Visit  & Comprehensive Evaluation & Examination   Future Appointments  Date Time Provider Department  12/11/2022 11:00 AM Lucky Cowboy, MD [facility]  12/16/2022  3:00 PM [facility] NURSE [facility]  12/23/2022  3:00 PM [facility] NURSE [facility]  12/30/2022  3:00 PM [facility] NURSE [facility]            This very nice 67 y.o. [name] with  HTN, HLD, and Vitamin D Deficiency  presents for a Screening /Preventative Visit & comprehensive evaluation and management of multiple medical co-morbidities.  Abd CT scan in 2019,  showed Aorto-Iliac Atherosclerosis.       HTN predates since  2005. Patient's BP has been controlled at home.  Today's BP is at goal -                                          . Patient denies any cardiac symptoms as chest pain, palpitations, shortness of breath, dizziness or ankle swelling.       Patient's hyperlipidemia is controlled with diet and medications. Patient denies myalgias or other medication SE's.   Last lipids were not at goal :  Lab Results  Component Value Date   CHOL 150 09/16/2022   HDL 48 09/16/2022   LDLCALC 88 09/16/2022   TRIG 46 09/16/2022   CHOLHDL 3.1 09/16/2022         Patient has hx/o  Patient has  Morbid Obesity  (BMI 40.88)  and  admit compulsive overeating  with hx/o glucose intolerance /prediabetes  (A1c 5.9% /2010 and 6.0% 12 /2021) and patient denies reactive hypoglycemic symptoms, visual blurring, diabetic polys or paresthesias. Last A1c was at goal :   Lab Results  Component Value Date   HGBA1C 5.0 09/16/2022          Finally, patient has history of Vitamin D Deficiency ("17"/2008) and last vitamin D was at goal :   Lab Results  Component Value Date   VD25OH 93 06/12/2021     Current Outpatient Medications  Medication Instructions   acyclovir (ZOVIRAX) 800 MG tablet TAKE 1 TABLET   DAILY FOR HSV   allopurinol (ZYLOPRIM) 300 MG tablet Take  1 tablet Daily    ALPRAZolam (XANAX) 1 MG  tablet Take 1/2 - 1 tablet 2 - 3 x /day ONLY if needed f   ascorbic acid (VITAMIN C) 500 mg, Oral, Daily   aspirin EC 81 mg, Oral, Daily   atenolol (TENORMIN) 100 MG tablet Take  1 tablet  Daily     buPROPion  XL 300 MG  Take  1 tablet  Daily   ITAMIN D 5000  u Take  2 capsules (10,000 u)  every day   VITAMIN B 12 1 tablet, Oral, Daily,     cyclobenzaprine 10 MG tablet Take  1/2 to 1 tablet  3 x /day   as needed for Muscle Spasm   Dulaglutide (TRULICITY) 4.5 MG Inject 4.5 mg into skin every 7 days  for Diabetes  ( Dx: e11.29 )   esomeprazole (NEXIUM) 40 MG capsule TAKE 1 CAPSULE   TWICE DAILY    EUCRISA 2 % OINT APPLY TWO TIMES A DAY   fenofibrate 160 MG tablet Take 1 tablet Daily   fish oil-omega-3 fatty acids 1 g, Oral, Daily,     Flaxseed, Linseed, (FLAX SEEDS PO) Oral   magnesium oxide (MAG-OX) 400  mg, Oral, 2 times daily   montelukast (SINGULAIR) 10 MG tablet Take  1 tablet  Daily     Multiple Vitamin  1 tablet, Oral, Daily   Sea moss  with beet root Daily, -take 2 capsuls   phentermine 37.5 MG tablet Take 1/2 to 1 tablet every Morning for Dieting & Weight Loss   potassium chloride 10 MEQ tablet TAKE 1 TABLET 3  TIMES DAILY   terazosin (2 MG capsule Take  1 capsule at Bedtime     testosterone cypio 200 MG/ML injection INJECT 1 MILLILITER    I M    ONCE A WEEK   topiramate (TOPAMAX) 100 MG tablet Take 1/2 to 1 tablet 2 x /day   torsemide (DEMADEX) 20 MG tablet Take  1 tablet  2 x /day  am & pm  for severe leg swelling   traZODone (DESYREL) 150 MG tablet TAKE 1/2-1 TABLET  1-2 hrs BEFORE BEDTIME    Turmeric (QC TUMERIC COMPLEX PO) Oral   valsartan (DIOVAN) 320 MG tablet Take   1 tablet Daily    Zinc 50 MG TABS Daily     No Known Allergies   Past Medical History:  Diagnosis Date   Allergy    Anxiety    Asthma    as a child   Barrett's esophagus    Closed fracture of nasal septum 06/02/2016   GERD (gastroesophageal reflux disease)    Gout    never had a flare up of gout    H/O hiatal hernia    Hyperlipidemia    Hypertension    Prediabetes    Sleep apnea    does not use  cpap   Vitamin D deficiency      Health Maintenance  Topic Date Due   INFLUENZA VACCINE  04/01/2022   TETANUS/TDAP  08/02/2023   COVID-19 Vaccine  Completed   Zoster Vaccines- Shingrix  Completed   HPV VACCINES  Aged Out   Hepatitis C Screening  Discontinued   HIV Screening  Discontinued     Immunization History  Administered Date(s) Administered   Influenza Inj Mdck Quad 07/06/2020   Influenza Inj Mdck Quad  07/02/2017, 06/15/2018   Influenza Split 06/28/2014, 06/28/2015   Influenza,inj,Quad  06/19/2019, 06/12/2021   Influenza,inj,quad 08/11/2016   Influenza 09/01/2012, 06/26/2020   PFIZER SARS-COV-2 Vacc 10/17/2019, 11/07/2019, 09/02/2020   PPD Test 06/28/2014, 01/18/2016, 08/11/2016, 10/12/2017, 11/01/2018, 11/21/2019, 11/28/2020   Pfizer Covid-19  Bivalent Booster  01/01/2021   Pneumococcal-23 09/01/1998   Tdap 08/01/2013   Zoster Recombinat (Shingrix) 06/19/2019, 08/23/2019  ]  Last Colon - 07/03/2009  - Dr Arlyce DiceKaplan and recc 10 year f/u due Nov 2020.                     - 10.05.2020   - Dr Myrtie Neitheranis - poor prep - recc 1 year f/u colon -due                     - 02/14/2022 - Dr Myrtie Neitheranis -  serrated polyps - Recc f/u 1 year June 2024   EGD   -  10.05.202 - Dr Myrtie Neitheranis  - Neg Bx for  hx/o Barrett's - Recc 3 yr f/u  Last EGD - 02/14/2022 - Dr Myrtie Neitheranis  -  Neg Bx's - Recc repeat EGD 5 years  - due May 2028   Past Surgical History:  Procedure Laterality Date   CLOSED REDUCTION NASAL FRACTURE N/A 06/10/2016   Procedure: CLOSED REDUCTION  NASAL FRACTURE FOREHEAD SUTURE REMOVAL;  Surgeon: Peggye Form, DO;  Location: [facility];  Service: Plastics;  Laterality: N/A;   COLONOSCOPY     HEMORRHOID SURGERY  1980   Left knee Arthroscopy     Right knee arthroscopy     UPPER GASTROINTESTINAL ENDOSCOPY     last egd 08-08-2011     Family History  Problem Relation Age  of Onset   Arthritis Mother    Lung cancer Mother    Heart disease Mother    Colon cancer Maternal Uncle    Stroke Brother    Prostate cancer Father    Esophageal cancer Neg Hx    Rectal cancer Neg Hx    Stomach cancer Neg Hx    Liver cancer Neg Hx    Pancreatic cancer Neg Hx    Colon polyps Neg Hx    Crohn's disease Neg Hx      Social History   Tobacco Use   Smoking status: Former    Years: 6.00    Types: Cigarettes    Quit date: 02/07/2013    Years since quitting: 8.8   Smokeless tobacco: Never  Vaping Use   Vaping Use: Never used  Substance Use Topics   Alcohol use: No    Alcohol/week: 0.0 standard drinks   Drug use: No      ROS Constitutional: Denies fever, chills, weight loss/gain, headaches, insomnia,  night sweats or change in appetite. Does c/o fatigue. Eyes: Denies redness, blurred vision, diplopia, discharge, itchy or watery eyes.  ENT: Denies discharge, congestion, post nasal drip, epistaxis, sore throat, earache, hearing loss, dental pain, Tinnitus, Vertigo, Sinus pain or snoring.  Cardio: Denies chest pain, palpitations, irregular heartbeat, syncope, dyspnea, diaphoresis, orthopnea, PND, claudication or edema Respiratory: denies cough, dyspnea, DOE, pleurisy, hoarseness, laryngitis or wheezing.  Gastrointestinal: Denies dysphagia, heartburn, reflux, water brash, pain, cramps, nausea, vomiting, bloating, diarrhea, constipation, hematemesis, melena, hematochezia, jaundice or hemorrhoids Genitourinary: Denies dysuria, frequency, urgency, nocturia, hesitancy, discharge, hematuria or flank pain Musculoskeletal: Denies arthralgia, myalgia, stiffness, Jt. Swelling, pain, limp or strain/sprain. Denies Falls. Skin: Denies puritis, rash, hives, warts, acne, eczema or change in skin lesion Neuro: No weakness, tremor, incoordination, spasms, paresthesia or pain Psychiatric: Denies confusion, memory loss or sensory loss. Denies Depression. Endocrine: Denies change in  weight, skin, hair change, nocturia, and paresthesia, diabetic polys, visual blurring or hyper / hypo glycemic episodes.  Heme/Lymph: No excessive bleeding, bruising or enlarged lymph nodes.   Physical Exam  There were no vitals taken for this visit.  General Appearance: Well nourished and well groomed and in no apparent distress.  Eyes: PERRLA, EOMs, conjunctiva no swelling or erythema, normal fundi and vessels. Sinuses: No frontal/maxillary tenderness ENT/Mouth: EACs patent / TMs  nl. Nares clear without erythema, swelling, mucoid exudates. Oral hygiene is good. No erythema, swelling, or exudate. Tongue normal, non-obstructing. Tonsils not swollen or erythematous. Hearing normal.  Neck: Supple, thyroid not palpable. No bruits, nodes or JVD. Respiratory: Respiratory effort normal.  BS equal and clear bilateral without rales, rhonci, wheezing or stridor. Cardio: Heart sounds are normal with regular rate and rhythm and no murmurs, rubs or gallops. Peripheral pulses are normal and equal bilaterally without edema. No aortic or femoral bruits. Chest: symmetric with normal excursions and percussion.  Abdomen: Soft, with Nl bowel sounds. Nontender, no guarding, rebound, hernias, masses, or organomegaly.  Lymphatics: Non tender without lymphadenopathy.  Musculoskeletal: Full ROM all peripheral extremities, joint stability, 5/5 strength, and normal gait. Skin: Warm  and dry without rashes, lesions, cyanosis, clubbing or  ecchymosis.  Neuro: Cranial nerves intact, reflexes equal bilaterally. Normal muscle tone, no cerebellar symptoms. Sensation intact.  Pysch: Alert and oriented X 3 with normal affect, insight and judgment appropriate.   Assessment and Plan  1. Annual Preventative/Screening Exam    2. Morbid obesity with BMI of 45.0-49.9, adult  - TSH  3. Essential hypertension  - EKG 12-Lead - [place], RETROPERITNL ABD,  LTD - CBC with Differential/Platelet - COMPLETE METABOLIC PANEL WITH  GFR - Magnesium - TSH  4. Hyperlipidemia, mixed  - EKG 12-Lead - [place], RETROPERITNL ABD,  LTD - Lipid panel - TSH  5. Vitamin D deficiency  - VITAMIN D 25 Hydroxy  6. Insulin resistance  - EKG 12-Lead - [place], RETROPERITNL ABD,  LTD - Lipid panel - TSH - Hemoglobin A1c - Insulin, random  7. Aorto-iliac atherosclerosis (HCC) by CT Scan on 01/08/2018  - EKG 12-Lead - [place], RETROPERITNL ABD,  LTD  8. Gout  - Uric acid  9. Severe obstructive sleep apnea-hypopnea syndrome  - EKG 12-Lead  10. BPH with obstruction/lower urinary tract symptoms  - PSA  11. Abnormal glucose  - Hemoglobin A1c - Insulin, random  12. Testosterone deficiency  - Testosterone  13. Prostate cancer screening  - PSA  14. Screening for heart disease  - EKG 12-Lead  15. FHx: heart disease  - EKG 12-Lead - [place], RETROPERITNL ABD,  LTD  16. Former smoker (10 pack year hx, quit 2014)  - EKG 12-Lead - [place], RETROPERITNL ABD,  LTD  17. Screening for AAA (aortic abdominal aneurysm)  - [place], RETROPERITNL ABD,  LTD  18. Fatigue, unspecified type  - Iron, Total/Total Iron Binding Cap - Vitamin B12  19. Medication management  - Urinalysis, Routine w reflex microscopic - Microalbumin / creatinine urine ratio - Uric acid - CBC with Differential/Platelet - COMPLETE METABOLIC PANEL WITH GFR - Magnesium - Lipid panel - TSH - Hemoglobin A1c - Insulin, random - Testosterone - VITAMIN D 25 Hydroxy           Patient was counseled in prudent diet, weight control to achieve/maintain BMI less than 25, BP monitoring, regular exercise and medications as discussed.  Discussed med effects and SE's. Routine screening labs and tests as requested with regular follow-up as recommended. Over 40 minutes of exam, counseling, chart review and high complex critical decision making was performed   Marinus Maw, MD

## 2022-12-11 ENCOUNTER — Ambulatory Visit (INDEPENDENT_AMBULATORY_CARE_PROVIDER_SITE_OTHER): Payer: BC Managed Care – PPO | Admitting: Internal Medicine

## 2022-12-11 ENCOUNTER — Encounter: Payer: Self-pay | Admitting: Internal Medicine

## 2022-12-11 VITALS — BP 138/88 | HR 81 | Temp 97.9°F | Resp 17 | Ht 72.0 in | Wt 240.0 lb

## 2022-12-11 DIAGNOSIS — E88819 Insulin resistance, unspecified: Secondary | ICD-10-CM

## 2022-12-11 DIAGNOSIS — Z8249 Family history of ischemic heart disease and other diseases of the circulatory system: Secondary | ICD-10-CM

## 2022-12-11 DIAGNOSIS — R5383 Other fatigue: Secondary | ICD-10-CM

## 2022-12-11 DIAGNOSIS — N138 Other obstructive and reflux uropathy: Secondary | ICD-10-CM

## 2022-12-11 DIAGNOSIS — Z136 Encounter for screening for cardiovascular disorders: Secondary | ICD-10-CM | POA: Diagnosis not present

## 2022-12-11 DIAGNOSIS — M109 Gout, unspecified: Secondary | ICD-10-CM

## 2022-12-11 DIAGNOSIS — Z79899 Other long term (current) drug therapy: Secondary | ICD-10-CM

## 2022-12-11 DIAGNOSIS — Z125 Encounter for screening for malignant neoplasm of prostate: Secondary | ICD-10-CM

## 2022-12-11 DIAGNOSIS — E349 Endocrine disorder, unspecified: Secondary | ICD-10-CM

## 2022-12-11 DIAGNOSIS — Z0001 Encounter for general adult medical examination with abnormal findings: Secondary | ICD-10-CM

## 2022-12-11 DIAGNOSIS — E782 Mixed hyperlipidemia: Secondary | ICD-10-CM

## 2022-12-11 DIAGNOSIS — I700 Atherosclerosis of aorta: Secondary | ICD-10-CM

## 2022-12-11 DIAGNOSIS — Z0000 Encounter for general adult medical examination without abnormal findings: Secondary | ICD-10-CM | POA: Diagnosis not present

## 2022-12-11 DIAGNOSIS — G4733 Obstructive sleep apnea (adult) (pediatric): Secondary | ICD-10-CM

## 2022-12-11 DIAGNOSIS — I10 Essential (primary) hypertension: Secondary | ICD-10-CM

## 2022-12-11 DIAGNOSIS — R7309 Other abnormal glucose: Secondary | ICD-10-CM

## 2022-12-11 DIAGNOSIS — Z87891 Personal history of nicotine dependence: Secondary | ICD-10-CM

## 2022-12-11 DIAGNOSIS — E559 Vitamin D deficiency, unspecified: Secondary | ICD-10-CM

## 2022-12-11 LAB — CBC WITH DIFFERENTIAL/PLATELET
HCT: 41.8 % (ref 38.5–50.0)
MCH: 32.7 pg (ref 27.0–33.0)
MCHC: 34.9 g/dL (ref 32.0–36.0)
MCV: 93.7 fL (ref 80.0–100.0)
MPV: 10.9 fL (ref 7.5–12.5)
Platelets: 112 10*3/uL — ABNORMAL LOW (ref 140–400)
RBC: 4.46 10*6/uL (ref 4.20–5.80)
RDW: 12.9 % (ref 11.0–15.0)
Total Lymphocyte: 34.3 %
WBC: 5.4 10*3/uL (ref 3.8–10.8)

## 2022-12-11 MED ORDER — TESTOSTERONE CYPIONATE 200 MG/ML IM SOLN
200.0000 mg | Freq: Once | INTRAMUSCULAR | Status: AC
Start: 2022-12-11 — End: 2022-12-11
  Administered 2022-12-11: 200 mg via INTRAMUSCULAR

## 2022-12-11 NOTE — Progress Notes (Signed)
Patient here for a Testosterone injection 100mg/1ml IM injection in RIGHT upper outer quadrant. Patient tolerated well and will return in 1 week for his next injection    

## 2022-12-12 LAB — COMPLETE METABOLIC PANEL WITH GFR
AG Ratio: 1.9 (calc) (ref 1.0–2.5)
ALT: 10 U/L (ref 9–46)
AST: 14 U/L (ref 10–35)
Albumin: 4.5 g/dL (ref 3.6–5.1)
Alkaline phosphatase (APISO): 50 U/L (ref 35–144)
BUN: 15 mg/dL (ref 7–25)
CO2: 23 mmol/L (ref 20–32)
Calcium: 9.4 mg/dL (ref 8.6–10.3)
Chloride: 107 mmol/L (ref 98–110)
Creat: 1.35 mg/dL (ref 0.70–1.35)
Globulin: 2.4 g/dL (calc) (ref 1.9–3.7)
Glucose, Bld: 79 mg/dL (ref 65–139)
Potassium: 3.9 mmol/L (ref 3.5–5.3)
Sodium: 140 mmol/L (ref 135–146)
Total Bilirubin: 0.9 mg/dL (ref 0.2–1.2)
Total Protein: 6.9 g/dL (ref 6.1–8.1)
eGFR: 58 mL/min/{1.73_m2} — ABNORMAL LOW (ref >=60)

## 2022-12-12 LAB — MICROALBUMIN / CREATININE URINE RATIO
Creatinine, Urine: 59 mg/dL (ref 20–320)
Microalb, Ur: <0.2 mg/dL

## 2022-12-12 LAB — VITAMIN B12: Vitamin B-12: 509 pg/mL (ref 200–1100)

## 2022-12-12 LAB — PSA: PSA: 0.9 ng/mL (ref <=4.00)

## 2022-12-12 LAB — CBC WITH DIFFERENTIAL/PLATELET
Absolute Monocytes: 626 cells/uL (ref 200–950)
Basophils Absolute: 22 cells/uL (ref 0–200)
Basophils Relative: 0.4 %
Eosinophils Absolute: 108 cells/uL (ref 15–500)
Eosinophils Relative: 2 %
Hemoglobin: 14.6 g/dL (ref 13.2–17.1)
Lymphs Abs: 1852 cells/uL (ref 850–3900)
Monocytes Relative: 11.6 %
Neutro Abs: 2792 cells/uL (ref 1500–7800)
Neutrophils Relative %: 51.7 %

## 2022-12-12 LAB — TESTOSTERONE: Testosterone: 876 ng/dL — ABNORMAL HIGH (ref 250–827)

## 2022-12-12 LAB — URINALYSIS, ROUTINE W REFLEX MICROSCOPIC
Bilirubin Urine: NEGATIVE
Glucose, UA: NEGATIVE
Hgb urine dipstick: NEGATIVE
Ketones, ur: NEGATIVE
Leukocytes,Ua: NEGATIVE
Nitrite: NEGATIVE
Protein, ur: NEGATIVE
Specific Gravity, Urine: 1.009 (ref 1.001–1.035)
pH: 6 (ref 5.0–8.0)

## 2022-12-12 LAB — LIPID PANEL
Cholesterol: 133 mg/dL (ref <200)
HDL: 53 mg/dL (ref >=40)
LDL Cholesterol (Calc): 68 mg/dL (calc)
Non-HDL Cholesterol (Calc): 80 mg/dL (calc) (ref <130)
Total CHOL/HDL Ratio: 2.5 (calc) (ref <5.0)
Triglycerides: 43 mg/dL (ref <150)

## 2022-12-12 LAB — HEMOGLOBIN A1C
Hgb A1c MFr Bld: 5 % of total Hgb (ref <5.7)
Mean Plasma Glucose: 97 mg/dL
eAG (mmol/L): 5.4 mmol/L

## 2022-12-12 LAB — MAGNESIUM: Magnesium: 1.9 mg/dL (ref 1.5–2.5)

## 2022-12-12 LAB — INSULIN, RANDOM: Insulin: 6.1 u[IU]/mL

## 2022-12-12 LAB — IRON, TOTAL/TOTAL IRON BINDING CAP
%SAT: 46 % (calc) (ref 20–48)
Iron: 133 ug/dL (ref 50–180)
TIBC: 291 mcg/dL (calc) (ref 250–425)

## 2022-12-12 LAB — VITAMIN D 25 HYDROXY (VIT D DEFICIENCY, FRACTURES): Vit D, 25-Hydroxy: 97 ng/mL (ref 30–100)

## 2022-12-12 LAB — URIC ACID: Uric Acid, Serum: 7 mg/dL (ref 4.0–8.0)

## 2022-12-12 LAB — TSH: TSH: 1.52 mIU/L (ref 0.40–4.50)

## 2022-12-14 NOTE — Progress Notes (Signed)
^<^<^<^<^<^<^<^<^<^<^<^<^<^<^<^<^<^<^<^<^<^<^<^<^<^<^<^<^<^<^<^<^<^<^<^<^ ^>^>^>^>^>^>^>^>^>^>^>>^>^>^>^>^>^>^>^>^>^>^>^>^>^>^>^>^>^>^>^>^>^>^>^>^ -  Test results slightly outside the reference range are not unusual. If there is anything important, I will review this with you,  otherwise it is considered normal test values.  If you have further questions,  please do not hesitate to contact me at the office or via My Chart.  ^<^<^<^<^<^<^<^<^<^<^<^<^<^<^<^<^<^<^<^<^<^<^<^<^<^<^<^<^<^<^<^<^<^<^<^<^ ^>^>^>^>^>^>^>^>^>^>^>^>^>^>^>^>^>^>^>^>^>^>^>^>^>^>^>^>^>^>^>^>^>^>^>^>^  -  Kidney functions - Stage 3a & Stable   -  Still  Very important to                                  drink adequate amounts of fluids to prevent permanent damage    - Recommend drink at least                                          6 bottles (16 ounces) of fluids /water /day = 96 Oz ~100 oz  - 100 oz = 3,000 cc or 3 liters / day                                                 - >> That's 1 &1/2 bottles of a 2 liter soda bottle /day !   ^<^<^<^<^<^<^<^<^<^<^<^<^<^<^<^<^<^<^<^<^<^<^<^<^<^<^<^<^<^<^<^<^<^<^<^<^ ^>^>^>^>^>^>^>^>^>^>^>^>^>^>^>^>^>^>^>^>^>^>^>^>^>^>^>^>^>^>^>^>^>^>^>^>^  -  Testosterone - Normal after shot  ^<^<^<^<^<^<^<^<^<^<^<^<^<^<^<^<^<^<^<^<^<^<^<^<^<^<^<^<^<^<^<^<^<^<^<^<^ ^>^>^>^>^>^>^>^>^>^>^>^>^>^>^>^>^>^>^>^>^>^>^>^>^>^>^>^>^>^>^>^>^>^>^>^>^  - Iron   &    Vit B12 levels Normal & OK  ^<^<^<^<^<^<^<^<^<^<^<^<^<^<^<^<^<^<^<^<^<^<^<^<^<^<^<^<^<^<^<^<^<^<^<^<^ ^>^>^>^>^>^>^>^>^>^>^>^>^>^>^>^>^>^>^>^>^>^>^>^>^>^>^>^>^>^>^>^>^>^>^>^>^  - Uric Acid / Gout test  -  Normal & OK  - Continue Allopurinol  ^<^<^<^<^<^<^<^<^<^<^<^<^<^<^<^<^<^<^<^<^<^<^<^<^<^<^<^<^<^<^<^<^<^<^<^<^ ^>^>^>^>^>^>^>^>^>^>^>^>^>^>^>^>^>^>^>^>^>^>^>^>^>^>^>^>^>^>^>^>^>^>^>^>^  -  Total Chol  133  &   LDL   68  - Both  Excellent   - Very low risk for Heart Attack  /  Stroke ^<^<^<^<^<^<^<^<^<^<^<^<^<^<^<^<^<^<^<^<^<^<^<^<^<^<^<^<^<^<^<^<^<^<^<^<^ ^>^>^>^>^>^>^>^>^>^>^>^>^>^>^>^>^>^>^>^>^>^>^>^>^>^>^>^>^>^>^>^>^>^>^>^>^  -  A1c - Normal - No Diabetes any more  !  ^<^<^<^<^<^<^<^<^<^<^<^<^<^<^<^<^<^<^<^<^<^<^<^<^<^<^<^<^<^<^<^<^<^<^<^<^ ^>^>^>^>^>^>^>^>^>^>^>^>^>^>^>^>^>^>^>^>^>^>^>^>^>^>^>^>^>^>^>^>^>^>^>^>^  - Vit D =97  - Excellent - Please continue dosage same  ^<^<^<^<^<^<^<^<^<^<^<^<^<^<^<^<^<^<^<^<^<^<^<^<^<^<^<^<^<^<^<^<^<^<^<^<^ ^>^>^>^>^>^>^>^>^>^>^>^>^>^>^>^>^>^>^>^>^>^>^>^>^>^>^>^>^>^>^>^>^>^>^>^>^  - PSA - very Low - No Prostate Cancer   - Great  ! ^<^<^<^<^<^<^<^<^<^<^<^<^<^<^<^<^<^<^<^<^<^<^<^<^<^<^<^<^<^<^<^<^<^<^<^<^ ^>^>^>^>^>^>^>^>^>^>^>^>^>^>^>^>^>^>^>^>^>^>^>^>^>^>^>^>^>^>^>^>^>^>^>^>^  -All Else - CBC - Kidneys - Electrolytes - Liver - Magnesium & Thyroid    - all  Normal / OK  ^<^<^<^<^<^<^<^<^<^<^<^<^<^<^<^<^<^<^<^<^<^<^<^<^<^<^<^<^<^<^<^<^<^<^<^<^ ^>^>^>^>^>^>^>^>^>^>^>^>^>^>^>^>^>^>^>^>^>^>^>^>^>^>^>^>^>^>^>^>^>^>^>^>^

## 2022-12-16 ENCOUNTER — Ambulatory Visit: Payer: BC Managed Care – PPO

## 2022-12-18 ENCOUNTER — Ambulatory Visit (INDEPENDENT_AMBULATORY_CARE_PROVIDER_SITE_OTHER): Payer: BC Managed Care – PPO

## 2022-12-18 VITALS — BP 124/82 | HR 87 | Temp 97.9°F | Ht 72.0 in | Wt 240.8 lb

## 2022-12-18 DIAGNOSIS — E349 Endocrine disorder, unspecified: Secondary | ICD-10-CM | POA: Diagnosis not present

## 2022-12-18 MED ORDER — TESTOSTERONE CYPIONATE 200 MG/ML IM SOLN
200.0000 mg | Freq: Once | INTRAMUSCULAR | Status: AC
Start: 2022-12-18 — End: 2022-12-18
  Administered 2022-12-18: 200 mg via INTRAMUSCULAR

## 2022-12-18 NOTE — Progress Notes (Signed)
Patient here for a Testosterone injection 100mg/1ml IM injection in LEFT upper outer quadrant. Patient tolerated well and will return in 1 week for his next injection   

## 2022-12-23 ENCOUNTER — Ambulatory Visit: Payer: BC Managed Care – PPO

## 2022-12-25 ENCOUNTER — Ambulatory Visit (INDEPENDENT_AMBULATORY_CARE_PROVIDER_SITE_OTHER): Payer: BC Managed Care – PPO

## 2022-12-25 VITALS — BP 116/80 | HR 91 | Temp 98.5°F | Ht 72.0 in | Wt 242.0 lb

## 2022-12-25 DIAGNOSIS — E349 Endocrine disorder, unspecified: Secondary | ICD-10-CM

## 2022-12-25 MED ORDER — TESTOSTERONE CYPIONATE 200 MG/ML IM SOLN
200.0000 mg | Freq: Once | INTRAMUSCULAR | Status: AC
Start: 2022-12-25 — End: 2022-12-25
  Administered 2022-12-25: 200 mg via INTRAMUSCULAR

## 2022-12-25 NOTE — Progress Notes (Signed)
Patient here for a Testosterone injection /51ml IM injection in RIGHT upper outer quadrant. Patient tolerated well.

## 2022-12-30 ENCOUNTER — Ambulatory Visit: Payer: BC Managed Care – PPO

## 2023-01-04 ENCOUNTER — Other Ambulatory Visit: Payer: Self-pay | Admitting: Internal Medicine

## 2023-01-04 DIAGNOSIS — I10 Essential (primary) hypertension: Secondary | ICD-10-CM

## 2023-01-20 ENCOUNTER — Encounter: Payer: Self-pay | Admitting: Gastroenterology

## 2023-01-31 ENCOUNTER — Other Ambulatory Visit: Payer: Self-pay | Admitting: Nurse Practitioner

## 2023-01-31 DIAGNOSIS — K219 Gastro-esophageal reflux disease without esophagitis: Secondary | ICD-10-CM

## 2023-02-12 ENCOUNTER — Encounter: Payer: Self-pay | Admitting: Gastroenterology

## 2023-03-09 ENCOUNTER — Ambulatory Visit (AMBULATORY_SURGERY_CENTER): Payer: BC Managed Care – PPO | Admitting: *Deleted

## 2023-03-09 VITALS — Ht 72.0 in | Wt 235.0 lb

## 2023-03-09 DIAGNOSIS — Z86010 Personal history of colonic polyps: Secondary | ICD-10-CM

## 2023-03-09 MED ORDER — PEG 3350-KCL-NA BICARB-NACL 420 G PO SOLR
4000.00 mL | Freq: Once | ORAL | 0 refills | Status: AC
Start: 2023-03-09 — End: 2023-03-09

## 2023-03-09 NOTE — Progress Notes (Signed)
Pt's name and DOB verified at the beginning of the pre-visit.  Pt denies any difficulty with ambulating,sitting, laying down or rolling side to side Gave both [facility] main # and MD on call # prior to instructions.  No egg or soy allergy known to patient  No issues known to pt with past sedation with any surgeries or procedures Pt denies having issues being intubated Pt has no issues moving head neck or swallowing No FH of Malignant Hyperthermia Pt is not on diet pills Pt is not on home 02  Pt is not on blood thinners  Pt is not on dialysis Pt denise any abnormal heart rhythms  Pt denies any upcoming cardiac testing Pt encouraged to use to use Singlecare or Goodrx to reduce cost  Patient's chart reviewed by Cathlyn Parsons CNRA prior to pre-visit and patient appropriate for the [facility].  Pre-visit completed and red dot placed by patient's name on their procedure day (on provider's schedule).  . Visit by phone Pt states weight is 235 lb Instructed pt why it is important to and  to call if they have any changes in health or new medications. Directed them to the # given and on instructions.   Pt states they will.  Instructions reviewed with pt and pt states understanding. Instructed to review again prior to procedure. Pt states they will.  Instructions sent by mail and by my chart

## 2023-03-11 ENCOUNTER — Encounter: Payer: Self-pay | Admitting: Gastroenterology

## 2023-03-12 ENCOUNTER — Ambulatory Visit: Payer: BC Managed Care – PPO | Admitting: Nurse Practitioner

## 2023-03-13 ENCOUNTER — Ambulatory Visit (INDEPENDENT_AMBULATORY_CARE_PROVIDER_SITE_OTHER): Payer: BC Managed Care – PPO | Admitting: Nurse Practitioner

## 2023-03-13 ENCOUNTER — Encounter: Payer: Self-pay | Admitting: Nurse Practitioner

## 2023-03-13 VITALS — BP 118/82 | HR 68 | Temp 97.6°F | Wt 231.4 lb

## 2023-03-13 DIAGNOSIS — N401 Enlarged prostate with lower urinary tract symptoms: Secondary | ICD-10-CM

## 2023-03-13 DIAGNOSIS — R300 Dysuria: Secondary | ICD-10-CM

## 2023-03-13 DIAGNOSIS — M1A9XX0 Chronic gout, unspecified, without tophus (tophi): Secondary | ICD-10-CM

## 2023-03-13 DIAGNOSIS — R350 Frequency of micturition: Secondary | ICD-10-CM

## 2023-03-13 DIAGNOSIS — F419 Anxiety disorder, unspecified: Secondary | ICD-10-CM

## 2023-03-13 DIAGNOSIS — Z79899 Other long term (current) drug therapy: Secondary | ICD-10-CM

## 2023-03-13 DIAGNOSIS — E6609 Other obesity due to excess calories: Secondary | ICD-10-CM

## 2023-03-13 DIAGNOSIS — Z6835 Body mass index (BMI) 35.0-35.9, adult: Secondary | ICD-10-CM

## 2023-03-13 DIAGNOSIS — I10 Essential (primary) hypertension: Secondary | ICD-10-CM

## 2023-03-13 DIAGNOSIS — E349 Endocrine disorder, unspecified: Secondary | ICD-10-CM

## 2023-03-13 DIAGNOSIS — E782 Mixed hyperlipidemia: Secondary | ICD-10-CM

## 2023-03-13 DIAGNOSIS — N138 Other obstructive and reflux uropathy: Secondary | ICD-10-CM

## 2023-03-13 DIAGNOSIS — K219 Gastro-esophageal reflux disease without esophagitis: Secondary | ICD-10-CM

## 2023-03-13 MED ORDER — CIPROFLOXACIN HCL 250 MG PO TABS
ORAL_TABLET | ORAL | 0 refills | Status: DC
Start: 2023-03-13 — End: 2023-03-21

## 2023-03-13 NOTE — Progress Notes (Signed)
FOLLOW UP  Assessment and Plan:   Essential hypertension Discussed DASH (Dietary Approaches to Stop Hypertension) DASH diet is lower in sodium than a typical American diet. Cut back on foods that are high in saturated fat, cholesterol, and trans fats. Eat more whole-grain foods, fish, poultry, and nuts Remain active and exercise as tolerated daily.  Monitor BP at home-Call if greater than 130/80.  Check CMP/CBC  - CBC with Differential/Platelet - COMPLETE METABOLIC PANEL WITH GFR  Hyperlipidemia, mixed Discussed lifestyle modifications. Recommended diet heavy in fruits and veggies, omega 3's. Decrease consumption of animal meats, cheeses, and dairy products. Remain active and exercise as tolerated. Continue to monitor. Check lipids/TSH  - Lipid panel  BPH with obstruction/lower urinary tract symptoms Due to associated dysuria will start treatment for prostatitis and check UA w/Cx to assess for any underlying bacterial infection. PSA levels WNL 12/2022. Continue Terazosin. Continue to monitor  Gastroesophageal reflux disease, unspecified whether esophagitis present No suspected reflux complications (Barret/stricture). Lifestyle modification:  wt loss, avoid meals 2-3h before bedtime. Consider eliminating food triggers:  chocolate, caffeine, EtOH, acid/spicy food.   Testosterone deficiency Stopped injections 12/2022 Continue to monitor  - Testosterone  Anxiety Continue medications Wellbutrin - no longer needing Xanax.   Reviewed relaxation techniques.  Sleep hygiene. Recommended Cognitive Behavioral Therapy (CBT). Recommended mindfulness meditation and exercise.   Insight-oriented psychotherapy given for 16 minutes exclusively. Psychoeducation:  encouraged personality growth wand development through coping techniques and problem-solving skills. Limit/Decrease/Monitor drug/alcohol intake.     Class 2 obesity due to excess calories without serious comorbidity with body  mass index (BMI) of 35.0 to 35.9 in adult Weight continuing to trend down Discussed appropriate BMI Diet modification. Physical activity. Encouraged/praised to build confidence.  Medication management All medications discussed and reviewed in full. All questions and concerns regarding medications addressed.    - CBC with Differential/Platelet - COMPLETE METABOLIC PANEL WITH GFR - Lipid panel - Testosterone  Urinary frequency/dysuria  - Urinalysis, Routine w reflex microscopic - Urine Culture  Gout No recent flare Stop Allopurinol Discussed low purine diet.   Orders Placed This Encounter  Procedures   Urine Culture   CBC with Differential/Platelet   COMPLETE METABOLIC PANEL WITH GFR   Lipid panel   Testosterone   Urinalysis, Routine w reflex microscopic   Meds ordered this encounter  Medications   ciprofloxacin (CIPRO) 250 MG tablet    Sig: Take 1 tablet 2 x /day with Food for Infection    Dispense:  20 tablet    Refill:  0    Order Specific Question:   Supervising Provider    Answer:   Lucky Cowboy [phone]    Notify office for further evaluation and treatment, questions or concerns if any reported s/s fail to improve.   The patient was advised to call back or seek an in-person evaluation if any symptoms worsen or if the condition fails to improve as anticipated.   Further disposition pending results of labs. Discussed med's effects and SE's.    I discussed the assessment and treatment plan with the patient. The patient was provided an opportunity to ask questions and all were answered. The patient agreed with the plan and demonstrated an understanding of the instructions.  Discussed med's effects and SE's. Screening labs and tests as requested with regular follow-up as recommended.  I provided 25 minutes of face-to-face time during this encounter including counseling, chart review, and critical decision making was preformed.  Today's Plan of Care is based  on  a patient-centered health care approach known as shared decision making - the decisions, tests and treatments allow for patient preferences and values to be balanced with clinical evidence.    Future Appointments  Date Time Provider Department Center  03/19/2023  2:30 PM Danis, Andreas Blower, MD [facility]    ----------------------------------------------------------------------------------------------------------------------  HPI 67 y.o. male  presents for 3 month follow up on hypertension, cholesterol, diabetes, weight and vitamin D deficiency.   Overall he reports feeling well today.  He does note a hx of BPH currently treated with Terazosin.  Last PSA check WNL 12/2022. Feels over the last three months he has noticed increase in pain with urination accompanied by urinary frequency, hesitancy and dribbling.    BMI is Body mass index is 31.38 kg/m., he has been working on diet and exercise. Wt Readings from Last 3 Encounters:  03/13/23 231 lb 6.4 oz (105 kg)  03/09/23 235 lb (106.6 kg)  12/25/22 242 lb (109.8 kg)    His blood pressure has been controlled at home, today their BP is BP: 118/82  He does workout. He denies chest pain, shortness of breath, dizziness.   He is not on cholesterol medication. His cholesterol is at goal. The cholesterol last visit was:   Lab Results  Component Value Date   CHOL 133 12/11/2022   HDL 53 12/11/2022   LDLCALC 68 12/11/2022   TRIG 43 12/11/2022   CHOLHDL 2.5 12/11/2022    He has been working on diet and exercise for prediabetes, and denies polydipsia and polyuria. Last A1C in the office was:  Lab Results  Component Value Date   HGBA1C 5.0 12/11/2022   Patient is on Vitamin D supplement.   Lab Results  Component Value Date   VD25OH 17 12/11/2022     States that he has been taking Allopurinol for may years due to elevated uric acid levels.  He reports never having a flare.  Kidney function is declining slowly.  He is receptive to  stopping.  Lab Results  Component Value Date   EGFR 58 (L) 12/11/2022   He has a hx of testosterone deficiency.  Stopped injections 12/2022. 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   atenolol (TENORMIN) 100 MG tablet Take  1 tablet  Daily  for BP                                              /  TAKE                              BY                               MOUTH   buPROPion (WELLBUTRIN XL) 300 MG 24 hr tablet Take  1 tablet  Daily for Mood, Focus & concentration                               /                               TAKE                          BY                  MOUTH   Cholecalciferol (VITAMIN D3) 125 MCG (5000 UT) CAPS Take  2 capsules (10,000 u)  every day   Cyanocobalamin (VITAMIN B 12 PO) Take 1 tablet by mouth daily.   cyclobenzaprine (FLEXERIL) 10 MG tablet Take  1/2 to 1 tablet  3 x /day   as needed for Muscle Spasm   Dulaglutide (TRULICITY) 4.5 MG/0.5ML SOPN Inject 4.5 mg into skin every 7 days  for Diabetes  ( Dx: e11.29 )   esomeprazole (NEXIUM) 40 MG capsule Take  1 capsule   2 x /day  to Prevent Heartburn / Barrett's Esophagitis                                         /                                                    TAKE                                            BY                                              MOUTH   EUCRISA 2 % OINT APPLY 1 APPLICATION TOPICALLY TWO TIMES A DAY (Patient taking differently: as needed.)   fenofibrate 160 MG tablet Take 1 tablet Daily for  Triglycerides (Blood Fats)                                                          /                             TAKE  BY                                MOUTH   fish oil-omega-3 fatty acids 1000 MG capsule Take 1 g by mouth daily.   Flaxseed, Linseed, (FLAX SEEDS PO) Take by mouth.   magnesium oxide (MAG-OX) 400 MG tablet Take 400 mg by mouth 2 (two) times daily.    meloxicam (MOBIC) 15 MG tablet Take 15 mg by mouth daily.   montelukast (SINGULAIR) 10 MG tablet Take  1 tablet  Daily  for Allergies                             /                                                   TAKE                          BY                                  MOUTH   Multiple Vitamin (MULTIVITAMIN WITH MINERALS) TABS tablet Take 1 tablet by mouth daily.   OVER THE COUNTER MEDICATION daily. Sea moss  with beet root-take 2 capsuls   phentermine (ADIPEX-P) 37.5 MG tablet Take 1/2 to 1 tablet every Morning for Dieting & Weight Loss   potassium chloride (KLOR-CON) 10 MEQ tablet TAKE 1 TABLET BY MOUTH 3  TIMES DAILY   terazosin (HYTRIN) 2 MG capsule Take  1 capsule at Bedtime  for BP & Prostate                                         /                                TAKE                                     BY                           MOUTH   topiramate (TOPAMAX) 100 MG tablet Take 1/2 to 1 tablet 2 x /day at Suppertime & Bedtime for Dieting & Weight Loss   torsemide (DEMADEX) 20 MG tablet Take  1 tablet  2 x /day  am & pm  for severe leg swelling   Turmeric (QC TUMERIC COMPLEX PO) Take by mouth.   valsartan (DIOVAN) 320 MG tablet Take 1 tablet Daily for BP & Diabetic Kidney Protection                                                                   /  TAKE                                                BY                                                      MOUTH   vitamin C (ASCORBIC ACID) 500 MG tablet Take 500 mg by mouth daily.    Vitamin  D, Ergocalciferol, (DRISDOL) 1.25 MG (50000 UNIT) CAPS capsule Take by mouth.   Zinc 50 MG TABS Take by mouth daily.   ALPRAZolam (XANAX) 1 MG tablet Take 1/2 - 1 tablet 2 - 3 x /day ONLY if needed for Panic or Severe Anxiety Attack &  limit to 5 days /week to avoid Addiction & Dementia                                                 /                                    TAKE                                  BY                                 MOUTH (Patient not taking: Reported on 03/09/2023)   testosterone cypionate (DEPOTESTOSTERONE CYPIONATE) 200 MG/ML injection INJECT 1 MILLILITER (CC) INTO THE MUSCLE ONCE A WEEK (Patient not taking: Reported on 03/09/2023)   traZODone (DESYREL) 150 MG tablet TAKE 1/2 TO 1 TABLET BY MOUTH 1 TO 2 HOURS BEFORE BEDTIME AS NEEDED FOR SLEEP (Patient not taking: Reported on 03/09/2023)   No current facility-administered medications on file prior to visit.     Allergies: No Known Allergies   Medical History:  Past Medical History:  Diagnosis Date   Allergy    Anxiety    Asthma    as a child   Barrett's esophagus    Cataract    early stages left   Closed fracture of nasal septum 06/02/2016   GERD (gastroesophageal reflux disease)    Gout    never had a flare up of gout   H/O hiatal hernia    Hyperlipidemia    Hypertension    Prediabetes    Sleep apnea    does not use  cpap   Vitamin D deficiency    Family history- Reviewed and unchanged Social history- Reviewed and unchanged   Review of Systems:  ROS    Physical Exam: BP 118/82   Pulse 68   Temp 97.6 F (36.4 C)   Wt 231 lb 6.4 oz (105 kg)   SpO2 98%   BMI 31.38 kg/m  Wt Readings from Last 3 Encounters:  03/13/23 231 lb 6.4 oz (105 kg)  03/09/23 235 lb (106.6 kg)  12/25/22 242  lb (109.8 kg)   General Appearance: Well nourished, in no apparent distress. Eyes: PERRLA, EOMs, conjunctiva no swelling or erythema Sinuses: No Frontal/maxillary tenderness ENT/Mouth: Ext aud canals clear, TMs  without erythema, bulging. No erythema, swelling, or exudate on post pharynx.  Tonsils not swollen or erythematous. Hearing normal.  Neck: Supple, thyroid normal.  Respiratory: Respiratory effort normal, BS equal bilaterally without rales, rhonchi, wheezing or stridor.  Cardio: RRR with no MRGs. Brisk peripheral pulses without edema.  Abdomen: Soft, + BS.  Non tender, no guarding, rebound, hernias, masses. Lymphatics: Non tender without lymphadenopathy.  Musculoskeletal: Full ROM, 5/5 strength, Normal gait Skin: Warm, dry without rashes, lesions, ecchymosis.  Neuro: Cranial nerves intact. No cerebellar symptoms.  Psych: Awake and oriented X 3, normal affect, Insight and Judgment appropriate.    Adela Glimpse, NP 10:18 AM [facility]

## 2023-03-13 NOTE — Patient Instructions (Signed)

## 2023-03-14 LAB — COMPLETE METABOLIC PANEL WITH GFR
AG Ratio: 1.8 (calc) (ref 1.0–2.5)
ALT: 15 U/L (ref 9–46)
AST: 17 U/L (ref 10–35)
Albumin: 4.2 g/dL (ref 3.6–5.1)
Alkaline phosphatase (APISO): 51 U/L (ref 35–144)
BUN/Creatinine Ratio: 16 (calc) (ref 6–22)
BUN: 23 mg/dL (ref 7–25)
CO2: 27 mmol/L (ref 20–32)
Calcium: 9 mg/dL (ref 8.6–10.3)
Chloride: 106 mmol/L (ref 98–110)
Creat: 1.46 mg/dL — ABNORMAL HIGH (ref 0.70–1.35)
Globulin: 2.4 g/dL (calc) (ref 1.9–3.7)
Glucose, Bld: 89 mg/dL (ref 65–99)
Potassium: 3.8 mmol/L (ref 3.5–5.3)
Sodium: 140 mmol/L (ref 135–146)
Total Bilirubin: 0.6 mg/dL (ref 0.2–1.2)
Total Protein: 6.6 g/dL (ref 6.1–8.1)
eGFR: 53 mL/min/{1.73_m2} — ABNORMAL LOW (ref >=60)

## 2023-03-14 LAB — URINALYSIS, ROUTINE W REFLEX MICROSCOPIC
Bilirubin Urine: NEGATIVE
Glucose, UA: NEGATIVE
Hgb urine dipstick: NEGATIVE
Ketones, ur: NEGATIVE
Leukocytes,Ua: NEGATIVE
Nitrite: NEGATIVE
Protein, ur: NEGATIVE
Specific Gravity, Urine: 1.015 (ref 1.001–1.035)
pH: 8 (ref 5.0–8.0)

## 2023-03-14 LAB — LIPID PANEL
Cholesterol: 122 mg/dL (ref <200)
HDL: 49 mg/dL (ref >=40)
LDL Cholesterol (Calc): 61 mg/dL (calc)
Non-HDL Cholesterol (Calc): 73 mg/dL (calc) (ref <130)
Total CHOL/HDL Ratio: 2.5 (calc) (ref <5.0)
Triglycerides: 43 mg/dL (ref <150)

## 2023-03-14 LAB — CBC WITH DIFFERENTIAL/PLATELET
Absolute Monocytes: 683 cells/uL (ref 200–950)
Basophils Absolute: 28 cells/uL (ref 0–200)
Basophils Relative: 0.5 %
Eosinophils Absolute: 140 cells/uL (ref 15–500)
Eosinophils Relative: 2.5 %
HCT: 36.7 % — ABNORMAL LOW (ref 38.5–50.0)
Hemoglobin: 12.6 g/dL — ABNORMAL LOW (ref 13.2–17.1)
Lymphs Abs: 1663 cells/uL (ref 850–3900)
MCH: 31.8 pg (ref 27.0–33.0)
MCHC: 34.3 g/dL (ref 32.0–36.0)
MCV: 92.7 fL (ref 80.0–100.0)
MPV: 11 fL (ref 7.5–12.5)
Monocytes Relative: 12.2 %
Neutro Abs: 3086 cells/uL (ref 1500–7800)
Neutrophils Relative %: 55.1 %
Platelets: 92 10*3/uL — ABNORMAL LOW (ref 140–400)
RBC: 3.96 10*6/uL — ABNORMAL LOW (ref 4.20–5.80)
RDW: 12.3 % (ref 11.0–15.0)
Total Lymphocyte: 29.7 %
WBC: 5.6 10*3/uL (ref 3.8–10.8)

## 2023-03-14 LAB — URINE CULTURE
MICRO NUMBER:: 15194496
Result:: NO GROWTH
SPECIMEN QUALITY:: ADEQUATE

## 2023-03-14 LAB — TESTOSTERONE: Testosterone: 439 ng/dL (ref 250–827)

## 2023-03-17 ENCOUNTER — Other Ambulatory Visit: Payer: BC Managed Care – PPO

## 2023-03-17 ENCOUNTER — Other Ambulatory Visit: Payer: Self-pay | Admitting: Nurse Practitioner

## 2023-03-17 ENCOUNTER — Encounter: Payer: Self-pay | Admitting: Nurse Practitioner

## 2023-03-17 DIAGNOSIS — D649 Anemia, unspecified: Secondary | ICD-10-CM

## 2023-03-18 LAB — IRON,TIBC AND FERRITIN PANEL
%SAT: 48 % (calc) (ref 20–48)
Ferritin: 207 ng/mL (ref 24–380)
Iron: 125 ug/dL (ref 50–180)
TIBC: 261 mcg/dL (calc) (ref 250–425)

## 2023-03-19 ENCOUNTER — Ambulatory Visit (AMBULATORY_SURGERY_CENTER): Payer: BC Managed Care – PPO | Admitting: Gastroenterology

## 2023-03-19 ENCOUNTER — Encounter: Payer: Self-pay | Admitting: Gastroenterology

## 2023-03-19 VITALS — BP 145/88 | HR 67 | Temp 96.6°F | Resp 16 | Ht 72.0 in | Wt 235.0 lb

## 2023-03-19 DIAGNOSIS — D123 Benign neoplasm of transverse colon: Secondary | ICD-10-CM | POA: Diagnosis not present

## 2023-03-19 DIAGNOSIS — K5140 Inflammatory polyps of colon without complications: Secondary | ICD-10-CM

## 2023-03-19 DIAGNOSIS — Z09 Encounter for follow-up examination after completed treatment for conditions other than malignant neoplasm: Secondary | ICD-10-CM | POA: Diagnosis not present

## 2023-03-19 DIAGNOSIS — Z86010 Personal history of colonic polyps: Secondary | ICD-10-CM

## 2023-03-19 DIAGNOSIS — D122 Benign neoplasm of ascending colon: Secondary | ICD-10-CM | POA: Diagnosis not present

## 2023-03-19 MED ORDER — SODIUM CHLORIDE 0.9 % IV SOLN: 500.0000 mL | INTRAVENOUS | Status: DC

## 2023-03-19 NOTE — Progress Notes (Signed)
Sedate, gd SR's, VSS, report to RN 

## 2023-03-19 NOTE — Patient Instructions (Signed)
Christopher [name] may resume your previous medications as ordered today.  Read the handouts given to you by your recovery room nurse.  YOU HAD AN ENDOSCOPIC PROCEDURE TODAY AT THE [facility]:   Refer to the procedure report that was given to you for any specific questions about what was found during the examination.  If the procedure report does not answer your questions, please call your gastroenterologist to clarify.  If you requested that your care partner not be given the details of your procedure findings, then the procedure report has been included in a sealed envelope for you to review at your convenience later.  YOU SHOULD EXPECT: Some feelings of bloating in the abdomen. Passage of more gas than usual.  Walking can help get rid of the air that was put into your GI tract during the procedure and reduce the bloating. If you had a lower endoscopy (such as a colonoscopy or flexible sigmoidoscopy) you may notice spotting of blood in your stool or on the toilet paper. If you underwent a bowel prep for your procedure, you may not have a normal bowel movement for a few days.  Please Note:  You might notice some irritation and congestion in your nose or some drainage.  This is from the oxygen used during your procedure.  There is no need for concern and it should clear up in a day or so.  SYMPTOMS TO REPORT IMMEDIATELY:  Following lower endoscopy (colonoscopy or flexible sigmoidoscopy):  Excessive amounts of blood in the stool  Significant tenderness or worsening of abdominal pains  Swelling of the abdomen that is new, acute  Fever of 100F or higher  For urgent or emergent issues, a gastroenterologist can be reached at any hour by calling [phone]. Do not use MyChart messaging for urgent concerns.    DIET:  We do recommend a small meal at first, but then you may proceed to your regular diet.  Drink plenty of fluids but you should avoid alcoholic beverages for 24 hours.  ACTIVITY:  You  should plan to take it easy for the rest of today and you should NOT DRIVE or use heavy machinery until tomorrow (because of the sedation medicines used during the test).    FOLLOW UP: Our staff will call the number listed on your records the next business day following your procedure.  We will call around 7:15- 8:00 am to check on you and address any questions or concerns that you may have regarding the information given to you following your procedure. If we do not reach you, we will leave a message.     If any biopsies were taken you will be contacted by phone or by letter within the next 1-3 weeks.  Please call us at [phone] if you have not heard about the biopsies in 3 weeks.    SIGNATURES/CONFIDENTIALITY: You and/or your care partner have signed paperwork which will be entered into your electronic medical record.  These signatures attest to the fact that that the information above on your After Visit Summary has been reviewed and is understood.  Full responsibility of the confidentiality of this discharge information lies with you and/or your care-partner.

## 2023-03-19 NOTE — Progress Notes (Signed)
Called to room to assist during endoscopic procedure.  Patient ID and intended procedure confirmed with present staff. Received instructions for my participation in the procedure from the performing physician.  

## 2023-03-19 NOTE — Progress Notes (Signed)
History and Physical:  This patient presents for endoscopic testing for: Encounter Diagnosis  Name Primary?   Personal history of colonic polyps Yes   Surveillance for Hx colon polyps 12mm SSP June 2023 - fair prep (Suprep.Miralax) Poor prep Oct 2020 Patient denies chronic abdominal pain, rectal bleeding, constipation or diarrhea.    Patient is otherwise without complaints or active issues today.   Past Medical History: Past Medical History:  Diagnosis Date   Allergy    Anxiety    Asthma    as a child   Barrett's esophagus    Cataract    early stages left   Closed fracture of nasal septum 06/02/2016   GERD (gastroesophageal reflux disease)    Gout    never had a flare up of gout   H/O hiatal hernia    Hyperlipidemia    Hypertension    Prediabetes    Sleep apnea    does not use  cpap   Vitamin D deficiency      Past Surgical History: Past Surgical History:  Procedure Laterality Date   CLOSED REDUCTION NASAL FRACTURE N/A 06/10/2016   Procedure: CLOSED REDUCTION NASAL FRACTURE FOREHEAD SUTURE REMOVAL;  Surgeon: Peggye Form, DO;  Location: [facility];  Service: Plastics;  Laterality: N/A;   COLONOSCOPY     HEMORRHOID SURGERY  1980   Left knee Arthroscopy     Right knee arthroscopy     UPPER GASTROINTESTINAL ENDOSCOPY     last egd 12-7*2012    Allergies: No Known Allergies  Outpatient Meds: Current Outpatient Medications  Medication Sig Dispense Refill   aspirin EC 81 MG tablet Take 81 mg by mouth daily.     atenolol (TENORMIN) 100 MG tablet Take  1 tablet  Daily  for BP                                              /                           TAKE                              BY                               MOUTH 90 tablet 3   buPROPion (WELLBUTRIN XL) 300 MG 24 hr tablet Take  1 tablet  Daily for Mood, Focus & concentration                               /                               TAKE                          BY                   MOUTH 90 tablet 3   Cholecalciferol (VITAMIN D3) 125 MCG (5000 UT) CAPS Take  2 capsules (10,000 u)  every day 730 capsule 99   Cyanocobalamin (  VITAMIN B 12 PO) Take 1 tablet by mouth daily.     esomeprazole (NEXIUM) 40 MG capsule Take  1 capsule   2 x /day  to Prevent Heartburn / Barrett's Esophagitis                                         /                                                    TAKE                                            BY                                              MOUTH 180 capsule 3   fish oil-omega-3 fatty acids 1000 MG capsule Take 1 g by mouth daily.     magnesium oxide (MAG-OX) 400 MG tablet Take 400 mg by mouth 2 (two) times daily.      montelukast (SINGULAIR) 10 MG tablet Take  1 tablet  Daily  for Allergies                             /                                                   TAKE                          BY                                  MOUTH 90 tablet 3   Multiple Vitamin (MULTIVITAMIN WITH MINERALS) TABS tablet Take 1 tablet by mouth daily.     OVER THE COUNTER MEDICATION daily. Sea moss  with beet root-take 2 capsuls     potassium chloride (KLOR-CON) 10 MEQ tablet TAKE 1 TABLET BY MOUTH 3  TIMES DAILY 270 tablet 1   terazosin (HYTRIN) 2 MG capsule Take  1 capsule at Bedtime  for BP & Prostate                                         /                                TAKE                                     BY  MOUTH 90 capsule 3   torsemide (DEMADEX) 20 MG tablet Take  1 tablet  2 x /day  am & pm  for severe leg swelling 180 tablet 3   Turmeric (QC TUMERIC COMPLEX PO) Take by mouth.     valsartan (DIOVAN) 320 MG tablet Take 1 tablet Daily for BP & Diabetic Kidney Protection                                                                   /                                                  TAKE                                                BY                                                      MOUTH 90 tablet 3   vitamin C  (ASCORBIC ACID) 500 MG tablet Take 500 mg by mouth daily.      Zinc 50 MG TABS Take by mouth daily.     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 90 tablet 1   allopurinol (ZYLOPRIM) 300 MG tablet Take  1 tablet Daily to Prevent Gout                           /                                     TAKE                             BY                                     MOUTH 90 tablet 3   ALPRAZolam (XANAX) 1 MG tablet Take 1/2 - 1 tablet 2 - 3 x /day ONLY if needed for Panic or Severe Anxiety Attack &  limit to 5 days /week to avoid Addiction & Dementia                                                 /  TAKE                                  BY                                 MOUTH (Patient not taking: Reported on 03/09/2023) 30 tablet 0   ciprofloxacin (CIPRO) 250 MG tablet Take 1 tablet 2 x /day with Food for Infection (Patient not taking: Reported on 03/19/2023) 20 tablet 0   cyclobenzaprine (FLEXERIL) 10 MG tablet Take  1/2 to 1 tablet  3 x /day   as needed for Muscle Spasm 90 tablet 0   Dulaglutide (TRULICITY) 4.5 MG/0.5ML SOPN Inject 4.5 mg into skin every 7 days  for Diabetes  ( Dx: e11.29 ) 6 mL 3   EUCRISA 2 % OINT APPLY 1 APPLICATION TOPICALLY TWO TIMES A DAY (Patient taking differently: as needed.) 100 g 1   fenofibrate 160 MG tablet Take 1 tablet Daily for Triglycerides (Blood Fats)                                                          /                             TAKE                                 BY                                MOUTH 90 tablet 3   Flaxseed, Linseed, (FLAX SEEDS PO) Take by mouth.     meloxicam (MOBIC) 15 MG tablet Take 15 mg by mouth daily.     phentermine (ADIPEX-P) 37.5 MG tablet Take 1/2 to 1 tablet every Morning for Dieting & Weight Loss 90 tablet 1   testosterone cypionate (DEPOTESTOSTERONE CYPIONATE) 200 MG/ML injection INJECT 1 MILLILITER (CC) INTO THE MUSCLE ONCE A WEEK (Patient not taking: Reported  on 03/09/2023) 4 mL 2   topiramate (TOPAMAX) 100 MG tablet Take 1/2 to 1 tablet 2 x /day at Suppertime & Bedtime for Dieting & Weight Loss (Patient not taking: Reported on 03/19/2023) 180 tablet 1   traZODone (DESYREL) 150 MG tablet TAKE 1/2 TO 1 TABLET BY MOUTH 1 TO 2 HOURS BEFORE BEDTIME AS NEEDED FOR SLEEP (Patient not taking: Reported on 03/09/2023) 90 tablet 1   Vitamin D, Ergocalciferol, (DRISDOL) 1.25 MG (50000 UNIT) CAPS capsule Take by mouth. (Patient not taking: Reported on 03/19/2023)     Current Facility-Administered Medications  Medication Dose Route Frequency Provider Last Rate Last Admin   0.9 %  sodium chloride infusion  500 mL Intravenous Continuous Charlie Pitter III, MD          ___________________________________________________________________ Objective   Exam:  BP 132/81   Pulse 70   Temp (!) 96.6 F (35.9 C)   Ht 6' (1.829 m)   Wt 235 lb (106.6 kg)   SpO2 97%   BMI 31.87 kg/m  CV: regular , S1/S2 Resp: clear to auscultation bilaterally, normal RR and effort noted GI: soft, no tenderness, with active bowel sounds.   Assessment: Encounter Diagnosis  Name Primary?   Personal history of colonic polyps Yes     Plan: Colonoscopy   The benefits and risks of the planned procedure were described in detail with the patient or (when appropriate) their health care proxy.  Risks were outlined as including, but not limited to, bleeding, infection, perforation, adverse medication reaction leading to cardiac or pulmonary decompensation, pancreatitis (if ERCP).  The limitation of incomplete mucosal visualization was also discussed.  No guarantees or warranties were given.  The patient is appropriate for an endoscopic procedure in the ambulatory setting.   - Christopher Jupiter, MD

## 2023-03-19 NOTE — Progress Notes (Deleted)
Pt's states no medical or surgical changes since previsit or office visit. 

## 2023-03-19 NOTE — Progress Notes (Signed)
Pt's states no medical or surgical changes since previsit or office visit. 

## 2023-03-19 NOTE — Op Note (Signed)
[facility] Patient Name: Christopher [name] Procedure Date: 03/19/2023 2:21 PM MRN: [id number] Endoscopist: Sherilyn Cooter L. Myrtie Neither , MD, [id number] Age: 67 Referring MD:  Date of Birth: [birth year] Gender: Male Account #: [id number] Procedure:                Colonoscopy Indications:              Polyp surveillance: Personal history of sessile                            serrated colon polyp (10 mm or greater in size)                           12mm SSP without dysplasia June 2023 - fair prep                            with Suprep/Miralax 2 days prep                           poor prep on October 2020 Medicines:                Monitored Anesthesia Care Procedure:                Pre-Anesthesia Assessment:                           - Prior to the procedure, a History and Physical                            was performed, and patient medications and                            allergies were reviewed. The patient's tolerance of                            previous anesthesia was also reviewed. The risks                            and benefits of the procedure and the sedation                            options and risks were discussed with the patient.                            All questions were answered, and informed consent                            was obtained. Prior Anticoagulants: The patient has                            taken no anticoagulant or antiplatelet agents. ASA                            Grade Assessment: III - A patient with severe  systemic disease. After reviewing the risks and                            benefits, the patient was deemed in satisfactory                            condition to undergo the procedure.                           After obtaining informed consent, the colonoscope                            was passed under direct vision. Throughout the                            procedure, the patient's blood pressure, pulse, and                             oxygen saturations were monitored continuously. The                            Olympus Scope [id number] was introduced through the                            anus and advanced to the the cecum, identified by                            appendiceal orifice and ileocecal valve. The                            colonoscopy was performed with difficulty due to a                            redundant colon and significant looping. Successful                            completion of the procedure was aided by changing                            the patient to a semi-prone position, using manual                            pressure and straightening and shortening the scope                            to obtain bowel loop reduction. The patient                            tolerated the procedure well. The quality of the                            bowel preparation was good after lavage of opaque  liquid except the cecum/proximal ascending colon                            was fair with residual fibrous debris that could                            not be completely cleared. The ileocecal valve,                            appendiceal orifice, and rectum were photographed.                            The bowel preparation used was GoLYTELY via                            extended prep dose instruction. (miralax x 3 days                            followed by Ducolax/golytely split prep) Scope In: 2:36:41 PM Scope Out: 3:02:00 PM Scope Withdrawal Time: 0 hours 16 minutes 13 seconds  Total Procedure Duration: 0 hours 25 minutes 19 seconds  Findings:                 The perianal and digital rectal examinations were                            normal.                           Two semi-sessile polyps were found in the distal                            transverse colon and ascending colon. The polyps                            were diminutive in size (3-51mm). These  polyps were                            removed with a cold snare. Resection and retrieval                            were complete.                           A small post polypectomy scar was found in the                            distal transverse colon. There was no evidence of                            the previous polyp.                           The sigmoid colon was significantly redundant.  Repeat examination of right colon under NBI                            performed.                           The exam was otherwise without abnormality on                            direct and retroflexion views. Complications:            No immediate complications. Estimated Blood Loss:     Estimated blood loss was minimal. Impression:               - Two diminutive polyps in the distal transverse                            colon and in the ascending colon, removed with a                            cold snare. Resected and retrieved.                           - Post-polypectomy scar in the distal transverse                            colon.                           - Redundant colon.                           - The examination was otherwise normal on direct                            and retroflexion views. Recommendation:           - Patient has a contact number available for                            emergencies. The signs and symptoms of potential                            delayed complications were discussed with the                            patient. Return to normal activities tomorrow.                            Written discharge instructions were provided to the                            patient.                           - Resume previous diet.                           -  Continue present medications.                           - Await pathology results.                           - Repeat colonoscopy is recommended for                             surveillance. The colonoscopy date will be                            determined after pathology results from today's                            exam become available for review. For next exam,                            Suprep 2 days before procedure, then                            Ducolax/Golytely split dose prep day before/day of                            procedure. Afternoon procedure slot. Close                            attention to pre-procedure restrictions regarding                            fibrous foods. Jamilett Ferrante L. Myrtie Neither, MD 03/19/2023 3:11:32 PM This report has been signed electronically.

## 2023-03-20 ENCOUNTER — Telehealth: Payer: Self-pay | Admitting: *Deleted

## 2023-03-20 NOTE — Telephone Encounter (Signed)
  Follow up Call-     03/19/2023    1:43 PM 02/14/2022    7:26 AM  Call back number  Post procedure Call Back phone  # [phone]  Permission to leave phone message Yes Yes     Patient questions:  Do you have a fever, pain , or abdominal swelling? No. Pain Score  0 *  Have you tolerated food without any problems? Yes.    Have you been able to return to your normal activities? Yes.    Do you have any questions about your discharge instructions: Diet   No. Medications  No. Follow up visit  No.  Do you have questions or concerns about your Care? No.  Actions: * If pain score is 4 or above: No action needed, pain <4.

## 2023-03-21 ENCOUNTER — Other Ambulatory Visit: Payer: Self-pay | Admitting: Internal Medicine

## 2023-03-21 DIAGNOSIS — E6609 Other obesity due to excess calories: Secondary | ICD-10-CM

## 2023-03-21 MED ORDER — TOPIRAMATE 100 MG PO TABS
ORAL_TABLET | ORAL | 1 refills | Status: DC
Start: 2023-03-21 — End: 2023-07-20

## 2023-03-25 ENCOUNTER — Encounter: Payer: Self-pay | Admitting: Gastroenterology

## 2023-05-03 ENCOUNTER — Other Ambulatory Visit: Payer: Self-pay | Admitting: Internal Medicine

## 2023-05-03 DIAGNOSIS — I10 Essential (primary) hypertension: Secondary | ICD-10-CM

## 2023-05-03 DIAGNOSIS — N138 Other obstructive and reflux uropathy: Secondary | ICD-10-CM

## 2023-05-03 MED ORDER — TERAZOSIN HCL 2 MG PO CAPS: ORAL_CAPSULE | ORAL | 3 refills | Status: AC

## 2023-05-03 MED ORDER — ATENOLOL 100 MG PO TABS: ORAL_TABLET | ORAL | 3 refills | Status: AC

## 2023-05-03 MED ORDER — PHENTERMINE HCL 37.5 MG PO TABS
ORAL_TABLET | ORAL | 1 refills | Status: DC
Start: 2023-05-03 — End: 2023-10-20

## 2023-05-08 ENCOUNTER — Other Ambulatory Visit: Payer: Self-pay | Admitting: Internal Medicine

## 2023-05-08 DIAGNOSIS — F3341 Major depressive disorder, recurrent, in partial remission: Secondary | ICD-10-CM

## 2023-05-31 ENCOUNTER — Other Ambulatory Visit: Payer: Self-pay | Admitting: Internal Medicine

## 2023-05-31 DIAGNOSIS — E1122 Type 2 diabetes mellitus with diabetic chronic kidney disease: Secondary | ICD-10-CM

## 2023-05-31 MED ORDER — TRULICITY 4.5 MG/0.5ML ~~LOC~~ SOAJ: SUBCUTANEOUS | 3 refills | Status: AC

## 2023-06-17 ENCOUNTER — Telehealth: Payer: Self-pay | Admitting: Gastroenterology

## 2023-06-17 NOTE — Telephone Encounter (Signed)
Inbound call from patient's wife Elnita Maxwell, She wished to get results from patient's colonoscopy in July/ 2024. She does not have access per patient's DPR to retrieve any of his Information. Unable to discuss with her.

## 2023-06-18 ENCOUNTER — Ambulatory Visit (INDEPENDENT_AMBULATORY_CARE_PROVIDER_SITE_OTHER): Payer: BC Managed Care – PPO | Admitting: Internal Medicine

## 2023-06-18 ENCOUNTER — Encounter: Payer: Self-pay | Admitting: Internal Medicine

## 2023-06-18 VITALS — BP 128/78 | HR 69 | Temp 97.9°F | Resp 17 | Ht 72.0 in | Wt 220.0 lb

## 2023-06-18 DIAGNOSIS — Z23 Encounter for immunization: Secondary | ICD-10-CM | POA: Diagnosis not present

## 2023-06-18 DIAGNOSIS — E1122 Type 2 diabetes mellitus with diabetic chronic kidney disease: Secondary | ICD-10-CM

## 2023-06-18 DIAGNOSIS — M109 Gout, unspecified: Secondary | ICD-10-CM

## 2023-06-18 DIAGNOSIS — E1169 Type 2 diabetes mellitus with other specified complication: Secondary | ICD-10-CM

## 2023-06-18 DIAGNOSIS — I10 Essential (primary) hypertension: Secondary | ICD-10-CM | POA: Diagnosis not present

## 2023-06-18 DIAGNOSIS — E349 Endocrine disorder, unspecified: Secondary | ICD-10-CM

## 2023-06-18 DIAGNOSIS — Z8249 Family history of ischemic heart disease and other diseases of the circulatory system: Secondary | ICD-10-CM

## 2023-06-18 DIAGNOSIS — Z79899 Other long term (current) drug therapy: Secondary | ICD-10-CM

## 2023-06-18 DIAGNOSIS — I708 Atherosclerosis of other arteries: Secondary | ICD-10-CM

## 2023-06-18 DIAGNOSIS — E559 Vitamin D deficiency, unspecified: Secondary | ICD-10-CM | POA: Diagnosis not present

## 2023-06-18 NOTE — Patient Instructions (Signed)

## 2023-06-18 NOTE — Progress Notes (Signed)
Future Appointments  Date Time Provider Department  09/18/2023                     wellness  9:30 AM Adela Glimpse, NP [facility]  01/29/2024                     cpe 10:00 AM Lucky Cowboy, MD [facility]    History of Present Illness:       This very nice 67 y.o. [name] presents for 6 month follow up with HTN, HLD, Pre-Diabetes and Vitamin D Deficiency. Abd CT scan in 2019 showed Aorto-Iliac Atherosclerosis.       Patient is treated for HTN (2005) & BP has been controlled at home. Today's BP is at goal  -  128/78. Patient has had no complaints of any cardiac type chest pain, palpitations, dyspnea Pollyann Kennedy /PND, dizziness, claudication   or dependent edema.       Hyperlipidemia is controlled with diet & meds. Patient denies myalgias or other med SE's. Last Lipids were  at goal :  Lab Results  Component Value Date   CHOL 163 06/18/2023   HDL 50 06/18/2023   LDLCALC 96 06/18/2023   TRIG 76 06/18/2023   CHOLHDL 3.3 06/18/2023     Also, the patient has history of  Morbid Obesity ( BMI  35.53 ) and  admits compulsive overeating  with hx/o glucose intolerance /prediabetes  (A1c 5.9% /2010 and 6.0% 12 /2021 ) . Patient is felt to have diet controlled diabetes. Patient has had no symptoms of reactive hypoglycemia, diabetic polys, paresthesias or visual blurring.  Last A1c was at goal :  Lab Results  Component Value Date   HGBA1C 5.0 06/18/2023   Wt Readings from Last 3 Encounters:  06/18/23 220 lb (99.8 kg)  03/19/23 235 lb (106.6 kg)  03/13/23 231 lb 6.4 oz (105 kg)                                                     Further, the patient also has history of Vitamin D Deficiency and supplements vitamin D without any suspected side-effects. 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   allopurinol  300 MG  tablet Take  1 tablet Daily   VITAMIN C  500 mg Daily   aspirin EC   81 mg Daily   atenolol  100 MG tablet    Take  1 tablet  Daily     buPROPion XL  300 MG TAKE 1 TABLET  DAILY    VITAMIN D 5000 u Take  2 capsules (10,000 u)  every day   VITAMIN B 12  1 tablet Daily,     cyclobenzaprine  10 MG tablet Take  1/2 to 1 tablet  3 x /day   as needed   Dulaglutide (TRULICITY) 4.5 MG/0.5ML  Inject 4.5 mg into skin every 7 days   esomeprazole  40 MG capsule Take  1 capsule   2 x /day    EUCRISA 2 % OINT APPLY  TWO TIMES A DAY   fenofibrate 160 MG tablet Take  1 tablet Daily   fish oil-omega-3 fatty acids   1 g Daily   Flaxseed, Linseed, (FLAX SEEDS PO) Daily   magnesium     400 mg 2 times daily   meloxicam  15 mg Daily   montelukast   10 MG tablet Take  1 tablet  Daily    Multiple Vitamin  1 tablet  Daily   phentermine 37.5 MG tablet Take 1/2 to 1 tablet every Morning   potassium chloride  10 MEQ tablet TAKE 1 TABLET  3  TIMES DAILY   terazosin  2 MG capsule Take  1 capsule at Bedtime     Topiramate  100 MG tablet Take 1/2 to 1 tablet 2 x /day   torsemide 20 MG tablet Take  1 tablet  2 x /day  am & pm     Turmeric  Daily   valsartan  320 MG tablet Take 1 tablet Daily   Zinc 50 MG TABS Daily    No Known Allergies   PMHx:   Past Medical History:  Diagnosis Date   Allergy    Anxiety    Asthma    as a child   Barrett's esophagus    Closed fracture of nasal septum 06/02/2016   GERD (gastroesophageal reflux disease)    Gout    never had a flare up of gout   H/O hiatal hernia    Hyperlipidemia    Hypertension    Prediabetes    Sleep apnea    does not use  cpap   Vitamin D deficiency      Immunization History  Administered Date(s) Administered   Influenza Inj Mdck Quad  07/02/2017, 06/15/2018   Influenza Split 06/28/2014, 06/28/2015   Influenza,inj,Quad  06/19/2019   Influenza,inj,quad 08/11/2016   Influenza 09/01/2012   PFIZER SARS-COV-2 Vacc 10/17/2019, 11/07/2019,  09/02/2020   PPD Test 11/01/2018, 11/21/2019, 11/28/2020   Pneumococcal-23 09/01/1998   Tdap 08/01/2013   Zoster Recombinat (Shingrix) 06/19/2019, 08/23/2019     Past Surgical History:  Procedure Laterality Date   CLOSED REDUCTION NASAL FRACTURE N/A 06/10/2016   Procedure: CLOSED REDUCTION NASAL FRACTURE FOREHEAD SUTURE REMOVAL;  Surgeon: Peggye Form, DO;  Location: [facility];  Service: Plastics;  Laterality: N/A;   COLONOSCOPY     HEMORRHOID SURGERY  1980   Left knee Arthroscopy     Right knee arthroscopy     UPPER GASTROINTESTINAL ENDOSCOPY     last EGD  08-08-2011    FHx:    Reviewed / unchanged  SHx:    Reviewed / unchanged   Systems Review:  Constitutional: Denies fever, chills, wt changes, headaches, insomnia, fatigue, night sweats, change in appetite. Eyes: Denies redness, blurred vision, diplopia, discharge, itchy, watery eyes.  ENT: Denies discharge, congestion, post nasal drip, epistaxis, sore throat, earache, hearing loss, dental pain, tinnitus, vertigo, sinus pain, snoring.  CV: Denies chest pain, palpitations, irregular heartbeat, syncope, dyspnea, diaphoresis, orthopnea, PND, claudication or edema. Respiratory: denies cough, dyspnea, DOE, pleurisy, hoarseness, laryngitis, wheezing.  Gastrointestinal: Denies dysphagia, odynophagia, heartburn, reflux, water brash, abdominal pain or cramps, nausea, vomiting, bloating, diarrhea, constipation, hematemesis, melena, hematochezia  or hemorrhoids. Genitourinary: Denies dysuria, frequency, urgency, nocturia, hesitancy, discharge, hematuria or flank pain. Musculoskeletal: Denies arthralgias, myalgias, stiffness, jt. swelling, pain, limping or strain/sprain.  Skin: Denies pruritus, rash, hives, warts, acne, eczema or change in skin lesion(s). Neuro: No weakness, tremor, incoordination, spasms, paresthesia or pain. Psychiatric: Denies confusion, memory loss or sensory loss. Endo: Denies change in weight,  skin or hair change.  Heme/Lymph: No excessive bleeding, bruising or enlarged lymph nodes.  Physical Exam  BP 128/78   Pulse 69   Temp 97.9 F (36.6 C)   Resp 17   Ht 6' (1.829 m)   Wt 220 lb (99.8 kg)   SpO2 99%   BMI 29.84 kg/m   Appears  over nourished and in no distress.  Eyes: PERRLA, EOMs, conjunctiva no swelling or erythema. Sinuses: No frontal/maxillary tenderness ENT/Mouth: EAC's clear, TM's nl w/o erythema, bulging. Nares clear w/o erythema, swelling, exudates. Oropharynx clear without erythema or exudates. Oral hygiene is good. Tongue normal, non obstructing. Hearing intact.  Neck: Supple. Thyroid not palpable. Car 2+/2+ without bruits, nodes or JVD. Chest: Respirations nl with BS clear & equal w/o rales, rhonchi, wheezing or stridor.  Cor: Heart sounds normal w/ regular rate and rhythm without sig. murmurs, gallops, clicks or rubs. Peripheral pulses normal and equal  without edema.  Abdomen: Soft & bowel sounds normal. Non-tender w/o guarding, rebound, hernias, masses or organomegaly.  Lymphatics: Unremarkable.  Musculoskeletal: Full ROM all peripheral extremities, joint stability, 5/5 strength and normal gait.  Skin: Warm, dry without exposed rashes, lesions or ecchymosis apparent.  Neuro: Cranial nerves intact, reflexes equal bilaterally. Sensory-motor testing grossly intact. Tendon reflexes grossly intact.  Pysch: Alert & oriented x 3.  Insight and judgement nl & appropriate. No ideations.  Assessment and Plan:   1. Essential hypertension  - Continue medication, monitor blood pressure at home.  - Continue DASH diet.  Reminder to go to the ER if any CP,  SOB, nausea, dizziness, severe HA, changes vision/speech.     - CBC with Differential/Platelet - COMPLETE METABOLIC PANEL WITH GFR - Magnesium   2. Hyperlipidemia associated with type 2 diabetes mellitus (HCC)  - Continue diet/meds, exercise,& lifestyle modifications.  - Continue monitor periodic  cholesterol/liver & renal functions      - Lipid panel   3. Type 2 diabetes mellitus with stage 2 chronic kidney                                            disease, without long-term current use of insulin (HCC)  - Continue diet, exercise  - Lifestyle modifications.  - Monitor appropriate lab    - COMPLETE METABOLIC PANEL WITH GFR - Hemoglobin A1c - Insulin, random   4. Vitamin D deficiency  - Continue supplementation    - VITAMIN D 25 Hydroxy    5. Gout  - Uric acid   6. Morbid obesity with BMI of 45.0-49.9, adult (HCC)  - TSH   7. Testosterone deficiency  - Testosterone   8. Medication management  - CBC with Differential/Platelet - COMPLETE METABOLIC PANEL WITH GFR - Magnesium - Lipid panel - TSH - Hemoglobin A1c - Insulin, random - VITAMIN D 25 Hydroxy  - Testosterone   9. Flu vaccine need  - Flu vaccine HIGH DOSE PF  10. Aortic-Iliac Atherosclerosis  - Lipid panel         Discussed  regular exercise, BP monitoring, weight control to achieve/maintain BMI less than 25 and discussed med and SE's. Recommended labs to assess and monitor clinical status with further disposition pending results of labs.  I discussed the assessment and treatment plan with the patient. The patient was provided an opportunity to ask questions and all were answered. The patient agreed with the  plan and demonstrated an understanding of the instructions.  I provided over 30 minutes of exam, counseling, chart review and  complex critical decision making.        The patient was advised to call back or seek an in-person evaluation if the symptoms worsen or if the condition fails to improve as anticipated.   Marinus Maw, MD

## 2023-06-19 LAB — COMPLETE METABOLIC PANEL WITH GFR
AG Ratio: 2 (calc) (ref 1.0–2.5)
ALT: 14 U/L (ref 9–46)
AST: 18 U/L (ref 10–35)
Albumin: 4.3 g/dL (ref 3.6–5.1)
Alkaline phosphatase (APISO): 47 U/L (ref 35–144)
BUN: 14 mg/dL (ref 7–25)
CO2: 25 mmol/L (ref 20–32)
Calcium: 9.1 mg/dL (ref 8.6–10.3)
Chloride: 108 mmol/L (ref 98–110)
Creat: 1.2 mg/dL (ref 0.70–1.35)
Globulin: 2.1 g/dL (ref 1.9–3.7)
Glucose, Bld: 81 mg/dL (ref 65–99)
Potassium: 3.8 mmol/L (ref 3.5–5.3)
Sodium: 140 mmol/L (ref 135–146)
Total Bilirubin: 0.6 mg/dL (ref 0.2–1.2)
Total Protein: 6.4 g/dL (ref 6.1–8.1)
eGFR: 67 mL/min/{1.73_m2} (ref >=60)

## 2023-06-19 LAB — CBC WITH DIFFERENTIAL/PLATELET
Absolute Lymphocytes: 1841 {cells}/uL (ref 850–3900)
Absolute Monocytes: 629 {cells}/uL (ref 200–950)
Basophils Absolute: 21 {cells}/uL (ref 0–200)
Basophils Relative: 0.4 %
Eosinophils Absolute: 99 {cells}/uL (ref 15–500)
Eosinophils Relative: 1.9 %
HCT: 35.9 % — ABNORMAL LOW (ref 38.5–50.0)
Hemoglobin: 12.1 g/dL — ABNORMAL LOW (ref 13.2–17.1)
MCH: 32.1 pg (ref 27.0–33.0)
MCHC: 33.7 g/dL (ref 32.0–36.0)
MCV: 95.2 fL (ref 80.0–100.0)
MPV: 11 fL (ref 7.5–12.5)
Monocytes Relative: 12.1 %
Neutro Abs: 2610 {cells}/uL (ref 1500–7800)
Neutrophils Relative %: 50.2 %
Platelets: 106 Thousand/uL — ABNORMAL LOW (ref 140–400)
RBC: 3.77 Million/uL — ABNORMAL LOW (ref 4.20–5.80)
RDW: 12.3 % (ref 11.0–15.0)
Total Lymphocyte: 35.4 %
WBC: 5.2 Thousand/uL (ref 3.8–10.8)

## 2023-06-19 LAB — LIPID PANEL
Cholesterol: 163 mg/dL
HDL: 50 mg/dL
LDL Cholesterol (Calc): 96 mg/dL
Non-HDL Cholesterol (Calc): 113 mg/dL
Total CHOL/HDL Ratio: 3.3 (calc)
Triglycerides: 76 mg/dL

## 2023-06-19 LAB — HEMOGLOBIN A1C
Hgb A1c MFr Bld: 5 %{Hb}
Mean Plasma Glucose: 97 mg/dL
eAG (mmol/L): 5.4 mmol/L

## 2023-06-19 LAB — TSH: TSH: 1.56 m[IU]/L (ref 0.40–4.50)

## 2023-06-19 LAB — INSULIN, RANDOM: Insulin: 5.7 u[IU]/mL

## 2023-06-19 LAB — VITAMIN D 25 HYDROXY (VIT D DEFICIENCY, FRACTURES): Vit D, 25-Hydroxy: 70 ng/mL (ref 30–100)

## 2023-06-19 LAB — MAGNESIUM: Magnesium: 1.8 mg/dL (ref 1.5–2.5)

## 2023-06-19 LAB — TESTOSTERONE: Testosterone: 504 ng/dL (ref 250–827)

## 2023-06-19 LAB — URIC ACID: Uric Acid, Serum: 7.3 mg/dL (ref 4.0–8.0)

## 2023-06-20 NOTE — Progress Notes (Signed)
<>*<>*<>*<>*<>*<>*<>*<>*<>*<>*<>*<>*<>*<>*<>*<>*<>*<>*<>*<>*<>*<>*<>*<>*<> <>*<>*<>*<>*<>*<>*<>*<>*<>*<>*<>*<>*<>*<>*<>*<>*<>*<>*<>*<>*<>*<>*<>*<>*<>  -  Test results slightly outside the reference range are not unusual. If there is anything important, I will review this with you,  otherwise it is considered normal test values.  If you have further questions,  please do not hesitate to contact me at the office or via My Chart.   <>*<>*<>*<>*<>*<>*<>*<>*<>*<>*<>*<>*<>*<>*<>*<>*<>*<>*<>*<>*<>*<>*<>*<>*<> <>*<>*<>*<>*<>*<>*<>*<>*<>*<>*<>*<>*<>*<>*<>*<>*<>*<>*<>*<>*<>*<>*<>*<>*<>  -   Magnesium  = 1.8  and is very  low - goal is betw 2.0 - 2.5,   - So..............Marland Kitchen  Recommend that you take                                     Magnesium 500 mg tablet  3 x/ day with Meals   - also important to eat lots of  leafy green vegetables   - spinach - Kale - collards - greens - okra - asparagus - broccoli - quinoa   - squash - almonds - black, red, white beans  -  peas - green beans  <>*<>*<>*<>*<>*<>*<>*<>*<>*<>*<>*<>*<>*<>*<>*<>*<>*<>*<>*<>*<>*<>*<>*<>*<> <>*<>*<>*<>*<>*<>*<>*<>*<>*<>*<>*<>*<>*<>*<>*<>*<>*<>*<>*<>*<>*<>*<>*<>*<>  -  Chol = 1653   - Excellent   - Very low risk for Heart Attack  / Stroke  <>*<>*<>*<>*<>*<>*<>*<>*<>*<>*<>*<>*<>*<>*<>*<>*<>*<>*<>*<>*<>*<>*<>*<>*<> <>*<>*<>*<>*<>*<>*<>*<>*<>*<>*<>*<>*<>*<>*<>*<>*<>*<>*<>*<>*<>*<>*<>*<>*<>  -  A1c - Normal - No Diabetes  - Great  !  <>*<>*<>*<>*<>*<>*<>*<>*<>*<>*<>*<>*<>*<>*<>*<>*<>*<>*<>*<>*<>*<>*<>*<>*<> <>*<>*<>*<>*<>*<>*<>*<>*<>*<>*<>*<>*<>*<>*<>*<>*<>*<>*<>*<>*<>*<>*<>*<>*<>  -  Vitamin D = 70 - Great  - Please continue dosage same   <>*<>*<>*<>*<>*<>*<>*<>*<>*<>*<>*<>*<>*<>*<>*<>*<>*<>*<>*<>*<>*<>*<>*<>*<> <>*<>*<>*<>*<>*<>*<>*<>*<>*<>*<>*<>*<>*<>*<>*<>*<>*<>*<>*<>*<>*<>*<>*<>*<>  -  Uric Acid / Gout test is Normal - Continue Allopurinol    <>*<>*<>*<>*<>*<>*<>*<>*<>*<>*<>*<>*<>*<>*<>*<>*<>*<>*<>*<>*<>*<>*<>*<>*<> <>*<>*<>*<>*<>*<>*<>*<>*<>*<>*<>*<>*<>*<>*<>*<>*<>*<>*<>*<>*<>*<>*<>*<>*<>  -  Testosterone level is Normal   <>*<>*<>*<>*<>*<>*<>*<>*<>*<>*<>*<>*<>*<>*<>*<>*<>*<>*<>*<>*<>*<>*<>*<>*<> <>*<>*<>*<>*<>*<>*<>*<>*<>*<>*<>*<>*<>*<>*<>*<>*<>*<>*<>*<>*<>*<>*<>*<>*<>  -  All Else - CBC - Kidneys - Electrolytes - Liver - Magnesium & Thyroid    - all  Normal / OK  <>*<>*<>*<>*<>*<>*<>*<>*<>*<>*<>*<>*<>*<>*<>*<>*<>*<>*<>*<>*<>*<>*<>*<>*<> <>*<>*<>*<>*<>*<>*<>*<>*<>*<>*<>*<>*<>*<>*<>*<>*<>*<>*<>*<>*<>*<>*<>*<>*<>  - Please share your Randie Heinz results with your lovely wife -   <>*<>*<>*<>*<>*<>*<>*<>*<>*<>*<>*<>*<>*<>*<>*<>*<>*<>*<>*<>*<>*<>*<>*<>*<> <>*<>*<>*<>*<>*<>*<>*<>*<>*<>*<>*<>*<>*<>*<>*<>*<>*<>*<>*<>*<>*<>*<>*<>*<>

## 2023-06-21 ENCOUNTER — Encounter: Payer: Self-pay | Admitting: Internal Medicine

## 2023-06-26 ENCOUNTER — Ambulatory Visit: Payer: BC Managed Care – PPO | Admitting: Internal Medicine

## 2023-07-20 ENCOUNTER — Other Ambulatory Visit: Payer: Self-pay | Admitting: Internal Medicine

## 2023-07-20 DIAGNOSIS — E66812 Obesity, class 2: Secondary | ICD-10-CM

## 2023-09-18 ENCOUNTER — Other Ambulatory Visit: Payer: Self-pay | Admitting: Nurse Practitioner

## 2023-09-18 ENCOUNTER — Ambulatory Visit: Payer: 59 | Admitting: Nurse Practitioner

## 2023-09-18 DIAGNOSIS — I872 Venous insufficiency (chronic) (peripheral): Secondary | ICD-10-CM

## 2023-09-30 ENCOUNTER — Ambulatory Visit: Payer: 59 | Admitting: Nurse Practitioner

## 2023-10-20 ENCOUNTER — Other Ambulatory Visit: Payer: Self-pay

## 2023-10-20 MED ORDER — PHENTERMINE HCL 37.5 MG PO TABS: ORAL_TABLET | ORAL | 1 refills | Status: AC

## 2023-12-18 ENCOUNTER — Encounter: Payer: 59 | Admitting: Nurse Practitioner

## 2024-01-29 ENCOUNTER — Encounter: Payer: 59 | Admitting: Internal Medicine
